# Patient Record
Sex: Male | Born: 1955 | Race: White | Hispanic: No | Marital: Single | Smoking: Current every day smoker
Health system: Southern US, Community
[De-identification: ages and names within clinical notes are randomized; demographics above are authoritative.]

## PROBLEM LIST (undated history)

## (undated) ENCOUNTER — Emergency Department (HOSPITAL_COMMUNITY): Admission: EM | Payer: Self-pay | Source: Home / Self Care

## (undated) DIAGNOSIS — J449 Chronic obstructive pulmonary disease, unspecified: Secondary | ICD-10-CM

## (undated) DIAGNOSIS — E079 Disorder of thyroid, unspecified: Secondary | ICD-10-CM

## (undated) DIAGNOSIS — F419 Anxiety disorder, unspecified: Secondary | ICD-10-CM

## (undated) DIAGNOSIS — B182 Chronic viral hepatitis C: Secondary | ICD-10-CM

## (undated) DIAGNOSIS — F329 Major depressive disorder, single episode, unspecified: Secondary | ICD-10-CM

## (undated) DIAGNOSIS — F32A Depression, unspecified: Secondary | ICD-10-CM

## (undated) DIAGNOSIS — F1020 Alcohol dependence, uncomplicated: Secondary | ICD-10-CM

## (undated) DIAGNOSIS — M419 Scoliosis, unspecified: Secondary | ICD-10-CM

## (undated) DIAGNOSIS — F99 Mental disorder, not otherwise specified: Secondary | ICD-10-CM

## (undated) DIAGNOSIS — Z8719 Personal history of other diseases of the digestive system: Secondary | ICD-10-CM

## (undated) HISTORY — PX: SKIN CANCER EXCISION: SHX779

## (undated) HISTORY — PX: THYROID SURGERY: SHX805

---

## 1998-12-22 ENCOUNTER — Emergency Department (HOSPITAL_COMMUNITY): Admission: EM | Admit: 1998-12-22 | Discharge: 1998-12-22 | Payer: Self-pay | Admitting: Emergency Medicine

## 1998-12-22 ENCOUNTER — Encounter: Payer: Self-pay | Admitting: Emergency Medicine

## 1999-03-16 ENCOUNTER — Emergency Department (HOSPITAL_COMMUNITY): Admission: EM | Admit: 1999-03-16 | Discharge: 1999-03-16 | Payer: Self-pay

## 2000-01-25 ENCOUNTER — Emergency Department (HOSPITAL_COMMUNITY): Admission: EM | Admit: 2000-01-25 | Discharge: 2000-01-25 | Payer: Self-pay | Admitting: Emergency Medicine

## 2000-01-25 ENCOUNTER — Encounter: Payer: Self-pay | Admitting: Emergency Medicine

## 2000-01-27 ENCOUNTER — Encounter: Payer: Self-pay | Admitting: Emergency Medicine

## 2000-01-27 ENCOUNTER — Emergency Department (HOSPITAL_COMMUNITY): Admission: EM | Admit: 2000-01-27 | Discharge: 2000-01-27 | Payer: Self-pay | Admitting: Emergency Medicine

## 2000-06-09 ENCOUNTER — Emergency Department (HOSPITAL_COMMUNITY): Admission: EM | Admit: 2000-06-09 | Discharge: 2000-06-09 | Payer: Self-pay | Admitting: Emergency Medicine

## 2000-06-09 ENCOUNTER — Encounter: Payer: Self-pay | Admitting: Emergency Medicine

## 2001-06-14 ENCOUNTER — Ambulatory Visit (HOSPITAL_COMMUNITY): Admission: RE | Admit: 2001-06-14 | Discharge: 2001-06-14 | Payer: Self-pay | Admitting: Family Medicine

## 2001-06-14 ENCOUNTER — Encounter: Payer: Self-pay | Admitting: Family Medicine

## 2002-07-17 ENCOUNTER — Ambulatory Visit (HOSPITAL_COMMUNITY): Admission: RE | Admit: 2002-07-17 | Discharge: 2002-07-17 | Payer: Self-pay | Admitting: Surgery

## 2002-07-17 ENCOUNTER — Encounter: Payer: Self-pay | Admitting: Surgery

## 2002-08-29 ENCOUNTER — Ambulatory Visit (HOSPITAL_COMMUNITY): Admission: RE | Admit: 2002-08-29 | Discharge: 2002-08-30 | Payer: Self-pay | Admitting: Surgery

## 2002-08-29 ENCOUNTER — Encounter: Payer: Self-pay | Admitting: Surgery

## 2002-08-29 ENCOUNTER — Encounter (INDEPENDENT_AMBULATORY_CARE_PROVIDER_SITE_OTHER): Payer: Self-pay | Admitting: Specialist

## 2003-02-15 ENCOUNTER — Encounter: Payer: Self-pay | Admitting: Emergency Medicine

## 2003-02-15 ENCOUNTER — Emergency Department (HOSPITAL_COMMUNITY): Admission: EM | Admit: 2003-02-15 | Discharge: 2003-02-15 | Payer: Self-pay | Admitting: Emergency Medicine

## 2003-02-26 ENCOUNTER — Emergency Department (HOSPITAL_COMMUNITY): Admission: EM | Admit: 2003-02-26 | Discharge: 2003-02-27 | Payer: Self-pay | Admitting: Emergency Medicine

## 2003-02-26 ENCOUNTER — Encounter: Payer: Self-pay | Admitting: Emergency Medicine

## 2003-05-17 ENCOUNTER — Encounter: Payer: Self-pay | Admitting: Emergency Medicine

## 2003-05-17 ENCOUNTER — Emergency Department (HOSPITAL_COMMUNITY): Admission: EM | Admit: 2003-05-17 | Discharge: 2003-05-17 | Payer: Self-pay | Admitting: Emergency Medicine

## 2004-04-24 ENCOUNTER — Emergency Department (HOSPITAL_COMMUNITY): Admission: EM | Admit: 2004-04-24 | Discharge: 2004-04-24 | Payer: Self-pay | Admitting: Emergency Medicine

## 2005-06-03 ENCOUNTER — Inpatient Hospital Stay (HOSPITAL_COMMUNITY): Admission: EM | Admit: 2005-06-03 | Discharge: 2005-06-07 | Payer: Self-pay | Admitting: Psychiatry

## 2005-06-03 ENCOUNTER — Ambulatory Visit: Payer: Self-pay | Admitting: Psychiatry

## 2005-06-14 ENCOUNTER — Emergency Department (HOSPITAL_COMMUNITY): Admission: EM | Admit: 2005-06-14 | Discharge: 2005-06-14 | Payer: Self-pay | Admitting: *Deleted

## 2006-02-15 ENCOUNTER — Emergency Department (HOSPITAL_COMMUNITY): Admission: EM | Admit: 2006-02-15 | Discharge: 2006-02-15 | Payer: Self-pay | Admitting: Emergency Medicine

## 2006-04-12 ENCOUNTER — Emergency Department (HOSPITAL_COMMUNITY): Admission: EM | Admit: 2006-04-12 | Discharge: 2006-04-12 | Payer: Self-pay | Admitting: Emergency Medicine

## 2006-06-07 ENCOUNTER — Emergency Department (HOSPITAL_COMMUNITY): Admission: EM | Admit: 2006-06-07 | Discharge: 2006-06-07 | Payer: Self-pay | Admitting: Emergency Medicine

## 2008-05-12 ENCOUNTER — Emergency Department (HOSPITAL_COMMUNITY): Admission: EM | Admit: 2008-05-12 | Discharge: 2008-05-13 | Payer: Self-pay | Admitting: Emergency Medicine

## 2008-07-18 ENCOUNTER — Emergency Department (HOSPITAL_COMMUNITY): Admission: EM | Admit: 2008-07-18 | Discharge: 2008-07-18 | Payer: Self-pay | Admitting: Emergency Medicine

## 2009-02-27 ENCOUNTER — Ambulatory Visit: Payer: Self-pay | Admitting: Diagnostic Radiology

## 2009-02-27 ENCOUNTER — Emergency Department (HOSPITAL_BASED_OUTPATIENT_CLINIC_OR_DEPARTMENT_OTHER): Admission: EM | Admit: 2009-02-27 | Discharge: 2009-02-27 | Payer: Self-pay | Admitting: Emergency Medicine

## 2009-11-23 ENCOUNTER — Emergency Department (HOSPITAL_COMMUNITY): Admission: EM | Admit: 2009-11-23 | Discharge: 2009-11-23 | Payer: Self-pay | Admitting: Emergency Medicine

## 2010-04-26 ENCOUNTER — Ambulatory Visit: Payer: Self-pay | Admitting: Psychiatry

## 2010-04-26 ENCOUNTER — Inpatient Hospital Stay (HOSPITAL_COMMUNITY): Admission: AD | Admit: 2010-04-26 | Discharge: 2010-04-30 | Payer: Self-pay | Admitting: Psychiatry

## 2010-04-27 ENCOUNTER — Ambulatory Visit (HOSPITAL_COMMUNITY): Admission: RE | Admit: 2010-04-27 | Discharge: 2010-04-27 | Payer: Self-pay | Admitting: Psychiatry

## 2010-05-07 ENCOUNTER — Encounter: Payer: Self-pay | Admitting: Emergency Medicine

## 2010-05-07 ENCOUNTER — Inpatient Hospital Stay (HOSPITAL_COMMUNITY): Admission: EM | Admit: 2010-05-07 | Discharge: 2010-05-10 | Payer: Self-pay | Admitting: Emergency Medicine

## 2010-08-25 ENCOUNTER — Emergency Department (HOSPITAL_COMMUNITY): Admission: EM | Admit: 2010-08-25 | Discharge: 2010-08-26 | Payer: Self-pay

## 2010-09-23 ENCOUNTER — Emergency Department (HOSPITAL_COMMUNITY): Admission: EM | Admit: 2010-09-23 | Discharge: 2010-04-26 | Payer: Self-pay | Admitting: Emergency Medicine

## 2010-11-08 ENCOUNTER — Emergency Department (HOSPITAL_COMMUNITY)
Admission: EM | Admit: 2010-11-08 | Discharge: 2010-11-09 | Payer: Self-pay | Source: Home / Self Care | Admitting: Emergency Medicine

## 2010-11-09 ENCOUNTER — Inpatient Hospital Stay (HOSPITAL_COMMUNITY)
Admission: AD | Admit: 2010-11-09 | Discharge: 2010-11-15 | Payer: Self-pay | Source: Home / Self Care | Attending: Psychiatry | Admitting: Psychiatry

## 2010-11-09 LAB — COMPREHENSIVE METABOLIC PANEL
Albumin: 3.8 g/dL (ref 3.5–5.2)
BUN: 10 mg/dL (ref 6–23)
Chloride: 106 mEq/L (ref 96–112)
Creatinine, Ser: 1.01 mg/dL (ref 0.4–1.5)
GFR calc Af Amer: >60 mL/min (ref >60)
GFR calc non Af Amer: >60 mL/min (ref >60)
Glucose, Bld: 92 mg/dL (ref 70–99)
Total Bilirubin: 0.7 mg/dL (ref 0.3–1.2)
Total Protein: 6 g/dL (ref 6.0–8.3)

## 2010-11-09 LAB — URINALYSIS, ROUTINE W REFLEX MICROSCOPIC
Bilirubin Urine: NEGATIVE
Ketones, ur: NEGATIVE mg/dL
Specific Gravity, Urine: 1.008 (ref 1.005–1.030)
Urine Glucose, Fasting: NEGATIVE mg/dL
pH: 5.5 (ref 5.0–8.0)

## 2010-11-09 LAB — CBC
HCT: 45.7 % (ref 39.0–52.0)
MCH: 31.6 pg (ref 26.0–34.0)
MCV: 91.4 fL (ref 78.0–100.0)
RBC: 5 MIL/uL (ref 4.22–5.81)

## 2010-11-09 LAB — DIFFERENTIAL
Basophils Relative: 1 % (ref 0–1)
Eosinophils Absolute: 0.1 10*3/uL (ref 0.0–0.7)
Eosinophils Relative: 2 % (ref 0–5)
Lymphocytes Relative: 31 % (ref 12–46)
Lymphs Abs: 2.7 10*3/uL (ref 0.7–4.0)
Monocytes Relative: 4 % (ref 3–12)
Neutro Abs: 5.6 10*3/uL (ref 1.7–7.7)
Neutrophils Relative %: 63 % (ref 43–77)

## 2010-11-09 LAB — RAPID URINE DRUG SCREEN, HOSP PERFORMED
Amphetamines: NOT DETECTED
Barbiturates: NOT DETECTED
Benzodiazepines: NOT DETECTED
Opiates: NOT DETECTED

## 2010-11-15 NOTE — H&P (Signed)
Tim [name], Tim [name]               ACCOUNT NO.:  [id number]  MEDICAL RECORD NO.:  [id number]          PATIENT TYPE:  IPS  LOCATION:  0303                          [facility]ACILITY[facility]  [facility]  PHYSICIAN:  Eulogio Ditch, MD DATE OF BIRTH:  1956/05/08  DATE OF ADMISSION:  11/09/2010 DATE OF DISCHARGE:                      PSYCHIATRIC ADMISSION ASSESSMENT   HISTORY OF PRESENT ILLNESS:  The patient presents to the emergency department with complaints of suicidal thoughts and having a plan to run in front of a car, has also been drinking, states he has cut down on his drinking, drinking now only a couple times a week.  He states that he feels very hopeless.  He is lonely, has no social skills.  Feels nonproductive but he is currently unemployed.  Denies any hallucinations with last drink day of arrival.  PAST PSYCHIATRIC HISTORY:  The patient was here in July 2011 for suicidal thoughts and cocaine use.  Has been on Wellbutrin in the past, reports a side effect of feeling suicidal on medication.  Was a client of the [facility].  SOCIAL HISTORY:  This is a 55 year old single male who lives in [place].  He is currently unemployed, has a DUI and received a DUI in July and has a court date this coming April.  FAMILY HISTORY:  None.  ALCOHOL/DRUG HISTORY:  As above.  Drinking as he states a couple times a week with a blood alcohol of 97.  Denies any seizures or blackouts and has a history of cocaine use but none currently.  Reports occasional marijuana use.  PRIMARY CARE PROVIDER:  Unknown.  MEDICAL PROBLEMS:  A history of chronic obstructive pulmonary disease and positive hepatitis C.  MEDICATIONS: 1. Recently on Prozac 20 mg daily. 2. Advil p.r.n. for pain.  DRUG ALLERGIES:  No known allergies.  Physical exam was done at the emergency department.  This is a normally- developed, disheveled middle-aged male.  He appears in no distress.  No tremors were noted.  Of note, in the  physical exam, there was deformity and swelling over the right [facility] joint, pain with abduction of right arm but still full range of motion.  Neurovascular is intact.  LABORATORY DATA:  Shows a urinalysis that was negative and urine drug screen negative.  Alcohol level of 97.  CMP within normal limits.  CBC within normal limits.    The patient was seen in conjunction with Eulogio Ditch, MD  MENTAL STATUS EXAM:  The patient is fully alert and cooperative, good eye contact, disheveled and appears in no distress.  His speech is clear.  He provides a good history as to his circumstances.  Thought processes are coherent and goal directed.  The patient already has plans to make himself better, to stop drinking and to be compliant with his medications.  Cognitive function intact.  His memory appears intact. Judgment and insight appear good.  AXIS I:  Depressive disorder, rule out substance use, mood disorder. Alcohol abuse, rule out dependence. AXIS II:  Deferred. AXIS III:  History of chronic obstructive pulmonary disease, hepatitis C. AXIS IV:  Problems with occupation, psychosocial problems related to loneliness, medical problems.  AXIS V:  Current is 30.  Our plan is to have Librium available on a short-term basis.  Monitor withdrawal symptoms.  We will continue patient's Prozac and we may need to titrate to optimize symptoms.  Assess the patient's motivation for rehabilitation.  The patient at this time was open to the [facility] and we will encourage group activity.  Currently length of stay of 4-5 days.     Landry Corporal, N.P.   ______________________________ Eulogio Ditch, MD    JO/MEDQ  D:  11/10/2010  T:  11/10/2010  Job:  [id number]  Electronically Signed by Limmie PatriciaP. on 11/15/2010 03:47:28 PM Electronically Signed by Eulogio Ditch  on 11/15/2010 06:24:06 PM

## 2010-11-15 NOTE — Discharge Summary (Signed)
  Tim [name], Tim [name]               ACCOUNT NO.:  [id number]  MEDICAL RECORD NO.:  [id number]          PATIENT TYPE:  IPS  LOCATION:  0303                          [facility]ACILITY:  [facility]  PHYSICIAN:  Eulogio Ditch, MD DATE OF BIRTH:  [birth year]  DATE OF ADMISSION:  11/09/2010 DATE OF DISCHARGE:  11/15/2010                              DISCHARGE SUMMARY   HISTORY OF PRESENT ILLNESS:  A 55 year old male who was admitted because of alcohol abuse.  The patient also reported depressed mood, suicidal thoughts and was feeling hopeless.  He reportedly loneliness.  The patient denied any psychotic or manic symptoms at the time of admission.  PAST PSYCHIATRIC HISTORY:  The patient has been admitted to [facility] in the past in July 2011 for suicidal thoughts and cocaine abuse. The patient also follows at [facility].  SOCIAL HISTORY:  Single male, he lives in [place].  Unemployed.  FAMILY PSYCHIATRIC HISTORY:  None.  ALCOHOL/DRUG ABUSE HISTORY:  The patient has a history of alcohol abuse. At the time of admission, blood alcohol level 97.  Denied any seizure or blackouts.  History of cocaine abuse, but not using currently. Occasional use of marijuana.  MEDICAL PROBLEMS: 1. COPD positive. 2. Hepatitis C.  HOSPITAL COURSE:  During the hospital stay, the patient was started on Librium protocol.  The patient tolerated the protocol well without any side effects.  The patient was taking Prozac in the past, so Prozac 20 mg p.o. daily during the hospital stay was continued.  On Monday, November 15, 2010 when I saw the patient, the patient reported no withdrawal symptoms, feeling much better, not having any suicidal or homicidal ideations.  Was not having psychotic or manic symptoms.  As the patient was psychiatrically stable, he was discharged to follow up in the outpatient setting.  DISCHARGE DIAGNOSES: 1. Polysubstance abuse. 2. Depressive disorder not otherwise  specified. 3. Rule out substance-induced depression. 4. Rule out major depressive disorder recurrent type.  DISCHARGE MEDICATIONS: 1. Prozac 20 mg p.o. daily. 2. The patient was advised to continue his regular medical     medications.  DISCHARGE FOLLOWUP: 1. The patient will follow with [facility], phone number [phone], appointment November 16, 2010     at 11 a.m. 2. The patient with follow with [facility], phone     number [phone].  The patient will call to schedule an appointment.     Eulogio Ditch, MD     SA/MEDQ  D:  11/15/2010  T:  11/15/2010  Job:  [id number]  Electronically Signed by Eulogio Ditch  on 11/15/2010 06:24:11 PM

## 2010-12-28 LAB — DIFFERENTIAL
Basophils Absolute: 0.1 10*3/uL (ref 0.0–0.1)
Eosinophils Absolute: 0.1 10*3/uL (ref 0.0–0.7)
Eosinophils Relative: 1 % (ref 0–5)
Monocytes Absolute: 0.5 10*3/uL (ref 0.1–1.0)

## 2010-12-28 LAB — RAPID URINE DRUG SCREEN, HOSP PERFORMED
Cocaine: NOT DETECTED
Opiates: NOT DETECTED

## 2010-12-28 LAB — BASIC METABOLIC PANEL
Calcium: 9.5 mg/dL (ref 8.4–10.5)
Creatinine, Ser: 1.13 mg/dL (ref 0.4–1.5)
GFR calc Af Amer: >60 mL/min (ref >60)
Sodium: 145 mEq/L (ref 135–145)

## 2010-12-28 LAB — CBC
Hemoglobin: 16 g/dL (ref 13.0–17.0)
MCH: 32.8 pg (ref 26.0–34.0)
MCV: 95.4 fL (ref 78.0–100.0)
RBC: 4.87 MIL/uL (ref 4.22–5.81)

## 2010-12-28 LAB — TRICYCLICS SCREEN, URINE: TCA Scrn: NOT DETECTED

## 2011-01-01 LAB — BENZODIAZEPINE, QUANTITATIVE, URINE
Flurazepam GC/MS Conf: NEGATIVE NG/ML
Lorazepam UR QT: 579 NG/ML — ABNORMAL HIGH
Temazepam GC/MS Conf: NEGATIVE NG/ML

## 2011-01-01 LAB — GLUCOSE, CAPILLARY
Glucose-Capillary: 73 mg/dL (ref 70–99)
Glucose-Capillary: 80 mg/dL (ref 70–99)
Glucose-Capillary: 145 mg/dL — ABNORMAL HIGH (ref 70–99)
Glucose-Capillary: 73 mg/dL (ref 70–99)

## 2011-01-01 LAB — BASIC METABOLIC PANEL
BUN: 13 mg/dL (ref 6–23)
BUN: 3 mg/dL — ABNORMAL LOW (ref 6–23)
CO2: 24 mEq/L (ref 19–32)
CO2: 26 mEq/L (ref 19–32)
Calcium: 8.4 mg/dL (ref 8.4–10.5)
Calcium: 8.7 mg/dL (ref 8.4–10.5)
Calcium: 8.9 mg/dL (ref 8.4–10.5)
Creatinine, Ser: 0.83 mg/dL (ref 0.4–1.5)
Creatinine, Ser: 0.97 mg/dL (ref 0.4–1.5)
GFR calc non Af Amer: >60 mL/min (ref >60)
Glucose, Bld: 120 mg/dL — ABNORMAL HIGH (ref 70–99)
Glucose, Bld: 140 mg/dL — ABNORMAL HIGH (ref 70–99)
Glucose, Bld: 175 mg/dL — ABNORMAL HIGH (ref 70–99)
Potassium: 4 mEq/L (ref 3.5–5.1)
Sodium: 136 mEq/L (ref 135–145)

## 2011-01-01 LAB — CBC
HCT: 40.9 % (ref 39.0–52.0)
HCT: 42.5 % (ref 39.0–52.0)
Hemoglobin: 14.1 g/dL (ref 13.0–17.0)
MCH: 32.6 pg (ref 26.0–34.0)
MCHC: 33.9 g/dL (ref 30.0–36.0)
MCHC: 34.3 g/dL (ref 30.0–36.0)
Platelets: 226 10*3/uL (ref 150–400)
RDW: 13.4 % (ref 11.5–15.5)

## 2011-01-01 LAB — URINE DRUGS OF ABUSE SCREEN W ALC, ROUTINE (REF LAB)
Amphetamine Screen, Ur: NEGATIVE
Benzodiazepines.: POSITIVE — AB
Creatinine,U: 132.5 mg/dL
Propoxyphene: NEGATIVE

## 2011-01-01 LAB — ETHANOL: Alcohol, Ethyl (B): 232 mg/dL — ABNORMAL HIGH (ref 0–10)

## 2011-01-01 LAB — POCT I-STAT, CHEM 8
BUN: 15 mg/dL (ref 6–23)
Calcium, Ion: 1.05 mmol/L — ABNORMAL LOW (ref 1.12–1.32)
Chloride: 108 meq/L (ref 96–112)
Creatinine, Ser: 1.4 mg/dL (ref 0.4–1.5)
Glucose, Bld: 58 mg/dL — ABNORMAL LOW (ref 70–99)
HCT: 48 % (ref 39.0–52.0)
Hemoglobin: 16.3 g/dL (ref 13.0–17.0)
Potassium: 3.2 meq/L — ABNORMAL LOW (ref 3.5–5.1)
Sodium: 138 meq/L (ref 135–145)
TCO2: 18 mmol/L (ref 0–100)

## 2011-01-01 LAB — ETHANOL CONFIRM, URINE: Ethanol, Ur - Confirmation: 0.025 GMS% (ref ?–0.04)

## 2011-01-01 LAB — OPIATE, QUANTITATIVE, URINE
Codeine Urine: NEGATIVE NG/ML
Hydrocodone: NEGATIVE NG/ML
Morphine, Confirm: 1257 NG/ML — ABNORMAL HIGH

## 2011-01-02 LAB — HEPATIC FUNCTION PANEL
ALT: 21 U/L (ref 0–53)
AST: 29 U/L (ref 0–37)
Bilirubin, Direct: 0.1 mg/dL (ref 0.0–0.3)
Total Bilirubin: 0.2 mg/dL — ABNORMAL LOW (ref 0.3–1.2)

## 2011-01-02 LAB — BASIC METABOLIC PANEL WITH GFR
BUN: 9 mg/dL (ref 6–23)
CO2: 23 meq/L (ref 19–32)
Calcium: 9.1 mg/dL (ref 8.4–10.5)
Chloride: 108 meq/L (ref 96–112)
Creatinine, Ser: 1.11 mg/dL (ref 0.4–1.5)
GFR calc non Af Amer: >60 mL/min
Glucose, Bld: 93 mg/dL (ref 70–99)
Potassium: 3.8 meq/L (ref 3.5–5.1)
Sodium: 138 meq/L (ref 135–145)

## 2011-01-02 LAB — CBC
HCT: 46.8 % (ref 39.0–52.0)
Hemoglobin: 16.1 g/dL (ref 13.0–17.0)
MCH: 32.7 pg (ref 26.0–34.0)
MCHC: 34.5 g/dL (ref 30.0–36.0)
MCV: 94.8 fL (ref 78.0–100.0)
Platelets: 207 K/uL (ref 150–400)
RBC: 4.93 MIL/uL (ref 4.22–5.81)
RDW: 13.3 % (ref 11.5–15.5)
WBC: 6.1 K/uL (ref 4.0–10.5)

## 2011-01-02 LAB — DIFFERENTIAL
Basophils Relative: 1 % (ref 0–1)
Eosinophils Absolute: 0.2 10*3/uL (ref 0.0–0.7)
Lymphs Abs: 2.3 10*3/uL (ref 0.7–4.0)
Neutro Abs: 3.3 10*3/uL (ref 1.7–7.7)
Neutrophils Relative %: 54 % (ref 43–77)

## 2011-01-02 LAB — RAPID URINE DRUG SCREEN, HOSP PERFORMED
Barbiturates: NOT DETECTED
Cocaine: POSITIVE — AB
Opiates: NOT DETECTED

## 2011-02-03 ENCOUNTER — Emergency Department (HOSPITAL_COMMUNITY)
Admission: EM | Admit: 2011-02-03 | Discharge: 2011-02-04 | Disposition: A | Discharge status: Home / Self Care | Payer: Self-pay | Attending: Emergency Medicine | Admitting: Emergency Medicine

## 2011-02-03 DIAGNOSIS — M25519 Pain in unspecified shoulder: Secondary | ICD-10-CM | POA: Insufficient documentation

## 2011-02-03 DIAGNOSIS — F329 Major depressive disorder, single episode, unspecified: Secondary | ICD-10-CM | POA: Insufficient documentation

## 2011-02-03 DIAGNOSIS — G8929 Other chronic pain: Secondary | ICD-10-CM | POA: Insufficient documentation

## 2011-02-03 DIAGNOSIS — F3289 Other specified depressive episodes: Secondary | ICD-10-CM | POA: Insufficient documentation

## 2011-02-03 DIAGNOSIS — R45851 Suicidal ideations: Secondary | ICD-10-CM | POA: Insufficient documentation

## 2011-02-03 DIAGNOSIS — J449 Chronic obstructive pulmonary disease, unspecified: Secondary | ICD-10-CM | POA: Insufficient documentation

## 2011-02-03 DIAGNOSIS — M549 Dorsalgia, unspecified: Secondary | ICD-10-CM | POA: Insufficient documentation

## 2011-02-03 DIAGNOSIS — J4489 Other specified chronic obstructive pulmonary disease: Secondary | ICD-10-CM | POA: Insufficient documentation

## 2011-02-03 DIAGNOSIS — B1920 Unspecified viral hepatitis C without hepatic coma: Secondary | ICD-10-CM | POA: Insufficient documentation

## 2011-02-03 LAB — RAPID URINE DRUG SCREEN, HOSP PERFORMED
Opiates: NOT DETECTED
Tetrahydrocannabinol: NOT DETECTED

## 2011-02-03 LAB — DIFFERENTIAL
Lymphocytes Relative: 20 % (ref 12–46)
Lymphs Abs: 1.6 10*3/uL (ref 0.7–4.0)
Neutrophils Relative %: 75 % (ref 43–77)

## 2011-02-03 LAB — COMPREHENSIVE METABOLIC PANEL
ALT: 20 U/L (ref 0–53)
Alkaline Phosphatase: 70 U/L (ref 39–117)
CO2: 23 mEq/L (ref 19–32)
Chloride: 106 mEq/L (ref 96–112)
GFR calc non Af Amer: >60 mL/min (ref >60)
Glucose, Bld: 92 mg/dL (ref 70–99)
Potassium: 3.9 mEq/L (ref 3.5–5.1)
Sodium: 139 mEq/L (ref 135–145)

## 2011-02-03 LAB — CBC
HCT: 44.4 % (ref 39.0–52.0)
MCV: 90.8 fL (ref 78.0–100.0)
RBC: 4.89 MIL/uL (ref 4.22–5.81)
WBC: 7.8 10*3/uL (ref 4.0–10.5)

## 2011-02-04 DIAGNOSIS — F1020 Alcohol dependence, uncomplicated: Secondary | ICD-10-CM
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
## 2011-02-07 ENCOUNTER — Emergency Department (HOSPITAL_COMMUNITY)
Admission: EM | Admit: 2011-02-07 | Discharge: 2011-02-08 | Disposition: A | Discharge status: Home / Self Care | Payer: Self-pay | Attending: Emergency Medicine | Admitting: Emergency Medicine

## 2011-02-07 DIAGNOSIS — F1010 Alcohol abuse, uncomplicated: Secondary | ICD-10-CM | POA: Insufficient documentation

## 2011-02-07 DIAGNOSIS — F341 Dysthymic disorder: Secondary | ICD-10-CM | POA: Insufficient documentation

## 2011-02-07 DIAGNOSIS — F1910 Other psychoactive substance abuse, uncomplicated: Secondary | ICD-10-CM | POA: Insufficient documentation

## 2011-02-07 DIAGNOSIS — Z8619 Personal history of other infectious and parasitic diseases: Secondary | ICD-10-CM | POA: Insufficient documentation

## 2011-02-08 ENCOUNTER — Emergency Department (HOSPITAL_COMMUNITY): Payer: Self-pay

## 2011-02-08 LAB — COMPREHENSIVE METABOLIC PANEL
ALT: 17 U/L (ref 0–53)
AST: 19 U/L (ref 0–37)
BUN: 9 mg/dL (ref 6–23)
GFR calc non Af Amer: >60 mL/min (ref >60)
Potassium: 3.6 mEq/L (ref 3.5–5.1)
Total Bilirubin: 0.3 mg/dL (ref 0.3–1.2)

## 2011-02-08 LAB — RAPID URINE DRUG SCREEN, HOSP PERFORMED: Barbiturates: NOT DETECTED

## 2011-02-08 LAB — DIFFERENTIAL
Basophils Relative: 1 % (ref 0–1)
Eosinophils Absolute: 0.1 10*3/uL (ref 0.0–0.7)
Monocytes Relative: 3 % (ref 3–12)
Neutrophils Relative %: 72 % (ref 43–77)

## 2011-02-08 LAB — CBC
MCH: 32.1 pg (ref 26.0–34.0)
Platelets: 225 10*3/uL (ref 150–400)
RBC: 4.92 MIL/uL (ref 4.22–5.81)

## 2011-02-08 LAB — ETHANOL: Alcohol, Ethyl (B): 147 mg/dL — ABNORMAL HIGH (ref 0–10)
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
## 2011-03-04 NOTE — Op Note (Signed)
NAME:  Tim [name], Tim [name]                         ACCOUNT NO.:  [id number]   MEDICAL RECORD NO.:  [id number]                   PATIENT TYPE:  OIB   LOCATION:  2550                                 FACILITY:  [facility]   PHYSICIAN:  Velora Heckler, M.D.                DATE OF BIRTH:  14-Jun-1956   DATE OF PROCEDURE:  08/29/2002  DATE OF DISCHARGE:                                 OPERATIVE REPORT   PREOPERATIVE DIAGNOSIS:  Primary hyperparathyroidism.   POSTOPERATIVE DIAGNOSIS:  Primary hyperparathyroidism.   OPERATION PERFORMED:  Minimally invasive parathyroidectomy (right inferior  gland).   SURGEON:  Velora Heckler, M.D.   ASSISTANT:  Abigail Miyamoto, M.D.   ANESTHESIA:  General.   ESTIMATED BLOOD LOSS:  Minimal.   PREPARATION:  Betadine.   COMPLICATIONS:  None.   INDICATIONS FOR PROCEDURE:  The patient is a 55 year old white male who  presents at the request of Dr. Barton Fanny from [facility] for evaluation of primary hyperparathyroidism.  Routine laboratory  studies have shown elevated serum calcium levels from 12.1 to 12.8.  Intact  PTH level was elevated at 96.  Sestamibi scan in nuclear medicine  demonstrated increased uptake in the right inferior position consistent with  right inferior parathyroid adenoma.  The patient now comes to surgery for  minimally invasive parathyroidectomy.   DESCRIPTION OF PROCEDURE:  The procedure was done in operating room #2  at  the [facility]. [facility].  The patient was brought to the  operating room and placed in supine position on the operating room table.  Following administration of general anesthesia, the patient was positioned  and then prepped and draped in the usual strict aseptic fashion.  After  ascertaining that an adequate level of anesthesia had been obtained,  the  neck was scanned with the Neoprobe.  A point of increased uptake was noted  in the right inferior neck.  This was marked with a  marking pen.  A 3 cm  incision was then made with a #15 blade and carried down through the  platysma.  Hemostasis was obtained with the electrocautery.  Skin flaps were  developed cephalad and caudad.  The midline was identified and incised.  Strap muscles were reflected laterally.  The thyroid gland was rotated  medially.  Beginning dissection at the inferior pole of the thyroid, the  Neoprobe was used to localize the increased radioactivity.  The inferior  parathyroid gland was then identified.  Vascular tributaries were divided  between small and medium Ligaclips.  The gland was rolled up and out of the  tracheoesophageal groove.  The gland appears to be connected to the  thyrothymic tract.  This was transected and suture ligated with a 3-0 Vicryl  suture ligature.  The gland was completely excised.  Ex vivo scanning shows  it to have increased radioactivity of approximately 80% of background  levels.  The gland measures approximately 1.5 cm in greatest dimension.  Gland was submitted fresh to pathology where a frozen section analysis by  Dr. Tammi Sou confirmed parathyroid tissue consistent with parathyroid  adenoma.  Good hemostasis was noted.  A small piece of Surgicel was placed  in the tracheoesophageal groove in the right neck.  Strap muscles were  reapproximated in the midline with interrupted 3-0 sutures.  The platysma  was reapproximated with interrupted 3-0 Vicryl sutures.  Skin edges were  anesthetized with 0.5% Marcaine local anesthetic.  Skin edges were  reapproximated with interrupted 4-0 Vicryl subcuticular sutures.  The wound  was washed and dried and benzoin and Steri-Strips were applied.  Sterile  gauze dressings were applied.  The patient was awakened from anesthesia and  brought to the recovery room in stable condition.  The patient tolerated the  procedure well.                                                 Velora Heckler, M.D.    TMG/MEDQ  D:   08/29/2002  T:  08/29/2002  Job:  [id number]   cc:   Fanny Dance. Rankins, M.D.  [address] [place]  [place] [address]  Fax: [phone]

## 2011-03-04 NOTE — Discharge Summary (Signed)
NAMEHARLIN, Tim [name]               ACCOUNT NO.:  [id number]   MEDICAL RECORD NO.:  [id number]          PATIENT TYPE:  IPS   LOCATION:  0500                          FACILITY:  [facility]   PHYSICIAN:  Anselm Jungling, MD  DATE OF BIRTH:  27-Jul-1956   DATE OF ADMISSION:  06/03/2005  DATE OF DISCHARGE:  06/07/2005                                 DISCHARGE SUMMARY   IDENTIFYING DATA AND REASON FOR ADMISSION:  This was a 55 year old Caucasian  male admitted for chronic alcoholism, marijuana and cocaine abuse.  He had  also been significantly depressed, with weight loss.  Please refer to the  admission note for further details pertaining to the symptoms,  circumstances, and history that led to his hospitalization at [facility].   INITIAL DIAGNOSTIC IMPRESSION:  AXIS I:  Polysubstance abuse/dependence,  depressive disorder not otherwise specified, and rule out substance-induced  mood disorder.  AXIS II:  Deferred.  AXIS III:  No acute or chronic illnesses.  AXIS IV:  Stressors, severe.  AXIS V:  Global assessment of function on admission 35.   MEDICAL AND LABORATORY:  There were no significant medical issues during  this brief inpatient psychiatric stay.  The patient's admission CBC and  chemistry panel were essentially within normal limits, although total  protein and albumin were slightly under range.  A TSH level was within  normal limits.  An RPR was nonreactive.  A GC/CT probe of urine was  negative.   HOSPITAL COURSE:  The patient was admitted to the adult inpatient  psychiatric service.  He was placed on a regimen that included his usual  Lexapro 10 mg daily.  In addition, he was placed on an increasing schedule  of Seroquel, 100 mg q.h.s., to be increased daily by 100 mg.  This was well  tolerated.  At the time of discharge he was up to Seroquel 300 mg q.h.s.  which was perceived by the patient and staff to be helpful.   The patient was discharged on the fifth hospital day.  At that time  he was  in good spirits, and absent of any suicidal ideation.  He was agreeable to  discharge at that time.   AFTERCARE PLANS:  The patient was discharged on Lexapro 10 mg daily and  Seroquel 300 mg q.h.s.  He was to follow up with Dr. Lang Snow at the [facility] on June 09, 2005.   DISCHARGE DIAGNOSES:  AXIS I:  Polysubstance abuse and depressive disorder  not otherwise specified.  AXIS II:  Deferred.  AXIS III:  No acute or chronic illnesses.  AXIS IV:  Stressors, severe.  AXIS V:  Global assessment of function on discharge 55.           ______________________________  Anselm Jungling, MD  Electronically Signed     SPB/MEDQ  D:  06/30/2005  T:  06/30/2005  Job:  [id number]

## 2011-05-27 ENCOUNTER — Emergency Department (HOSPITAL_COMMUNITY)
Admission: EM | Admit: 2011-05-27 | Discharge: 2011-05-28 | Disposition: A | Discharge status: Other Acute Inpatient Hospital | Payer: Self-pay | Attending: Emergency Medicine | Admitting: Emergency Medicine

## 2011-05-27 DIAGNOSIS — M549 Dorsalgia, unspecified: Secondary | ICD-10-CM | POA: Insufficient documentation

## 2011-05-27 DIAGNOSIS — F3289 Other specified depressive episodes: Secondary | ICD-10-CM | POA: Insufficient documentation

## 2011-05-27 DIAGNOSIS — F329 Major depressive disorder, single episode, unspecified: Secondary | ICD-10-CM | POA: Insufficient documentation

## 2011-05-27 DIAGNOSIS — R45851 Suicidal ideations: Secondary | ICD-10-CM | POA: Insufficient documentation

## 2011-05-27 LAB — COMPREHENSIVE METABOLIC PANEL
ALT: 16 U/L (ref 0–53)
Calcium: 9.8 mg/dL (ref 8.4–10.5)
GFR calc Af Amer: >60 mL/min (ref >60)
Glucose, Bld: 83 mg/dL (ref 70–99)
Sodium: 139 mEq/L (ref 135–145)
Total Protein: 7.9 g/dL (ref 6.0–8.3)

## 2011-05-27 LAB — URINALYSIS, ROUTINE W REFLEX MICROSCOPIC
Bilirubin Urine: NEGATIVE
Glucose, UA: NEGATIVE mg/dL
Hgb urine dipstick: NEGATIVE
Ketones, ur: NEGATIVE mg/dL
Leukocytes, UA: NEGATIVE
pH: 5 (ref 5.0–8.0)

## 2011-05-27 LAB — CBC
HCT: 45.5 % (ref 39.0–52.0)
Hemoglobin: 16.2 g/dL (ref 13.0–17.0)
MCH: 33.1 pg (ref 26.0–34.0)
MCV: 92.9 fL (ref 78.0–100.0)
RBC: 4.9 MIL/uL (ref 4.22–5.81)

## 2011-05-27 LAB — ETHANOL: Alcohol, Ethyl (B): 218 mg/dL — ABNORMAL HIGH (ref 0–11)

## 2011-05-27 LAB — RAPID URINE DRUG SCREEN, HOSP PERFORMED
Amphetamines: NOT DETECTED
Benzodiazepines: NOT DETECTED
Tetrahydrocannabinol: POSITIVE — AB

## 2011-05-27 LAB — DIFFERENTIAL
Lymphocytes Relative: 29 % (ref 12–46)
Lymphs Abs: 2.1 10*3/uL (ref 0.7–4.0)
Monocytes Absolute: 0.4 10*3/uL (ref 0.1–1.0)
Monocytes Relative: 5 % (ref 3–12)
Neutro Abs: 4.4 10*3/uL (ref 1.7–7.7)
Neutrophils Relative %: 59 % (ref 43–77)

## 2011-05-28 ENCOUNTER — Inpatient Hospital Stay (HOSPITAL_COMMUNITY)
Admission: AD | Admit: 2011-05-28 | Discharge: 2011-06-02 | DRG: 897 | Disposition: A | Discharge status: Home / Self Care | Payer: Self-pay | Source: Ambulatory Visit | Attending: Psychiatry | Admitting: Psychiatry

## 2011-05-28 DIAGNOSIS — F329 Major depressive disorder, single episode, unspecified: Secondary | ICD-10-CM

## 2011-05-28 DIAGNOSIS — F1020 Alcohol dependence, uncomplicated: Principal | ICD-10-CM

## 2011-05-28 DIAGNOSIS — F1994 Other psychoactive substance use, unspecified with psychoactive substance-induced mood disorder: Secondary | ICD-10-CM

## 2011-05-28 DIAGNOSIS — J4489 Other specified chronic obstructive pulmonary disease: Secondary | ICD-10-CM

## 2011-05-28 DIAGNOSIS — F1910 Other psychoactive substance abuse, uncomplicated: Secondary | ICD-10-CM

## 2011-05-28 DIAGNOSIS — M549 Dorsalgia, unspecified: Secondary | ICD-10-CM

## 2011-05-28 DIAGNOSIS — R45851 Suicidal ideations: Secondary | ICD-10-CM

## 2011-05-28 DIAGNOSIS — J449 Chronic obstructive pulmonary disease, unspecified: Secondary | ICD-10-CM

## 2011-05-28 DIAGNOSIS — F17200 Nicotine dependence, unspecified, uncomplicated: Secondary | ICD-10-CM

## 2011-05-28 DIAGNOSIS — Z733 Stress, not elsewhere classified: Secondary | ICD-10-CM

## 2011-05-28 DIAGNOSIS — F3289 Other specified depressive episodes: Secondary | ICD-10-CM

## 2011-05-28 DIAGNOSIS — B1920 Unspecified viral hepatitis C without hepatic coma: Secondary | ICD-10-CM
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
## 2011-07-14 ENCOUNTER — Emergency Department (HOSPITAL_COMMUNITY)
Admission: EM | Admit: 2011-07-14 | Discharge: 2011-07-14 | Disposition: A | Discharge status: Home / Self Care | Payer: Self-pay | Attending: Emergency Medicine | Admitting: Emergency Medicine

## 2011-07-14 DIAGNOSIS — F1010 Alcohol abuse, uncomplicated: Secondary | ICD-10-CM | POA: Insufficient documentation

## 2011-07-14 DIAGNOSIS — F3289 Other specified depressive episodes: Secondary | ICD-10-CM | POA: Insufficient documentation

## 2011-07-14 DIAGNOSIS — Z139 Encounter for screening, unspecified: Secondary | ICD-10-CM | POA: Insufficient documentation

## 2011-07-14 DIAGNOSIS — F411 Generalized anxiety disorder: Secondary | ICD-10-CM | POA: Insufficient documentation

## 2011-07-14 DIAGNOSIS — R45851 Suicidal ideations: Secondary | ICD-10-CM | POA: Insufficient documentation

## 2011-07-14 DIAGNOSIS — F329 Major depressive disorder, single episode, unspecified: Secondary | ICD-10-CM | POA: Insufficient documentation

## 2011-07-14 DIAGNOSIS — R45850 Homicidal ideations: Secondary | ICD-10-CM | POA: Insufficient documentation

## 2011-07-14 LAB — CBC
MCH: 31.5 pg (ref 26.0–34.0)
MCV: 89.2 fL (ref 78.0–100.0)
Platelets: 224 10*3/uL (ref 150–400)
RDW: 13.2 % (ref 11.5–15.5)

## 2011-07-14 LAB — DIFFERENTIAL
Eosinophils Absolute: 1 10*3/uL — ABNORMAL HIGH (ref 0.0–0.7)
Eosinophils Relative: 11 % — ABNORMAL HIGH (ref 0–5)
Lymphs Abs: 2.8 10*3/uL (ref 0.7–4.0)
Monocytes Relative: 4 % (ref 3–12)

## 2011-07-14 LAB — COMPREHENSIVE METABOLIC PANEL
AST: 21 U/L (ref 0–37)
Albumin: 3.8 g/dL (ref 3.5–5.2)
Calcium: 9.5 mg/dL (ref 8.4–10.5)
Creatinine, Ser: 1.01 mg/dL (ref 0.50–1.35)
GFR calc non Af Amer: >60 mL/min (ref >60)

## 2011-07-14 LAB — RAPID URINE DRUG SCREEN, HOSP PERFORMED
Benzodiazepines: NOT DETECTED
Cocaine: NOT DETECTED

## 2011-07-22 ENCOUNTER — Encounter: Payer: Self-pay | Admitting: *Deleted

## 2011-07-22 ENCOUNTER — Emergency Department (HOSPITAL_COMMUNITY)
Admission: EM | Admit: 2011-07-22 | Discharge: 2011-07-22 | Disposition: A | Discharge status: Home / Self Care | Payer: Self-pay | Attending: Emergency Medicine | Admitting: Emergency Medicine

## 2011-07-22 DIAGNOSIS — F1010 Alcohol abuse, uncomplicated: Secondary | ICD-10-CM

## 2011-07-22 DIAGNOSIS — R45850 Homicidal ideations: Secondary | ICD-10-CM | POA: Insufficient documentation

## 2011-07-22 DIAGNOSIS — T148XXA Other injury of unspecified body region, initial encounter: Secondary | ICD-10-CM

## 2011-07-22 DIAGNOSIS — IMO0002 Reserved for concepts with insufficient information to code with codable children: Secondary | ICD-10-CM | POA: Insufficient documentation

## 2011-07-22 DIAGNOSIS — F17200 Nicotine dependence, unspecified, uncomplicated: Secondary | ICD-10-CM | POA: Insufficient documentation

## 2011-07-22 HISTORY — DX: Chronic viral hepatitis C: B18.2

## 2011-07-22 HISTORY — DX: Chronic obstructive pulmonary disease, unspecified: J44.9

## 2011-07-22 LAB — ETHANOL: Alcohol, Ethyl (B): 213 mg/dL — ABNORMAL HIGH (ref 0–11)

## 2011-07-22 NOTE — ED Notes (Signed)
Pt up to bathroom.
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
## 2011-07-22 NOTE — ED Notes (Signed)
Cleaned patient's wounds
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
## 2011-10-15 ENCOUNTER — Encounter (HOSPITAL_COMMUNITY): Payer: Self-pay | Admitting: Emergency Medicine

## 2011-10-15 ENCOUNTER — Emergency Department (HOSPITAL_COMMUNITY)
Admission: EM | Admit: 2011-10-15 | Discharge: 2011-10-15 | Disposition: A | Discharge status: Home / Self Care | Payer: Self-pay | Attending: Emergency Medicine | Admitting: Emergency Medicine

## 2011-10-15 DIAGNOSIS — F10929 Alcohol use, unspecified with intoxication, unspecified: Secondary | ICD-10-CM

## 2011-10-15 DIAGNOSIS — F1010 Alcohol abuse, uncomplicated: Secondary | ICD-10-CM | POA: Insufficient documentation

## 2011-10-15 DIAGNOSIS — F17200 Nicotine dependence, unspecified, uncomplicated: Secondary | ICD-10-CM | POA: Insufficient documentation

## 2011-10-15 DIAGNOSIS — Z8619 Personal history of other infectious and parasitic diseases: Secondary | ICD-10-CM | POA: Insufficient documentation

## 2011-10-15 LAB — BASIC METABOLIC PANEL
BUN: 16 mg/dL (ref 6–23)
CO2: 20 mEq/L (ref 19–32)
Chloride: 106 mEq/L (ref 96–112)
GFR calc non Af Amer: 74 mL/min — ABNORMAL LOW (ref >90)
Glucose, Bld: 71 mg/dL (ref 70–99)
Potassium: 3.7 mEq/L (ref 3.5–5.1)
Sodium: 140 mEq/L (ref 135–145)

## 2011-10-15 LAB — CBC
HCT: 43.2 % (ref 39.0–52.0)
Hemoglobin: 15 g/dL (ref 13.0–17.0)
MCHC: 34.7 g/dL (ref 30.0–36.0)
RBC: 4.91 MIL/uL (ref 4.22–5.81)

## 2011-10-15 NOTE — ED Notes (Addendum)
Pt asleep. Appears to be comfortable. Pt calm and resting.
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
## 2011-10-15 NOTE — ED Notes (Signed)
[id number]<GL> Expected date:10/15/11<BR> Expected time: 2:13 AM<BR> Means of arrival:Ambulance<BR> Comments:<BR> Etoh, pysche evaluation
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
## 2011-10-15 NOTE — ED Notes (Signed)
4 packs of cigarettes, 1 lighter, 1 cell phone,  1 pair of jeans, 1 jacket, 1 wallet, 1 pair of underwear, 2 shirts, 1 pair of socks, 1 pair of boots, 1 cell phone case, 1 glove. 2 bags of belongings located behind nurses station.

## 2011-10-15 NOTE — ED Notes (Signed)
Pt. In blue scrubs, red socks. pt. And belongings have both be wanded by security.

## 2011-10-18 ENCOUNTER — Encounter (HOSPITAL_COMMUNITY): Payer: Self-pay | Admitting: Emergency Medicine

## 2011-10-18 ENCOUNTER — Emergency Department (HOSPITAL_COMMUNITY)
Admission: EM | Admit: 2011-10-18 | Discharge: 2011-10-18 | Disposition: A | Discharge status: Home / Self Care | Payer: Self-pay | Attending: Emergency Medicine | Admitting: Emergency Medicine

## 2011-10-18 DIAGNOSIS — Z79899 Other long term (current) drug therapy: Secondary | ICD-10-CM | POA: Insufficient documentation

## 2011-10-18 DIAGNOSIS — J449 Chronic obstructive pulmonary disease, unspecified: Secondary | ICD-10-CM | POA: Insufficient documentation

## 2011-10-18 DIAGNOSIS — F1020 Alcohol dependence, uncomplicated: Secondary | ICD-10-CM | POA: Insufficient documentation

## 2011-10-18 DIAGNOSIS — F919 Conduct disorder, unspecified: Secondary | ICD-10-CM | POA: Insufficient documentation

## 2011-10-18 DIAGNOSIS — F17200 Nicotine dependence, unspecified, uncomplicated: Secondary | ICD-10-CM | POA: Insufficient documentation

## 2011-10-18 DIAGNOSIS — J4489 Other specified chronic obstructive pulmonary disease: Secondary | ICD-10-CM | POA: Insufficient documentation

## 2011-10-18 DIAGNOSIS — IMO0002 Reserved for concepts with insufficient information to code with codable children: Secondary | ICD-10-CM

## 2011-10-18 DIAGNOSIS — Z8619 Personal history of other infectious and parasitic diseases: Secondary | ICD-10-CM | POA: Insufficient documentation

## 2011-10-18 HISTORY — DX: Mental disorder, not otherwise specified: F99

## 2011-10-18 NOTE — ED Notes (Signed)
Patient states he is here voluntarily to be admitted for a psychiatric evaluation. States he is not a violent person but is having violent thoughts. States he has been going through "highs and lows and does not know what to do". Denies suicidal ideations or homicidal ideations.
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
## 2011-10-18 NOTE — ED Notes (Signed)
Per night shift nurse, pt has discharge paper work. Is to be discharged home past eating breakfast

## 2011-10-18 NOTE — ED Provider Notes (Signed)
History     CSN: [id number]  Arrival date & time 10/18/11  4782   First MD Initiated Contact with Patient 10/18/11 [phone]      Chief Complaint  Patient presents with  . Psychiatric Evaluation    (Consider location/radiation/quality/duration/timing/severity/associated sxs/prior treatment) HPI Comments: 56 year old male with a history of COPD, hepatitis C, mental health disorders, states that he has had a whole life time of not being able to make good decisions. He states that this is been a chronic problem and the only time that he ever does well is when someone tells him what to do all the time. When he makes his own decisions he says that he "always makes the wrong ones" he denies feeling physically bad but admits to intermittent depression. He denies suicidal thoughts, homicidal thoughts and has no violent feelings towards himself or anyone. He is currently receiving treatment for his mental health problems at the [facility] in [place] and has been treated at [facility] in the past several months. He does have chronic alcoholism and admits to drinking 3-24 ounce beers yesterday and smoking marijuana. He denies crack cocaine, heroin.  The history is provided by the patient and medical records.    Past Medical History  Diagnosis Date  . COPD (chronic obstructive pulmonary disease)   . Hep C w/o coma, chronic   . Mental health disorder     Past Surgical History  Procedure Date  . Thyroid surgery   . Skin cancer excision     History reviewed. No pertinent family history.  History  Substance Use Topics  . Smoking status: Current Everyday Smoker -- 1.0 packs/day for 40 years    Types: Cigarettes  . Smokeless tobacco: Not on file  . Alcohol Use: Yes      Review of Systems  All other systems reviewed and are negative.    Allergies  Review of patient's allergies indicates no known allergies.  Home Medications   Current Outpatient Rx  Name Route Sig Dispense Refill    . FLUOXETINE HCL 40 MG PO CAPS Oral Take 40 mg by mouth daily.      Marland Kitchen HYDROXYZINE PAMOATE 25 MG PO CAPS Oral Take 25 mg by mouth 3 (three) times daily as needed.        BP 102/68  Pulse 64  Temp(Src) 97.9 F (36.6 C) (Oral)  Resp 18  Ht 6\' 3"  (1.905 m)  Wt 189 lb (85.73 kg)  BMI 23.62 kg/m2  SpO2 100%  Physical Exam  Nursing note and vitals reviewed. Constitutional: He appears well-developed and well-nourished. No distress.  HENT:  Head: Normocephalic and atraumatic.  Mouth/Throat: Oropharynx is clear and moist. No oropharyngeal exudate.  Eyes: Conjunctivae and EOM are normal. Pupils are equal, round, and reactive to light. Right eye exhibits no discharge. Left eye exhibits no discharge. No scleral icterus.  Neck: Normal range of motion. Neck supple. No JVD present. No thyromegaly present.  Cardiovascular: Normal rate, regular rhythm, normal heart sounds and intact distal pulses.  Exam reveals no gallop and no friction rub.   No murmur heard. Pulmonary/Chest: Effort normal and breath sounds normal. No respiratory distress. He has no wheezes. He has no rales.  Abdominal: Soft. Bowel sounds are normal. He exhibits no distension and no mass. There is no tenderness.  Musculoskeletal: Normal range of motion. He exhibits no edema and no tenderness.  Lymphadenopathy:    He has no cervical adenopathy.  Neurological: He is alert. Coordination normal.  Skin: Skin  is warm and dry. No rash noted. No erythema.  Psychiatric: He has a normal mood and affect. His behavior is normal.    ED Course  Procedures (including critical care time)  Labs Reviewed - No data to display No results found.   1. Behavioral disorder       MDM  Patient appears in good health in good spirits, he is able to smile and lap and states that he still finds in joint and in certain things in life. He denies suicidal thoughts, hallucinations or any violent behavior or thoughts. Patient would be stable for  discharge and followup with psychiatry as an outpatient. He is already aware and has been seen in the past by local psychiatrist with [facility] as well as psychiatrists at the [facility]  Patient is well-appearing, he does not pose a threat to himself or others and is willing to followup with [facility]. Phone numbers given, questions answered.      Vida Roller, MD 10/18/11 [phone]

## 2012-01-26 ENCOUNTER — Encounter (HOSPITAL_COMMUNITY): Payer: Self-pay | Admitting: *Deleted

## 2012-01-26 ENCOUNTER — Emergency Department (HOSPITAL_COMMUNITY)
Admission: EM | Admit: 2012-01-26 | Discharge: 2012-01-27 | Disposition: A | Discharge status: Rehabilitation Facility | Payer: Self-pay | Attending: Emergency Medicine | Admitting: Emergency Medicine

## 2012-01-26 DIAGNOSIS — B182 Chronic viral hepatitis C: Secondary | ICD-10-CM | POA: Insufficient documentation

## 2012-01-26 DIAGNOSIS — F1410 Cocaine abuse, uncomplicated: Secondary | ICD-10-CM | POA: Insufficient documentation

## 2012-01-26 DIAGNOSIS — F17200 Nicotine dependence, unspecified, uncomplicated: Secondary | ICD-10-CM | POA: Insufficient documentation

## 2012-01-26 DIAGNOSIS — F1210 Cannabis abuse, uncomplicated: Secondary | ICD-10-CM | POA: Insufficient documentation

## 2012-01-26 DIAGNOSIS — J4489 Other specified chronic obstructive pulmonary disease: Secondary | ICD-10-CM | POA: Insufficient documentation

## 2012-01-26 DIAGNOSIS — J449 Chronic obstructive pulmonary disease, unspecified: Secondary | ICD-10-CM | POA: Insufficient documentation

## 2012-01-26 DIAGNOSIS — F1010 Alcohol abuse, uncomplicated: Secondary | ICD-10-CM | POA: Insufficient documentation

## 2012-01-26 LAB — DIFFERENTIAL
Basophils Relative: 1 % (ref 0–1)
Eosinophils Absolute: 0.3 10*3/uL (ref 0.0–0.7)
Eosinophils Relative: 3 % (ref 0–5)
Lymphs Abs: 2.8 10*3/uL (ref 0.7–4.0)
Neutrophils Relative %: 59 % (ref 43–77)

## 2012-01-26 LAB — URINALYSIS, ROUTINE W REFLEX MICROSCOPIC
Glucose, UA: NEGATIVE mg/dL
Hgb urine dipstick: NEGATIVE
Leukocytes, UA: NEGATIVE
Specific Gravity, Urine: 1.014 (ref 1.005–1.030)
pH: 5 (ref 5.0–8.0)

## 2012-01-26 LAB — CBC
MCH: 31.2 pg (ref 26.0–34.0)
MCHC: 35 g/dL (ref 30.0–36.0)
MCV: 89 fL (ref 78.0–100.0)
Platelets: 238 10*3/uL (ref 150–400)
RBC: 4.62 MIL/uL (ref 4.22–5.81)

## 2012-01-26 NOTE — ED Provider Notes (Signed)
History     CSN: [id number]  Arrival date & time 01/26/12  2320   First MD Initiated Contact with Patient 01/26/12 2349      Chief Complaint  Patient presents with  . Psychiatric Evaluation    (Consider location/radiation/quality/duration/timing/severity/associated sxs/prior treatment) HPI Comments: 56 year old alcoholic male who presents with request for alcohol detox, the patient also has a history of hepatitis C and COPD. He states he's been drinking daily for many months, has been in and out of detoxification facilities with minimal improvement and contributes this to his living situation around other people who drink heavily and use drugs. He denies any depression or suicidality, currently lives with his mother but feels that he may inherited some of his alcoholic tendencies from his deceased father who was also an alcoholic. He states that he does not have alcohol withdrawal seizures and has never had delirium tremens. He does get shaky when he doesn't drink.  His last drink was approximately 2 hours prior to arrival and states he drank approximately 100 ounces of beer today  The history is provided by the patient and medical records.    Past Medical History  Diagnosis Date  . COPD (chronic obstructive pulmonary disease)   . Hep C w/o coma, chronic   . Mental health disorder     Past Surgical History  Procedure Date  . Thyroid surgery   . Skin cancer excision     No family history on file.  History  Substance Use Topics  . Smoking status: Current Everyday Smoker -- 1.0 packs/day for 40 years    Types: Cigarettes  . Smokeless tobacco: Not on file  . Alcohol Use: Yes      Review of Systems  Constitutional: Negative for fever and chills.  HENT: Negative for sore throat and neck pain.   Eyes: Negative for visual disturbance.  Respiratory: Negative for cough and shortness of breath.   Cardiovascular: Negative for chest pain.  Gastrointestinal: Negative for nausea,  vomiting, abdominal pain and diarrhea.  Genitourinary: Negative for dysuria and frequency.  Musculoskeletal: Negative for back pain.  Skin: Negative for rash.  Neurological: Negative for weakness, numbness and headaches.  Hematological: Negative for adenopathy.  Psychiatric/Behavioral: Negative for behavioral problems.    Allergies  Review of patient's allergies indicates no known allergies.  Home Medications   Current Outpatient Rx  Name Route Sig Dispense Refill  . FLUOXETINE HCL 40 MG PO CAPS Oral Take 40 mg by mouth daily.      Marland Kitchen HYDROXYZINE PAMOATE 25 MG PO CAPS Oral Take 25 mg by mouth 3 (three) times daily as needed.        BP 92/65  Pulse 77  Temp(Src) 98.1 F (36.7 C) (Oral)  Resp 16  SpO2 93%  Physical Exam  Nursing note and vitals reviewed. Constitutional: He appears well-developed and well-nourished. No distress.  HENT:  Head: Normocephalic and atraumatic.  Mouth/Throat: Oropharynx is clear and moist. No oropharyngeal exudate.  Eyes: Conjunctivae and EOM are normal. Pupils are equal, round, and reactive to light. Right eye exhibits no discharge. Left eye exhibits no discharge. No scleral icterus.  Neck: Normal range of motion. Neck supple. No JVD present. No thyromegaly present.  Cardiovascular: Normal rate, regular rhythm, normal heart sounds and intact distal pulses.  Exam reveals no gallop and no friction rub.   No murmur heard. Pulmonary/Chest: Effort normal and breath sounds normal. No respiratory distress. He has no wheezes. He has no rales.  Abdominal: Soft. Bowel sounds  are normal. He exhibits no distension and no mass. There is no tenderness.  Musculoskeletal: Normal range of motion. He exhibits no edema and no tenderness.  Lymphadenopathy:    He has no cervical adenopathy.  Neurological: He is alert. Coordination normal.  Skin: Skin is warm and dry. No rash noted. No erythema.  Psychiatric: He has a normal mood and affect. His behavior is normal.     ED Course  Procedures (including critical care time)  Labs Reviewed  URINE RAPID DRUG SCREEN (HOSP PERFORMED) - Abnormal; Notable for the following:    Cocaine POSITIVE (*)    Tetrahydrocannabinol POSITIVE (*)    All other components within normal limits  COMPREHENSIVE METABOLIC PANEL - Abnormal; Notable for the following:    GFR calc non Af Amer 61 (*)    GFR calc Af Amer 71 (*)    All other components within normal limits  ETHANOL - Abnormal; Notable for the following:    Alcohol, Ethyl (B) 101 (*)    All other components within normal limits  URINALYSIS, ROUTINE W REFLEX MICROSCOPIC  CBC  DIFFERENTIAL   No results found.   1. Alcohol abuse       MDM  Patient appears in severe, states that he does feel more comfortable when he is institutionalized because of "I don't have to make my own decisions and I don't have to take care of myself"  does not appear to be in withdrawal has no tremor and has a normal mental status, is not depressed or suicidal.  Pt has been accepted to [facility], MD 01/27/12 [phone]

## 2012-01-26 NOTE — ED Notes (Signed)
The pt is here to be detoxed from alcohol.  He has been drinking all his life..  His last alcohol was 2100

## 2012-01-27 LAB — RAPID URINE DRUG SCREEN, HOSP PERFORMED
Amphetamines: NOT DETECTED
Benzodiazepines: NOT DETECTED
Cocaine: POSITIVE — AB
Opiates: NOT DETECTED
Tetrahydrocannabinol: POSITIVE — AB

## 2012-01-27 LAB — ETHANOL: Alcohol, Ethyl (B): 101 mg/dL — ABNORMAL HIGH (ref 0–11)

## 2012-01-27 LAB — COMPREHENSIVE METABOLIC PANEL
Albumin: 3.9 g/dL (ref 3.5–5.2)
Alkaline Phosphatase: 82 U/L (ref 39–117)
BUN: 18 mg/dL (ref 6–23)
Calcium: 9.5 mg/dL (ref 8.4–10.5)
GFR calc Af Amer: 71 mL/min — ABNORMAL LOW (ref >90)
Glucose, Bld: 85 mg/dL (ref 70–99)
Potassium: 4 mEq/L (ref 3.5–5.1)
Sodium: 138 mEq/L (ref 135–145)
Total Protein: 6.6 g/dL (ref 6.0–8.3)

## 2012-01-27 NOTE — ED Notes (Signed)
Pt. To be d/c'd at 11 am. [facility] to pick him up at that time.
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
## 2012-01-27 NOTE — Discharge Instructions (Signed)
Alcohol Problems Most adults who drink alcohol drink in moderation (not a lot) are at low risk for developing problems related to their drinking. However, all drinkers, including low-risk drinkers, should know about the health risks connected with drinking alcohol. RECOMMENDATIONS FOR LOW-RISK DRINKING  Drink in moderation. Moderate drinking is defined as follows:   Men - no more than 2 drinks per day.   Nonpregnant women - no more than 1 drink per day.   Over age 54 - no more than 1 drink per day.  A standard drink is 12 grams of pure alcohol, which is equal to a 12 ounce bottle of beer or wine cooler, a 5 ounce glass of wine, or 1.5 ounces of distilled spirits (such as whiskey, brandy, vodka, or rum).  ABSTAIN FROM (DO NOT DRINK) ALCOHOL:  When pregnant or considering pregnancy.   When taking a medication that interacts with alcohol.   If you are alcohol dependent.   A medical condition that prohibits drinking alcohol (such as ulcer, liver disease, or heart disease).  DISCUSS WITH YOUR CAREGIVER:  If you are at risk for coronary heart disease, discuss the potential benefits and risks of alcohol use: Light to moderate drinking is associated with lower rates of coronary heart disease in certain populations (for example, men over age 54 and postmenopausal women). Infrequent or nondrinkers are advised not to begin light to moderate drinking to reduce the risk of coronary heart disease so as to avoid creating an alcohol-related problem. Similar protective effects can likely be gained through proper diet and exercise.   Women and the elderly have smaller amounts of body water than men. As a result women and the elderly achieve a higher blood alcohol concentration after drinking the same amount of alcohol.   Exposing a fetus to alcohol can cause a broad range of birth defects referred to as Fetal Alcohol Syndrome (FAS) or Alcohol-Related Birth Defects (ARBD). Although FAS/ARBD is connected with  excessive alcohol consumption during pregnancy, studies also have reported neurobehavioral problems in infants born to mothers reporting drinking an average of 1 drink per day during pregnancy.   Heavier drinking (the consumption of more than 4 drinks per occasion by men and more than 3 drinks per occasion by women) impairs learning (cognitive) and psychomotor functions and increases the risk of alcohol-related problems, including accidents and injuries.  CAGE QUESTIONS:   Have you ever felt that you should Cut down on your drinking?   Have people Annoyed you by criticizing your drinking?   Have you ever felt bad or Guilty about your drinking?   Have you ever had a drink first thing in the morning to steady your nerves or get rid of a hangover (Eye opener)?  If you answered positively to any of these questions: You may be at risk for alcohol-related problems if alcohol consumption is:   Men: Greater than 14 drinks per week or more than 4 drinks per occasion.   Women: Greater than 7 drinks per week or more than 3 drinks per occasion.  Do you or your family have a medical history of alcohol-related problems, such as:  Blackouts.   Sexual dysfunction.   Depression.   Trauma.   Liver dysfunction.   Sleep disorders.   Hypertension.   Chronic abdominal pain.   Has your drinking ever caused you problems, such as problems with your family, problems with your work (or school) performance, or accidents/injuries?   Do you have a compulsion to drink or a preoccupation  Do you have a compulsion to drink or a preoccupation with drinking?   Do you have poor control or are you unable to stop drinking once you have started?   Do you have to drink to avoid withdrawal symptoms?   Do you have problems with withdrawal such as tremors, nausea, sweats, or mood disturbances?   Does it take more alcohol than in the past to get you high?   Do you feel a strong urge to drink?   Do you change your plans so that you can have a drink?   Do you ever drink in the morning to relieve  the shakes or a hangover?  If you have answered a number of the previous questions positively, it may be time for you to talk to your caregivers, family, and friends and see if they think you have a problem. Alcoholism is a chemical dependency that keeps getting worse and will eventually destroy your health and relationships. Many alcoholics end up dead, impoverished, or in prison. This is often the end result of all chemical dependency.   Do not be discouraged if you are not ready to take action immediately.   Decisions to change behavior often involve up and down desires to change and feeling like you cannot decide.   Try to think more seriously about your drinking behavior.   Think of the reasons to quit.  WHERE TO GO FOR ADDITIONAL INFORMATION    The National Institute on Alcohol Abuse and Alcoholism (NIAAA)www.niaaa.nih.gov   National Council on Alcoholism and Drug Dependence (NCADD)www.ncadd.org   American Society of Addiction Medicine (ASAM)www.asam.org  Document Released: 10/03/2005 Document Revised: 09/22/2011 Document Reviewed: 05/21/2008  Exit[employer] Patient Information 2012 [employer].

## 2012-01-27 NOTE — ED Notes (Signed)
Patient denies any other kind of drug or substance abuse. Denies any SI or HI. States after detox he does not want to go back home because that's is usually what causes him to start drinking again.

## 2012-01-27 NOTE — ED Notes (Signed)
CBG 89 Rn notified ED

## 2012-01-27 NOTE — ED Notes (Signed)
Informed patient will be going to [facility], transportation should arrive to pick pt up around 0900.

## 2012-03-04 ENCOUNTER — Inpatient Hospital Stay (HOSPITAL_COMMUNITY)
Admission: EM | Admit: 2012-03-04 | Discharge: 2012-03-09 | DRG: 897 | Disposition: A | Discharge status: Home / Self Care | Payer: Federal, State, Local not specified - Other | Attending: Psychiatry | Admitting: Psychiatry

## 2012-03-04 ENCOUNTER — Encounter (HOSPITAL_COMMUNITY): Payer: Self-pay | Admitting: *Deleted

## 2012-03-04 ENCOUNTER — Emergency Department (HOSPITAL_COMMUNITY)
Admission: EM | Admit: 2012-03-04 | Discharge: 2012-03-04 | Disposition: A | Discharge status: Other Acute Inpatient Hospital | Payer: Self-pay | Attending: Emergency Medicine | Admitting: Emergency Medicine

## 2012-03-04 DIAGNOSIS — B182 Chronic viral hepatitis C: Secondary | ICD-10-CM | POA: Diagnosis present

## 2012-03-04 DIAGNOSIS — J449 Chronic obstructive pulmonary disease, unspecified: Secondary | ICD-10-CM | POA: Insufficient documentation

## 2012-03-04 DIAGNOSIS — F1020 Alcohol dependence, uncomplicated: Secondary | ICD-10-CM | POA: Diagnosis present

## 2012-03-04 DIAGNOSIS — M4120 Other idiopathic scoliosis, site unspecified: Secondary | ICD-10-CM | POA: Diagnosis present

## 2012-03-04 DIAGNOSIS — F10239 Alcohol dependence with withdrawal, unspecified: Principal | ICD-10-CM | POA: Diagnosis present

## 2012-03-04 DIAGNOSIS — F410 Panic disorder [episodic paroxysmal anxiety] without agoraphobia: Secondary | ICD-10-CM | POA: Diagnosis present

## 2012-03-04 DIAGNOSIS — F3289 Other specified depressive episodes: Secondary | ICD-10-CM | POA: Insufficient documentation

## 2012-03-04 DIAGNOSIS — F1920 Other psychoactive substance dependence, uncomplicated: Secondary | ICD-10-CM | POA: Diagnosis present

## 2012-03-04 DIAGNOSIS — F10988 Alcohol use, unspecified with other alcohol-induced disorder: Secondary | ICD-10-CM | POA: Diagnosis present

## 2012-03-04 DIAGNOSIS — F10939 Alcohol use, unspecified with withdrawal, unspecified: Principal | ICD-10-CM | POA: Diagnosis present

## 2012-03-04 DIAGNOSIS — Z9119 Patient's noncompliance with other medical treatment and regimen: Secondary | ICD-10-CM

## 2012-03-04 DIAGNOSIS — F17200 Nicotine dependence, unspecified, uncomplicated: Secondary | ICD-10-CM | POA: Insufficient documentation

## 2012-03-04 DIAGNOSIS — J4489 Other specified chronic obstructive pulmonary disease: Secondary | ICD-10-CM | POA: Insufficient documentation

## 2012-03-04 DIAGNOSIS — F329 Major depressive disorder, single episode, unspecified: Secondary | ICD-10-CM | POA: Insufficient documentation

## 2012-03-04 DIAGNOSIS — Z91199 Patient's noncompliance with other medical treatment and regimen due to unspecified reason: Secondary | ICD-10-CM

## 2012-03-04 DIAGNOSIS — F1910 Other psychoactive substance abuse, uncomplicated: Secondary | ICD-10-CM | POA: Insufficient documentation

## 2012-03-04 DIAGNOSIS — Z8619 Personal history of other infectious and parasitic diseases: Secondary | ICD-10-CM | POA: Insufficient documentation

## 2012-03-04 HISTORY — DX: Scoliosis, unspecified: M41.9

## 2012-03-04 LAB — RAPID URINE DRUG SCREEN, HOSP PERFORMED
Barbiturates: NOT DETECTED
Cocaine: POSITIVE — AB
Tetrahydrocannabinol: POSITIVE — AB

## 2012-03-04 LAB — COMPREHENSIVE METABOLIC PANEL
ALT: 19 U/L (ref 0–53)
AST: 26 U/L (ref 0–37)
Alkaline Phosphatase: 82 U/L (ref 39–117)
CO2: 24 mEq/L (ref 19–32)
Chloride: 105 mEq/L (ref 96–112)
GFR calc Af Amer: 83 mL/min — ABNORMAL LOW (ref >90)
GFR calc non Af Amer: 71 mL/min — ABNORMAL LOW (ref >90)
Glucose, Bld: 84 mg/dL (ref 70–99)
Potassium: 3.6 mEq/L (ref 3.5–5.1)
Sodium: 139 mEq/L (ref 135–145)
Total Bilirubin: 0.2 mg/dL — ABNORMAL LOW (ref 0.3–1.2)

## 2012-03-04 LAB — CBC
Hemoglobin: 14.9 g/dL (ref 13.0–17.0)
Platelets: 231 10*3/uL (ref 150–400)
RBC: 4.75 MIL/uL (ref 4.22–5.81)
WBC: 7 10*3/uL (ref 4.0–10.5)

## 2012-03-04 MED ORDER — CHLORDIAZEPOXIDE HCL 25 MG PO CAPS
25.0000 mg | ORAL_CAPSULE | ORAL | Status: AC
  Administered 2012-03-07 (×2): 25 mg via ORAL
  Filled 2012-03-04 (×2): qty 1

## 2012-03-04 MED ORDER — ADULT MULTIVITAMIN W/MINERALS CH
1.0000 | ORAL_TABLET | Freq: Every day | ORAL | Status: DC
  Administered 2012-03-05 – 2012-03-09 (×5): 1 via ORAL
  Filled 2012-03-04 (×4): qty 1
  Filled 2012-03-04: qty 14
  Filled 2012-03-04 (×2): qty 1

## 2012-03-04 MED ORDER — LOPERAMIDE HCL 2 MG PO CAPS: 2.0000 mg | ORAL_CAPSULE | ORAL | Status: AC | PRN

## 2012-03-04 MED ORDER — VITAMIN B-1 100 MG PO TABS
100.0000 mg | ORAL_TABLET | Freq: Every day | ORAL | Status: DC
  Administered 2012-03-05 – 2012-03-09 (×5): 100 mg via ORAL
  Filled 2012-03-04 (×7): qty 1

## 2012-03-04 MED ORDER — FLUOXETINE HCL 20 MG PO CAPS
40.0000 mg | ORAL_CAPSULE | Freq: Every day | ORAL | Status: DC
  Administered 2012-03-05 – 2012-03-09 (×5): 40 mg via ORAL
  Filled 2012-03-04 (×4): qty 2
  Filled 2012-03-04: qty 28
  Filled 2012-03-04 (×2): qty 2

## 2012-03-04 MED ORDER — FLUOXETINE HCL 20 MG PO CAPS
40.0000 mg | ORAL_CAPSULE | Freq: Every day | ORAL | Status: DC
  Administered 2012-03-04: 40 mg via ORAL
  Filled 2012-03-04 (×2): qty 2

## 2012-03-04 MED ORDER — ONDANSETRON 4 MG PO TBDP: 4.0000 mg | ORAL_TABLET | Freq: Four times a day (QID) | ORAL | Status: AC | PRN

## 2012-03-04 MED ORDER — ACETAMINOPHEN 325 MG PO TABS
650.0000 mg | ORAL_TABLET | Freq: Four times a day (QID) | ORAL | Status: DC | PRN
  Administered 2012-03-04: 650 mg via ORAL

## 2012-03-04 MED ORDER — ONDANSETRON HCL 4 MG PO TABS: 4.0000 mg | ORAL_TABLET | Freq: Three times a day (TID) | ORAL | Status: DC | PRN

## 2012-03-04 MED ORDER — THIAMINE HCL 100 MG/ML IJ SOLN
100.0000 mg | Freq: Once | INTRAMUSCULAR | Status: AC
  Administered 2012-03-04: 100 mg via INTRAMUSCULAR

## 2012-03-04 MED ORDER — MAGNESIUM HYDROXIDE 400 MG/5ML PO SUSP: 30.0000 mL | Freq: Every day | ORAL | Status: DC | PRN

## 2012-03-04 MED ORDER — NICOTINE 21 MG/24HR TD PT24: 21.0000 mg | MEDICATED_PATCH | Freq: Every day | TRANSDERMAL | Status: DC | PRN

## 2012-03-04 MED ORDER — ZOLPIDEM TARTRATE 5 MG PO TABS: 5.0000 mg | ORAL_TABLET | Freq: Every evening | ORAL | Status: DC | PRN

## 2012-03-04 MED ORDER — CHLORDIAZEPOXIDE HCL 25 MG PO CAPS
25.0000 mg | ORAL_CAPSULE | Freq: Three times a day (TID) | ORAL | Status: AC
  Administered 2012-03-06 (×3): 25 mg via ORAL
  Filled 2012-03-04 (×3): qty 1

## 2012-03-04 MED ORDER — CHLORDIAZEPOXIDE HCL 25 MG PO CAPS
25.0000 mg | ORAL_CAPSULE | Freq: Four times a day (QID) | ORAL | Status: AC
  Administered 2012-03-04 – 2012-03-05 (×5): 25 mg via ORAL
  Filled 2012-03-04 (×6): qty 1

## 2012-03-04 MED ORDER — ALUM & MAG HYDROXIDE-SIMETH 200-200-20 MG/5ML PO SUSP: 30.0000 mL | ORAL | Status: DC | PRN

## 2012-03-04 MED ORDER — HYDROXYZINE HCL 25 MG PO TABS
25.0000 mg | ORAL_TABLET | Freq: Four times a day (QID) | ORAL | Status: AC | PRN
  Administered 2012-03-05 – 2012-03-06 (×2): 25 mg via ORAL
  Filled 2012-03-04: qty 1

## 2012-03-04 MED ORDER — IBUPROFEN 600 MG PO TABS: 600.0000 mg | ORAL_TABLET | Freq: Three times a day (TID) | ORAL | Status: DC | PRN

## 2012-03-04 MED ORDER — ACETAMINOPHEN 325 MG PO TABS: 650.0000 mg | ORAL_TABLET | ORAL | Status: DC | PRN

## 2012-03-04 MED ORDER — CHLORDIAZEPOXIDE HCL 25 MG PO CAPS
25.0000 mg | ORAL_CAPSULE | Freq: Four times a day (QID) | ORAL | Status: AC | PRN
  Administered 2012-03-06: 25 mg via ORAL
  Filled 2012-03-04: qty 1

## 2012-03-04 MED ORDER — CHLORDIAZEPOXIDE HCL 25 MG PO CAPS: 25.0000 mg | ORAL_CAPSULE | Freq: Once | ORAL | Status: DC

## 2012-03-04 MED ORDER — CHLORDIAZEPOXIDE HCL 25 MG PO CAPS
25.0000 mg | ORAL_CAPSULE | Freq: Every day | ORAL | Status: AC
  Administered 2012-03-08: 25 mg via ORAL
  Filled 2012-03-04: qty 1

## 2012-03-04 NOTE — ED Notes (Signed)
Pt accepted to [facility] 306-2 by Dr. Catha Brow. Report called to [place], Charity fundraiser. Pt's VSS. Denies pain. NAD noted. Pt will be transported to [facility] by [employer].

## 2012-03-04 NOTE — ED Provider Notes (Signed)
History     CSN: [id number]  Arrival date & time 03/04/12  0144   First MD Initiated Contact with Patient 03/04/12 0330      Chief Complaint  Patient presents with  . Medical Clearance    (Consider location/radiation/quality/duration/timing/severity/associated sxs/prior treatment) HPI Comments: Patient presents with chronic problems with depression.  He presents today complaining of having difficulty coping with his life is he states he spent significant amount of time in prison and feels institutionalized.  He denies any suicidal or homicidal thoughts.  Patient does endorse that he drinks alcohol and uses other substances such as cocaine.  He had previously been seeking [facility] a number of months ago and wishes to return to that program.  He currently lives with his mother but is finding that living situation difficult as well the  Patient is a 56 y.o. male presenting with mental health disorder. The history is provided by the patient. No language interpreter was used.  Mental Health Problem  Additional symptoms of the illness do not include no headaches or no abdominal pain.    Past Medical History  Diagnosis Date  . COPD (chronic obstructive pulmonary disease)   . Hep C w/o coma, chronic   . Mental health disorder   . Scoliosis     Past Surgical History  Procedure Date  . Thyroid surgery   . Skin cancer excision     History reviewed. No pertinent family history.  History  Substance Use Topics  . Smoking status: Current Everyday Smoker -- 1.0 packs/day for 40 years    Types: Cigarettes  . Smokeless tobacco: Not on file  . Alcohol Use: Yes     12/daily      Review of Systems  Constitutional: Negative.  Negative for fever and chills.  HENT: Negative.   Eyes: Negative.  Negative for discharge and redness.  Respiratory: Negative.  Negative for cough and shortness of breath.   Cardiovascular: Negative.  Negative for chest pain.  Gastrointestinal:  Negative.  Negative for nausea, vomiting and abdominal pain.  Genitourinary: Negative.  Negative for hematuria.  Musculoskeletal: Negative.  Negative for back pain.  Skin: Negative.  Negative for color change and rash.  Neurological: Negative for syncope and headaches.  Hematological: Negative.  Negative for adenopathy.  Psychiatric/Behavioral: Negative for confusion.  All other systems reviewed and are negative.    Allergies  Review of patient's allergies indicates no known allergies.  Home Medications   Current Outpatient Rx  Name Route Sig Dispense Refill  . FLUOXETINE HCL 40 MG PO CAPS Oral Take 40 mg by mouth daily.      Marland Kitchen HYDROXYZINE PAMOATE 25 MG PO CAPS Oral Take 25 mg by mouth 3 (three) times daily as needed. For anxiety      BP 107/69  Pulse 75  Temp(Src) 98.2 F (36.8 C) (Oral)  Resp 16  SpO2 93%  Physical Exam  Nursing note and vitals reviewed. Constitutional: He is oriented to person, place, and time. He appears well-developed and well-nourished.  Non-toxic appearance. He does not have a sickly appearance.  HENT:  Head: Normocephalic and atraumatic.  Eyes: Conjunctivae, EOM and lids are normal. Pupils are equal, round, and reactive to light.  Neck: Trachea normal, normal range of motion and full passive range of motion without pain. Neck supple.  Cardiovascular: Normal rate, regular rhythm and normal heart sounds.   Pulmonary/Chest: Effort normal and breath sounds normal. No respiratory distress.  Abdominal: Soft. Normal appearance. He exhibits  no distension. There is no tenderness. There is no rebound and no CVA tenderness.  Musculoskeletal: Normal range of motion.  Neurological: He is alert and oriented to person, place, and time. He has normal strength.  Skin: Skin is warm, dry and intact. No rash noted.  Psychiatric: His behavior is normal. Judgment and thought content normal.       Depressed mood and affect    ED Course  Procedures (including critical  care time)  Results for orders placed during the hospital encounter of 03/04/12  CBC      Component Value Range   WBC 7.0  4.0 - 10.5 (K/uL)   RBC 4.75  4.22 - 5.81 (MIL/uL)   Hemoglobin 14.9  13.0 - 17.0 (g/dL)   HCT 40.9  81.1 - 91.4 (%)   MCV 89.1  78.0 - 100.0 (fL)   MCH 31.4  26.0 - 34.0 (pg)   MCHC 35.2  30.0 - 36.0 (g/dL)   RDW 78.2  95.6 - 21.3 (%)   Platelets 231  150 - 400 (K/uL)  COMPREHENSIVE METABOLIC PANEL      Component Value Range   Sodium 139  135 - 145 (mEq/L)   Potassium 3.6  3.5 - 5.1 (mEq/L)   Chloride 105  96 - 112 (mEq/L)   CO2 24  19 - 32 (mEq/L)   Glucose, Bld 84  70 - 99 (mg/dL)   BUN 12  6 - 23 (mg/dL)   Creatinine, Ser 0.86  0.50 - 1.35 (mg/dL)   Calcium 9.5  8.4 - 57.8 (mg/dL)   Total Protein 7.3  6.0 - 8.3 (g/dL)   Albumin 4.1  3.5 - 5.2 (g/dL)   AST 26  0 - 37 (U/L)   ALT 19  0 - 53 (U/L)   Alkaline Phosphatase 82  39 - 117 (U/L)   Total Bilirubin 0.2 (*) 0.3 - 1.2 (mg/dL)   GFR calc non Af Amer 71 (*) >90 (mL/min)   GFR calc Af Amer 83 (*) >90 (mL/min)  ETHANOL      Component Value Range   Alcohol, Ethyl (B) 145 (*) 0 - 11 (mg/dL)  URINE RAPID DRUG SCREEN (HOSP PERFORMED)      Component Value Range   Opiates NONE DETECTED  NONE DETECTED    Cocaine POSITIVE (*) NONE DETECTED    Benzodiazepines NONE DETECTED  NONE DETECTED    Amphetamines NONE DETECTED  NONE DETECTED    Tetrahydrocannabinol POSITIVE (*) NONE DETECTED    Barbiturates NONE DETECTED  NONE DETECTED        MDM  Patient presents requesting mental health resources.  He actually wants to be set up with [facility] again as he felt that that significantly helped him in the past.  He is not suicidal or homicidal at this time.  I will discuss the ability to set this patient up with [facility] with the act team.        Tim Christen, MD 03/04/12 [phone]

## 2012-03-04 NOTE — Progress Notes (Signed)
Patient ID: Tim [name], male   DOB: 1956-03-05, 56 y.o.   MRN: [id number] Patient admitted voluntarily requesting detox from ETOH, cocaine, and THC. He reports being at [facility] last year for help with similar problems. 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 He received treatment at [facility] last year. Denies any SI/HI or AVH. Patient talked about "feeling myself slipping and decided to get help early." Reports that he lives with his 47 year old mother. Drinks 2-3 40 ounce beers daily and uses cocaine on occasion. Medical history of Hep C, COPD, and Scoliosis. Oriented to the 300 hall unit and routine. Every fifteen safety checks initiated.

## 2012-03-04 NOTE — ED Notes (Signed)
[facility] Tory called to ask about pt's respiratory status r/t h/o COPD. Pt reported that he gets SOB with exercise but otherwise requires no special treatment for this. Lungs CTA. Tory notified.
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
## 2012-03-04 NOTE — Discharge Instructions (Signed)
Take pt to [facility], bed ready.

## 2012-03-04 NOTE — ED Notes (Signed)
Pt accepted to [facility] 306-2 by Dr. Catha Brow. Report called to [place], Charity fundraiser. Pt aware and cooperative. VSS. Denied pain or distress. NAD noted. Security transporting with Vernetta Honey.

## 2012-03-04 NOTE — Tx Team (Signed)
Initial Interdisciplinary Treatment Plan  PATIENT STRENGTHS: (choose at least two) Ability for insight Active sense of humor Average or above average intelligence Capable of independent living Communication skills Supportive family/friends  PATIENT STRESSORS: Financial difficulties Substance abuse   PROBLEM LIST: Problem List/Patient Goals Date to be addressed Date deferred Reason deferred Estimated date of resolution         Substance Abuse      03/04/12              Depression      03/04/12                                                DISCHARGE CRITERIA:  Ability to meet basic life and health needs Adequate post-discharge living arrangements Improved stabilization in mood, thinking, and/or behavior Medical problems require only outpatient monitoring Motivation to continue treatment in a less acute level of care Need for constant or close observation no longer present Reduction of life-threatening or endangering symptoms to within safe limits Safe-care adequate arrangements made Verbal commitment to aftercare and medication compliance Withdrawal symptoms are absent or subacute and managed without 24-hour nursing intervention  PRELIMINARY DISCHARGE PLAN: Return to previous living arrangement  PATIENT/FAMIILY INVOLVEMENT: This treatment plan has been presented to and reviewed with the patient, Tim [name], and/or family member.  The patient and family have been given the opportunity to ask questions and make suggestions.  Fransisca Kaufmann ANN 03/04/2012, 9:39 PM

## 2012-03-04 NOTE — BH Assessment (Signed)
Pt accepted [facility]. Accepted by [facility] to [facility]. RN and EDP notified. Support paperwork faxed to [facility] and originals put in pt's chart.

## 2012-03-04 NOTE — ED Notes (Signed)
Pt Belongings moved to locker  38.  3 bags

## 2012-03-04 NOTE — ED Notes (Signed)
Pt presents for ETOH detox. Denies SI and HI, denies hallucinations

## 2012-03-04 NOTE — ED Provider Notes (Signed)
Act team indicates pt accepted to [facility], bed ready.   Suzi Roots, MD 03/04/12 2021

## 2012-03-04 NOTE — ED Notes (Signed)
Pt denies SI/HI, AVH. States wants detox. Pt stated that he left [facility] program over a year ago without completing it because he thought he could handle things on his own but now he says he sees that he can't. He would like to get "hooked up with Viviann Spare and the boys from [facility] again and try to come up with a life plan".

## 2012-03-05 DIAGNOSIS — F1020 Alcohol dependence, uncomplicated: Secondary | ICD-10-CM | POA: Diagnosis present

## 2012-03-05 DIAGNOSIS — F1920 Other psychoactive substance dependence, uncomplicated: Secondary | ICD-10-CM

## 2012-03-05 DIAGNOSIS — F10939 Alcohol use, unspecified with withdrawal, unspecified: Principal | ICD-10-CM

## 2012-03-05 DIAGNOSIS — F10239 Alcohol dependence with withdrawal, unspecified: Principal | ICD-10-CM

## 2012-03-05 DIAGNOSIS — F1994 Other psychoactive substance use, unspecified with psychoactive substance-induced mood disorder: Secondary | ICD-10-CM

## 2012-03-05 NOTE — Progress Notes (Signed)
Pt attended discharge planning group and actively participated.  Pt presents with calm mood and affect.  Pt was open with sharing reason for entering the hospital.  Pt states that he relapsed on alcohol and came here to detox.  Pt states that he has been to [facility] a year ago and reports this being helpful.  Pt states that he is planning on starting a new job soon.  Pt states that he lives in [place] with his mother and can return back there.  Pt is considering going to further treatment but is unsure at this time.  SW will assess for appropriate referrals.  Pt denies having depression and SI and ranks anxiety at a 7.  No further needs voiced by pt at this time.  Safety planning and suicide prevention discussed.     Reyes Ivan, LCSWA 03/05/2012  10:04 AM

## 2012-03-05 NOTE — Progress Notes (Signed)
[facility] Group Notes:  (Counselor/Nursing/MHT/Case Management/Adjunct)  03/05/2012 9:09 PM  Type of Therapy:  Group Therapy at 11AM  Participation Level:  Active  Participation Quality:  Appropriate, Attentive and Sharing  Affect:  Appropriate  Cognitive:  Alert and Oriented  Insight:  Good  Engagement in Group:  Good  Engagement in Therapy:  Good  Modes of Intervention:  Clarification, Orientation and Support  Summary of Progress/Problems:  Tim [name] was attentive to discussion on overcoming obstacles and shared at several points during group about his own obstacles and offered input to other group members.  "One of the hardest things for me to change is who I spend my time with outside of my family. The few friends I have both use and that is how we spend our time, so at my age changing all that is hard."  Tim [name] willingly shared repercussions of continuing to use alcohol and drugs with a younger male patient, "you have the opportunity to change your life right now, look around, hell look at me is this what you want in 40 years?"   Clide Dales 03/05/2012, 9:09 PM

## 2012-03-05 NOTE — Progress Notes (Signed)
Patient ID: Tim [name], male   DOB: Jun 01, 1956, 56 y.o.   MRN: [id number] Sleeping at this time without c/o's. Resp reg, unlabored.  Will continue to monitor.

## 2012-03-05 NOTE — Progress Notes (Signed)
[facility] Adult Inpatient Family Collateral Contact    Patient's mother, Ms Deanna Boehlke, at [phone] has been identified by the patient as the family member with whom the patient will be residing. With written consent from the patient, the family member has been contacted.    Mother states patient has been off medications for at least one month and has appointment at [facility] this week, 03/07/12.  She also confirmed patient's statements about substance abuse, panic attacks and potential job opportunitys as reasons for seeking detox. Writer explained mobile crisis services and provided contact number.    Clide Dales 03/05/2012, 9:32 AM

## 2012-03-05 NOTE — Progress Notes (Signed)
Adult Psychosocial Assessment Update Interdisciplinary Team  Previous [facility] admissions/discharges:  Admissions Discharges  Date: 05/28/11 Date:  Date: Date:  Date: Date:  Date: Date:  Date: Date:   Changes since the last Psychosocial Assessment (including adherence to outpatient mental health and/or substance abuse treatment, situational issues contributing to decompensation and/or relapse).  Patient continues to live with Mother.  Reports recent onset of panic attacks which he   believes may have something to do with use of alcohol (80-120 oz daily), THC (daily if)    available and cocaine (twice weekly).Also "white liquor" (several shots a week)    Panic attacks and opportunities for employment led patient to seek detox.b Patient also   confirms mother's report that he has been off meds for one month minimum as did not    Have transportation to get to [facility]   Discharge Plan 1. Will you be returning to the same living situation after discharge?   Yes: With Mother No:      If no, what is your plan?           2. Would you like a referral for services when you are discharged? Yes:     If yes, for what services?  No:         Currently seen at [facility]       Summary and Recommendations (to be completed by the evaluator)   Patient is 56 YO single unemplyed caucasian male admitted with diagnosis of Poly-    substance Abuse and Major Depressive Disorder, Recurrent and Moderate. Patient     will benefit from crisis stabilization, medication evaluation, group therapy and psycho-    education in addition to case management for discharge planning.                  Signature:  Clide Dales, 03/05/2012 8:52 AM

## 2012-03-05 NOTE — H&P (Signed)
Pt seen and evaluated upon admission.  Completed Admission Suicide Risk Assessment.  See orders.  Pt agreeable with plan.  Discussed with team.   

## 2012-03-05 NOTE — H&P (Signed)
Psychiatric Admission Assessment Adult  Patient Identification:  Tim [name] Date of Evaluation:  03/05/2012 Chief Complaint: Requests alcohol detox. History of Present Illness:: This is one of several admissions for [facility] who presents requesting detox from alcohol. He's been drinking significant amounts her most every day of the week, and had begun drinking beer and years. He has also used marijuana since age 56 he says he uses a regular basis is he is using cocaine less than 1 g about 3 times a month since he was age 49. He also endorses depression today and and says that his Prozac medication had helped him, but he has no transportation for followup and has been unable to get the medication.  Mental status exam: Presents today fully alert, slightly disheveled, asking for help being detoxed. Denying any suicidal thoughts. Mood depressed and affect congruent. Thoughts and speech are normally performed with no evidence of psychosis. Insight fair, cognition intact. Denies suicidal intent and plan.   Past psychiatric history: Previous admissions at [facility] residential treatment. Pulse previous admission at [facility]. Previous medication trials include Prozac for depression, Lexapro 10 mg daily and Seroquel 300 mg at bedtime for agitation. He has a history of hepatitis C. Previous history of breaking and entering and selling drugs with present time in 1994. No current outpatient treatment  Past Psychiatric History: see above Diagnosis:  Hospitalizations:  Outpatient Care:  Substance Abuse Care:  Self-Mutilation:  Suicidal Attempts:  Violent Behaviors:   Past Medical History:   Past Medical History  Diagnosis Date  . COPD (chronic obstructive pulmonary disease)   . Hep C w/o coma, chronic   . Mental health disorder   . Scoliosis     Allergies:  No Known Allergies PTA Medications: Prescriptions prior to admission  Medication Sig Dispense Refill  . FLUoxetine (PROZAC) 40  MG capsule Take 40 mg by mouth daily.        . hydrOXYzine (VISTARIL) 25 MG capsule Take 25 mg by mouth 3 (three) times daily as needed. For anxiety        Previous Psychotropic Medications:  Medication/Dose                 Substance Abuse History in the last 12 months: Substance Age of 1st Use Last Use Amount Specific Type  Nicotine      Alcohol      Cannabis      Opiates      Cocaine      Methamphetamines      LSD      Ecstasy      Benzodiazepines      Caffeine      Inhalants      Others:                         Consequences of Substance Abuse: Social History: Current Place of Residence:  Single male, unemployed, living with mother. No current legal problems Place of Birth:   Family Members: Marital Status:   Children:  Sons:  Daughters: Relationships: Education:   Educational Problems/Performance: Religious Beliefs/Practices: History of Abuse (Emotional/Phsycial/Sexual) Occupational Experiences; Military History:   Legal History: Hobbies/Interests:  Family History:  Father with history of alcoholism.   Mental Status Examination/Evaluation: see above  Medical Evaluation:  I have medically and physically evaluated this patient and my findings are consistent with those of the emergency room.   Alcohol screen 145 mg/dL. Urine drug screen positive for cocaine and cannabis. Liver enzymes are normal. Electrolytes are normal. Laboratory/X-Ray Psychological Evaluation(s)      Assessment:    AXIS I:  Polysubstance Dependence; Alcohol W/D; SIMD AXIS II:  Borderline Intellectual Functioning AXIS III:  Hx of Hep C; and COPD Past Medical History  Diagnosis Date  . COPD (chronic obstructive pulmonary disease)   . Hep C w/o coma, chronic   . Mental health disorder   . Scoliosis    AXIS IV:  deferred AXIS V:  40.  Treatment Plan Summary: Daily contact with patient to assess and evaluate  symptoms and progress in treatment Medication management  Librium Detox protocol and restart Prozac 20mg  daily.  Patient agrees with plan.   Current Medications:  Current Facility-Administered Medications  Medication Dose Route Frequency Provider Last Rate Last Dose  . acetaminophen (TYLENOL) tablet 650 mg  650 mg Oral Q6H PRN Wonda Cerise, MD   650 mg at 03/04/12 2237  . alum & mag hydroxide-simeth (MAALOX/MYLANTA) 200-200-20 MG/5ML suspension 30 mL  30 mL Oral Q4H PRN Wonda Cerise, MD      . chlordiazePOXIDE (LIBRIUM) capsule 25 mg  25 mg Oral Q6H PRN Wonda Cerise, MD      . chlordiazePOXIDE (LIBRIUM) capsule 25 mg  25 mg Oral QID Wonda Cerise, MD   25 mg at 03/05/12 1214   Followed by  . chlordiazePOXIDE (LIBRIUM) capsule 25 mg  25 mg Oral TID Wonda Cerise, MD       Followed by  . chlordiazePOXIDE (LIBRIUM) capsule 25 mg  25 mg Oral BH-qamhs Wonda Cerise, MD       Followed by  . chlordiazePOXIDE (LIBRIUM) capsule 25 mg  25 mg Oral Daily Wonda Cerise, MD      . FLUoxetine (PROZAC) capsule 40 mg  40 mg Oral Daily Wonda Cerise, MD   40 mg at 03/05/12 4696  . hydrOXYzine (ATARAX/VISTARIL) tablet 25 mg  25 mg Oral Q6H PRN Wonda Cerise, MD      . loperamide (IMODIUM) capsule 2-4 mg  2-4 mg Oral PRN Wonda Cerise, MD      . magnesium hydroxide (MILK OF MAGNESIA) suspension 30 mL  30 mL Oral Daily PRN Wonda Cerise, MD      . mulitivitamin with minerals tablet 1 tablet  1 tablet Oral Daily Wonda Cerise, MD   1 tablet at 03/05/12 0834  . ondansetron (ZOFRAN-ODT) disintegrating tablet 4 mg  4 mg Oral Q6H PRN Wonda Cerise, MD      . thiamine (B-1) injection 100 mg  100 mg Intramuscular Once Wonda Cerise, MD   100 mg at 03/04/12 2235  . thiamine (VITAMIN B-1) tablet 100 mg  100 mg Oral Daily Wonda Cerise, MD   100 mg at 03/05/12 0834  . DISCONTD: chlordiazePOXIDE (LIBRIUM) capsule 25 mg  25 mg Oral Once Wonda Cerise, MD       Facility-Administered Medications Ordered in Other Encounters  Medication Dose Route Frequency Provider  Last Rate Last Dose  . DISCONTD: acetaminophen (TYLENOL) tablet 650 mg  650 mg Oral Q4H PRN Nat Christen, MD      . DISCONTD: alum & mag hydroxide-simeth (MAALOX/MYLANTA) 200-200-20 MG/5ML suspension 30 mL  30 mL Oral PRN Nat Christen, MD      . DISCONTD: FLUoxetine (PROZAC) capsule 40 mg  40 mg Oral Daily Nat Christen, MD   40 mg at 03/04/12 1138  . DISCONTD: ibuprofen (ADVIL,MOTRIN) tablet 600 mg  600 mg Oral Q8H PRN Nat Christen, MD      . DISCONTD: nicotine (NICODERM CQ - dosed in mg/24 hours) patch 21 mg  21 mg Transdermal Daily PRN Nat Christen, MD      . DISCONTD: ondansetron (ZOFRAN) tablet 4 mg  4 mg Oral Q8H PRN Nat Christen, MD      . DISCONTD: zolpidem (AMBIEN) tablet 5 mg  5 mg Oral QHS PRN Nat Christen, MD        Observation Level/Precautions:    Laboratory:    Psychotherapy:    Medications:    Routine PRN Medications:    Consultations:    Discharge Concerns:    Other:     Phylicia Mcgaugh A 5/20/20133:43 PM

## 2012-03-05 NOTE — [facility] Suicide Risk Assessment (Signed)
Suicide Risk Assessment  Admission Assessment     Demographic factors:  See chart.  Current Mental Status: Patient seen and evaluated. Chart reviewed. Patient stated that his mood was "ok". His affect was mood congruent and constricted. He denied any current thoughts of self injurious behavior, suicidal ideation or homicidal ideation. There were no auditory or visual hallucinations, paranoia, delusional thought processes, or mania noted.  Thought process was linear and goal directed.  No psychomotor agitation or retardation was noted. His speech was normal rate, tone and volume. Eye contact was good. Judgment and insight are fair.  Patient has been up and engaged on the unit.  No acute safety concerns reported from team.  Loss Factors:  Decrease in vocational status;Financial problems / change in socioeconomic status  Historical Factors: poor access to care; unemployed; medication noncompliance; Hx SI; no reported SIB/attempts  Risk Reduction Factors: Positive social support; potential job pending  CLINICAL FACTORS: Polysubstance Dependence; Alcohol W/D; SIMD   COGNITIVE FEATURES THAT CONTRIBUTE TO RISK: BIF, per past Hx.  SUICIDE RISK: Pt viewed as a chronic increased risk of harm to self in light of his past hx and risk factors.  No acute safety concerns on the unit.  Pt contracting for safety and in need of crisis stabilization, detox & Tx.  PLAN OF CARE: Pt admitted for crisis stabilization, detox and treatment. Restarted on SSRI.  Librium taper for alcohol w/d.  Pt not open to rehab.  Please see orders.  Medications reviewed with pt and medication education provided.  Will continue q15 minute checks per unit protocol.  No clinical indication for one on one level of observation at this time.  Pt contracting for safety.  Mental health treatment, medication management and continued sobriety will mitigate against the increased risk of harm to self and/or others.  Discussed the importance of  recovery with pt, as well as, tools to move forward in a healthy & safe manner.  Pt agreeable with the plan.  Discussed with the team.   [name] 03/05/2012, 3:15 PM

## 2012-03-06 MED ORDER — IBUPROFEN 800 MG PO TABS
800.0000 mg | ORAL_TABLET | Freq: Three times a day (TID) | ORAL | Status: DC
  Administered 2012-03-06 – 2012-03-09 (×10): 800 mg via ORAL
  Filled 2012-03-06 (×18): qty 1

## 2012-03-06 NOTE — Tx Team (Signed)
Interdisciplinary Treatment Plan Update (Adult)  Date:  03/06/2012  Time Reviewed:  9:57 AM   Progress in Treatment: Attending groups: Yes Participating in groups:  Yes Taking medication as prescribed: Yes Tolerating medication:  Yes Family/Significant othe contact made:  Yes Patient understands diagnosis:  Yes Discussing patient identified problems/goals with staff:  Yes Medical problems stabilized or resolved:  Yes Denies suicidal/homicidal ideation: Yes Issues/concerns per patient self-inventory:  None identified Other: N/A  New problem(s) identified: None Identified  Reason for Continuation of Hospitalization: Anxiety Depression Medication stabilization Withdrawal symptoms  Interventions implemented related to continuation of hospitalization: mood stabilization, medication monitoring and adjustment, group therapy and psycho education, safety checks q 15 mins  Additional comments: N/A  Estimated length of stay: 3-5 days  Discharge Plan: Pt will follow up with [facility] for medication management and therapy and AA meetings.    New goal(s): N/A  Review of initial/current patient goals per problem list:    1.  Goal(s): Address substance use  Met:  No  Target date: by discharge  As evidenced by: completing detox protocol and refer to appropriate treatment  2.  Goal (s): Reduce depressive and anxiety symptoms  Met:  No  Target date: by discharge  As evidenced by: Reducing depression from a 10 to a 3 as reported by pt.    3.  Goal(s): Eliminate SI  Met:  No  Target date: by discharge  As evidenced by: pt denying SI.    Attendees: Patient:  Tim [name] 03/06/2012 9:59 AM   Family:     Physician:  [name], DO 03/06/2012 9:57 AM   Nursing: Izola Price, RN 03/06/2012 9:57 AM   Case Manager:  Reyes Ivan, LCSWA 03/06/2012  9:57 AM   Counselor:  Ronda Fairly, LCSWA 03/06/2012  9:57 AM   Other:  Richelle Ito, LCSW 03/06/2012 9:57 AM   Other:   Robbie Louis, RN 03/06/2012 9:59 AM   Other:  Roswell Miners, RN 03/06/2012 9:59 AM   Other:      Scribe for Treatment Team:   Reyes Ivan 03/06/2012 9:57 AM
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
## 2012-03-06 NOTE — Progress Notes (Signed)
[facility] Group Notes:  (Counselor/Nursing/MHT/Case Management/Adjunct)  03/06/2012 10:45 PM  Type of Therapy:  Group Therapy at 1:15  Participation Level:  Active  Participation Quality:  Drowsy  Affect:  Flat  Cognitive:  Oriented  Insight:  Good  Engagement in Group:  Limited  Engagement in Therapy:  Good  Modes of Intervention:  Limit-setting and Support  Summary of Progress/Problems:  Tim [name] was extremely drowsy and fell asleep at one point yet was responsive when awoken by facilitator.   Tim [name] shared that "I have an opportunity for a job I've been wanting for some time and know I can't get it or keep it if I continue to drink.  Others I know can do both and I guess sometimes I feel a little less than because I can't."  Tim [name] was open to concept of disease and idea that this is not something he can necessarily control.    Clide Dales 03/06/2012, 10:51 PM
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
## 2012-03-06 NOTE — Progress Notes (Signed)
[facility] MD Progress Note  03/06/2012 2:44 PM  S/O: Patient seen and evaluated. Chart reviewed. Patient stated that his mood was "ok". His affect was mood congruent and constricted. He denied any current thoughts of self injurious behavior, suicidal ideation or homicidal ideation. There were no auditory or visual hallucinations, paranoia, delusional thought processes, or mania noted. Thought process was linear and goal directed. No psychomotor agitation or retardation was noted. His speech was normal rate, tone and volume. Eye contact was good. Judgment and insight are fair. Patient has been up and engaged on the unit. No acute safety concerns reported from team. W/d off alcohol noted, pt not interested in rehab.  No SEs reported from restart of meds.  Sleep:  Number of Hours: 5.25    Vital Signs:Blood pressure 107/68, pulse 73, temperature 97.6 F (36.4 C), temperature source Oral, resp. rate 16, height 6' 1.5" (1.867 m), weight 78.019 kg (172 lb).  Lab Results: No results found for this or any previous visit (from the past 48 hour(s)).  Physical Findings: CIWA:  CIWA-Ar Total: 5   A/P: Polysubstance Dependence; Alcohol W/D; SIMD  Pt seen and evaluated in treatment team.  Reviewed short term and long term goals, medications, current treatment in the hospital and acute/chronic safety.  Pt denied any current thoughts of self harm, suicidal ideation or homicidal ideation.  Contracted for safety on the unit.  No acute issues noted.  VS reviewed with team.  Pt agreeable with treatment plan, see orders.  Will continue taper and outpt meds with discharge home to mother on Thursday.  Ibuprofen written for pain.  Transportation improved, so pt will be able to access care better upon discharge.  Tim [name] 03/06/2012, 2:44 PM

## 2012-03-06 NOTE — Progress Notes (Signed)
Patient ID: Tim [name], male   DOB: 1955-11-05, 56 y.o.   MRN: [id number] Pt stated that he was discharged to [facility] from [facility] apprx 1 yr ago. 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

## 2012-03-07 DIAGNOSIS — F1020 Alcohol dependence, uncomplicated: Secondary | ICD-10-CM

## 2012-03-07 NOTE — Progress Notes (Signed)
[facility] Group Notes:  (Counselor/Nursing/MHT/Case Management/Adjunct)  03/07/2012 12:48 PM  Type of Therapy:  Group Therapy  Participation Level:  Did Not Attend; patient came into group with less than five minutes remaining  Tlaloc Taddei, Julious Payer 03/07/2012, 12:48 PM

## 2012-03-07 NOTE — Progress Notes (Signed)
Pt attended discharge planning group and actively participated.  Pt presents with calm mood and affect.  Pt ranks depression and anxiety at a 7 today.  Pt denies SI.  Pt reports feeling down today.  Pt states that his d/c plan remains to f/u at [facility] for medication management and therapy, AA meetings and [facility].  SW provided pt on information on [employer] and [employer].  No further needs voiced by pt at this time.  Safety planning and suicide prevention discussed.    Reyes Ivan, [place] 03/07/2012  9:33 AM

## 2012-03-07 NOTE — Progress Notes (Signed)
[facility] Group Notes:  (Counselor/Nursing/MHT/Case Management/Adjunct)  03/07/2012 5:19 PM  Type of Therapy:  Group Therapy at 1:15  Participation Level:  Minimal  Participation Quality:  Sharing  Affect:  Appropriate  Cognitive:  Alert  Insight:  Limited  Engagement in Group:  Limited  Engagement in Therapy:  None  Modes of Intervention:  Orientation and Socialization  Summary of Progress/Problems: Patient came in late to group again with less than five minutes to go but did contribute to conversation. Tim [name] described his peer group; "we haven't changed a thing in the last 10 to 12 years other than our age; we drink smoke and hang out just like we did back them."  "I need and am beginning to want to improve the quality of the people I hang out with; the world is much bigger than the 3 or 4 people I limit myself to."   Tim [name] 03/07/2012, 5:19 PM

## 2012-03-07 NOTE — Progress Notes (Signed)
Patient ID: Tim [name], male   DOB: August 30, 1956, 56 y.o.   MRN: [id number] He has been up and about and to group, interacting with peers and staff, says that he is feeling better  He continues to be hopeless denies w/d symptoms and I.

## 2012-03-07 NOTE — Progress Notes (Signed)
Patient ID: Tim [name], male   DOB: 1956/06/04, 56 y.o.   MRN: [id number] Pt stated he'd had a good day. Stated that his plan is to volunteer at the shelter upon discharge.  Stated that when he volunteers at the shelter it helps him to keep his problems in perpective. Stated that when he sees everybody else's problems it makes him realize that "God is good".

## 2012-03-07 NOTE — Progress Notes (Signed)
Patient ID: Tim [name], male   DOB: 04/08/1956, 56 y.o.   MRN: [id number] Tim [name] presents well groomed, fully alert, and is walking very stiffly, having difficulty coming from sitting to standing motion. 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  His vital signs are normal, and he is no acute withdrawal symptoms. He rates depression a creatinine of 1-10 scale if 10 is the worst symptoms. He rates his anxiety as 7-8 on a 1-10 scale, denies hopelessness. And he expresses plans for the future.  He is embarrassed, and discouraged with his inability to "make my life work out right." He plans to move into his mother's house where he is welcome, and his dog is there. 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 He wants to be abstinent from alcohol and cocaine.  Mental status exam: Fully alert male, in full contact with reality no signs of confusion or delirium. No signs of acute withdrawal. Pulses normal and 76, blood pressure is normal at 112/76. Insight adequate. No dangerous ideas. Cognition is fully intact. Intelligence is average  Plan: Continue detox.  ELOS 1 day.

## 2012-03-08 MED ORDER — HYDROXYZINE HCL 25 MG PO TABS
25.0000 mg | ORAL_TABLET | Freq: Once | ORAL | Status: AC
  Administered 2012-03-08: 25 mg via ORAL
  Filled 2012-03-08 (×2): qty 1

## 2012-03-08 NOTE — Progress Notes (Signed)
[facility] Group Notes:  (Counselor/Nursing/MHT/Case Management/Adjunct)  03/08/2012 11:55 AM  Type of Therapy:  Group Therapy  Participation Level:  Minimal  Participation Quality:  Attentive and Sharing  Affect:  Blunted   Cognitive:  Alert and Oriented  Insight:  Limited  Engagement in Group:  Limited  Engagement in Therapy:  Limited  Modes of Intervention:  Clarification, Orientation, Problem-solving and Support  Summary of Progress/Problems:  Therapist began group with a brief check-in.  Therapist prompted Pt to verbalize one thing he would like to change about himself.  Patient actively participated by disclosing that he would like to be an independent thinker.  He explained that he has been institutionalized for 20 yrs. And is not able to make good decisions so other tell him what to do.  He expressed a desire to live in a group home and become more independent. Pt was receptive to feedback..  Therapist offered support and encouragement.    Christen Butter 03/08/2012, 11:55 AM

## 2012-03-08 NOTE — Progress Notes (Deleted)
[facility] Group Notes:  (Counselor/Nursing/MHT/Case Management/Adjunct)  03/08/2012 11:55 AM  Type of Therapy:  Group Therapy  Participation Level:  Did Not Attend   Christen Butter 03/08/2012, 11:55 AM

## 2012-03-08 NOTE — Progress Notes (Signed)
[facility] Group Notes:  (Counselor/Nursing/MHT/Case Management/Adjunct)  03/08/2012 2:00 PM  Type of Therapy:  Psycho/Edu Group  Participation Level: Did Not Attend   Tim [name] 03/08/2012, 2:00 PM  

## 2012-03-08 NOTE — Progress Notes (Signed)
Pt attended discharge planning group and actively participated.  Pt presents with flat affect and depressed mood.  Pt ranks depression at a 4 and anxiety at a 10 today.  Pt denies SI.  Pt looks noticeably more depressed today.  Pt states that he is very anxious today and has racing thoughts about things his future.  Pt will follow up at [facility] for medication management and therapy.  No further needs voiced by pt at this time.    Reyes Ivan, LCSWA 03/08/2012  9:47 AM

## 2012-03-08 NOTE — Progress Notes (Signed)
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

## 2012-03-08 NOTE — [facility] Suicide Risk Assessment (Signed)
Suicide Risk Assessment  Discharge Assessment      Demographic factors: Male;Caucasian;Low socioeconomic status  Current Mental Status Per Nursing Assessment: At Discharge:  Pt denied any SI/HI/thoughts of self harm or acute psychiatric issues in treatment team with clinical, nursing and medical team present.  Current Mental Status Per Physician:Patient seen and evaluated. Chart reviewed. Patient stated that his mood was "very good". His affect was mood congruent and euthymic.  Pt seen by this writer for discharge visit on 5/23 and then again by team on 03/09/12.  He denied any current thoughts of self injurious behavior, suicidal ideation or homicidal ideation. There were no auditory or visual hallucinations, paranoia, delusional thought processes, or mania noted.  Thought process was linear and goal directed.  No psychomotor agitation or retardation was noted. His speech was normal rate, tone and volume. Eye contact was good. Judgment and insight are fair.  Patient has been up and engaged on the unit.  No acute safety concerns reported from team.  Loss Factors:  Decrease in vocational status;Financial problems / change in socioeconomic status  Historical Factors: poor access to care; unemployed; medication noncompliance; Hx SI; no reported SIB/attempts  Risk Reduction Factors: Positive social support; potential job pending; transportation back  Discharge Diagnoses: AXIS I:  Polysubstance Dependence; Alcohol W/D, resolved; SIMD, improving AXIS II:  Deferred AXIS III:   Past Medical History  Diagnosis Date  . COPD (chronic obstructive pulmonary disease)   . Hep C w/o coma, chronic   . Mental health disorder   . Scoliosis    AXIS IV:  Moderate AXIS V: 50  Cognitive Features That Contribute To Risk: BIF, per past Hx.  Suicide Risk: Pt viewed as a chronic increased risk of harm to self in light of his past hx and risk factors.  No acute safety concerns on the unit.  Pt contracting for  safety and stable for discharge.  Detox complete.  Pt declining further residential Tx.  Plan Of Care/Follow-up recommendations: Pt seen and evaluated in treatment team. Chart reviewed.  Pt stable for and requesting discharge. Pt contracting for safety and does not currently meet [place] involuntary commitment criteria for continued hospitalization against his will.  Mental health treatment, medication management and continued sobriety will mitigate against the increased chronic risk of harm to self and/or others.  Discussed the importance of recovery further with pt, as well as, tools to move forward in a healthy & safe manner.  Pt agreeable with the plan.  Discussed with the team.  Please see orders, follow up appointments per [facility]) and full discharge summary to be completed by physician extender.  Recommend follow up with NA Environmental health practitioner and [employer]). Diet: Regular.  Activity: As tolerated.

## 2012-03-08 NOTE — Progress Notes (Signed)
Pt denies SI/HI/AVH. Pt rates his depression 0, hopelessness 3, and anxiety 0. Pt participated in groups and was visible on the unit today. Support and encouragement offered. Pt receptive.

## 2012-03-08 NOTE — Progress Notes (Signed)
03/08/2012         Time: 1415      Group Topic/Focus: The focus of this group is on discussing various styles of communication and communicating assertively using 'I' (feeling) statements.  Participation Level: Active  Participation Quality: Attentive  Affect: Blunted  Cognitive: Oriented  Additional Comments: None.  Tim [name] 03/08/2012 3:47 PM

## 2012-03-09 MED ORDER — ADULT MULTIVITAMIN W/MINERALS CH: 1.0000 | ORAL_TABLET | Freq: Every day | ORAL | Status: DC

## 2012-03-09 MED ORDER — FLUOXETINE HCL 40 MG PO CAPS: 40.0000 mg | ORAL_CAPSULE | Freq: Every day | ORAL | Status: DC

## 2012-03-09 MED ORDER — THIAMINE HCL 100 MG PO TABS: 100.0000 mg | ORAL_TABLET | Freq: Every day | ORAL | Status: DC

## 2012-03-09 NOTE — Progress Notes (Signed)
Patient ID: Tim [name], male   DOB: 12/24/1955, 56 y.o.   MRN: [id number] Pt discharged at this time,  Pt provided with supply of medications as well as prescriptions, pt denies SI/HI, discharge instructions provided and pt verbalized understanding, all belongings returned

## 2012-03-09 NOTE — Discharge Summary (Signed)
Physician Discharge Summary Note  Patient:  Tim [name] is an 56 y.o., male MRN:  [id number] DOB:  1956/01/11 Patient phone:  [phone] (home)  Patient address:   [address] [place] [address],   Date of Admission:  03/04/2012 Date of Discharge: 03/09/2012  Discharge Diagnoses:  AXIS I: Polysubstance Dependence; Alcohol W/D, resolved; SIMD, improving  AXIS II: Deferred  AXIS III:  Past Medical History   Diagnosis  Date   .  COPD (chronic obstructive pulmonary disease)    .  Hep C w/o coma, chronic    .  Mental health disorder    .  Scoliosis    AXIS IV: Moderate  AXIS V: 50  Level of Care:  OP  Hospital Course:  This is one of several admissions for Tim [name] who presents requesting detox from alcohol. He'd been drinking significant amounts of beer most every day of the week. He has also used marijuana since age 62 he says he uses a regular basis is he is using cocaine less than 1 g about 3 times a month since he was age 50.Please refer to the psychiatric admission note for details regarding the events and circumstances leading to admission.    He was admitted to our dual diagnosis unit and Prozac was continued at 40 mg daily to address his depression symptoms. He was uneventful he detox using a Librium detox protocol which he tolerated well. He revealed that his job was a major relapse trigger because he had to ride with other workers who kept stopping atdrug house is on the way home to get drugs and alcohol. He plans on quitting work and living with his mother. He consistently and convincingly denies any suicidal thoughts throughout his stay.  Group therapy participation was satisfactory, and he worked with our counselors to identify a relapse prevention program, and support measures for maintaining sobriety after discharge.   Consults:  None  Significant Diagnostic Studies:  Initial alcohol screen 145 mg percent. Urine drug screen positive for cocaine and  Cannabis metabolites.  Discharge Vitals:   Blood pressure 113/76, pulse 72, temperature 98.2 F (36.8 C), temperature source Oral, resp. rate 16, height 6' 1.5" (1.867 m), weight 78.019 kg (172 lb).  Mental Status Exam: See Mental Status Examination and Suicide Risk Assessment completed by Attending Physician prior to discharge.  Discharge destination:  Home  Is patient on multiple antipsychotic therapies at discharge:  No   Has Patient had three or more failed trials of antipsychotic monotherapy by history:  No  Recommended Plan for Multiple Antipsychotic Therapies: N/A   Medication List  As of 03/09/2012 10:18 AM   STOP taking these medications         hydrOXYzine 25 MG capsule         TAKE these medications      Indication    FLUoxetine 40 MG capsule   Commonly known as: PROZAC   Take 1 capsule (40 mg total) by mouth daily. For depression and anxiety       mulitivitamin with minerals Tabs   Take 1 tablet by mouth daily. Vitamin supplement       thiamine 100 MG tablet   Take 1 tablet (100 mg total) by mouth daily. Vitamin supplement            Follow-up Information    Follow up with [facility].   Contact information:   [address], [place] [address] [phone]      Follow  up with AA meetings. (See brochure for meeting times)          Follow-up recommendations:  Activity:  unrestricted Diet:  regular  Signed: Kyce [name] A 03/09/2012, 10:18 AM

## 2012-03-09 NOTE — Progress Notes (Signed)
[facility] Case Management Discharge Plan:  Will you be returning to the same living situation after discharge: Yes,  return home with mother At discharge, do you have transportation home?:Yes,  access to transportation Do you have the ability to pay for your medications:Yes,  access to meds   Release of information consent forms completed and in the chart;  Patient's signature needed at discharge.  Patient to Follow up at:  Follow-up Information    Follow up with [facility] on 03/16/2012. (Appointment scheduled at 2:00 pm with Maralyn Sago, arrive 15 minutes early)    Contact [address]:   [address], [place] [address] [phone]      Follow up with AA meetings. (See brochure for meeting times)          Patient denies SI/HI:   Yes,  denies SI/HI    Safety Planning and Suicide Prevention discussed:  Yes,  discussed with pt today  Barrier to discharge identified:No.  Summary and Recommendations: Pt attended discharge planning group and actively participated.  Pt presents with calm mood and affect.  Pt ranks depression at a 5 and anxiety at a 10 but reports feeling stable to d/c today.  Pt denies SI.  No recommendations from SW.  No further needs voiced by pt.  Pt stable to discharge.     Carmina Miller 03/09/2012, 10:44 AM

## 2012-03-09 NOTE — Progress Notes (Signed)
Lying quietly in bed with eyes closed.  Q 15 minute checks are being conducted to maintain safety. 

## 2012-03-09 NOTE — Progress Notes (Signed)
[facility] Group Notes:  (Counselor/Nursing/MHT/Case Management/Adjunct)  03/09/2012 1:00 PM  Type of Therapy:  Psych/Edu Group Therapy  Participation Level:  Did not attend    Tim [name] 03/09/2012, 2:00 PM  

## 2012-03-09 NOTE — Progress Notes (Signed)
[facility] Group Notes:  (Counselor/Nursing/MHT/Case Management/Adjunct)  03/09/2012 11:00 AM  Type of Therapy:  Group Therapy  Participation Level:  Did not attend   Danette Weinfeld C 03/09/2012, 12:00 PM  

## 2012-03-09 NOTE — Tx Team (Signed)
Interdisciplinary Treatment Plan Update (Adult)  Date:  03/09/2012  Time Reviewed:  10:04 AM   Progress in Treatment: Attending groups: Yes Participating in groups:  Yes Taking medication as prescribed: Yes Tolerating medication:  Yes Family/Significant othe contact made:  Yes Patient understands diagnosis:  Yes Discussing patient identified problems/goals with staff:  Yes Medical problems stabilized or resolved:  Yes Denies suicidal/homicidal ideation: Yes Issues/concerns per patient self-inventory:  None identified Other: N/A  New problem(s) identified: None Identified  Reason for Continuation of Hospitalization: Stable to d/c  Interventions implemented related to continuation of hospitalization: Stable to d/c  Additional comments: N/A  Estimated length of stay: D/C today  Discharge Plan: Pt will follow up with [facility] for medication management and therapy and AA meetings.   New goal(s): N/A  Review of initial/current patient goals per problem list:    1.  Goal(s): Address substance use  Met:  Yes  Target date: by discharge  As evidenced by: completing detox protocol and refer to appropriate treatment  2.  Goal (s): Reduce depressive and anxiety symptoms  Met:  No  Target date: by discharge  As evidenced by: Reducing depression from a 10 to a 3 as reported by pt.  Pt ranks depression at a 5 and anxiety at a 10 today but reports feeling stable to d/c.    3.  Goal(s): Eliminate SI  Met:  Yes  Target date: by discharge  As evidenced by: Pt denies SI.    Attendees: Patient:  Tim [name] 03/09/2012 10:06 AM   Family:     Physician:  [name], DO 03/09/2012 10:04 AM   Nursing: Berneice Heinrich, RN 03/09/2012 10:04 AM   Case Manager:  Reyes Ivan, LCSWA 03/09/2012  10:04 AM   Counselor:  Marni Griffon, LCAS 03/09/2012  10:04 AM   Other:  Richelle Ito, LCSW 03/09/2012 10:04 AM   Other:     Other:     Other:      Scribe for Treatment Team:   Reyes Ivan 03/09/2012 10:04 AM -

## 2012-03-13 NOTE — Progress Notes (Signed)
Patient Discharge Instructions:  No consent for Tim [name], 03/13/2012, 4:00 PM

## 2012-04-06 ENCOUNTER — Encounter (HOSPITAL_COMMUNITY): Payer: Self-pay | Admitting: *Deleted

## 2012-04-06 ENCOUNTER — Emergency Department (HOSPITAL_COMMUNITY)
Admission: EM | Admit: 2012-04-06 | Discharge: 2012-04-07 | Disposition: A | Discharge status: Home / Self Care | Payer: Self-pay | Attending: Emergency Medicine | Admitting: Emergency Medicine

## 2012-04-06 DIAGNOSIS — J449 Chronic obstructive pulmonary disease, unspecified: Secondary | ICD-10-CM | POA: Insufficient documentation

## 2012-04-06 DIAGNOSIS — R45850 Homicidal ideations: Secondary | ICD-10-CM | POA: Insufficient documentation

## 2012-04-06 DIAGNOSIS — J4489 Other specified chronic obstructive pulmonary disease: Secondary | ICD-10-CM | POA: Insufficient documentation

## 2012-04-06 DIAGNOSIS — F1910 Other psychoactive substance abuse, uncomplicated: Secondary | ICD-10-CM | POA: Insufficient documentation

## 2012-04-06 DIAGNOSIS — B1920 Unspecified viral hepatitis C without hepatic coma: Secondary | ICD-10-CM | POA: Insufficient documentation

## 2012-04-06 DIAGNOSIS — F17200 Nicotine dependence, unspecified, uncomplicated: Secondary | ICD-10-CM | POA: Insufficient documentation

## 2012-04-06 DIAGNOSIS — F489 Nonpsychotic mental disorder, unspecified: Secondary | ICD-10-CM | POA: Insufficient documentation

## 2012-04-06 LAB — RAPID URINE DRUG SCREEN, HOSP PERFORMED
Amphetamines: NOT DETECTED
Cocaine: POSITIVE — AB
Opiates: NOT DETECTED
Tetrahydrocannabinol: POSITIVE — AB

## 2012-04-06 LAB — COMPREHENSIVE METABOLIC PANEL
AST: 20 U/L (ref 0–37)
Albumin: 4.1 g/dL (ref 3.5–5.2)
Calcium: 9.7 mg/dL (ref 8.4–10.5)
Creatinine, Ser: 1.09 mg/dL (ref 0.50–1.35)

## 2012-04-06 LAB — CBC
MCH: 31.8 pg (ref 26.0–34.0)
MCHC: 34.6 g/dL (ref 30.0–36.0)
MCV: 92 fL (ref 78.0–100.0)
Platelets: 231 10*3/uL (ref 150–400)
RDW: 13.8 % (ref 11.5–15.5)

## 2012-04-06 LAB — ETHANOL: Alcohol, Ethyl (B): 136 mg/dL — ABNORMAL HIGH (ref 0–11)
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
## 2012-04-07 MED ORDER — FLUOXETINE HCL 20 MG PO CAPS
40.0000 mg | ORAL_CAPSULE | Freq: Every day | ORAL | Status: DC
  Administered 2012-04-07: 40 mg via ORAL
  Filled 2012-04-07: qty 2

## 2012-04-07 MED ORDER — ACETAMINOPHEN 325 MG PO TABS: 650.0000 mg | ORAL_TABLET | ORAL | Status: DC | PRN

## 2012-04-07 MED ORDER — LORAZEPAM 1 MG PO TABS: 1.0000 mg | ORAL_TABLET | Freq: Three times a day (TID) | ORAL | Status: DC | PRN

## 2012-04-07 MED ORDER — IBUPROFEN 600 MG PO TABS: 600.0000 mg | ORAL_TABLET | Freq: Three times a day (TID) | ORAL | Status: DC | PRN

## 2012-04-07 MED ORDER — ZOLPIDEM TARTRATE 5 MG PO TABS: 5.0000 mg | ORAL_TABLET | Freq: Every evening | ORAL | Status: DC | PRN

## 2012-04-07 MED ORDER — ONDANSETRON HCL 4 MG PO TABS: 4.0000 mg | ORAL_TABLET | Freq: Three times a day (TID) | ORAL | Status: DC | PRN

## 2012-04-07 MED ORDER — ALUM & MAG HYDROXIDE-SIMETH 200-200-20 MG/5ML PO SUSP: 30.0000 mL | ORAL | Status: DC | PRN

## 2012-04-07 MED ORDER — NICOTINE 21 MG/24HR TD PT24
21.0000 mg | MEDICATED_PATCH | Freq: Every day | TRANSDERMAL | Status: DC
  Administered 2012-04-07: 21 mg via TRANSDERMAL
  Filled 2012-04-07: qty 1

## 2012-04-07 MED ORDER — VITAMIN B-1 100 MG PO TABS
100.0000 mg | ORAL_TABLET | Freq: Every day | ORAL | Status: DC
  Administered 2012-04-07: 100 mg via ORAL
  Filled 2012-04-07: qty 1

## 2012-04-07 MED ORDER — ADULT MULTIVITAMIN W/MINERALS CH
1.0000 | ORAL_TABLET | Freq: Every day | ORAL | Status: DC
  Administered 2012-04-07: 1 via ORAL
  Filled 2012-04-07: qty 1

## 2012-04-07 NOTE — ED Notes (Signed)
Patient belongings placed in locker 36.  

## 2012-04-07 NOTE — ED Provider Notes (Signed)
Pt denies SI/HI. Well appearing. Will d/c home with outpt referrals. Doesn't meet inpatient detox criteria.   Loren Racer, MD 04/07/12 1104

## 2012-04-07 NOTE — ED Provider Notes (Signed)
History     CSN: [id number]  Arrival date & time 04/06/12  2217   12:32 AM HPI Patient reports he is here for several mental health problems. Does not elaborate but states he's had severe anger issues. Currently denies suicidal ideation or homicidal ideation according to triage for patient complained suicidal ideation homicidal ideation. Denies hallucinations. Reports taking Prozac as prescribed.  Patient is a 56 y.o. male presenting with mental health disorder. The history is provided by the patient.  Mental Health Problem The primary symptoms do not include delusions or hallucinations. The current episode started today. This is a recurrent problem.  The onset of the illness is precipitated by alcohol abuse and drug abuse. The degree of incapacity that he is experiencing as a consequence of his illness is moderate. Additional symptoms of the illness include poor judgment. He does not admit to suicidal ideas. He does not have a plan to commit suicide. He does not contemplate harming himself. He has not already injured self. He does not contemplate injuring another person. He has not already  injured another person. Risk factors that are present for mental illness include a history of mental illness and substance abuse.    Past Medical History  Diagnosis Date  . COPD (chronic obstructive pulmonary disease)   . Hep C w/o coma, chronic   . Mental health disorder   . Scoliosis     Past Surgical History  Procedure Date  . Thyroid surgery   . Skin cancer excision     No family history on file.  History  Substance Use Topics  . Smoking status: Current Everyday Smoker -- 1.0 packs/day for 40 years    Types: Cigarettes  . Smokeless tobacco: Not on file  . Alcohol Use: Yes     12/daily      Review of Systems  Psychiatric/Behavioral: Positive for behavioral problems. Negative for suicidal ideas, hallucinations and self-injury.  All other systems reviewed and are  negative.    Allergies  Review of patient's allergies indicates no known allergies.  Home Medications   Current Outpatient Rx  Name Route Sig Dispense Refill  . FLUOXETINE HCL 40 MG PO CAPS Oral Take 1 capsule (40 mg total) by mouth daily. For depression and anxiety 30 capsule 0  . ADULT MULTIVITAMIN W/MINERALS CH Oral Take 1 tablet by mouth daily. Vitamin supplement    . THIAMINE HCL 100 MG PO TABS Oral Take 1 tablet (100 mg total) by mouth daily. Vitamin supplement      BP 92/57  Pulse 67  Temp 98.2 F (36.8 C) (Oral)  Resp 16  SpO2 96%  Physical Exam  Constitutional: He is oriented to person, place, and time. He appears well-developed and well-nourished.  HENT:  Head: Normocephalic and atraumatic.  Eyes: Pupils are equal, round, and reactive to light.  Neurological: He is alert and oriented to person, place, and time.  Skin: Skin is warm and dry. No rash noted. No erythema. No pallor.  Psychiatric: His speech is normal. His mood appears anxious. He is agitated.       Patient seems anxious and agitated. Completely denies suicide, homicide ideation with me. Suspect episode of mania.    ED Course  Procedures  Results for orders placed during the hospital encounter of 04/06/12  CBC      Component Value Range   WBC 7.1  4.0 - 10.5 K/uL   RBC 4.75  4.22 - 5.81 MIL/uL   Hemoglobin 15.1  13.0 - 17.0  g/dL   HCT 29.5  62.1 - 30.8 %   MCV 92.0  78.0 - 100.0 fL   MCH 31.8  26.0 - 34.0 pg   MCHC 34.6  30.0 - 36.0 g/dL   RDW 65.7  84.6 - 96.2 %   Platelets 231  150 - 400 K/uL  COMPREHENSIVE METABOLIC PANEL      Component Value Range   Sodium 134 (*) 135 - 145 mEq/L   Potassium 3.7  3.5 - 5.1 mEq/L   Chloride 100  96 - 112 mEq/L   CO2 22  19 - 32 mEq/L   Glucose, Bld 88  70 - 99 mg/dL   BUN 12  6 - 23 mg/dL   Creatinine, Ser 9.52  0.50 - 1.35 mg/dL   Calcium 9.7  8.4 - 84.1 mg/dL   Total Protein 7.3  6.0 - 8.3 g/dL   Albumin 4.1  3.5 - 5.2 g/dL   AST 20  0 - 37 U/L    ALT 15  0 - 53 U/L   Alkaline Phosphatase 75  39 - 117 U/L   Total Bilirubin 0.2 (*) 0.3 - 1.2 mg/dL   GFR calc non Af Amer 75 (*) >90 mL/min   GFR calc Af Amer 87 (*) >90 mL/min  ETHANOL      Component Value Range   Alcohol, Ethyl (B) 136 (*) 0 - 11 mg/dL  URINE RAPID DRUG SCREEN (HOSP PERFORMED)      Component Value Range   Opiates NONE DETECTED  NONE DETECTED   Cocaine POSITIVE (*) NONE DETECTED   Benzodiazepines NONE DETECTED  NONE DETECTED   Amphetamines NONE DETECTED  NONE DETECTED   Tetrahydrocannabinol POSITIVE (*) NONE DETECTED   Barbiturates NONE DETECTED  NONE DETECTED   MDM   Discussed patient with Judeth Cornfield, ACT who will evaluate the patient.  Pt signed out to Dr Read Drivers     Thomasene Lot, PA-C 04/07/12 0041
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
## 2012-04-07 NOTE — ED Provider Notes (Signed)
Medical screening examination/treatment/procedure(s) were performed by non-physician practitioner and as supervising physician I was immediately available for consultation/collaboration.   Ryley Bachtel L Ranier Coach, MD 04/07/12 0807 

## 2012-04-07 NOTE — ED Notes (Signed)
Pt denies SI/HI/AVH and is up for discharge per MD orders. Pt is being discharged home. Pt stated he didn't understand why they brought him to the hospital anyway.

## 2012-05-22 ENCOUNTER — Encounter (HOSPITAL_COMMUNITY): Payer: Self-pay | Admitting: Emergency Medicine

## 2012-05-22 ENCOUNTER — Emergency Department (HOSPITAL_COMMUNITY)
Admission: EM | Admit: 2012-05-22 | Discharge: 2012-05-24 | Disposition: A | Discharge status: Home / Self Care | Payer: Self-pay | Attending: Emergency Medicine | Admitting: Emergency Medicine

## 2012-05-22 DIAGNOSIS — J449 Chronic obstructive pulmonary disease, unspecified: Secondary | ICD-10-CM | POA: Insufficient documentation

## 2012-05-22 DIAGNOSIS — F1010 Alcohol abuse, uncomplicated: Secondary | ICD-10-CM | POA: Insufficient documentation

## 2012-05-22 DIAGNOSIS — F329 Major depressive disorder, single episode, unspecified: Secondary | ICD-10-CM | POA: Insufficient documentation

## 2012-05-22 DIAGNOSIS — F3289 Other specified depressive episodes: Secondary | ICD-10-CM | POA: Insufficient documentation

## 2012-05-22 DIAGNOSIS — F17200 Nicotine dependence, unspecified, uncomplicated: Secondary | ICD-10-CM | POA: Insufficient documentation

## 2012-05-22 DIAGNOSIS — F10929 Alcohol use, unspecified with intoxication, unspecified: Secondary | ICD-10-CM

## 2012-05-22 DIAGNOSIS — J4489 Other specified chronic obstructive pulmonary disease: Secondary | ICD-10-CM | POA: Insufficient documentation

## 2012-05-22 LAB — CBC
Hemoglobin: 15.4 g/dL (ref 13.0–17.0)
MCH: 32 pg (ref 26.0–34.0)
MCHC: 35.6 g/dL (ref 30.0–36.0)
MCV: 89.8 fL (ref 78.0–100.0)
Platelets: 249 10*3/uL (ref 150–400)

## 2012-05-22 LAB — URINALYSIS, ROUTINE W REFLEX MICROSCOPIC
Bilirubin Urine: NEGATIVE
Glucose, UA: NEGATIVE mg/dL
Hgb urine dipstick: NEGATIVE
Specific Gravity, Urine: 1.009 (ref 1.005–1.030)
pH: 5.5 (ref 5.0–8.0)

## 2012-05-22 LAB — COMPREHENSIVE METABOLIC PANEL
ALT: 13 U/L (ref 0–53)
Calcium: 9.6 mg/dL (ref 8.4–10.5)
Creatinine, Ser: 0.94 mg/dL (ref 0.50–1.35)
GFR calc Af Amer: >90 mL/min (ref >90)
Glucose, Bld: 93 mg/dL (ref 70–99)
Sodium: 136 mEq/L (ref 135–145)
Total Protein: 6.7 g/dL (ref 6.0–8.3)

## 2012-05-22 LAB — RAPID URINE DRUG SCREEN, HOSP PERFORMED
Cocaine: NOT DETECTED
Opiates: NOT DETECTED
Tetrahydrocannabinol: NOT DETECTED

## 2012-05-22 LAB — ACETAMINOPHEN LEVEL: Acetaminophen (Tylenol), Serum: <15 ug/mL (ref 10–30)

## 2012-05-22 LAB — ETHANOL: Alcohol, Ethyl (B): 182 mg/dL — ABNORMAL HIGH (ref 0–11)

## 2012-05-22 MED ORDER — THIAMINE HCL 100 MG/ML IJ SOLN: 100.0000 mg | Freq: Every day | INTRAMUSCULAR | Status: DC

## 2012-05-22 MED ORDER — LORAZEPAM 1 MG PO TABS: 0.0000 mg | ORAL_TABLET | Freq: Two times a day (BID) | ORAL | Status: DC

## 2012-05-22 MED ORDER — LORAZEPAM 1 MG PO TABS: 0.0000 mg | ORAL_TABLET | Freq: Four times a day (QID) | ORAL | Status: DC

## 2012-05-22 MED ORDER — ALUM & MAG HYDROXIDE-SIMETH 200-200-20 MG/5ML PO SUSP: 30.0000 mL | ORAL | Status: DC | PRN

## 2012-05-22 MED ORDER — ZOLPIDEM TARTRATE 5 MG PO TABS: 5.0000 mg | ORAL_TABLET | Freq: Every evening | ORAL | Status: DC | PRN

## 2012-05-22 MED ORDER — ACETAMINOPHEN 325 MG PO TABS: 650.0000 mg | ORAL_TABLET | ORAL | Status: DC | PRN

## 2012-05-22 MED ORDER — FOLIC ACID 1 MG PO TABS
1.0000 mg | ORAL_TABLET | Freq: Every day | ORAL | Status: DC
  Administered 2012-05-23: 1 mg via ORAL
  Filled 2012-05-22: qty 1

## 2012-05-22 MED ORDER — NICOTINE 21 MG/24HR TD PT24
21.0000 mg | MEDICATED_PATCH | Freq: Every day | TRANSDERMAL | Status: DC
  Administered 2012-05-23: 21 mg via TRANSDERMAL
  Filled 2012-05-22: qty 1

## 2012-05-22 MED ORDER — LORAZEPAM 2 MG/ML IJ SOLN: 1.0000 mg | Freq: Four times a day (QID) | INTRAMUSCULAR | Status: DC | PRN

## 2012-05-22 MED ORDER — IBUPROFEN 600 MG PO TABS
600.0000 mg | ORAL_TABLET | Freq: Three times a day (TID) | ORAL | Status: DC | PRN
  Administered 2012-05-23: 600 mg via ORAL
  Filled 2012-05-22: qty 1

## 2012-05-22 MED ORDER — ADULT MULTIVITAMIN W/MINERALS CH
1.0000 | ORAL_TABLET | Freq: Every day | ORAL | Status: DC
  Administered 2012-05-23: 1 via ORAL
  Filled 2012-05-22: qty 1

## 2012-05-22 MED ORDER — VITAMIN B-1 100 MG PO TABS
100.0000 mg | ORAL_TABLET | Freq: Every day | ORAL | Status: DC
  Administered 2012-05-23: 100 mg via ORAL
  Filled 2012-05-22: qty 1

## 2012-05-22 MED ORDER — ONDANSETRON HCL 4 MG PO TABS: 4.0000 mg | ORAL_TABLET | Freq: Three times a day (TID) | ORAL | Status: DC | PRN

## 2012-05-22 MED ORDER — LORAZEPAM 1 MG PO TABS: 1.0000 mg | ORAL_TABLET | Freq: Four times a day (QID) | ORAL | Status: DC | PRN

## 2012-05-22 NOTE — ED Notes (Signed)
Wants detox off ETOH.  Last drink was 1 hr ago. States has had 4 x 40's today.  Now is homeless.

## 2012-05-22 NOTE — ED Notes (Signed)
States that he drinks alcohol everyday 3-4 40 oz beers daily. Wants detox.

## 2012-05-23 ENCOUNTER — Inpatient Hospital Stay (HOSPITAL_COMMUNITY)
Admission: AD | Admit: 2012-05-23 | Payer: Federal, State, Local not specified - Other | Source: Ambulatory Visit | Admitting: Psychiatry

## 2012-05-23 MED ORDER — ADULT MULTIVITAMIN W/MINERALS CH: 1.0000 | ORAL_TABLET | Freq: Every day | ORAL | Status: DC

## 2012-05-23 MED ORDER — VITAMIN B-1 100 MG PO TABS: 100.0000 mg | ORAL_TABLET | Freq: Every day | ORAL | Status: DC

## 2012-05-23 MED ORDER — FLUOXETINE HCL 20 MG PO CAPS
40.0000 mg | ORAL_CAPSULE | Freq: Every day | ORAL | Status: DC
  Administered 2012-05-23: 40 mg via ORAL
  Filled 2012-05-23: qty 2

## 2012-05-23 NOTE — ED Notes (Signed)
PT. Belonging in locker#39
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
## 2012-05-23 NOTE — ED Notes (Signed)
Pt states "been drinking since I was 12, no work, no pay, dast drink was yesterday about 3, smoke <ppd, used marijuana day before yesterday, drank 4-40's yesterday, live with my mom and my dog, get my medicines from [facility], have Hep C and depression"

## 2012-05-23 NOTE — ED Provider Notes (Signed)
History     CSN: [id number]  Arrival date & time 05/22/12  1952   First MD Initiated Contact with Patient 05/22/12 2329      Chief Complaint  Patient presents with  . Medical Clearance    (Consider location/radiation/quality/duration/timing/severity/associated sxs/prior treatment) HPI 56 year old male comes in for help with alcohol abuse. He he states that he has been drinking heavily for the last 3 days. He admits to drinking 20 ounces of beer today. He has been through detox an estimated 25 times and he wishes to go through detox again. He denies any other drug use. He does admit to depression with depressive symptoms of crying spells, early morning awakening, and anhedonia. He denies homicidal ideation and suicidal ideation. He denies visual and auditory hallucinations. Past Medical History  Diagnosis Date  . COPD (chronic obstructive pulmonary disease)   . Hep C w/o coma, chronic   . Mental health disorder   . Scoliosis     Past Surgical History  Procedure Date  . Thyroid surgery   . Skin cancer excision     No family history on file.  History  Substance Use Topics  . Smoking status: Current Everyday Smoker -- 1.0 packs/day for 40 years    Types: Cigarettes  . Smokeless tobacco: Not on file  . Alcohol Use: Yes     12/daily      Review of Systems  All other systems reviewed and are negative.    Allergies  Review of patient's allergies indicates no known allergies.  Home Medications   Current Outpatient Rx  Name Route Sig Dispense Refill  . FLUOXETINE HCL 40 MG PO CAPS Oral Take 1 capsule (40 mg total) by mouth daily. For depression and anxiety 30 capsule 0  . ADULT MULTIVITAMIN W/MINERALS CH Oral Take 1 tablet by mouth daily. Vitamin supplement    . THIAMINE HCL 100 MG PO TABS Oral Take 1 tablet (100 mg total) by mouth daily. Vitamin supplement      BP 99/59  Pulse 73  Temp 98.1 F (36.7 C) (Oral)  Resp 20  SpO2 97%  Physical Exam  Nursing note  and vitals reviewed. 56year old male, resting comfortably and in no acute distress. There is a moderate odor of ethanol on his breath. Vital signs are normal. Oxygen saturation is 97%, which is normal. Head is normocephalic and atraumatic. PERRLA, EOMI. Oropharynx is clear. Neck is nontender and supple without adenopathy or JVD. Back is nontender and there is no CVA tenderness. Lungs are clear without rales, wheezes, or rhonchi. Chest is nontender. Heart has regular rate and rhythm without murmur. Abdomen is soft, flat, nontender without masses or hepatosplenomegaly and peristalsis is normoactive. Extremities have no cyanosis or edema, full range of motion is present. Skin is warm and dry without rash. Neurologic: Mental status is significant for evidence of alcohol intoxication, cranial nerves are intact, there are no motor or sensory deficits. He does not appear to be depressed.   ED Course  Procedures (including critical care time)  Results for orders placed during the hospital encounter of 05/22/12  CBC      Component Value Range   WBC 6.8  4.0 - 10.5 K/uL   RBC 4.82  4.22 - 5.81 MIL/uL   Hemoglobin 15.4  13.0 - 17.0 g/dL   HCT 91.4  78.2 - 95.6 %   MCV 89.8  78.0 - 100.0 fL   MCH 32.0  26.0 - 34.0 pg   MCHC 35.6  30.0 -  36.0 g/dL   RDW 47.8  29.5 - 62.1 %   Platelets 249  150 - 400 K/uL  COMPREHENSIVE METABOLIC PANEL      Component Value Range   Sodium 136  135 - 145 mEq/L   Potassium 3.5  3.5 - 5.1 mEq/L   Chloride 101  96 - 112 mEq/L   CO2 23  19 - 32 mEq/L   Glucose, Bld 93  70 - 99 mg/dL   BUN 8  6 - 23 mg/dL   Creatinine, Ser 3.08  0.50 - 1.35 mg/dL   Calcium 9.6  8.4 - 65.7 mg/dL   Total Protein 6.7  6.0 - 8.3 g/dL   Albumin 3.8  3.5 - 5.2 g/dL   AST 18  0 - 37 U/L   ALT 13  0 - 53 U/L   Alkaline Phosphatase 69  39 - 117 U/L   Total Bilirubin 0.2 (*) 0.3 - 1.2 mg/dL   GFR calc non Af Amer >90  >90 mL/min   GFR calc Af Amer >90  >90 mL/min  ETHANOL       Component Value Range   Alcohol, Ethyl (B) 182 (*) 0 - 11 mg/dL  ACETAMINOPHEN LEVEL      Component Value Range   Acetaminophen (Tylenol), Serum <15.0  10 - 30 ug/mL  URINE RAPID DRUG SCREEN (HOSP PERFORMED)      Component Value Range   Opiates NONE DETECTED  NONE DETECTED   Cocaine NONE DETECTED  NONE DETECTED   Benzodiazepines NONE DETECTED  NONE DETECTED   Amphetamines NONE DETECTED  NONE DETECTED   Tetrahydrocannabinol NONE DETECTED  NONE DETECTED   Barbiturates NONE DETECTED  NONE DETECTED  URINALYSIS, ROUTINE W REFLEX MICROSCOPIC      Component Value Range   Color, Urine YELLOW  YELLOW   APPearance CLOUDY (*) CLEAR   Specific Gravity, Urine 1.009  1.005 - 1.030   pH 5.5  5.0 - 8.0   Glucose, UA NEGATIVE  NEGATIVE mg/dL   Hgb urine dipstick NEGATIVE  NEGATIVE   Bilirubin Urine NEGATIVE  NEGATIVE   Ketones, ur NEGATIVE  NEGATIVE mg/dL   Protein, ur NEGATIVE  NEGATIVE mg/dL   Urobilinogen, UA 0.2  0.0 - 1.0 mg/dL   Nitrite NEGATIVE  NEGATIVE   Leukocytes, UA NEGATIVE  NEGATIVE     1. Alcohol intoxication   2. Alcohol abuse   3. Depression       MDM  Alcohol abuse with depression. I do not believe that his depression is severe enough to warrant hospitalization. He was placed on the alcohol withdrawal prevention protocol and consultation be obtained with [facility] Team. Old records are reviewed and he has multiple ED visits with the same presentation.        Dione Booze, MD 05/23/12 0005

## 2012-05-23 NOTE — ED Notes (Signed)
Awaiting pt's arrival to unit. 

## 2012-05-24 NOTE — ED Notes (Signed)
[facility] reports that pt has been accepted to [facility] and that the driver will pick him up shortly.

## 2012-11-08 ENCOUNTER — Emergency Department (HOSPITAL_COMMUNITY)
Admission: EM | Admit: 2012-11-08 | Discharge: 2012-11-08 | Disposition: A | Discharge status: Home / Self Care | Payer: Self-pay | Attending: Emergency Medicine | Admitting: Emergency Medicine

## 2012-11-08 ENCOUNTER — Emergency Department (HOSPITAL_COMMUNITY): Payer: Self-pay

## 2012-11-08 ENCOUNTER — Encounter (HOSPITAL_COMMUNITY): Payer: Self-pay | Admitting: Emergency Medicine

## 2012-11-08 DIAGNOSIS — Z79899 Other long term (current) drug therapy: Secondary | ICD-10-CM | POA: Insufficient documentation

## 2012-11-08 DIAGNOSIS — M722 Plantar fascial fibromatosis: Secondary | ICD-10-CM | POA: Insufficient documentation

## 2012-11-08 DIAGNOSIS — F17200 Nicotine dependence, unspecified, uncomplicated: Secondary | ICD-10-CM | POA: Insufficient documentation

## 2012-11-08 DIAGNOSIS — Z8619 Personal history of other infectious and parasitic diseases: Secondary | ICD-10-CM | POA: Insufficient documentation

## 2012-11-08 DIAGNOSIS — IMO0001 Reserved for inherently not codable concepts without codable children: Secondary | ICD-10-CM | POA: Insufficient documentation

## 2012-11-08 DIAGNOSIS — Z8739 Personal history of other diseases of the musculoskeletal system and connective tissue: Secondary | ICD-10-CM | POA: Insufficient documentation

## 2012-11-08 DIAGNOSIS — F1210 Cannabis abuse, uncomplicated: Secondary | ICD-10-CM | POA: Insufficient documentation

## 2012-11-08 DIAGNOSIS — R05 Cough: Secondary | ICD-10-CM | POA: Insufficient documentation

## 2012-11-08 DIAGNOSIS — Z8659 Personal history of other mental and behavioral disorders: Secondary | ICD-10-CM | POA: Insufficient documentation

## 2012-11-08 DIAGNOSIS — R0602 Shortness of breath: Secondary | ICD-10-CM | POA: Insufficient documentation

## 2012-11-08 DIAGNOSIS — F1410 Cocaine abuse, uncomplicated: Secondary | ICD-10-CM | POA: Insufficient documentation

## 2012-11-08 DIAGNOSIS — J449 Chronic obstructive pulmonary disease, unspecified: Secondary | ICD-10-CM | POA: Insufficient documentation

## 2012-11-08 DIAGNOSIS — J4489 Other specified chronic obstructive pulmonary disease: Secondary | ICD-10-CM | POA: Insufficient documentation

## 2012-11-08 DIAGNOSIS — R059 Cough, unspecified: Secondary | ICD-10-CM | POA: Insufficient documentation

## 2012-11-08 DIAGNOSIS — J40 Bronchitis, not specified as acute or chronic: Secondary | ICD-10-CM | POA: Insufficient documentation

## 2012-11-08 DIAGNOSIS — M79609 Pain in unspecified limb: Secondary | ICD-10-CM | POA: Insufficient documentation

## 2012-11-08 MED ORDER — PREDNISONE 20 MG PO TABS
60.0000 mg | ORAL_TABLET | Freq: Once | ORAL | Status: AC
  Administered 2012-11-08: 60 mg via ORAL
  Filled 2012-11-08: qty 3

## 2012-11-08 MED ORDER — ALBUTEROL SULFATE HFA 108 (90 BASE) MCG/ACT IN AERS
2.0000 | INHALATION_SPRAY | Freq: Once | RESPIRATORY_TRACT | Status: AC
  Administered 2012-11-08: 2 via RESPIRATORY_TRACT
  Filled 2012-11-08: qty 6.7

## 2012-11-08 MED ORDER — NAPROXEN 375 MG PO TABS: 375.0000 mg | ORAL_TABLET | Freq: Two times a day (BID) | ORAL | Status: DC

## 2012-11-08 NOTE — ED Notes (Addendum)
Pt presents to ED with c/o right heel pain since September. Patient also reports right sided chest pain since last week. Patient states he can feel the pain in his chest when he coughs.

## 2012-11-08 NOTE — ED Provider Notes (Signed)
History     CSN: [id number]  Arrival date & time 11/08/12  0940   First MD Initiated Contact with Patient 11/08/12 1052      Chief Complaint  Patient presents with  . Foot Pain  . Chest Pain    (Consider location/radiation/quality/duration/timing/severity/associated sxs/prior treatment) HPI Tim [name] is a 57 y.o. male who presents with complaint of cough, pain with cough on the right chest. States hx of COPD, current smoker. Not taking any mediations for this. States "i havent had a chest x-ray in sometimes, and my buddy just got diagnosed with lung cancer, I just want to make sure I am OK." Pt denies fever, chills, URI symptoms, shortness of breath. Nothing makes his symptoms better. Excretion makes his cough worsen. It is productive of "grey" phlegm.   Pt's second complaint today is pain in the right foot. States injured it few months playing basketball. Pain worsening. States pain in the heel radiating into the foot. Worse with walking. Nothing makes it better or worse. Not taking any medications.   Past Medical History  Diagnosis Date  . COPD (chronic obstructive pulmonary disease)   . Hep C w/o coma, chronic   . Mental health disorder   . Scoliosis     Past Surgical History  Procedure Date  . Thyroid surgery   . Skin cancer excision     History reviewed. No pertinent family history.  History  Substance Use Topics  . Smoking status: Current Every Day Smoker -- 1.0 packs/day for 40 years    Types: Cigarettes  . Smokeless tobacco: Not on file  . Alcohol Use: Yes     Comment: 12/daily      Review of Systems  Constitutional: Negative for fever and chills.  HENT: Negative for neck pain and neck stiffness.   Respiratory: Positive for cough, chest tightness and shortness of breath. Negative for wheezing.   Cardiovascular: Positive for chest pain. Negative for palpitations and leg swelling.  Gastrointestinal: Negative.   Musculoskeletal: Positive for myalgias  and arthralgias.  Skin: Negative.   Neurological: Negative for dizziness, weakness, numbness and headaches.    Allergies  Review of patient's allergies indicates no known allergies.  Home Medications   Current Outpatient Rx  Name  Route  Sig  Dispense  Refill  . FLUOXETINE HCL 40 MG PO CAPS   Oral   Take 1 capsule (40 mg total) by mouth daily. For depression and anxiety   30 capsule   0   . HYDROXYZINE PAMOATE 25 MG PO CAPS   Oral   Take 25 mg by mouth 3 (three) times daily as needed. For anxiety         . ADULT MULTIVITAMIN W/MINERALS CH   Oral   Take 1 tablet by mouth daily. Vitamin supplement           BP 147/79  Pulse 74  Temp 97.8 F (36.6 C) (Oral)  Resp 14  SpO2 94%  Physical Exam  Nursing note and vitals reviewed. Constitutional: He is oriented to person, place, and time. He appears well-developed and well-nourished. No distress.  HENT:  Head: Normocephalic.  Eyes: Conjunctivae normal are normal.  Neck: Neck supple.  Cardiovascular: Normal rate, regular rhythm and normal heart sounds.   Pulmonary/Chest: Effort normal. No respiratory distress. He has no wheezes. He has no rales.       Decreased air movement bilaterally  Musculoskeletal: He exhibits no edema.       Normal appearing foot. Tender  to palpation over right heel, right plantar fascia. Pain pain rom of the foot.   Neurological: He is alert and oriented to person, place, and time.  Skin: Skin is warm and dry.  Psychiatric: He has a normal mood and affect. His behavior is normal.    ED Course  Procedures (including critical care time)  Labs Reviewed - No data to display Dg Chest 2 View  11/08/2012  *RADIOLOGY REPORT*  Clinical Data: Cough and right-sided chest pain.  CHEST - 2 VIEW  Comparison: 05/08/2010.  Findings: Trachea is midline.  Surgical clips are seen in the right paratracheal region.  Minimal biapical pleural parenchymal scarring.  Lungs are hyperinflated with scarring in the  lingula. No pleural fluid.  IMPRESSION: COPD without acute finding.   Original Report Authenticated By: Leanna Battles, M.D.    Pt with cough, worried he may have cancer. hz of COPD. Oxygen sat 94% on RA, decreased air movement bilaterally. He is in no disterss. ECG normal.    Date: 11/08/2012  Rate: 74  Rhythm: normal sinus rhythm  QRS Axis: normal  Intervals: normal  ST/T Wave abnormalities: normal  Conduction Disutrbances: none  Narrative Interpretation:   Old EKG Reviewed: No significant changes noted     1. Bronchitis   2. Plantar fasciitis, right       MDM  Pt with cough, right sided chest pain with coughing. He has hx of copd. He is still a smoker. His x-ray normal other than chronic copd changes. He is treated in ED with prednisone and inhaler for his cough. Doubt ACS or PE, no risk factors, normal VS, he is not tachycardic, tachypnec, hypoxic.  His foot x-ray is normal as well. Suspect plantar fascitis. Will treat with NSAIDs.          Lottie Mussel, PA 11/08/12 1606

## 2012-11-08 NOTE — ED Notes (Signed)
Report taken from Wes, RN

## 2012-11-08 NOTE — ED Notes (Signed)
Pt returned from Xray placed back on monitor

## 2012-11-08 NOTE — ED Notes (Signed)
Pt denies chest pain currently; Pt denies nausea/vomiting. Pt denies difficulty breathing. Pt does not appear to be in acute distress. Pt mentating appropriately

## 2012-11-08 NOTE — ED Notes (Signed)
Pt denies chest pain currently. Pt denies difficulty breathing. Pt alert and mentating appropriately. Pt awaiting xray

## 2012-11-09 NOTE — ED Provider Notes (Signed)
Medical screening examination/treatment/procedure(s) were performed by non-physician practitioner and as supervising physician I was immediately available for consultation/collaboration.  Lorana Maffeo K Linker, MD 11/09/12 0805 

## 2012-12-21 DIAGNOSIS — F1410 Cocaine abuse, uncomplicated: Secondary | ICD-10-CM | POA: Insufficient documentation

## 2012-12-21 DIAGNOSIS — J4489 Other specified chronic obstructive pulmonary disease: Secondary | ICD-10-CM | POA: Insufficient documentation

## 2012-12-21 DIAGNOSIS — F1210 Cannabis abuse, uncomplicated: Secondary | ICD-10-CM | POA: Insufficient documentation

## 2012-12-21 DIAGNOSIS — Z79899 Other long term (current) drug therapy: Secondary | ICD-10-CM | POA: Insufficient documentation

## 2012-12-21 DIAGNOSIS — Z8619 Personal history of other infectious and parasitic diseases: Secondary | ICD-10-CM | POA: Insufficient documentation

## 2012-12-21 DIAGNOSIS — F411 Generalized anxiety disorder: Secondary | ICD-10-CM | POA: Insufficient documentation

## 2012-12-21 DIAGNOSIS — F329 Major depressive disorder, single episode, unspecified: Secondary | ICD-10-CM | POA: Insufficient documentation

## 2012-12-21 DIAGNOSIS — F17200 Nicotine dependence, unspecified, uncomplicated: Secondary | ICD-10-CM | POA: Insufficient documentation

## 2012-12-21 DIAGNOSIS — F1010 Alcohol abuse, uncomplicated: Secondary | ICD-10-CM | POA: Insufficient documentation

## 2012-12-21 DIAGNOSIS — F3289 Other specified depressive episodes: Secondary | ICD-10-CM | POA: Insufficient documentation

## 2012-12-22 ENCOUNTER — Encounter (HOSPITAL_COMMUNITY): Payer: Self-pay | Admitting: Emergency Medicine

## 2012-12-22 ENCOUNTER — Emergency Department (HOSPITAL_COMMUNITY)
Admission: EM | Admit: 2012-12-22 | Discharge: 2012-12-22 | Disposition: A | Discharge status: Home / Self Care | Payer: Self-pay | Attending: Emergency Medicine | Admitting: Emergency Medicine

## 2012-12-22 HISTORY — DX: Major depressive disorder, single episode, unspecified: F32.9

## 2012-12-22 HISTORY — DX: Anxiety disorder, unspecified: F41.9

## 2012-12-22 HISTORY — DX: Depression, unspecified: F32.A

## 2012-12-22 LAB — URINALYSIS, ROUTINE W REFLEX MICROSCOPIC
Glucose, UA: NEGATIVE mg/dL
Hgb urine dipstick: NEGATIVE
Leukocytes, UA: NEGATIVE
Specific Gravity, Urine: 1.02 (ref 1.005–1.030)
pH: 6 (ref 5.0–8.0)

## 2012-12-22 LAB — RAPID URINE DRUG SCREEN, HOSP PERFORMED: Opiates: NOT DETECTED

## 2012-12-22 LAB — CBC
MCH: 30.9 pg (ref 26.0–34.0)
MCV: 87.6 fL (ref 78.0–100.0)
Platelets: 220 10*3/uL (ref 150–400)
RBC: 4.69 MIL/uL (ref 4.22–5.81)
RDW: 13.9 % (ref 11.5–15.5)
WBC: 6.8 10*3/uL (ref 4.0–10.5)

## 2012-12-22 LAB — BASIC METABOLIC PANEL
CO2: 22 mEq/L (ref 19–32)
Calcium: 9.4 mg/dL (ref 8.4–10.5)
Creatinine, Ser: 1.15 mg/dL (ref 0.50–1.35)
GFR calc Af Amer: 82 mL/min — ABNORMAL LOW (ref >90)
Sodium: 135 mEq/L (ref 135–145)

## 2012-12-22 LAB — ETHANOL: Alcohol, Ethyl (B): 194 mg/dL — ABNORMAL HIGH (ref 0–11)

## 2012-12-22 MED ORDER — ADULT MULTIVITAMIN W/MINERALS CH: 1.0000 | ORAL_TABLET | Freq: Every day | ORAL | Status: DC

## 2012-12-22 MED ORDER — FLUOXETINE HCL 40 MG PO CAPS: 40.0000 mg | ORAL_CAPSULE | Freq: Every day | ORAL | Status: DC

## 2012-12-22 MED ORDER — ALUM & MAG HYDROXIDE-SIMETH 200-200-20 MG/5ML PO SUSP: 30.0000 mL | ORAL | Status: DC | PRN

## 2012-12-22 MED ORDER — FLUOXETINE HCL 20 MG PO CAPS
40.0000 mg | ORAL_CAPSULE | Freq: Every day | ORAL | Status: DC
  Filled 2012-12-22 (×2): qty 2

## 2012-12-22 MED ORDER — NICOTINE 21 MG/24HR TD PT24: 21.0000 mg | MEDICATED_PATCH | Freq: Every day | TRANSDERMAL | Status: DC

## 2012-12-22 MED ORDER — ACETAMINOPHEN 325 MG PO TABS: 650.0000 mg | ORAL_TABLET | ORAL | Status: DC | PRN

## 2012-12-22 MED ORDER — HYDROXYZINE PAMOATE 25 MG PO CAPS
25.0000 mg | ORAL_CAPSULE | Freq: Three times a day (TID) | ORAL | Status: DC | PRN
  Filled 2012-12-22: qty 1

## 2012-12-22 MED ORDER — HYDROXYZINE PAMOATE 25 MG PO CAPS: 25.0000 mg | ORAL_CAPSULE | Freq: Three times a day (TID) | ORAL | Status: DC | PRN

## 2012-12-22 MED ORDER — IBUPROFEN 400 MG PO TABS: 600.0000 mg | ORAL_TABLET | Freq: Three times a day (TID) | ORAL | Status: DC | PRN

## 2012-12-22 MED ORDER — LORAZEPAM 1 MG PO TABS: 1.0000 mg | ORAL_TABLET | Freq: Three times a day (TID) | ORAL | Status: DC | PRN

## 2012-12-22 MED ORDER — ONDANSETRON HCL 4 MG PO TABS: 4.0000 mg | ORAL_TABLET | Freq: Three times a day (TID) | ORAL | Status: DC | PRN

## 2012-12-22 MED ORDER — NAPROXEN 250 MG PO TABS
375.0000 mg | ORAL_TABLET | Freq: Two times a day (BID) | ORAL | Status: DC
  Administered 2012-12-22: 375 mg via ORAL
  Filled 2012-12-22: qty 2

## 2012-12-22 NOTE — ED Notes (Addendum)
Pt now resting quietly, has not eaten his breakfast tray.

## 2012-12-22 NOTE — ED Provider Notes (Signed)
Pt was seen and evaluated by telepsychtry who believes the pt is safe for discharge and not a threat to himself or others. Outpatient resources for substance abuse given  Please see psychiatry consult note for complete details  Lyanne Co, MD 12/22/12 [phone]

## 2012-12-22 NOTE — ED Provider Notes (Signed)
History     CSN: [id number]  Arrival date & time 12/21/12  2352   First MD Initiated Contact with Patient 12/21/12 2356      Chief Complaint  Patient presents with  . V70.1    (Consider location/radiation/quality/duration/timing/severity/associated sxs/prior treatment) HPI HX per [facility] - brought in for thoughts of hurting himself or others with PSY history.   HX per PT - off of his medications for the last month because his refills ran out and now he is feeling like he may hurt someone if he gets irritated, but denies any specific homicidal ideation.  He is requesting his medications so that he can "mellow out" - he is also requesting to admitted for alcohol detox.  He denies any SI to me. No self injury. He drinks and smokes daily - including today.   Past Medical History  Diagnosis Date  . COPD (chronic obstructive pulmonary disease)   . Hep C w/o coma, chronic   . Mental health disorder   . Scoliosis   . Anxiety   . Depression     Past Surgical History  Procedure Laterality Date  . Thyroid surgery    . Skin cancer excision      No family history on file.  History  Substance Use Topics  . Smoking status: Current Every Day Smoker -- 1.00 packs/day for 40 years    Types: Cigarettes  . Smokeless tobacco: Not on file  . Alcohol Use: Yes     Comment: 12/daily      Review of Systems  Constitutional: Negative for fever and chills.  HENT: Negative for neck pain and neck stiffness.   Eyes: Negative for pain.  Respiratory: Negative for shortness of breath.   Cardiovascular: Negative for chest pain.  Gastrointestinal: Negative for abdominal pain.  Genitourinary: Negative for dysuria.  Musculoskeletal: Negative for back pain.  Skin: Negative for rash.  Neurological: Negative for headaches.  All other systems reviewed and are negative.    Allergies  Review of patient's allergies indicates no known allergies.  Home Medications   Current Outpatient Rx  Name   Route  Sig  Dispense  Refill  . FLUoxetine (PROZAC) 40 MG capsule   Oral   Take 1 capsule (40 mg total) by mouth daily. For depression and anxiety   30 capsule   0   . hydrOXYzine (VISTARIL) 25 MG capsule   Oral   Take 25 mg by mouth 3 (three) times daily as needed. For anxiety         . Multiple Vitamin (MULITIVITAMIN WITH MINERALS) TABS   Oral   Take 1 tablet by mouth daily. Vitamin supplement         . naproxen (NAPROSYN) 375 MG tablet   Oral   Take 1 tablet (375 mg total) by mouth 2 (two) times daily.   20 tablet   0     BP 102/69  Pulse 77  Temp(Src) 98.3 F (36.8 C) (Oral)  Resp 20  Ht 6\' 2"  (1.88 m)  Wt 191 lb (86.637 kg)  BMI 24.51 kg/m2  SpO2 95%  Physical Exam  Constitutional: He is oriented to person, place, and time. He appears well-developed and well-nourished.  HENT:  Head: Normocephalic and atraumatic.  Eyes: EOM are normal. Pupils are equal, round, and reactive to light.  Neck: Neck supple.  Cardiovascular: Normal rate, regular rhythm and intact distal pulses.   Pulmonary/Chest: Effort normal and breath sounds normal. No respiratory distress.  Abdominal: Soft. Bowel sounds  are normal. He exhibits no distension.  Musculoskeletal: Normal range of motion. He exhibits no edema.  Neurological: He is alert and oriented to person, place, and time.  Skin: Skin is warm and dry.  Psychiatric: He has a normal mood and affect.    ED Course  Procedures (including critical care time)  Results for orders placed during the hospital encounter of 12/22/12  CBC      Result Value Range   WBC 6.8  4.0 - 10.5 K/uL   RBC 4.69  4.22 - 5.81 MIL/uL   Hemoglobin 14.5  13.0 - 17.0 g/dL   HCT 21.3  08.6 - 57.8 %   MCV 87.6  78.0 - 100.0 fL   MCH 30.9  26.0 - 34.0 pg   MCHC 35.3  30.0 - 36.0 g/dL   RDW 46.9  62.9 - 52.8 %   Platelets 220  150 - 400 K/uL  BASIC METABOLIC PANEL      Result Value Range   Sodium 135  135 - 145 mEq/L   Potassium 3.7  3.5 - 5.1 mEq/L    Chloride 100  96 - 112 mEq/L   CO2 22  19 - 32 mEq/L   Glucose, Bld 107 (*) 70 - 99 mg/dL   BUN 11  6 - 23 mg/dL   Creatinine, Ser 4.13  0.50 - 1.35 mg/dL   Calcium 9.4  8.4 - 24.4 mg/dL   GFR calc non Af Amer 70 (*) >90 mL/min   GFR calc Af Amer 82 (*) >90 mL/min  ETHANOL      Result Value Range   Alcohol, Ethyl (B) 194 (*) 0 - 11 mg/dL  URINE RAPID DRUG SCREEN (HOSP PERFORMED)      Result Value Range   Opiates NONE DETECTED  NONE DETECTED   Cocaine POSITIVE (*) NONE DETECTED   Benzodiazepines NONE DETECTED  NONE DETECTED   Amphetamines NONE DETECTED  NONE DETECTED   Tetrahydrocannabinol POSITIVE (*) NONE DETECTED   Barbiturates NONE DETECTED  NONE DETECTED  URINALYSIS, ROUTINE W REFLEX MICROSCOPIC      Result Value Range   Color, Urine COLORLESS (*) YELLOW   APPearance CLEAR  CLEAR   Specific Gravity, Urine 1.020  1.005 - 1.030   pH 6.0  5.0 - 8.0   Glucose, UA NEGATIVE  NEGATIVE mg/dL   Hgb urine dipstick NEGATIVE  NEGATIVE   Bilirubin Urine NEGATIVE  NEGATIVE   Ketones, ur NEGATIVE  NEGATIVE mg/dL   Protein, ur NEGATIVE  NEGATIVE mg/dL   Urobilinogen, UA 0.2  0.0 - 1.0 mg/dL   Nitrite NEGATIVE  NEGATIVE   Leukocytes, UA NEGATIVE  NEGATIVE   Home medications ordered - plan [facility] involvement in the a.m. for assessemnt  6:59 AM telepsych pending - PT resting comfortably without complaints MDM   57 yo male with h/o polysubstance abuse, depression and anxiety off of his home medications and requesting detox, brought in for passive SI/ HI.    Labs, UA/ UDS  VS/ nursing notes reviewed       Sunnie Nielsen, MD 12/22/12 0700

## 2012-12-22 NOTE — ED Notes (Addendum)
Pt sleeping, no acute distress

## 2012-12-22 NOTE — ED Notes (Signed)
Pt awake and ready for tele-psychiatry consult, monitor set up at bedside.  Research scientist (physical sciences) has patient in site.  Pt is in no acute distress, no complaints offered.

## 2012-12-22 NOTE — ED Notes (Signed)
Patient presents to ER via [facility] with thoughts of hurting himself and others.  States he wants to hurt a "gangbanger" in his neighborhood.

## 2012-12-22 NOTE — ED Notes (Signed)
Patient has been asleep.

## 2013-01-12 ENCOUNTER — Encounter (HOSPITAL_COMMUNITY): Payer: Self-pay | Admitting: Physical Medicine and Rehabilitation

## 2013-01-12 ENCOUNTER — Emergency Department (HOSPITAL_COMMUNITY)
Admission: EM | Admit: 2013-01-12 | Discharge: 2013-01-12 | Disposition: A | Discharge status: Rehabilitation Facility | Payer: Self-pay | Attending: Emergency Medicine | Admitting: Emergency Medicine

## 2013-01-12 DIAGNOSIS — F3289 Other specified depressive episodes: Secondary | ICD-10-CM | POA: Insufficient documentation

## 2013-01-12 DIAGNOSIS — F10929 Alcohol use, unspecified with intoxication, unspecified: Secondary | ICD-10-CM

## 2013-01-12 DIAGNOSIS — F411 Generalized anxiety disorder: Secondary | ICD-10-CM | POA: Insufficient documentation

## 2013-01-12 DIAGNOSIS — F329 Major depressive disorder, single episode, unspecified: Secondary | ICD-10-CM | POA: Insufficient documentation

## 2013-01-12 DIAGNOSIS — F17200 Nicotine dependence, unspecified, uncomplicated: Secondary | ICD-10-CM | POA: Insufficient documentation

## 2013-01-12 DIAGNOSIS — F1020 Alcohol dependence, uncomplicated: Secondary | ICD-10-CM | POA: Insufficient documentation

## 2013-01-12 DIAGNOSIS — Z8739 Personal history of other diseases of the musculoskeletal system and connective tissue: Secondary | ICD-10-CM | POA: Insufficient documentation

## 2013-01-12 DIAGNOSIS — J4489 Other specified chronic obstructive pulmonary disease: Secondary | ICD-10-CM | POA: Insufficient documentation

## 2013-01-12 DIAGNOSIS — Z8619 Personal history of other infectious and parasitic diseases: Secondary | ICD-10-CM | POA: Insufficient documentation

## 2013-01-12 DIAGNOSIS — J449 Chronic obstructive pulmonary disease, unspecified: Secondary | ICD-10-CM | POA: Insufficient documentation

## 2013-01-12 DIAGNOSIS — Z79899 Other long term (current) drug therapy: Secondary | ICD-10-CM | POA: Insufficient documentation

## 2013-01-12 LAB — CBC
HCT: 39.3 % (ref 39.0–52.0)
Hemoglobin: 13.8 g/dL (ref 13.0–17.0)
MCH: 30.6 pg (ref 26.0–34.0)
MCHC: 35.1 g/dL (ref 30.0–36.0)
RBC: 4.51 MIL/uL (ref 4.22–5.81)
RDW: 13.8 % (ref 11.5–15.5)
WBC: 9.6 10*3/uL (ref 4.0–10.5)

## 2013-01-12 LAB — URINALYSIS, ROUTINE W REFLEX MICROSCOPIC
Leukocytes, UA: NEGATIVE
Nitrite: NEGATIVE
Specific Gravity, Urine: 1.014 (ref 1.005–1.030)
pH: 5 (ref 5.0–8.0)

## 2013-01-12 LAB — COMPREHENSIVE METABOLIC PANEL
BUN: 19 mg/dL (ref 6–23)
Calcium: 9.4 mg/dL (ref 8.4–10.5)
GFR calc Af Amer: 68 mL/min — ABNORMAL LOW (ref >90)
Glucose, Bld: 77 mg/dL (ref 70–99)
Total Protein: 6.7 g/dL (ref 6.0–8.3)

## 2013-01-12 LAB — ETHANOL: Alcohol, Ethyl (B): 138 mg/dL — ABNORMAL HIGH (ref 0–11)

## 2013-01-12 LAB — RAPID URINE DRUG SCREEN, HOSP PERFORMED
Benzodiazepines: NOT DETECTED
Cocaine: POSITIVE — AB
Opiates: NOT DETECTED

## 2013-01-12 MED ORDER — FOLIC ACID 1 MG PO TABS
1.0000 mg | ORAL_TABLET | Freq: Every day | ORAL | Status: DC
  Administered 2013-01-12: 1 mg via ORAL
  Filled 2013-01-12: qty 1

## 2013-01-12 MED ORDER — LORAZEPAM 2 MG/ML IJ SOLN: 1.0000 mg | Freq: Four times a day (QID) | INTRAMUSCULAR | Status: DC | PRN

## 2013-01-12 MED ORDER — LORAZEPAM 1 MG PO TABS: 1.0000 mg | ORAL_TABLET | Freq: Four times a day (QID) | ORAL | Status: DC | PRN

## 2013-01-12 MED ORDER — ONDANSETRON HCL 8 MG PO TABS: 4.0000 mg | ORAL_TABLET | Freq: Three times a day (TID) | ORAL | Status: DC | PRN

## 2013-01-12 MED ORDER — IBUPROFEN 400 MG PO TABS: 600.0000 mg | ORAL_TABLET | Freq: Three times a day (TID) | ORAL | Status: DC | PRN

## 2013-01-12 MED ORDER — THIAMINE HCL 100 MG/ML IJ SOLN: 100.0000 mg | Freq: Every day | INTRAMUSCULAR | Status: DC

## 2013-01-12 MED ORDER — ADULT MULTIVITAMIN W/MINERALS CH
1.0000 | ORAL_TABLET | Freq: Every day | ORAL | Status: DC
  Administered 2013-01-12: 1 via ORAL
  Filled 2013-01-12: qty 1

## 2013-01-12 MED ORDER — NICOTINE 21 MG/24HR TD PT24
21.0000 mg | MEDICATED_PATCH | Freq: Every day | TRANSDERMAL | Status: DC
  Administered 2013-01-12: 21 mg via TRANSDERMAL
  Filled 2013-01-12: qty 1

## 2013-01-12 MED ORDER — VITAMIN B-1 100 MG PO TABS
100.0000 mg | ORAL_TABLET | Freq: Every day | ORAL | Status: DC
  Administered 2013-01-12: 100 mg via ORAL

## 2013-01-12 MED ORDER — ACETAMINOPHEN 325 MG PO TABS: 650.0000 mg | ORAL_TABLET | ORAL | Status: DC | PRN

## 2013-01-12 MED ORDER — VITAMIN B-1 100 MG PO TABS
100.0000 mg | ORAL_TABLET | Freq: Every day | ORAL | Status: DC
Start: 2013-01-12 — End: 2013-01-12
  Filled 2013-01-12: qty 1

## 2013-01-12 NOTE — ED Notes (Signed)
Pt. Comes to ED wanting to be institutionalized for his alcohol, marijuana, and cocaine use.  This is a recurring problem for pt. Pt. Has been institutionalized multiple times in the past.

## 2013-01-12 NOTE — BH Assessment (Signed)
Assessment Note   Tim [name] is an 57 y.o. male that voluntarily presented to [facility] requesting etoh detox. Pt denies SI, HI, AVH, delusions or psychosis. Pt is oriented x'4, alert, calm and cooperative. Pt reports that he also uses crack cocaine and cannabis. Pt reports that his etoh onset was 57 yo and this crack and cannabis onset was 57 yo. Pt denies any other sa use, physical, sexual or emotional abuse. Pt currently lives with his mother and reports "I can't continue to live there because I am constantly exposed to alcohol and other people that are using other illegal stuff". Pt confirms prior hx of inpatient for etoh [facility] 2012 (28 days). Pt said "I completed 28 days and I stayed clean for 9 months after that". Pt reports that he completed detox at [facility] (pt unsure of date). Pt reports that "I do much better in a structured environment and living at home doesn't give me the structure I need". Pt confirms depressive symptoms of hopelessness, fatigue, built, loss of interest in usual pleasure, worthlessness, self-pity and insomnia (2-3/24). Pt said "I can't find no work". Pt denies any criminal charges pending or court dates. Pt recent and remote memory is intact, his concentration is decreased, his impulse control is poor and appetite is decreased (unsure of weight loss). Pt said "I really want to go in long term after detox". Denice Bors, AADC 01/12/2013 2:26 PM  Axis I: Alcohol Abuse Axis II: Deferred Axis III:  Past Medical History  Diagnosis Date  . COPD (chronic obstructive pulmonary disease)   . Hep C w/o coma, chronic   . Mental health disorder   . Scoliosis   . Anxiety   . Depression    Axis IV: economic problems, housing problems, occupational problems, other psychosocial or environmental problems, problems related to social environment, problems with access to health care services and problems with primary support group Axis V: 41-50 serious symptoms  Past Medical  History:  Past Medical History  Diagnosis Date  . COPD (chronic obstructive pulmonary disease)   . Hep C w/o coma, chronic   . Mental health disorder   . Scoliosis   . Anxiety   . Depression     Past Surgical History  Procedure Laterality Date  . Thyroid surgery    . Skin cancer excision      Family History: History reviewed. No pertinent family history.  Social History:  reports that he has been smoking Cigarettes.  He has a 40 pack-year smoking history. He does not have any smokeless tobacco history on file. He reports that  drinks alcohol. He reports that he uses illicit drugs (Marijuana and Cocaine).  Additional Social History:  Alcohol / Drug Use Pain Medications: pt deines Prescriptions: pt denies Over the Counter: pt denies History of alcohol / drug use?: No history of alcohol / drug abuse Longest period of sobriety (when/how long):  (pt reports 12 yrs incarcerated) Negative Consequences of Use: Financial;Legal;Personal relationships;Work / School  CIWA: CIWA-Ar BP: 117/65 mmHg Pulse Rate: 77 Nausea and Vomiting: no nausea and no vomiting Tactile Disturbances: none Tremor: no tremor Auditory Disturbances: not present Paroxysmal Sweats: no sweat visible Visual Disturbances: not present Anxiety: no anxiety, at ease Headache, Fullness in Head: none present Agitation: normal activity Orientation and Clouding of Sensorium: oriented and can do serial additions CIWA-Ar Total: 0 COWS:    Allergies: No Known Allergies  Home Medications:  (Not in a hospital admission)  OB/GYN Status:  No LMP for  male patient.  General Assessment Data Location of Assessment: [facility] ED Living Arrangements: Parent Can pt return to current living arrangement?: Yes Admission Status: Voluntary Is patient capable of signing voluntary admission?: Yes Transfer from: Home Referral Source: Self/Family/Friend  Education Status Is patient currently in school?: No  Risk to self Suicidal  Ideation: No Suicidal Intent: No Is patient at risk for suicide?: No Suicidal Plan?: No Access to Means: No What has been your use of drugs/alcohol within the last 12 months?:  (pt reports hx more than 30 yrs) Previous Attempts/Gestures: No How many times?:  (0) Other Self Harm Risks:  (none noted) Triggers for Past Attempts: None known Intentional Self Injurious Behavior: None Family Suicide History: Unknown Recent stressful life event(s): Loss (Comment);Job Loss;Financial Problems;Conflict (Comment) Persecutory voices/beliefs?: No Depression: Yes Depression Symptoms: Isolating;Fatigue;Guilt;Loss of interest in usual pleasures;Feeling worthless/self pity;Feeling angry/irritable;Insomnia;Tearfulness Substance abuse history and/or treatment for substance abuse?: Yes Suicide prevention information given to non-admitted patients: Not applicable  Risk to Others Homicidal Ideation: No Thoughts of Harm to Others: No Current Homicidal Intent: No Current Homicidal Plan: No Access to Homicidal Means: No Identified Victim:  (none noted) History of harm to others?: No Assessment of Violence: None Noted Violent Behavior Description:  (alert, calm and cooperative) Does patient have access to weapons?: No Criminal Charges Pending?: No Does patient have a court date: No  Psychosis Hallucinations: None noted Delusions: None noted  Mental Status Report Appear/Hygiene: Disheveled;Poor hygiene Eye Contact: Poor Motor Activity: Freedom of movement Speech: Logical/coherent Level of Consciousness: Alert Mood: Depressed Affect: Appropriate to circumstance Anxiety Level: None Thought Processes: Relevant;Coherent Judgement: Unimpaired Orientation: Person;Place;Situation;Time;Appropriate for developmental age Obsessive Compulsive Thoughts/Behaviors: None  Cognitive Functioning Concentration: Decreased Memory: Recent Intact;Remote Intact IQ: Average Insight: Fair Impulse Control:  Poor Appetite: Poor Weight Loss:  (pt unsure) Weight Gain:  (0) Sleep: Decreased Total Hours of Sleep:  (2-3/24) Vegetative Symptoms: None  ADLScreening [facility] Assessment Services) Patient's cognitive ability adequate to safely complete daily activities?: Yes Patient able to express need for assistance with ADLs?: Yes Independently performs ADLs?: Yes (appropriate for developmental age)  Abuse/Neglect [facility]) Physical Abuse: Denies Verbal Abuse: Denies Sexual Abuse: Denies  Prior Inpatient Therapy Prior Inpatient Therapy: Yes Prior Therapy Dates:  (2012,2013) Prior Therapy Facilty/Provider(s):  ([facility]) Reason for Treatment:  (pt reports etoh)  Prior Outpatient Therapy Prior Outpatient Therapy: No  ADL Screening (condition at time of admission) Patient's cognitive ability adequate to safely complete daily activities?: Yes Patient able to express need for assistance with ADLs?: Yes Independently performs ADLs?: Yes (appropriate for developmental age) Weakness of Legs: None Weakness of Arms/Hands: None  Home Assistive Devices/Equipment Home Assistive Devices/Equipment: None  Therapy Consults (therapy consults require a physician order) PT Evaluation Needed: No OT Evalulation Needed: No SLP Evaluation Needed: No Abuse/Neglect Assessment (Assessment to be complete while patient is alone) Physical Abuse: Denies Verbal Abuse: Denies Sexual Abuse: Denies Exploitation of patient/patient's resources: Denies Self-Neglect: Denies Values / Beliefs Cultural Requests During Hospitalization: None Spiritual Requests During Hospitalization: None Consults Spiritual Care Consult Needed: No Social Work Consult Needed: No Merchant navy officer (For Healthcare) Advance Directive: Patient does not have advance directive Pre-existing out of facility DNR order (yellow form or pink MOST form): No Nutrition Screen- [facility] Adult/WL/AP Patient's home diet: Regular Have you recently  lost weight without trying?: No Have you been eating poorly because of a decreased appetite?: Yes Malnutrition Screening Tool Score: 1  Additional Information 1:1 In Past 12 Months?: No CIRT Risk: No Elopement  Risk: No Does patient have medical clearance?: Yes     Disposition:  Disposition Initial Assessment Completed for this Encounter: Yes Disposition of Patient: Inpatient treatment program Type of inpatient treatment program: Adult  On Site Evaluation by:   Reviewed with Physician:     Manual Meier 01/12/2013 1:15 PM

## 2013-01-12 NOTE — ED Provider Notes (Signed)
History     CSN: [id number]  Arrival date & time 01/12/13  4098   First MD Initiated Contact with Patient 01/12/13 [phone]      No chief complaint on file.   (Consider location/radiation/quality/duration/timing/severity/associated sxs/prior treatment) HPI This patient is a middle-aged man with a history of polysubstance abuse, chronic depression, anxiety, COPD and hepatitis C.  He presents to the emergency department requesting inpatient treatment for alcoholism. He is drinking about 12 beers per day. He is intoxicated appearing at this time and admits to drinking all night prior to presentation to the emergency department. He also admits to smoking marijuana over the last several hours. He typically smokes about 4 joints a week. The patient says he also used crack cocaine about 4 hours ago.  The patient denies feeling suicidal or homicidal. He denies any hallucinations. He says that he has been through 2 or 3 substance abuse inpatient treatment programs in the past. But, the longest he has been sober is 2 weeks.   Despite multiple inquiries by me, it does not appear that there was a real inciting event with regards to the patient's presentation this morning. He [place] the [place]".  He says he was in town today doing "some day work". He figured since he was in town that he would get some help.   Patient says he wants to be insititutionalized because he feels more comfortable with structure. Says  He never felt better than when he was in prison.   Past Medical History  Diagnosis Date  . COPD (chronic obstructive pulmonary disease)   . Hep C w/o coma, chronic   . Mental health disorder   . Scoliosis   . Anxiety   . Depression     Past Surgical History  Procedure Laterality Date  . Thyroid surgery    . Skin cancer excision      No family history on file.  History  Substance Use Topics  . Smoking status: Current Every Day Smoker -- 1.00 packs/day  for 40 years    Types: Cigarettes  . Smokeless tobacco: Not on file  . Alcohol Use: Yes     Comment: 12/daily      Review of Systems Gen: no weight loss, fevers, chills, night sweats Eyes: no discharge or drainage, no occular pain or visual changes Nose: no epistaxis or rhinorrhea Mouth: no dental pain, no sore throat Neck: no neck pain Lungs: no SOB, chronic and mildly productive cough, wheezing CV: no chest pain, palpitations, dependent edema or orthopnea Abd: no abdominal pain, nausea, vomiting GU: no dysuria or gross hematuria MSK: no myalgias or arthralgias Neuro: no headache, no focal neurologic deficits Skin: no rash Psyche: as above  Allergies  Review of patient's allergies indicates no known allergies.  Home Medications   Current Outpatient Rx  Name  Route  Sig  Dispense  Refill  . FLUoxetine (PROZAC) 40 MG capsule   Oral   Take 1 capsule (40 mg total) by mouth daily.   30 capsule   0   . hydrOXYzine (VISTARIL) 25 MG capsule   Oral   Take 25 mg by mouth 3 (three) times daily as needed. For anxiety           BP 110/73  Pulse 78  Temp(Src) 99 F (37.2 C) (Oral)  Resp 16  SpO2 95%  Physical Exam Gen: Appears intoxicated, poorly groomed, disheveled, closer to 30. Head: NCAT Eyes: PERL, EOMI Nose: no  epistaixis or rhinorrhea Mouth/throat: mucosa is moist and pink, diffuse dental caries, several missing teeth, oral hygiene is poor. Neck: supple, no stridor Lungs: CTA B, no wheezing, rhonchi or rales Abd: soft, notender, nondistended Back: no ttp, no cva ttp Skin: no rashese, wnl, ruddy complexion Neuro: CN ii-xii grossly intact, no focal deficits, speech is difficult to understand and it is unclear if this is a result of multiple missing teeth, alcohol intoxication or a combination.  Psyche; mildly agitated affect, cooperative.   ED Course  Procedures (including critical care time)  Patient with request for inpatient tx of alcoholism. Appears  very intoxicated at this time. We are treating with IM thiamine and will allow patient to sober up before he is evaluated by mental  Health. If the patient is clinically sober and decides to leave, he should not be held as involuntarily committed, based on my interview with him.         MDM  See above.          Brandt Loosen, MD 01/12/13 [phone]

## 2013-01-12 NOTE — BHH Counselor (Signed)
Pt referral faxed to [facility] and Everlean Alstrom said they have beds. Per Everlean Alstrom if pt is accepted they will be able to pick-up pt before 4pm. Denice Bors, AADC 01/12/2013 2:46 PM

## 2013-01-12 NOTE — ED Provider Notes (Addendum)
  Physical Exam  BP 109/49  Pulse 76  Temp(Src) 99 F (37.2 C) (Oral)  Resp 18  SpO2 95%  Physical Exam  ED Course  Procedures  MDM Patient accepted at change of shift, here for alcohol abuse. He states that he cannot continue to live in his home situation because it predisposes him to the exposure of alcohol use and other people who are using substances he legally. He admits to using marijuana, cocaine and alcohol overnight, on my exam at this time he does not appear to be in distress, he does not appear to be in withdrawal and has normal vital signs. His abdomen is soft, lungs are clear, heart is regular without tachycardia and he has no tremor. The patient has been incarcerated in the past, he feels that he needs to be in a structured environment and living at home does not offer him the structure.  Consult to ACT pending.  Labs unremarkable.  ED ECG REPORT  I personally interpreted this EKG   Date: 01/12/2013   Rate: 67  Rhythm: normal sinus rhythm  QRS Axis: normal  Intervals: normal  ST/T Wave abnormalities: normal  Conduction Disutrbances:none  Narrative Interpretation:   Old EKG Reviewed: Compared with 11/08/2012, no significant changes are seen     Vida Roller, MD 01/12/13 4098  Vida Roller, MD 01/12/13 1009

## 2013-01-12 NOTE — ED Notes (Signed)
Pt changed into blue paper scrubs. Security at bedside to wand. Pt remains calm and cooperative. Vital signs stable. Breakfast tray at bedside.

## 2013-01-12 NOTE — ED Notes (Signed)
Pt resting quietly at the time. Vital signs stable. Denies pain. He is calm and cooperative. No signs of acute distress noted.

## 2013-01-12 NOTE — BHH Counselor (Signed)
Pt completed telephone interview with Everlean Alstrom at [facility], pt is accepted and will be picked up approximately 2100. Denice Bors, AADC 01/12/2013 4:33 PM

## 2013-03-29 ENCOUNTER — Emergency Department (HOSPITAL_COMMUNITY)
Admission: EM | Admit: 2013-03-29 | Discharge: 2013-03-29 | Disposition: A | Discharge status: Other Acute Inpatient Hospital | Payer: Federal, State, Local not specified - Other | Attending: Emergency Medicine | Admitting: Emergency Medicine

## 2013-03-29 ENCOUNTER — Encounter (HOSPITAL_COMMUNITY): Payer: Self-pay

## 2013-03-29 ENCOUNTER — Inpatient Hospital Stay (HOSPITAL_COMMUNITY)
Admission: AD | Admit: 2013-03-29 | Discharge: 2013-04-02 | DRG: 897 | Disposition: A | Discharge status: Home / Self Care | Payer: Federal, State, Local not specified - Other | Source: Intra-hospital | Attending: Psychiatry | Admitting: Psychiatry

## 2013-03-29 DIAGNOSIS — F1020 Alcohol dependence, uncomplicated: Principal | ICD-10-CM | POA: Diagnosis present

## 2013-03-29 DIAGNOSIS — B182 Chronic viral hepatitis C: Secondary | ICD-10-CM | POA: Diagnosis present

## 2013-03-29 DIAGNOSIS — F1410 Cocaine abuse, uncomplicated: Secondary | ICD-10-CM | POA: Insufficient documentation

## 2013-03-29 DIAGNOSIS — IMO0002 Reserved for concepts with insufficient information to code with codable children: Secondary | ICD-10-CM

## 2013-03-29 DIAGNOSIS — F10229 Alcohol dependence with intoxication, unspecified: Secondary | ICD-10-CM | POA: Insufficient documentation

## 2013-03-29 DIAGNOSIS — F17200 Nicotine dependence, unspecified, uncomplicated: Secondary | ICD-10-CM | POA: Insufficient documentation

## 2013-03-29 DIAGNOSIS — Z8739 Personal history of other diseases of the musculoskeletal system and connective tissue: Secondary | ICD-10-CM | POA: Insufficient documentation

## 2013-03-29 DIAGNOSIS — J449 Chronic obstructive pulmonary disease, unspecified: Secondary | ICD-10-CM | POA: Diagnosis present

## 2013-03-29 DIAGNOSIS — J4489 Other specified chronic obstructive pulmonary disease: Secondary | ICD-10-CM | POA: Diagnosis present

## 2013-03-29 DIAGNOSIS — F1210 Cannabis abuse, uncomplicated: Secondary | ICD-10-CM | POA: Diagnosis present

## 2013-03-29 DIAGNOSIS — F329 Major depressive disorder, single episode, unspecified: Secondary | ICD-10-CM

## 2013-03-29 DIAGNOSIS — F411 Generalized anxiety disorder: Secondary | ICD-10-CM | POA: Insufficient documentation

## 2013-03-29 DIAGNOSIS — Z5900 Homelessness unspecified: Secondary | ICD-10-CM | POA: Insufficient documentation

## 2013-03-29 DIAGNOSIS — Z8619 Personal history of other infectious and parasitic diseases: Secondary | ICD-10-CM | POA: Insufficient documentation

## 2013-03-29 DIAGNOSIS — F332 Major depressive disorder, recurrent severe without psychotic features: Secondary | ICD-10-CM

## 2013-03-29 DIAGNOSIS — Z79899 Other long term (current) drug therapy: Secondary | ICD-10-CM

## 2013-03-29 DIAGNOSIS — F3289 Other specified depressive episodes: Secondary | ICD-10-CM

## 2013-03-29 DIAGNOSIS — F1910 Other psychoactive substance abuse, uncomplicated: Secondary | ICD-10-CM

## 2013-03-29 DIAGNOSIS — F489 Nonpsychotic mental disorder, unspecified: Secondary | ICD-10-CM | POA: Insufficient documentation

## 2013-03-29 LAB — COMPREHENSIVE METABOLIC PANEL
AST: 25 U/L (ref 0–37)
CO2: 22 mEq/L (ref 19–32)
Calcium: 9.9 mg/dL (ref 8.4–10.5)
Creatinine, Ser: 1.44 mg/dL — ABNORMAL HIGH (ref 0.50–1.35)
GFR calc non Af Amer: 53 mL/min — ABNORMAL LOW (ref >90)
Sodium: 135 mEq/L (ref 135–145)
Total Protein: 7.3 g/dL (ref 6.0–8.3)

## 2013-03-29 LAB — CBC
MCH: 31.1 pg (ref 26.0–34.0)
MCHC: 35.2 g/dL (ref 30.0–36.0)
MCV: 88.4 fL (ref 78.0–100.0)
Platelets: 237 10*3/uL (ref 150–400)
RBC: 4.92 MIL/uL (ref 4.22–5.81)
RDW: 14.2 % (ref 11.5–15.5)

## 2013-03-29 LAB — RAPID URINE DRUG SCREEN, HOSP PERFORMED
Benzodiazepines: NOT DETECTED
Cocaine: POSITIVE — AB
Opiates: NOT DETECTED
Tetrahydrocannabinol: POSITIVE — AB

## 2013-03-29 NOTE — ED Notes (Signed)
Pt has been sleeping much of this shift. Denies SI and HI.

## 2013-03-29 NOTE — ED Notes (Signed)
Pt changed in blue scrubs

## 2013-03-29 NOTE — ED Notes (Signed)
Pt states that he took 10 vistarils and 2 prozacs about 1 hour ago, not intending on hurting himself, he states that he wants to get away from this lifestyle

## 2013-03-29 NOTE — ED Notes (Signed)
Pt transferred from triage, presents for detox from [facility], cocaine.  Drinks 12 pack beer/day. Last used cocaine yesterday.  Denies SI or HI, no AV hallucinations.  Diag with Depression in past.  Feeling hopeless.

## 2013-03-29 NOTE — ED Provider Notes (Signed)
History     CSN: [id number]  Arrival date & time 03/29/13  0111   First MD Initiated Contact with Patient 03/29/13 0405      Chief Complaint  Patient presents with  . Medical Clearance    (Consider location/radiation/quality/duration/timing/severity/associated sxs/prior treatment) HPI HX per PT - abuses etoh, cocaine and marijuana and is requesting a 90 day detox.  He is homeless. He denies any SI/HI. No Sz history. Last ingestion today.   Past Medical History  Diagnosis Date  . COPD (chronic obstructive pulmonary disease)   . Hep C w/o coma, chronic   . Mental health disorder   . Scoliosis   . Anxiety   . Depression     Past Surgical History  Procedure Laterality Date  . Thyroid surgery    . Skin cancer excision      History reviewed. No pertinent family history.  History  Substance Use Topics  . Smoking status: Current Every Day Smoker -- 1.00 packs/day for 40 years    Types: Cigarettes  . Smokeless tobacco: Not on file  . Alcohol Use: Yes     Comment: 12/daily      Review of Systems  Constitutional: Negative for fever and chills.  HENT: Negative for neck pain and neck stiffness.   Eyes: Negative for pain.  Respiratory: Negative for shortness of breath.   Cardiovascular: Negative for chest pain.  Gastrointestinal: Negative for vomiting and abdominal pain.  Genitourinary: Negative for flank pain.  Musculoskeletal: Negative for back pain.  Skin: Negative for rash.  Neurological: Negative for headaches.  All other systems reviewed and are negative.    Allergies  Review of patient's allergies indicates no known allergies.  Home Medications   Current Outpatient Rx  Name  Route  Sig  Dispense  Refill  . FLUoxetine (PROZAC) 40 MG capsule   Oral   Take 1 capsule (40 mg total) by mouth daily.   30 capsule   0   . hydrOXYzine (VISTARIL) 25 MG capsule   Oral   Take 25 mg by mouth 3 (three) times daily as needed. For anxiety           Pulse 81   Temp(Src) 98.4 F (36.9 C) (Oral)  Resp 20  Wt 180 lb (81.647 kg)  BMI 23.1 kg/m2  SpO2 94%  Physical Exam  Constitutional: He is oriented to person, place, and time. He appears well-developed and well-nourished.  dishelved  HENT:  Head: Normocephalic and atraumatic.  Eyes: EOM are normal. Pupils are equal, round, and reactive to light.  Neck: Neck supple.  Cardiovascular: Regular rhythm and intact distal pulses.   Pulmonary/Chest: Effort normal. No respiratory distress.  Musculoskeletal: Normal range of motion. He exhibits no edema.  Neurological: He is alert and oriented to person, place, and time.  Skin: Skin is warm and dry.  Psychiatric: He has a normal mood and affect.    ED Course  Procedures (including critical care time)  Labs Reviewed  COMPREHENSIVE METABOLIC PANEL - Abnormal; Notable for the following:    Creatinine, Ser 1.44 (*)    GFR calc non Af Amer 53 (*)    GFR calc Af Amer 62 (*)    All other components within normal limits  ETHANOL - Abnormal; Notable for the following:    Alcohol, Ethyl (B) 79 (*)    All other components within normal limits  URINE RAPID DRUG SCREEN (HOSP PERFORMED) - Abnormal; Notable for the following:    Cocaine POSITIVE (*)  Tetrahydrocannabinol POSITIVE (*)    All other components within normal limits  CBC     4:45 AM d/w ACT - will evaluate now for detox. [facility] is sending info for possible placement. If unable to place, is stable for discharge. Will monitor for any symptoms of withdrawal. No anticholinergic symptoms after taking 10 vistaril tabs PTA today  Filed Vitals:   03/29/13 0526  BP: 106/62  Pulse: 72  Temp: 97.9 F (36.6 C)  Resp: 18   MDM  Polysubstance abuse requesting detox h/o same  Labs reviewed  ACT involved  Prior records reviewed - multiple evaluations for same. Most recently admitted to [facility] March 2014     Sunnie Nielsen, MD 03/29/13 [phone]

## 2013-03-29 NOTE — ED Notes (Signed)
Pt requesting detox from etoh, cocaine, and marijuana

## 2013-03-29 NOTE — Progress Notes (Signed)
Received call from [place] at [facility] patient has been accepted. Authorization [phone] for 3 days. Patient calling brother for possible transportation to facility.

## 2013-03-29 NOTE — ED Provider Notes (Signed)
Patient now accepted at [facility].  Tim [name]. Rubin Payor, MD 03/29/13 2207

## 2013-03-29 NOTE — ED Notes (Signed)
Pt aaox3.  Pt denies SI/HI/HI/AH at this time.  Pt calm and cooperative.  Pt reports having a good day today.  Plan of care discussed with patient

## 2013-03-29 NOTE — BH Assessment (Signed)
BHH Assessment Progress Note      Reviewed this referral for admission, due to client being self pay he first has to be denied at [facility] and [facility] before he can be admitted here. Waiting to get clarification re this before going forward with an admission here, Currently there are no detox beds but several discharges are expected.

## 2013-03-29 NOTE — Consult Note (Signed)
Reason for Consult:Polysubstance dependence Referring Physician: Dr Barbaraann Share is an 57 y.o. male.  HPI: Patient is a 57 yr old white male that requests detox from alcohol.   This is one the multiple visits for this patient seeking detox treatment and depression. He was admitted to our inpatient March this year..Patient reports that he drinks a 12 pack of beer daily. Patient reports that he has been drinking heavily since his early 20's. Patient reports that his last drink was yesterday at 4pm. Patients BAL is 79. Patients UDS is positive foTetrahydrocannabinol and cocaine. Patient is in bed this am and he stated that he want to stop using alcohol and cocaine..  Patient reports a prior history of psychiatric hospitalizations in 2012, 2013 at [place], [facility]. Patient reports a previous mental health diagnosis of depression and anxiety. Patient denies any outpatient therapy. Patient reports that he is compliant with medication management. Patient reports that is currently taking Prozac 2 (20mg  daily).  Patient denies SI/HI. Patient denies psychosis. He is calm and cooperative and is waiting to be admitted to our inpatient unit for chemical dependence.   Past Medical History  Diagnosis Date  . COPD (chronic obstructive pulmonary disease)   . Hep C w/o coma, chronic   . Mental health disorder   . Scoliosis   . Anxiety   . Depression     Past Surgical History  Procedure Laterality Date  . Thyroid surgery    . Skin cancer excision      History reviewed. No pertinent family history.  Social History:  reports that he has been smoking Cigarettes.  He has a 40 pack-year smoking history. He does not have any smokeless tobacco history on file. He reports that  drinks alcohol. He reports that he uses illicit drugs (Marijuana and Cocaine).  Allergies: No Known Allergies  Medications: I have reviewed the patient's current medications.  Results for orders placed during the hospital  encounter of 03/29/13 (from the past 48 hour(s))  URINE RAPID DRUG SCREEN (HOSP PERFORMED)     Status: Abnormal   Collection Time    03/29/13  1:37 AM      Result Value Range   Opiates NONE DETECTED  NONE DETECTED   Cocaine POSITIVE (*) NONE DETECTED   Benzodiazepines NONE DETECTED  NONE DETECTED   Amphetamines NONE DETECTED  NONE DETECTED   Tetrahydrocannabinol POSITIVE (*) NONE DETECTED   Barbiturates NONE DETECTED  NONE DETECTED   Comment:            DRUG SCREEN FOR MEDICAL PURPOSES     ONLY.  IF CONFIRMATION IS NEEDED     FOR ANY PURPOSE, NOTIFY LAB     WITHIN 5 DAYS.                LOWEST DETECTABLE LIMITS     FOR URINE DRUG SCREEN     Drug Class       Cutoff (ng/mL)     Amphetamine      1000     Barbiturate      200     Benzodiazepine   200     Tricyclics       300     Opiates          300     Cocaine          300     THC              50  CBC     Status: None   Collection Time    03/29/13  1:50 AM      Result Value Range   WBC 7.4  4.0 - 10.5 K/uL   RBC 4.92  4.22 - 5.81 MIL/uL   Hemoglobin 15.3  13.0 - 17.0 g/dL   HCT 16.1  09.6 - 04.5 %   MCV 88.4  78.0 - 100.0 fL   MCH 31.1  26.0 - 34.0 pg   MCHC 35.2  30.0 - 36.0 g/dL   RDW 40.9  81.1 - 91.4 %   Platelets 237  150 - 400 K/uL  COMPREHENSIVE METABOLIC PANEL     Status: Abnormal   Collection Time    03/29/13  1:50 AM      Result Value Range   Sodium 135  135 - 145 mEq/L   Potassium 3.7  3.5 - 5.1 mEq/L   Chloride 100  96 - 112 mEq/L   CO2 22  19 - 32 mEq/L   Glucose, Bld 91  70 - 99 mg/dL   BUN 18  6 - 23 mg/dL   Creatinine, Ser 7.82 (*) 0.50 - 1.35 mg/dL   Calcium 9.9  8.4 - 95.6 mg/dL   Total Protein 7.3  6.0 - 8.3 g/dL   Albumin 4.0  3.5 - 5.2 g/dL   AST 25  0 - 37 U/L   ALT 15  0 - 53 U/L   Alkaline Phosphatase 94  39 - 117 U/L   Total Bilirubin 0.5  0.3 - 1.2 mg/dL   GFR calc non Af Amer 53 (*) >90 mL/min   GFR calc Af Amer 62 (*) >90 mL/min   Comment:            The eGFR has been calculated      using the CKD EPI equation.     This calculation has not been     validated in all clinical     situations.     eGFR's persistently     <90 mL/min signify     possible Chronic Kidney Disease.  ETHANOL     Status: Abnormal   Collection Time    03/29/13  1:50 AM      Result Value Range   Alcohol, Ethyl (B) 79 (*) 0 - 11 mg/dL   Comment:            LOWEST DETECTABLE LIMIT FOR     SERUM ALCOHOL IS 11 mg/dL     FOR MEDICAL PURPOSES ONLY    No results found.  Review of Systems  Constitutional: Negative.   Eyes: Negative.   Cardiovascular: Negative.   Gastrointestinal: Negative.   Genitourinary: Negative.   Musculoskeletal: Negative.   Skin: Negative.   Neurological: Negative.   Endo/Heme/Allergies: Negative.   Psychiatric/Behavioral: Positive for depression (depressed 7/10) and substance abuse (struggles with alcohol, cocain,marijuana,). Negative for suicidal ideas, hallucinations and memory loss. The patient is not nervous/anxious and does not have insomnia.    Blood pressure 116/74, pulse 61, temperature 97.9 F (36.6 C), temperature source Oral, resp. rate 18, weight 81.647 kg (180 lb), SpO2 94.00%. Physical Exam  Constitutional: He is oriented to person, place, and time. He appears well-developed and well-nourished.  HENT:  Head: Normocephalic and atraumatic.  Eyes: EOM are normal.  Neck: Normal range of motion. Neck supple. No JVD present. No tracheal deviation present. No thyromegaly present.  Cardiovascular: Normal rate, regular rhythm, normal heart sounds and intact distal pulses.   Respiratory:  Breath sounds normal. No respiratory distress. He has no wheezes. He has no rales. He exhibits no tenderness.  GI: Soft. Bowel sounds are normal. He exhibits no distension and no mass. There is no tenderness. There is no rebound and no guarding.  Musculoskeletal: Normal range of motion. He exhibits no edema.  Lymphadenopathy:    He has no cervical adenopathy.  Neurological:  He is alert and oriented to person, place, and time. He displays abnormal reflex. No cranial nerve deficit. He exhibits abnormal muscle tone. Coordination normal.  Skin: Skin is warm and dry. No rash noted. No erythema. No pallor.    Assessment/Plan:  Conlted with Dr Lolly Mustache and face to face interview completed. Recommendation/Plan: We  Will admit to our Chemical dependency unit when bed is available.  Dahlia Byes, C  PMHNP-BC 03/29/2013, 5:00 PM     I personally seen the patient agreed with the findings and involved in the treatment plan.

## 2013-03-29 NOTE — Progress Notes (Signed)
[facility] has seen patient and provided him with a oc application. °

## 2013-03-29 NOTE — BH Assessment (Addendum)
Assessment Note    Patient is a 57 yr old white male that requests detox from alcohol. Patient reports that he drinks a 12 pack of beer daily.  Patient reports that he took 10 vistarils and 2 prozacs about 1 hour ago.  Patient reports that he has been drinking heavily since his early 20's.  Patient reports that his last drink was yesterday at 4pm.  Patients BAL is 79.   Patients UDS is positive foTetrahydrocannabinol and cocaine.  Patient reports the following withdrawal symptoms chills, weakness, slight nausea. Patients CIWA score is 11.  Patient reports a prior history of psychiatric hospitalizations in 2012, 2013 at [place], [facility].  Patient reports a previous mental health diagnosis of depression and anxiety.  Patient denies any outpatient therapy.  Patient reports that he is compliant with medication management.  Patient reports that is currently taking Prozac 2 (20mg  daily).   Patient reports previous substance abuse treatment and detox in  2014, 2013 and 2012 at [facility] and [facility].  Patient denies SI/HI.  Patient denies psychosis.       Axis I: Alcohol Dependence, Anxiety Disorder, Depressive Disorder  Axis II: Deferred Axis III:  Past Medical History  Diagnosis Date  . COPD (chronic obstructive pulmonary disease)   . Hep C w/o coma, chronic   . Mental health disorder   . Scoliosis   . Anxiety   . Depression    Axis IV: economic problems, occupational problems, other psychosocial or environmental problems, problems related to social environment and problems with access to health care services Axis V: 31-40 impairment in reality testing  Past Medical History:  Past Medical History  Diagnosis Date  . COPD (chronic obstructive pulmonary disease)   . Hep C w/o coma, chronic   . Mental health disorder   . Scoliosis   . Anxiety   . Depression     Past Surgical History  Procedure Laterality Date  . Thyroid surgery    . Skin cancer excision      Family History: History  reviewed. No pertinent family history.  Social History:  reports that he has been smoking Cigarettes.  He has a 40 pack-year smoking history. He does not have any smokeless tobacco history on file. He reports that  drinks alcohol. He reports that he uses illicit drugs (Marijuana and Cocaine).  Additional Social History:     CIWA: CIWA-Ar Pulse Rate: 81 COWS:    Allergies: No Known Allergies  Home Medications:  (Not in a hospital admission)  OB/GYN Status:  No LMP for male patient.  General Assessment Data Location of Assessment: [facility] ACT Assessment: Yes Living Arrangements: Other relatives Can pt return to current living arrangement?: Yes Admission Status: Voluntary Is patient capable of signing voluntary admission?: Yes Transfer from: Acute Hospital Referral Source: Self/Family/Friend  Education Status Is patient currently in school?: No  Risk to self Suicidal Ideation: No Suicidal Intent: No Is patient at risk for suicide?: No Suicidal Plan?: No-Not Currently/Within Last 6 Months Access to Means: No What has been your use of drugs/alcohol within the last 12 months?: alcohol Previous Attempts/Gestures: No How many times?: 0 Other Self Harm Risks: None Triggers for Past Attempts: None known Intentional Self Injurious Behavior: None Family Suicide History: No Recent stressful life event(s): Job Loss;Financial Problems Persecutory voices/beliefs?: No Depression: Yes Depression Symptoms: Isolating;Loss of interest in usual pleasures;Feeling worthless/self pity Substance abuse history and/or treatment for substance abuse?: Yes Suicide prevention information given to non-admitted patients: Not applicable  Risk  to Others Homicidal Ideation: No Thoughts of Harm to Others: No Current Homicidal Intent: No Current Homicidal Plan: No Access to Homicidal Means: No Identified Victim: None  History of harm to others?: No Assessment of Violence: None Noted Violent  Behavior Description: calm Does patient have access to weapons?: No Criminal Charges Pending?: No Does patient have a court date: No  Psychosis Hallucinations: None noted Delusions: None noted  Mental Status Report Appear/Hygiene: Body odor;Disheveled;Layered clothes Eye Contact: Fair Motor Activity: Freedom of movement Speech: Logical/coherent Level of Consciousness: Alert Mood: Anxious Affect: Depressed Anxiety Level: Minimal Thought Processes: Coherent;Relevant Judgement: Unimpaired Orientation: Person;Place;Time;Situation Obsessive Compulsive Thoughts/Behaviors: None  Cognitive Functioning Concentration: Decreased Memory: Recent Intact;Remote Intact IQ: Average Insight: Poor Impulse Control: Poor Appetite: Fair Weight Loss: 0 Weight Gain: 0 Sleep: Decreased Total Hours of Sleep: 4 Vegetative Symptoms: Not bathing  ADLScreening [facility]) Patient's cognitive ability adequate to safely complete daily activities?: Yes Patient able to express need for assistance with ADLs?: Yes Independently performs ADLs?: Yes (appropriate for developmental age)  Abuse/Neglect [facility]) Physical Abuse: Denies Verbal Abuse: Denies Sexual Abuse: Denies  Prior Inpatient Therapy Prior Inpatient Therapy: Yes Prior Therapy Dates: 2011, 2013  Prior Therapy Facilty/Provider([facility] Reason for Treatment: substance abuse   Prior Outpatient Therapy Prior Outpatient Therapy: Yes Prior Therapy Dates: ongoing current  Prior Therapy Facilty/Provider(s): [facility]  Reason for Treatment: Medicatio Mgt   ADL Screening (condition at time of admission) Patient's cognitive ability adequate to safely complete daily activities?: Yes Patient able to express need for assistance with ADLs?: Yes Independently performs ADLs?: Yes (appropriate for developmental age)       Abuse/Neglect Assessment (Assessment to be complete while patient is  alone) Physical Abuse: Denies Verbal Abuse: Denies Sexual Abuse: Denies Values / Beliefs Cultural Requests During Hospitalization: None Spiritual Requests During Hospitalization: None        Additional Information 1:1 In Past 12 Months?: No CIRT Risk: No Elopement Risk: No Does patient have medical clearance?: Yes     Disposition: Pending [facility].  Disposition Initial Assessment Completed for this Encounter: Yes Disposition of Patient: Referred to Patient referred to: Other (Comment)  On Site Evaluation by:   Reviewed with Physician:     Phillip Heal LaVerne 03/29/2013 4:45 AM

## 2013-03-29 NOTE — Progress Notes (Signed)
Pt referred to [facility], however declined due to recent admissions.  Pt referred to [facility], pending review.   Catha Gosselin, LCSWA  [phone] 03/29/2013 1704pm

## 2013-03-29 NOTE — ED Provider Notes (Deleted)
Accepted at [facility], dr Domingo Mend R. Rubin Payor, MD 03/29/13 1943

## 2013-03-29 NOTE — ED Notes (Signed)
ACT completed assessment and looking for placement for patient. Pt resting in room

## 2013-03-29 NOTE — BHH Counselor (Signed)
ACT Team contacted [facility] and they do have a open bed for detox. ACT Team has faxed referral information to the patient.

## 2013-03-29 NOTE — Progress Notes (Signed)
Pt referred to [facility], pending review. [facility] to follow up with [facility]. [employer] authorization will need to be obtained from [facility]. Patient states he has a valid id if needed for train but hopeful that patient brother in [place] will be able to provide transportation.   Catha Gosselin, LCSWA  [phone] .03/29/2013 1739pm

## 2013-03-29 NOTE — BH Assessment (Signed)
BHH Assessment Progress Note      Per charge nurse Brook McNichol  At [facility] pt can be transferred to [facility] at this time. Ardelia Mems, RN ACT Team Counselor notified of this.  Pt is assigned to [id number].   Glorious Peach, MS, LCASA Assessment Counselor

## 2013-03-30 ENCOUNTER — Encounter (HOSPITAL_COMMUNITY): Payer: Self-pay

## 2013-03-30 DIAGNOSIS — F1410 Cocaine abuse, uncomplicated: Secondary | ICD-10-CM

## 2013-03-30 DIAGNOSIS — F1210 Cannabis abuse, uncomplicated: Secondary | ICD-10-CM

## 2013-03-30 DIAGNOSIS — F411 Generalized anxiety disorder: Secondary | ICD-10-CM

## 2013-03-30 DIAGNOSIS — F332 Major depressive disorder, recurrent severe without psychotic features: Secondary | ICD-10-CM

## 2013-03-30 DIAGNOSIS — F1020 Alcohol dependence, uncomplicated: Principal | ICD-10-CM

## 2013-03-30 DIAGNOSIS — F1994 Other psychoactive substance use, unspecified with psychoactive substance-induced mood disorder: Secondary | ICD-10-CM

## 2013-03-30 MED ORDER — CHLORDIAZEPOXIDE HCL 25 MG PO CAPS
25.0000 mg | ORAL_CAPSULE | ORAL | Status: AC
  Administered 2013-04-01 – 2013-04-02 (×2): 25 mg via ORAL
  Filled 2013-03-30 (×2): qty 1

## 2013-03-30 MED ORDER — TRAZODONE HCL 50 MG PO TABS
50.0000 mg | ORAL_TABLET | Freq: Every evening | ORAL | Status: DC | PRN
  Administered 2013-03-30 – 2013-04-01 (×4): 50 mg via ORAL
  Filled 2013-03-30: qty 28
  Filled 2013-03-30 (×4): qty 1

## 2013-03-30 MED ORDER — ACETAMINOPHEN 325 MG PO TABS
650.0000 mg | ORAL_TABLET | Freq: Four times a day (QID) | ORAL | Status: DC | PRN
  Administered 2013-03-30: 650 mg via ORAL

## 2013-03-30 MED ORDER — CHLORDIAZEPOXIDE HCL 25 MG PO CAPS
25.0000 mg | ORAL_CAPSULE | Freq: Four times a day (QID) | ORAL | Status: AC
  Administered 2013-03-30 – 2013-03-31 (×6): 25 mg via ORAL
  Filled 2013-03-30 (×6): qty 1

## 2013-03-30 MED ORDER — VITAMIN B-1 100 MG PO TABS
100.0000 mg | ORAL_TABLET | Freq: Every day | ORAL | Status: DC
  Administered 2013-03-31 – 2013-04-02 (×3): 100 mg via ORAL
  Filled 2013-03-30 (×5): qty 1

## 2013-03-30 MED ORDER — HYDROXYZINE HCL 25 MG PO TABS
25.0000 mg | ORAL_TABLET | Freq: Four times a day (QID) | ORAL | Status: AC | PRN
  Administered 2013-04-01: 25 mg via ORAL

## 2013-03-30 MED ORDER — ONDANSETRON 4 MG PO TBDP: 4.0000 mg | ORAL_TABLET | Freq: Four times a day (QID) | ORAL | Status: AC | PRN

## 2013-03-30 MED ORDER — ALUM & MAG HYDROXIDE-SIMETH 200-200-20 MG/5ML PO SUSP: 30.0000 mL | ORAL | Status: DC | PRN

## 2013-03-30 MED ORDER — LOPERAMIDE HCL 2 MG PO CAPS: 2.0000 mg | ORAL_CAPSULE | ORAL | Status: AC | PRN

## 2013-03-30 MED ORDER — FLUOXETINE HCL 20 MG PO CAPS
40.0000 mg | ORAL_CAPSULE | Freq: Every day | ORAL | Status: DC
  Administered 2013-03-30 – 2013-04-02 (×4): 40 mg via ORAL
  Filled 2013-03-30 (×3): qty 2
  Filled 2013-03-30: qty 28
  Filled 2013-03-30 (×2): qty 2
  Filled 2013-03-30: qty 28

## 2013-03-30 MED ORDER — NICOTINE 21 MG/24HR TD PT24
21.0000 mg | MEDICATED_PATCH | Freq: Every day | TRANSDERMAL | Status: DC
  Administered 2013-03-30 – 2013-04-02 (×4): 21 mg via TRANSDERMAL
  Filled 2013-03-30 (×6): qty 1

## 2013-03-30 MED ORDER — CHLORDIAZEPOXIDE HCL 25 MG PO CAPS
50.0000 mg | ORAL_CAPSULE | Freq: Once | ORAL | Status: AC
  Administered 2013-03-30: 50 mg via ORAL
  Filled 2013-03-30: qty 1

## 2013-03-30 MED ORDER — CHLORDIAZEPOXIDE HCL 25 MG PO CAPS
25.0000 mg | ORAL_CAPSULE | Freq: Four times a day (QID) | ORAL | Status: AC | PRN
  Administered 2013-03-30 – 2013-03-31 (×2): 25 mg via ORAL
  Filled 2013-03-30 (×2): qty 1

## 2013-03-30 MED ORDER — CHLORDIAZEPOXIDE HCL 25 MG PO CAPS: 25.0000 mg | ORAL_CAPSULE | Freq: Every day | ORAL | Status: DC

## 2013-03-30 MED ORDER — MAGNESIUM HYDROXIDE 400 MG/5ML PO SUSP: 30.0000 mL | Freq: Every day | ORAL | Status: DC | PRN

## 2013-03-30 MED ORDER — CHLORDIAZEPOXIDE HCL 25 MG PO CAPS
25.0000 mg | ORAL_CAPSULE | Freq: Three times a day (TID) | ORAL | Status: AC
  Administered 2013-03-31 – 2013-04-01 (×3): 25 mg via ORAL
  Filled 2013-03-30 (×3): qty 1

## 2013-03-30 MED ORDER — ADULT MULTIVITAMIN W/MINERALS CH
1.0000 | ORAL_TABLET | Freq: Every day | ORAL | Status: DC
  Administered 2013-03-30 – 2013-04-02 (×4): 1 via ORAL
  Filled 2013-03-30 (×6): qty 1

## 2013-03-30 NOTE — Progress Notes (Signed)
Psychoeducational Group Note  Date:  03/30/2013 Time:  0945 am  Group Topic/Focus:  Identifying Needs:   The focus of this group is to help patients identify their personal needs that have been historically problematic and identify healthy behaviors to address their needs.  Participation Level:  Did Not Attend Tim [name] 03/30/2013,2:31 PM

## 2013-03-30 NOTE — BHH Group Notes (Signed)
BHH LCSW Group Therapy  Stages of Change 03/30/2013  11:11 AM    Type of Therapy:  Group Therapy  Participation Level:  Active  Participation Quality:  Appropriate  Affect:  Appropriate  Cognitive:  Appropriate  Insight:  Developing/Improving and Engaged  Engagement in Therapy:  Developing/Improving and Engaged  Modes of Intervention:  Discussion, Education, Exploration, Problem-Solving, Rapport Building, Support  Summary of Progress/Problems: The topic for today was the stages of changes.  Patient were asked to identify changes they need to make and their motivation and commitment to making changes in their lives.  He shared he is motivated to change but needs to go home and take care of his animals before going into a residential treatment.  Patient was cautioned that putting off treatment could sabotage his recovery.    Wynn Banker 03/30/2013  11:11 AM

## 2013-03-30 NOTE — Progress Notes (Signed)
Adult Psychoeducational Group Note  Date:  03/30/2013 Time:  0900  Group Topic/Focus:  Healthy Coping Skills  Participation Level:  Active  Participation Quality:  Appropriate, Attentive, Sharing and Supportive  Affect:  Appropriate  Cognitive:  Appropriate  Insight: Good  Engagement in Group:  Engaged and Supportive  Modes of Intervention:  Clarification, Discussion and Education  Additional Comments:  Pt was interested in group discussion and participated well.  Gildo Crisco Shari Prows 03/30/2013, 10:38 AM

## 2013-03-30 NOTE — Tx Team (Signed)
Initial Interdisciplinary Treatment Plan  PATIENT STRENGTHS: (choose at least two) Ability for insight Average or above average intelligence Capable of independent living Supportive family/friends  PATIENT STRESSORS: Financial difficulties Substance abuse   PROBLEM LIST: Problem List/Patient Goals Date to be addressed Date deferred Reason deferred Estimated date of resolution  ETOH/ cocaine abuse 03/30/2013     depression 03/30/2013                                                DISCHARGE CRITERIA:  Ability to meet basic life and health needs Medical problems require only outpatient monitoring Motivation to continue treatment in a less acute level of care Withdrawal symptoms are absent or subacute and managed without 24-hour nursing intervention  PRELIMINARY DISCHARGE PLAN: Attend aftercare/continuing care group Attend PHP/IOP Attend 12-step recovery group Return to previous living arrangement  PATIENT/FAMIILY INVOLVEMENT: This treatment plan has been presented to and reviewed with the patient, Tim [name], and/or family member,  The patient and family have been given the opportunity to ask questions and make suggestions.  Tim [name], Tim [name] 03/30/2013, 1:22 AM

## 2013-03-30 NOTE — Progress Notes (Signed)
Patient ID: Tim [name], male   DOB: 1956-09-29, 57 y.o.   MRN: [id number] Admission note: D:Patient is a  Voluntary admission in no acute distress for ETOH and cocaine abuse. Pt stated he drinks 12 packs of beers daily and uses cocaine about twice a month. Pt stated last cocaine use was 03/28/2013.  Pt report first drink was at age 41 and worsen in his 37's. Pt stated his car down and was unable to get to AA meeting and starting during more. Pt denies history of blackouts or seizures. Pt stated his goal after discharge is continue AA meetings and surrounding himself with positive friends.  A: Pt admitted to unit per protocol, skin assessment and belonging search done.  Pt educated on therapeutic milieu rules.  Pt was introduced to milieu by nursing staff. Fall risk safety plan explained to the patient. Food and drink offered and pt accepted. 15 minutes checks started for safety.   R: Pt was receptive to education. Writer offered support.

## 2013-03-30 NOTE — Progress Notes (Signed)
D   Pt is pleasant on approach  He reports some mild withdrawal symptoms  He said he slept ok last night and his appetite is ok   He denies suicidal and homicidal ideation A   Verbal support given   Medications administered and effectiveness monitored   Q 15 min checks R   Pt safe at present
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
## 2013-03-30 NOTE — Progress Notes (Signed)
D. Pt pleasant on approach, did not attend evening wrap up group as he stayed in bed.  Pt experiencing some withdrawal and just feeling "wiped out".  Denies SI/HI/hallucinations at this time.  Did get up for evening snacks, interacting appropriately with peers on unit.  A.  Support and encouragement offered, medication given as ordered for withdrawal.   R.  Pt remains safe on unit, will continue to monitor.
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
## 2013-03-31 NOTE — Progress Notes (Signed)
Date: 03/30/2013  Time:15:15pm  Group Topic/Focus:  Healthy Communication: The focus of this group is to discuss communication, barriers to communication, as well as healthy ways to communicate with others.  Participation Level: Active  Participation Quality: Appropriate, Sharing and Supportive  Affect: Appropriate  Cognitive: Appropriate  Insight: Appropriate  Engagement in Group: Engaged and Supportive  Modes of Intervention: Discussion, Education and Support  Additional Comments: Pt was very involved, participated and was very engaged with the group.  Willman Cuny M  03/30/2013, 15:15pm  

## 2013-03-31 NOTE — Progress Notes (Signed)
Psychoeducational Group Note  Date:  03/31/2013 Time:  0945 am  Group Topic/Focus:  Making Healthy Choices:   The focus of this group is to help patients identify negative/unhealthy choices they were using prior to admission and identify positive/healthier coping strategies to replace them upon discharge.  Participation Level:  Active  Participation Quality:  Appropriate, Attentive and Sharing  Affect:  Appropriate  Cognitive:  Alert and Appropriate  Insight:  Developing/Improving  Engagement in Group:  Developing/Improving  Additional Comments:    Andrena Mews 03/31/2013, 10:29 AM

## 2013-03-31 NOTE — Progress Notes (Signed)
Patient did attend the evening speaker AA meeting.  

## 2013-03-31 NOTE — Progress Notes (Signed)
[facility] MD Progress Note  03/31/2013 11:37 AM Tim [name]  MRN:  [id number] Subjective:  Tim [name] reports that he is doing really well today. He denies any cravings or withdrawal symptoms. He endorses sleeping very well last night, and his appetite is good. He denies feeling depressed, but endorses some anxiety about going back into the same environment that he came from. He rates his anxiety as a 3 on a scale of 1-10 where 10 is the worst. He denies any suicidal or homicidal ideation. He denies any auditory or visual hallucinations. He wants to return to attending 12 step meetings, and feels hopeful for the future in recovery from substance abuse. He reports that he enjoyed the group session this morning, and is enjoying interaction with the milieu.  Diagnosis:   Axis I: Generalized Anxiety Disorder, Major Depression, Recurrent severe, Substance Induced Mood Disorder and Alcohol dependence, cocaine abuse, cannabis abuse Axis II: Deferred Axis III:  Past Medical History  Diagnosis Date  . COPD (chronic obstructive pulmonary disease)   . Hep C w/o coma, chronic   . Mental health disorder   . Scoliosis   . Anxiety   . Depression     ADL's:  Intact  Sleep: Good  Appetite:  Good  Suicidal Ideation:  Patient denies any thought, plan, or intent Homicidal Ideation:  Patient denies any thought, plan, or intent AEB (as evidenced by):  Psychiatric Specialty Exam: Review of Systems  Constitutional: Negative.   HENT: Negative.   Eyes: Negative.   Respiratory: Negative.   Cardiovascular: Negative.   Gastrointestinal: Negative.   Genitourinary: Negative.   Musculoskeletal: Negative.   Skin: Negative.   Neurological: Negative.   Endo/Heme/Allergies: Negative.   Psychiatric/Behavioral: Negative for depression, suicidal ideas and hallucinations. The patient is nervous/anxious. The patient does not have insomnia.     Blood pressure 113/78, pulse 76, temperature 97.6 F (36.4 C),  temperature source Oral, resp. rate 16, height 6\' 3"  (1.905 m), weight 81.647 kg (180 lb).Body mass index is 22.5 kg/(m^2).  General Appearance: Casual  Eye Contact::  Good  Speech:  Clear and Coherent  Volume:  Normal  Mood:  Euthymic  Affect:  Appropriate  Thought Process:  Linear  Orientation:  Full (Time, Place, and Person)  Thought Content:  WDL  Suicidal Thoughts:  No  Homicidal Thoughts:  No  Memory:  Immediate;   Good Recent;   Good Remote;   Good  Judgement:  Fair  Insight:  Fair  Psychomotor Activity:  Normal  Concentration:  Good  Recall:  Good  Akathisia:  No  Handed:  Right  AIMS (if indicated):     Assets:  Communication Skills Desire for Improvement  Sleep:  Number of Hours: 6.5   Current Medications: Current Facility-Administered Medications  Medication Dose Route Frequency Provider Last Rate Last Dose  . acetaminophen (TYLENOL) tablet 650 mg  650 mg Oral Q6H PRN Court Joy, PA-C   650 mg at 03/30/13 0830  . alum & mag hydroxide-simeth (MAALOX/MYLANTA) 200-200-20 MG/5ML suspension 30 mL  30 mL Oral Q4H PRN Court Joy, PA-C      . chlordiazePOXIDE (LIBRIUM) capsule 25 mg  25 mg Oral Q6H PRN Court Joy, PA-C   25 mg at 03/30/13 2137  . chlordiazePOXIDE (LIBRIUM) capsule 25 mg  25 mg Oral QID Court Joy, PA-C   25 mg at 03/31/13 1016   Followed by  . chlordiazePOXIDE (LIBRIUM) capsule 25 mg  25 mg Oral TID Court Joy,  PA-C       Followed by  . [START ON 04/01/2013] chlordiazePOXIDE (LIBRIUM) capsule 25 mg  25 mg Oral BH-qamhs Court Joy, PA-C       Followed by  . [START ON 04/03/2013] chlordiazePOXIDE (LIBRIUM) capsule 25 mg  25 mg Oral Daily Court Joy, PA-C      . FLUoxetine (PROZAC) capsule 40 mg  40 mg Oral Daily Jorje Guild, PA-C   40 mg at 03/31/13 1017  . hydrOXYzine (ATARAX/VISTARIL) tablet 25 mg  25 mg Oral Q6H PRN Court Joy, PA-C      . loperamide (IMODIUM) capsule 2-4 mg  2-4 mg Oral PRN Court Joy, PA-C       . magnesium hydroxide (MILK OF MAGNESIA) suspension 30 mL  30 mL Oral Daily PRN Court Joy, PA-C      . multivitamin with minerals tablet 1 tablet  1 tablet Oral Daily Court Joy, PA-C   1 tablet at 03/31/13 1016  . nicotine (NICODERM CQ - dosed in mg/24 hours) patch 21 mg  21 mg Transdermal Q0600 Court Joy, PA-C   21 mg at 03/31/13 0604  . ondansetron (ZOFRAN-ODT) disintegrating tablet 4 mg  4 mg Oral Q6H PRN Court Joy, PA-C      . thiamine (VITAMIN B-1) tablet 100 mg  100 mg Oral Daily Court Joy, PA-C   100 mg at 03/31/13 1016  . traZODone (DESYREL) tablet 50 mg  50 mg Oral QHS PRN,MR X 1 Court Joy, PA-C   50 mg at 03/30/13 2136    Lab Results: No results found for this or any previous visit (from the past 48 hour(s)).  Physical Findings: AIMS: Facial and Oral Movements Muscles of Facial Expression: None, normal Lips and Perioral Area: None, normal Jaw: None, normal Tongue: None, normal,Extremity Movements Upper (arms, wrists, hands, fingers): None, normal Lower (legs, knees, ankles, toes): None, normal, Trunk Movements Neck, shoulders, hips: None, normal, Overall Severity Severity of abnormal movements (highest score from questions above): None, normal Incapacitation due to abnormal movements: None, normal Patient's awareness of abnormal movements (rate only patient's report): No Awareness, Dental Status Current problems with teeth and/or dentures?: No Does patient usually wear dentures?: No  CIWA:  CIWA-Ar Total: 6 COWS:     Treatment Plan Summary: Daily contact with patient to assess and evaluate symptoms and progress in treatment Medication management  Plan: We will continue his medical detox, and monitor for changes in mood or behavior.  Medical Decision Making Problem Points:  Established problem, stable/improving (1), Review of last therapy session (1) and Review of psycho-social stressors (1) Data Points:  Review or order clinical lab  tests (1) Review of medication regiment & side effects (2)  I certify that inpatient services furnished can reasonably be expected to improve the patient's condition.   WATT,ALAN 03/31/2013, 11:37 AM  Reviewed note, agree with findings and plan.  Jacqulyn Cane, M.D.  03/31/2013 8:06 PM

## 2013-03-31 NOTE — Progress Notes (Signed)
Psychoeducational Group Note  Date: 03/30/2013  Time: 2100  Group Topic/Focus:  wrap up group  Participation Level: Did Not Attend  Participation Quality:  Not Applicable  Affect:  Not Applicable  Cognitive:  Not Applicable  Insight:  Not Applicable  Engagement in Group: Not Applicable  Additional Comments:  Pt remained in bed.sleeping.    Shelah Lewandowsky 03/31/2013, 12:30 AM

## 2013-03-31 NOTE — Progress Notes (Signed)
D   Pt slept in this morning so he got his medications late   He attended group and participated appropriately   He is pleasant and cooperative  And compliant with all treatment  A   Verbal support given   Medications administered and effectiveness monitored   Q 15 min checks R   Pt safe at present
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
## 2013-03-31 NOTE — Progress Notes (Signed)
D.  Pt pleasant and bright on approach, positive for evening AA group.  Requested nighttime medications early so that he could go to bed.  Denies SI/HI/hallucinations at this time.  Minimal signs or symptoms of withdrawal noted.  Interacting appropriately within milieu.  A.  Support and encouragement offered  R.  Pt remains safe on unit, resting in bed with eyes closed, respirations even and unlabored

## 2013-04-01 MED ORDER — GABAPENTIN 100 MG PO CAPS
100.0000 mg | ORAL_CAPSULE | Freq: Three times a day (TID) | ORAL | Status: DC
  Administered 2013-04-01 – 2013-04-02 (×3): 100 mg via ORAL
  Filled 2013-04-01 (×2): qty 1
  Filled 2013-04-01 (×2): qty 42
  Filled 2013-04-01: qty 1
  Filled 2013-04-01: qty 42
  Filled 2013-04-01: qty 1
  Filled 2013-04-01 (×2): qty 42
  Filled 2013-04-01: qty 1
  Filled 2013-04-01: qty 42

## 2013-04-01 MED ORDER — HYDROXYZINE HCL 50 MG PO TABS
50.0000 mg | ORAL_TABLET | Freq: Every day | ORAL | Status: DC | PRN
  Administered 2013-04-01: 50 mg via ORAL
  Filled 2013-04-01: qty 1

## 2013-04-01 NOTE — Progress Notes (Signed)
Adult Psychoeducational Group Note  Date:  04/01/2013 Time:  10:40 PM  Group Topic/Focus:  Wrap-Up Group:   The focus of this group is to help patients review their daily goal of treatment and discuss progress on daily workbooks.  Participation Level:  Active  Participation Quality:  Appropriate and Attentive  Affect:  Appropriate  Cognitive:  Alert and Appropriate  Insight: Appropriate  Engagement in Group:  Engaged  Modes of Intervention:  Support  Additional Comments:  Pt shared that he had a good experience here; attended all the groups today. Pt also participated in the nightly activity of putting a note of thanks on the hall gratitude tree.   Humberto Seals Monique 04/01/2013, 10:40 PM

## 2013-04-01 NOTE — Progress Notes (Signed)
Pt reports he is still feeling anxious this evening.  He reports minimal withdrawal symptoms.  He denies SI/HI/AV.  He states he has been going to groups and participating.  He interacts appropriately with peers and staff.  Pt voices no other needs or concerns.  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  He wants to IOP, but says he has things to do first.  Support and encouragement offered.  Meds given as ordered.  Pt makes his needs known to staff.  Safety maintained with q15 minute checks.

## 2013-04-01 NOTE — Progress Notes (Signed)
Adult Psychoeducational Group Note  Date:  04/01/2013 Time:  11:00AM  Group Topic/Focus:  Self Care:   The focus of this group is to help patients understand the importance of self-care in order to improve or restore emotional, physical, spiritual, interpersonal, and financial health.  Participation Level:  Active  Participation Quality:  Appropriate, Monopolizing and Sharing  Affect:  Appropriate  Cognitive:  Appropriate  Insight: Appropriate  Engagement in Group:  Engaged  Modes of Intervention:  Discussion  Additional Comments:  Pt participated in group discussion and was active throughout group  Deeann Servidio K 04/01/2013, 1:05 PM

## 2013-04-01 NOTE — [facility] Group Notes (Signed)
[facility] LCSW Aftercare Discharge Planning Group Note   04/01/2013 9:42 AM  Participation Quality:  Appropriate  Mood/Affect:  Appropriate  Depression Rating:  4  Anxiety Rating:  6  Thoughts of Suicide:  No Will you contract for safety?   NA  Current AVH:  No  Plan for Discharge/Comments:  Pt reports that he livves in [employer] Plans to attend AA/NA and is interested in SA IOP but needs to work on transportation first. 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   Transportation Means: mother  Supports: mother  Counselling psychologist, Herbert Seta

## 2013-04-01 NOTE — Progress Notes (Signed)
Patient ID: Tim [name], male   DOB: Oct 28, 1955, 57 y.o.   MRN: [id number] [facility] MD Progress Note  04/01/2013 1:13 PM TADD HOLTMEYER  MRN:  [id number]  Subjective:  Tim [name] reports, "I just had a panic anxiety few minutes ago. I asked the nurse for my white anxiety pills, and she gave me only 1 pill. But I take 2 of those pills once a day at home if get very anxious. Now I have bad anxiety. That is why I drink for the most part, to quench that anxiety. I'm not depressed, just an anxious person for most of my life"  Diagnosis:   Axis I: Generalized Anxiety Disorder, Major Depression, Recurrent severe, Substance Induced Mood Disorder and Alcohol dependence, cocaine abuse, cannabis abuse Axis II: Deferred Axis III:  Past Medical History  Diagnosis Date  . COPD (chronic obstructive pulmonary disease)   . Hep C w/o coma, chronic   . Mental health disorder   . Scoliosis   . Anxiety   . Depression     ADL's:  Intact  Sleep: Good  Appetite:  Good  Suicidal Ideation:  Patient denies any thought, plan, or intent Homicidal Ideation:  Patient denies any thought, plan, or intent AEB (as evidenced by):  Psychiatric Specialty Exam: Review of Systems  Constitutional: Negative.   HENT: Negative.   Eyes: Negative.   Respiratory: Negative.   Cardiovascular: Negative.   Gastrointestinal: Negative.   Genitourinary: Negative.   Musculoskeletal: Negative.   Skin: Negative.   Neurological: Negative.   Endo/Heme/Allergies: Negative.   Psychiatric/Behavioral: Negative for depression, suicidal ideas and hallucinations. The patient is nervous/anxious. The patient does not have insomnia.     Blood pressure 117/69, pulse 79, temperature 97.3 F (36.3 C), temperature source Oral, resp. rate 18, height 6\' 3"  (1.905 m), weight 81.647 kg (180 lb).Body mass index is 22.5 kg/(m^2).  General Appearance: Casual  Eye Contact::  Good  Speech:  Clear and Coherent  Volume:  Normal  Mood:  Euthymic   Affect:  Appropriate  Thought Process:  Linear  Orientation:  Full (Time, Place, and Person)  Thought Content:  WDL  Suicidal Thoughts:  No  Homicidal Thoughts:  No  Memory:  Immediate;   Good Recent;   Good Remote;   Good  Judgement:  Fair  Insight:  Fair  Psychomotor Activity:  Normal  Concentration:  Good  Recall:  Good  Akathisia:  No  Handed:  Right  AIMS (if indicated):     Assets:  Communication Skills Desire for Improvement  Sleep:  Number of Hours: 6.5   Current Medications: Current Facility-Administered Medications  Medication Dose Route Frequency Provider Last Rate Last Dose  . acetaminophen (TYLENOL) tablet 650 mg  650 mg Oral Q6H PRN Court Joy, PA-C   650 mg at 03/30/13 0830  . alum & mag hydroxide-simeth (MAALOX/MYLANTA) 200-200-20 MG/5ML suspension 30 mL  30 mL Oral Q4H PRN Court Joy, PA-C      . chlordiazePOXIDE (LIBRIUM) capsule 25 mg  25 mg Oral Q6H PRN Court Joy, PA-C   25 mg at 03/31/13 2108  . chlordiazePOXIDE (LIBRIUM) capsule 25 mg  25 mg Oral BH-qamhs Court Joy, PA-C       Followed by  . [START ON 04/03/2013] chlordiazePOXIDE (LIBRIUM) capsule 25 mg  25 mg Oral Daily Court Joy, PA-C      . FLUoxetine (PROZAC) capsule 40 mg  40 mg Oral Daily Jorje Guild, PA-C   40 mg at  04/01/13 0824  . hydrOXYzine (ATARAX/VISTARIL) tablet 25 mg  25 mg Oral Q6H PRN Court Joy, PA-C      . loperamide (IMODIUM) capsule 2-4 mg  2-4 mg Oral PRN Court Joy, PA-C      . magnesium hydroxide (MILK OF MAGNESIA) suspension 30 mL  30 mL Oral Daily PRN Court Joy, PA-C      . multivitamin with minerals tablet 1 tablet  1 tablet Oral Daily Court Joy, PA-C   1 tablet at 04/01/13 1610  . nicotine (NICODERM CQ - dosed in mg/24 hours) patch 21 mg  21 mg Transdermal Q0600 Court Joy, PA-C   21 mg at 04/01/13 0600  . ondansetron (ZOFRAN-ODT) disintegrating tablet 4 mg  4 mg Oral Q6H PRN Court Joy, PA-C      . thiamine (VITAMIN B-1)  tablet 100 mg  100 mg Oral Daily Court Joy, PA-C   100 mg at 04/01/13 9604  . traZODone (DESYREL) tablet 50 mg  50 mg Oral QHS PRN,MR X 1 Court Joy, PA-C   50 mg at 03/31/13 2108    Lab Results: No results found for this or any previous visit (from the past 48 hour(s)).  Physical Findings: AIMS: Facial and Oral Movements Muscles of Facial Expression: None, normal Lips and Perioral Area: None, normal Jaw: None, normal Tongue: None, normal,Extremity Movements Upper (arms, wrists, hands, fingers): None, normal Lower (legs, knees, ankles, toes): None, normal, Trunk Movements Neck, shoulders, hips: None, normal, Overall Severity Severity of abnormal movements (highest score from questions above): None, normal Incapacitation due to abnormal movements: None, normal Patient's awareness of abnormal movements (rate only patient's report): No Awareness, Dental Status Current problems with teeth and/or dentures?: No Does patient usually wear dentures?: No  CIWA:  CIWA-Ar Total: 0 COWS:     Treatment Plan Summary: Daily contact with patient to assess and evaluate symptoms and progress in treatment Medication management  Plan: Supportive approach/coping skills/relapse prevention. Hydroxyzine 50 mg daily for extreme anxiety. Add Gabapentin 100 mg tid for anxiety. Encouraged out of room, participation in group sessions and application of coping skills when distressed. Will continue to monitor response to/adverse effects of medications in use to assure effectiveness. Continue to monitor mood, behavior and interaction with staff and other patients. Continue current plan of care.  Medical Decision Making Problem Points:  Established problem, stable/improving (1), Review of last therapy session (1) and Review of psycho-social stressors (1) Data Points:  Review or order clinical lab tests (1) Review of medication regiment & side effects (2)  I certify that inpatient services furnished  can reasonably be expected to improve the patient's condition.   Armandina Stammer I, PMHNP-BC. 04/01/2013, 1:13 PM

## 2013-04-01 NOTE — [facility] Group Notes (Signed)
[facility] LCSW Group Therapy  04/01/2013 2:03 PM  Type of Therapy:  Group Therapy  Participation Level:  Active  Participation Quality:  Appropriate  Affect:  Appropriate  Cognitive:  Appropriate  Insight:  Engaged  Engagement in Therapy:  Engaged  Modes of Intervention:  Discussion, Education, Exploration, Socialization and Support  Summary of Progress/Problems: Today's Topic: Overcoming Obstacles. Pt identified obstacles faced currently and processed barriers involved in overcoming these obstacles. Pt identified steps necessary for overcoming these obstacles and explored motivation (internal and external) for facing these difficulties head on. Pt further identified one area of concern in their lives and chose a skill of focus pulled from their "toolbox." Tim [name] talked about how communication is an obstacle for him and processed ways to improve his communication with others. Tim [name] stated that he saw great value in one:one and group therapy and plans to pursue this post discharge.    Tim [name], Tim [name] 04/01/2013, 2:03 PM

## 2013-04-01 NOTE — Progress Notes (Signed)
Pt reports his sleep as well.  His appetite is good energy hyper and ability to pay attention as poor.  He rated his depression a 1 hopelessness 3 and his anxiety a 4 on his self-inventory.  He denies any S/H ideation. Pt was agitated around 1420 he stated,"If I don't get something for this panic attack I might blackout and hurt someone" he went on to say that he was in prison for 12 years for manslaughter so "I know what I might do" pt plans to live with his mother and he would not be around people.  After he took the vistaril he went to his room gave him some time and went back to talk with pt he apologized for his behavior and was glad to be started on gabapentin.  Pt started on gabapentin today.  He wants in-patient but wants to go home first then go and he plans to go to AA/NA.

## 2013-04-01 NOTE — Tx Team (Signed)
Interdisciplinary Treatment Plan Update (Adult)  Date:  Time Reviewed:04/01/2013  Progress in Treatment: 10:00AM Attending groups: Yes Participating in groups:  Yes Taking medication as prescribed: Yes  Tolerating medication: Yes  Family/Significant othe contact made: Not yet Patient understands diagnosis: Yes, AEB seeking treatment for substance abuse, detox, and depressive symptoms.  Discussing patient identified problems/goals with staff: Yes  Medical problems stabilized or resolved: Yes  Denies suicidal/homicidal ideation: Yes  Patient has not harmed self or Others: Yes  New problem(s) identified: n/a  Discharge Plan or Barriers: Pt plans to attend AA, get a sponsor and follow up at [facility] for med management and groups. 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  Additional comments: Patient is a 57 yr old white male that requests detox from alcohol. Patient reports that he drinks a 12 pack of beer daily. Patient reports that he took 10 vistarils and 2 prozacs about 1 hour ago. Patient reports that he has been drinking heavily since his early 20's. Patient reports that his last drink was yesterday at 4pm. Patients BAL is 79. Patients UDS is positive foTetrahydrocannabinol and cocaine. Patient reports the following withdrawal symptoms chills, weakness, slight nausea. Patients CIWA score is 11. Patient reports a prior history of psychiatric hospitalizations in 2012, 2013 at [place]. Patient reports a previous mental health diagnosis of depression and anxiety. Patient denies any outpatient therapy. Patient reports that he is compliant with medication management. Patient reports that is currently taking Prozac 2 (20mg  daily). Patient reports previous substance abuse treatment and detox in 2014, 2013 and 2012 at [facility] and [facility]. Patient denies SI/HI. Patient denies psychosis.   Reason for Continuation of  Hospitalization: Librium taper Medication stabilization/withdrawals Depression Estimated length of stay: 2-3 days For review of initial/current patient goals, please see plan of care.  Attendees:  Patient:    Family:    Physician: Aggie RN 04/01/2013 4:22 PM   Nursing: Glenford Bayley 04/01/2013 4:22 PM   Clinical Social Worker [employer], LCSWA  04/01/2013 4:22 PM   Other:    Other:    Other:   Other:    Scribe for Treatment Team:  Trula Slade LCSWA 04/01/2013 4:22 PM

## 2013-04-02 MED ORDER — FLUOXETINE HCL 40 MG PO CAPS: 40.0000 mg | ORAL_CAPSULE | Freq: Every day | ORAL | Status: DC

## 2013-04-02 MED ORDER — HYDROXYZINE HCL 50 MG PO TABS: ORAL_TABLET | ORAL | Status: DC

## 2013-04-02 MED ORDER — HYDROXYZINE HCL 50 MG PO TABS
100.0000 mg | ORAL_TABLET | Freq: Every day | ORAL | Status: DC | PRN
  Filled 2013-04-02: qty 28

## 2013-04-02 MED ORDER — GABAPENTIN 100 MG PO CAPS: 100.0000 mg | ORAL_CAPSULE | Freq: Three times a day (TID) | ORAL | Status: DC

## 2013-04-02 MED ORDER — TRAZODONE HCL 50 MG PO TABS: 50.0000 mg | ORAL_TABLET | Freq: Every evening | ORAL | Status: DC | PRN

## 2013-04-02 NOTE — B[facility] Suicide Risk Assessment (Signed)
Suicide Risk Assessment  Discharge Assessment     Demographic Factors:  Male, Low socioeconomic status and Unemployed  Mental Status Per Nursing Assessment::   On Admission:  NA  Current Mental Status by Physician: Patient denies suicidal ideation, intent or plan  Loss Factors: Financial problems/change in socioeconomic status  Historical Factors: Family history of mental illness or substance abuse  Risk Reduction Factors:   Sense of responsibility to family and Positive social support  Continued Clinical Symptoms:  Alcohol/Substance Abuse/Dependencies  Cognitive Features That Contribute To Risk:  Closed-mindedness    Suicide Risk:  Minimal: No identifiable suicidal ideation.  Patients presenting with no risk factors but with morbid ruminations; may be classified as minimal risk based on the severity of the depressive symptoms  Discharge Diagnoses:   AXIS I:  Alcohol dependence  AXIS II:  Deferred AXIS III:   Past Medical History  Diagnosis Date  . COPD (chronic obstructive pulmonary disease)   . Hep C w/o coma, chronic   . Mental health disorder   . Scoliosis   . Anxiety   . Depression    AXIS IV:  other psychosocial or environmental problems and problems related to social environment AXIS V:  61-70 mild symptoms  Plan Of Care/Follow-up recommendations:  Activity:  as tolerated Diet:  healthy Tests:  routine Other:  patient to keep his after care appointment  Is patient on multiple antipsychotic therapies at discharge:  No   Has Patient had three or more failed trials of antipsychotic monotherapy by history:  No  Recommended Plan for Multiple Antipsychotic Therapies: N/A  Patrich Heinze,MD 04/02/2013, 11:11 AM
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
## 2013-04-02 NOTE — Progress Notes (Signed)
Pt was discharged home today.  He denied any S/I H/I or A/V hallucinations.  He was given f/u appointment, rx, sample medications, and hotline info booklet.  He voiced understanding to all instructions provided.  He declined the need for smoking cessation materials.  He removed his nicotine patch before he left. 

## 2013-04-02 NOTE — Progress Notes (Signed)
[facility] Adult Case Management Discharge Plan :  Will you be returning to the same living situation after discharge: Yes,  home At discharge, do you have transportation home?:Yes,  family member will come to pick him up Do you have the ability to pay for your medications:Yes,  mental health  Release of information consent forms completed and in the chart;  Patient's signature needed at discharge.  Patient to Follow up at: Follow-up Information   Follow up with [facility] . (Walk in between 8am-9am Monday thru Friday for hospital followup. )    Contact information:   [address] [place], [place] [address] phone: [phone] Fax: [phone]      Patient denies SI/HI:  Yes, in group  Safety Planning and Suicide Prevention discussed:  Yes,  SPE not needed with pt. No SI during admission or stay at [facility], [facility] 04/02/2013, 9:59 AM

## 2013-04-02 NOTE — [facility] Suicide Risk Assessment (Signed)
[facility] INPATIENT: Family/Significant Other Suicide Prevention Education  Suicide Prevention Education:  Education Completed; No one has been identified by the patient as the family member/significant other with whom the patient will be residing, and identified as the person(s) who will aid the patient in the event of a mental health crisis (suicidal ideations/suicide attempt). With written consent from the patient, the family member/significant other has been provided the following suicide prevention education, prior to the and/or following the discharge of the patient.  The suicide prevention education provided includes the following:  Suicide risk factors  Suicide prevention and interventions  National Suicide Hotline telephone number  [facility] assessment telephone number  [facility] Emergency Assistance 911  [facility] and/or Residential Mobile Crisis Unit telephone number Request made of family/significant other to:  Remove weapons (e.g., guns, rifles, knives), all items previously/currently identified as safety concern.  Remove drugs/medications (over-the-counter, prescriptions, illicit drugs), all items previously/currently identified as a safety concern. The family member/significant other verbalizes understanding of the suicide prevention education information provided. The family member/significant other agrees to remove the items of safety concern listed above.   Pt did not c/o SI at admission, nor have they endorsed SI during their stay here. SPE not required.  Tim [name] LCSWA 04/02/2013 10:01 AM

## 2013-04-02 NOTE — [facility] Group Notes (Signed)
[facility] LCSW Aftercare Discharge Planning Group Note   04/02/2013 9:22 AM  Participation Quality:  Appropriate   Mood/Affect:  Appropriate  Depression Rating:  3  Anxiety Rating:  4  Thoughts of Suicide:  No Will you contract for safety?   NA  Current AVH:  NA  Plan for Discharge/Comments:  Pt will follow up at [facility] for med management and groups. Pt will attend daily AA.  Transportation Means: family  Supports: family  [place], [place]

## 2013-04-03 NOTE — Discharge Summary (Signed)
Seen and agreed. Cayle Cordoba, MD 

## 2013-04-03 NOTE — Progress Notes (Signed)
Patient Discharge Instructions:  After Visit Summary (AVS):   Faxed to:  04/03/13 Discharge Summary Note:   Faxed to:  04/03/13 Psychiatric Admission Assessment Note:   Faxed to:  04/03/13 Suicide Risk Assessment - Discharge Assessment:   Faxed to:  04/03/13 Faxed/Sent to the Next Level Care provider:  04/03/13 Faxed to [facility] @ [phone]  Jerelene Redden, 04/03/2013, 4:06 PM

## 2013-06-11 ENCOUNTER — Emergency Department (HOSPITAL_COMMUNITY)
Admission: EM | Admit: 2013-06-11 | Discharge: 2013-06-12 | Disposition: A | Discharge status: Home / Self Care | Payer: Self-pay | Attending: Emergency Medicine | Admitting: Emergency Medicine

## 2013-06-11 ENCOUNTER — Encounter (HOSPITAL_COMMUNITY): Payer: Self-pay | Admitting: Emergency Medicine

## 2013-06-11 DIAGNOSIS — Z8619 Personal history of other infectious and parasitic diseases: Secondary | ICD-10-CM | POA: Insufficient documentation

## 2013-06-11 DIAGNOSIS — F1910 Other psychoactive substance abuse, uncomplicated: Secondary | ICD-10-CM

## 2013-06-11 DIAGNOSIS — F1410 Cocaine abuse, uncomplicated: Secondary | ICD-10-CM

## 2013-06-11 DIAGNOSIS — F17200 Nicotine dependence, unspecified, uncomplicated: Secondary | ICD-10-CM | POA: Insufficient documentation

## 2013-06-11 DIAGNOSIS — J4489 Other specified chronic obstructive pulmonary disease: Secondary | ICD-10-CM | POA: Insufficient documentation

## 2013-06-11 DIAGNOSIS — Z79899 Other long term (current) drug therapy: Secondary | ICD-10-CM | POA: Insufficient documentation

## 2013-06-11 DIAGNOSIS — J449 Chronic obstructive pulmonary disease, unspecified: Secondary | ICD-10-CM | POA: Insufficient documentation

## 2013-06-11 DIAGNOSIS — F1420 Cocaine dependence, uncomplicated: Secondary | ICD-10-CM | POA: Insufficient documentation

## 2013-06-11 DIAGNOSIS — F1010 Alcohol abuse, uncomplicated: Secondary | ICD-10-CM | POA: Insufficient documentation

## 2013-06-11 LAB — CBC WITH DIFFERENTIAL/PLATELET
Basophils Relative: 1 % (ref 0–1)
Eosinophils Absolute: 0.3 10*3/uL (ref 0.0–0.7)
Eosinophils Relative: 4 % (ref 0–5)
Hemoglobin: 14.7 g/dL (ref 13.0–17.0)
Lymphs Abs: 2.8 10*3/uL (ref 0.7–4.0)
MCH: 30.4 pg (ref 26.0–34.0)
MCHC: 34.3 g/dL (ref 30.0–36.0)
MCV: 88.6 fL (ref 78.0–100.0)
Monocytes Relative: 6 % (ref 3–12)
Neutrophils Relative %: 51 % (ref 43–77)
Platelets: 186 10*3/uL (ref 150–400)
RBC: 4.83 MIL/uL (ref 4.22–5.81)

## 2013-06-11 LAB — RAPID URINE DRUG SCREEN, HOSP PERFORMED
Amphetamines: NOT DETECTED
Benzodiazepines: NOT DETECTED
Cocaine: POSITIVE — AB
Opiates: NOT DETECTED

## 2013-06-11 LAB — COMPREHENSIVE METABOLIC PANEL
Albumin: 3.5 g/dL (ref 3.5–5.2)
BUN: 12 mg/dL (ref 6–23)
Calcium: 9.5 mg/dL (ref 8.4–10.5)
Creatinine, Ser: 1.19 mg/dL (ref 0.50–1.35)
GFR calc Af Amer: 78 mL/min — ABNORMAL LOW (ref >90)
Glucose, Bld: 90 mg/dL (ref 70–99)
Total Protein: 6.2 g/dL (ref 6.0–8.3)

## 2013-06-11 LAB — ETHANOL
Alcohol, Ethyl (B): 130 mg/dL — ABNORMAL HIGH (ref 0–11)
Alcohol, Ethyl (B): <11 mg/dL (ref 0–11)

## 2013-06-11 MED ORDER — ZOLPIDEM TARTRATE 5 MG PO TABS: 5.0000 mg | ORAL_TABLET | Freq: Every evening | ORAL | Status: DC | PRN

## 2013-06-11 MED ORDER — FLUOXETINE HCL 20 MG PO CAPS
40.0000 mg | ORAL_CAPSULE | Freq: Every day | ORAL | Status: DC
  Administered 2013-06-11 – 2013-06-12 (×2): 40 mg via ORAL
  Filled 2013-06-11 (×2): qty 2

## 2013-06-11 MED ORDER — LORAZEPAM 1 MG PO TABS: 0.0000 mg | ORAL_TABLET | Freq: Two times a day (BID) | ORAL | Status: DC

## 2013-06-11 MED ORDER — ONDANSETRON HCL 4 MG PO TABS: 4.0000 mg | ORAL_TABLET | Freq: Three times a day (TID) | ORAL | Status: DC | PRN

## 2013-06-11 MED ORDER — NICOTINE 7 MG/24HR TD PT24
7.0000 mg | MEDICATED_PATCH | Freq: Once | TRANSDERMAL | Status: AC
  Administered 2013-06-11: 7 mg via TRANSDERMAL
  Filled 2013-06-11: qty 1

## 2013-06-11 MED ORDER — ACETAMINOPHEN 325 MG PO TABS: 650.0000 mg | ORAL_TABLET | ORAL | Status: DC | PRN

## 2013-06-11 MED ORDER — ALUM & MAG HYDROXIDE-SIMETH 200-200-20 MG/5ML PO SUSP: 30.0000 mL | ORAL | Status: DC | PRN

## 2013-06-11 MED ORDER — POTASSIUM CHLORIDE CRYS ER 20 MEQ PO TBCR
40.0000 meq | EXTENDED_RELEASE_TABLET | Freq: Once | ORAL | Status: AC
  Administered 2013-06-11: 40 meq via ORAL
  Filled 2013-06-11: qty 2

## 2013-06-11 MED ORDER — LORAZEPAM 1 MG PO TABS: 0.0000 mg | ORAL_TABLET | Freq: Four times a day (QID) | ORAL | Status: DC

## 2013-06-11 MED ORDER — IBUPROFEN 200 MG PO TABS: 600.0000 mg | ORAL_TABLET | Freq: Three times a day (TID) | ORAL | Status: DC | PRN

## 2013-06-11 NOTE — Progress Notes (Signed)
[facility] ED CM Spoke with EDP, Fayrene Fearing about listed prozac 40 mg on admitting EDP note See orders

## 2013-06-11 NOTE — BH Assessment (Addendum)
Assessment Note  Tim [name] is an 57 y.o. male who presents to the ED requesting detox. CSW met with pt at bedside to complete [facility] assessment. Pt denies SI/HI/AH/VH. Pt reports drinking 4-40z beers per day. Pt last drink was before coming to the hosptial. Pt BAL was 130. Patient UDS positive for cocaine and thc. Pt reports smoking marijuana daily, a few blunts. Pt reports using cocaine once per month and not sure when he last used cocaine. Pt reports using 1/2 gram at times. Pt reports having a history of depression and is currently seen at [facility]. Pt reports feeling hopeless at times, and isolating himself some. Pt reports having support from his mother and sister. 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 Hep C w/o coma, chronic   . COPD (chronic obstructive pulmonary disease)    Axis IV: economic problems, occupational problems, other psychosocial or environmental problems, problems related to social environment and problems with primary support group Axis V: 41-50 serious symptoms  Past Medical History:  Past Medical History  Diagnosis Date  . Hep C w/o coma, chronic   . COPD (chronic obstructive pulmonary disease)     History reviewed. No pertinent past surgical history.  Family History: History reviewed. No pertinent family history.  Social History:  reports that he has been smoking Cigarettes.  He has been smoking about 0.50 packs per day. He does not have any smokeless tobacco history on file. He reports that  drinks alcohol. He reports that he uses illicit drugs (Marijuana).  Additional Social History:  Alcohol / Drug Use History of alcohol / drug use?: Yes Substance #1 Name of Substance 1: alcohol  1 - Age of First Use: 12 1 - Amount (size/oz): 4- 40z beers (160 oz)  1 - Frequency: daily 1 - Duration: years 1 -  Last Use / Amount: yesterday 4-40oz beers  Substance #2 Name of Substance 2: marijuana  2 - Age of First Use: 15 2 - Amount (size/oz): daily 2 - Frequency: few joints  2 - Duration: years 2 - Last Use / Amount: yesterday  Substance #3 Name of Substance 3: cocaine 3 - Age of First Use: 19 3 - Amount (size/oz): 1/2 gram 3 - Frequency: 1 per month 3 - Duration: years 3 - Last Use / Amount: unknown  CIWA: CIWA-Ar BP: 98/65 mmHg Pulse Rate: 58 Nausea and Vomiting: no nausea and no vomiting Tactile Disturbances: none Tremor: not visible, but can be felt fingertip to fingertip Auditory Disturbances: not present Paroxysmal Sweats: two Visual Disturbances: not present Anxiety: no anxiety, at ease Headache, Fullness in Head: none present Agitation: normal activity Orientation and Clouding of Sensorium: oriented and can do serial additions CIWA-Ar Total: 3 COWS:    Allergies: No Known Allergies  Home Medications:  (Not in a hospital admission)  OB/GYN Status:  No LMP for male patient.  General Assessment Data Location of Assessment: [facility] ED Is this a Tele or Face-to-Face Assessment?: Face-to-Face Is this an Initial Assessment or a Re-assessment for this encounter?: Initial Assessment Living Arrangements: Parent Can pt return to current living arrangement?: Yes Admission Status: Voluntary Is patient capable of signing voluntary admission?: Yes Transfer from: Home Referral Source: Self/Family/Friend     [facility] Crisis Care Plan Living Arrangements: Parent Name of Psychiatrist: [facility]  Education Status  Is patient currently in school?: No Highest grade of school patient has completed: GED  Risk to self Suicidal Ideation: No Suicidal Intent: No Is patient at risk for suicide?: No Suicidal Plan?: No Access to Means: No What has been your use of drugs/alcohol within the last 12 months?: no Previous Attempts/Gestures: No How many times?: 0 Other Self Harm Risks:  no Triggers for Past Attempts: None known Intentional Self Injurious Behavior: None Family Suicide History: No Recent stressful life event(s): Loss (Comment) Persecutory voices/beliefs?: No Depression: Yes Depression Symptoms: Despondent;Isolating Substance abuse history and/or treatment for substance abuse?: Yes  Risk to Others Homicidal Ideation: No Thoughts of Harm to Others: No Current Homicidal Intent: No Current Homicidal Plan: No Access to Homicidal Means: No Identified Victim: n/a History of harm to others?: No Assessment of Violence: None Noted Violent Behavior Description: no Does patient have access to weapons?: No Criminal Charges Pending?: No Does patient have a court date: No  Psychosis Hallucinations: None noted Delusions: None noted  Mental Status Report Appear/Hygiene: Disheveled Eye Contact: Good Motor Activity: Freedom of movement Speech: Logical/coherent Level of Consciousness: Alert Mood: Sad Affect: Appropriate to circumstance Anxiety Level: Minimal Thought Processes: Coherent;Relevant Judgement: Unimpaired Orientation: Person;Place;Time;Situation Obsessive Compulsive Thoughts/Behaviors: None  Cognitive Functioning Concentration: Normal Memory: Recent Intact;Remote Intact IQ: Average Insight: Fair Impulse Control: Poor Appetite: Good Sleep: No Change Total Hours of Sleep: 6 Vegetative Symptoms: None  ADLScreening [facility] Assessment Services) Patient's cognitive ability adequate to safely complete daily activities?: Yes Patient able to express need for assistance with ADLs?: Yes Independently performs ADLs?: Yes (appropriate for developmental age)  Prior Inpatient Therapy Prior Inpatient Therapy: Yes Prior Therapy Dates: 2014 Prior Therapy Facilty/Provider(s): [facility] Reason for Treatment: alcohol abuse  Prior Outpatient Therapy Prior Outpatient Therapy: Yes Prior Therapy Dates: ongoing Prior Therapy Facilty/Provider(s):  [facility] Reason for Treatment: depression  ADL Screening (condition at time of admission) Patient's cognitive ability adequate to safely complete daily activities?: Yes Patient able to express need for assistance with ADLs?: Yes Independently performs ADLs?: Yes (appropriate for developmental age)         Values / Beliefs Cultural Requests During Hospitalization: None Spiritual Requests During Hospitalization: None        Additional Information 1:1 In Past 12 Months?: No CIRT Risk: No Elopement Risk: No Does patient have medical clearance?: Yes     Disposition:  Disposition Initial Assessment Completed for this Encounter: Yes Disposition of Patient: Inpatient treatment program Type of inpatient treatment program: Adult  On Site Evaluation by:   Reviewed with Physician:    Catha Gosselin A 06/11/2013 9:36 AM

## 2013-06-11 NOTE — ED Provider Notes (Signed)
And agrees with Mr. Herberg. He can tell me his regular dose of Prozac 40 mg daily and dispensed a posterior him for some time. This will be continued here.  Claudean Kinds, MD 06/11/13 1430

## 2013-06-11 NOTE — Consult Note (Signed)
Reason for Consult intoxication Referring Physician: ER MD  Tim [name] is an 57 y.o. male.  HPI: long history of alcohol addiction with 6 or 7 previous  Rehabs.  Last rehab was in 2012 he says and he was sober for about one year,  Currently drinking 4 to 5 40 oz beers daily.  No particular stress, just cannot stop drinking, he says.  He does take Prozac for depression, he says.  Sometimes smokes marijuana and rarely uses cocaine, but mostly alcohol.  Past Medical History  Diagnosis Date  . Hep C w/o coma, chronic   . COPD (chronic obstructive pulmonary disease)     History reviewed. No pertinent past surgical history.  History reviewed. No pertinent family history.  Social History:  reports that he has been smoking Cigarettes.  He has been smoking about 0.50 packs per day. He does not have any smokeless tobacco history on file. He reports that  drinks alcohol. He reports that he uses illicit drugs (Marijuana).  Allergies: No Known Allergies  Medications: I have reviewed the patient's current medications.  Results for orders placed during the hospital encounter of 06/11/13 (from the past 48 hour(s))  URINE RAPID DRUG SCREEN (HOSP PERFORMED)     Status: Abnormal   Collection Time    06/11/13  3:30 AM      Result Value Range   Opiates NONE DETECTED  NONE DETECTED   Cocaine POSITIVE (*) NONE DETECTED   Benzodiazepines NONE DETECTED  NONE DETECTED   Amphetamines NONE DETECTED  NONE DETECTED   Tetrahydrocannabinol POSITIVE (*) NONE DETECTED   Barbiturates NONE DETECTED  NONE DETECTED   Comment:            DRUG SCREEN FOR MEDICAL PURPOSES     ONLY.  IF CONFIRMATION IS NEEDED     FOR ANY PURPOSE, NOTIFY LAB     WITHIN 5 DAYS.                LOWEST DETECTABLE LIMITS     FOR URINE DRUG SCREEN     Drug Class       Cutoff (ng/mL)     Amphetamine      1000     Barbiturate      200     Benzodiazepine   200     Tricyclics       300     Opiates          300     Cocaine          300      THC              50  CBC WITH DIFFERENTIAL     Status: None   Collection Time    06/11/13  3:40 AM      Result Value Range   WBC 7.3  4.0 - 10.5 K/uL   RBC 4.83  4.22 - 5.81 MIL/uL   Hemoglobin 14.7  13.0 - 17.0 g/dL   HCT 16.1  09.6 - 04.5 %   MCV 88.6  78.0 - 100.0 fL   MCH 30.4  26.0 - 34.0 pg   MCHC 34.3  30.0 - 36.0 g/dL   RDW 40.9  81.1 - 91.4 %   Platelets 186  150 - 400 K/uL   Neutrophils Relative % 51  43 - 77 %   Neutro Abs 3.7  1.7 - 7.7 K/uL   Lymphocytes Relative 39  12 - 46 %   Lymphs  Abs 2.8  0.7 - 4.0 K/uL   Monocytes Relative 6  3 - 12 %   Monocytes Absolute 0.4  0.1 - 1.0 K/uL   Eosinophils Relative 4  0 - 5 %   Eosinophils Absolute 0.3  0.0 - 0.7 K/uL   Basophils Relative 1  0 - 1 %   Basophils Absolute 0.1  0.0 - 0.1 K/uL  COMPREHENSIVE METABOLIC PANEL     Status: Abnormal   Collection Time    06/11/13  3:40 AM      Result Value Range   Sodium 139  135 - 145 mEq/L   Potassium 3.4 (*) 3.5 - 5.1 mEq/L   Chloride 105  96 - 112 mEq/L   CO2 24  19 - 32 mEq/L   Glucose, Bld 90  70 - 99 mg/dL   BUN 12  6 - 23 mg/dL   Creatinine, Ser 4.78  0.50 - 1.35 mg/dL   Calcium 9.5  8.4 - 29.5 mg/dL   Total Protein 6.2  6.0 - 8.3 g/dL   Albumin 3.5  3.5 - 5.2 g/dL   AST 17  0 - 37 U/L   ALT 11  0 - 53 U/L   Alkaline Phosphatase 79  39 - 117 U/L   Total Bilirubin 0.2 (*) 0.3 - 1.2 mg/dL   GFR calc non Af Amer 67 (*) >90 mL/min   GFR calc Af Amer 78 (*) >90 mL/min   Comment: (NOTE)     The eGFR has been calculated using the CKD EPI equation.     This calculation has not been validated in all clinical situations.     eGFR's persistently <90 mL/min signify possible Chronic Kidney     Disease.  ETHANOL     Status: Abnormal   Collection Time    06/11/13  3:40 AM      Result Value Range   Alcohol, Ethyl (B) 130 (*) 0 - 11 mg/dL   Comment:            LOWEST DETECTABLE LIMIT FOR     SERUM ALCOHOL IS 11 mg/dL     FOR MEDICAL PURPOSES ONLY    No results  found.  Review of Systems  Constitutional: Negative.   HENT: Negative.   Eyes: Negative.   Respiratory: Negative.   Cardiovascular: Negative.   Gastrointestinal: Negative.   Genitourinary: Negative.   Musculoskeletal: Negative.   Skin: Negative.   Neurological: Negative.   Endo/Heme/Allergies: Negative.   Psychiatric/Behavioral: Negative for depression and substance abuse.   Blood pressure 98/65, pulse 58, temperature 97.4 F (36.3 C), temperature source Oral, resp. rate 18, SpO2 97.00%. Physical Exam  Assessment/Plan: Monitor for withdrawal and seek inpatient bed for detox at [facility] if possible and if not then at whatever facility can be found for detox.  Rosanna Bickle D 06/11/2013, 9:30 AM

## 2013-06-11 NOTE — Progress Notes (Signed)
[facility] provided patient with a [employer] Orange Card application, highlighting [facility].  °

## 2013-06-11 NOTE — ED Notes (Signed)
Pt presented to the ED with a complaint of Medical clearance.  Pt last drink was around 0000 hours today.  Pt was driven here by [facility].

## 2013-06-11 NOTE — ED Provider Notes (Signed)
CSN: [id number]     Arrival date & time 06/11/13  0242 History   First MD Initiated Contact with Patient 06/11/13 0315     Chief Complaint  Patient presents with  . Medical Clearance   (Consider location/radiation/quality/duration/timing/severity/associated sxs/prior Treatment) The history is provided by the patient.   57 year old male presents requesting detox from alcohol. He states that he started drinking about 2 weeks ago and is drinking anywhere from 80-160 ounces of beer a day. He also admits to smoking marijuana and occasional use of cocaine. He has been through a detox in the past. He denies depression, hallucinations, suicidal ideation, homicidal ideation.  Past Medical History  Diagnosis Date  . Hep C w/o coma, chronic   . COPD (chronic obstructive pulmonary disease)    History reviewed. No pertinent past surgical history. History reviewed. No pertinent family history. History  Substance Use Topics  . Smoking status: Current Every Day Smoker -- 0.50 packs/day    Types: Cigarettes  . Smokeless tobacco: Not on file  . Alcohol Use: Yes    Review of Systems  All other systems reviewed and are negative.    Allergies  Review of patient's allergies indicates no known allergies.  Home Medications   Current Outpatient Rx  Name  Route  Sig  Dispense  Refill  . FLUoxetine (PROZAC) 40 MG capsule   Oral   Take 40 mg by mouth daily.           . hydrOXYzine (VISTARIL) 25 MG capsule   Oral   Take 25 mg by mouth 3 (three) times daily as needed.            BP 98/65  Pulse 58  Temp(Src) 97.4 F (36.3 C) (Oral)  Resp 18  SpO2 97% Physical Exam  Nursing note and vitals reviewed.  57 year old male, resting comfortably and in no acute distress. Vital signs are normal. Oxygen saturation is 97%, which is normal. Head is normocephalic and atraumatic. PERRLA, EOMI. Oropharynx is clear. Neck is nontender and supple without adenopathy or JVD. Back is nontender and there  is no CVA tenderness. Lungs are clear without rales, wheezes, or rhonchi. Chest is nontender. Heart has regular rate and rhythm without murmur. Abdomen is soft, flat, nontender without masses or hepatosplenomegaly and peristalsis is normoactive. Extremities have no cyanosis or edema, full range of motion is present. Skin is warm and dry without rash. Neurologic: Mental status is significant for slightly slurred speech consistent with alcohol intoxication, cranial nerves are intact, there are no motor or sensory deficits.  ED Course  Procedures (including critical care time) Results for orders placed during the hospital encounter of 06/11/13  CBC WITH DIFFERENTIAL      Result Value Range   WBC 7.3  4.0 - 10.5 K/uL   RBC 4.83  4.22 - 5.81 MIL/uL   Hemoglobin 14.7  13.0 - 17.0 g/dL   HCT 40.9  81.1 - 91.4 %   MCV 88.6  78.0 - 100.0 fL   MCH 30.4  26.0 - 34.0 pg   MCHC 34.3  30.0 - 36.0 g/dL   RDW 78.2  95.6 - 21.3 %   Platelets 186  150 - 400 K/uL   Neutrophils Relative % 51  43 - 77 %   Neutro Abs 3.7  1.7 - 7.7 K/uL   Lymphocytes Relative 39  12 - 46 %   Lymphs Abs 2.8  0.7 - 4.0 K/uL   Monocytes Relative 6  3 -  12 %   Monocytes Absolute 0.4  0.1 - 1.0 K/uL   Eosinophils Relative 4  0 - 5 %   Eosinophils Absolute 0.3  0.0 - 0.7 K/uL   Basophils Relative 1  0 - 1 %   Basophils Absolute 0.1  0.0 - 0.1 K/uL  COMPREHENSIVE METABOLIC PANEL      Result Value Range   Sodium 139  135 - 145 mEq/L   Potassium 3.4 (*) 3.5 - 5.1 mEq/L   Chloride 105  96 - 112 mEq/L   CO2 24  19 - 32 mEq/L   Glucose, Bld 90  70 - 99 mg/dL   BUN 12  6 - 23 mg/dL   Creatinine, Ser 5.78  0.50 - 1.35 mg/dL   Calcium 9.5  8.4 - 46.9 mg/dL   Total Protein 6.2  6.0 - 8.3 g/dL   Albumin 3.5  3.5 - 5.2 g/dL   AST 17  0 - 37 U/L   ALT 11  0 - 53 U/L   Alkaline Phosphatase 79  39 - 117 U/L   Total Bilirubin 0.2 (*) 0.3 - 1.2 mg/dL   GFR calc non Af Amer 67 (*) >90 mL/min   GFR calc Af Amer 78 (*) >90 mL/min   ETHANOL      Result Value Range   Alcohol, Ethyl (B) 130 (*) 0 - 11 mg/dL  URINE RAPID DRUG SCREEN (HOSP PERFORMED)      Result Value Range   Opiates NONE DETECTED  NONE DETECTED   Cocaine POSITIVE (*) NONE DETECTED   Benzodiazepines NONE DETECTED  NONE DETECTED   Amphetamines NONE DETECTED  NONE DETECTED   Tetrahydrocannabinol POSITIVE (*) NONE DETECTED   Barbiturates NONE DETECTED  NONE DETECTED    MDM   1. Alcohol abuse   2. Cocaine abuse    Alcohol intoxication and alcoholic. Screening labs have been obtained and consultation will be obtained with ACT Team To assist with appropriate placement. Old records have been reviewed and he has no relevant past records.    Dione Booze, MD 06/11/13 [phone]

## 2013-06-11 NOTE — Progress Notes (Signed)
Pt referred to [facility] pending review.  Pt referred to [facility] pending review.   Catha Gosselin, LCSW [phone]  ED CSW .06/11/2013 9:45am

## 2013-06-11 NOTE — Progress Notes (Addendum)
Spoke with Okey Regal at [employer].  Pt accepted.  They need authorization information.  Once authorization is complete, need to contact Okey Regal at [phone] to inform her of how pt is traveling and when to expect him.  Contacted LME for authorization.  Spoke with [employer].  She stated that a treatment authorization form needed to be completed and that she would email it to CSW to fax back to [phone] to complete authorization process.  Marva Panda, Theresia Majors  [phone]  .06/11/2013 6:19 pm   CSW contacted Okey Regal at [facility] to inform her that the authorization paperwork had not been received yet, and that the last train to [facility] leaves at 6:50 p.m.  Per Okey Regal, it is ok to send patient tomorrow once authorization is received and pt will still be accepted.  Marva Panda, LCSWA  [phone] 06/11/2013 6:48 pm

## 2013-06-12 DIAGNOSIS — F329 Major depressive disorder, single episode, unspecified: Secondary | ICD-10-CM

## 2013-06-12 MED ORDER — NICOTINE 21 MG/24HR TD PT24
21.0000 mg | MEDICATED_PATCH | Freq: Every day | TRANSDERMAL | Status: DC
  Administered 2013-06-12: 21 mg via TRANSDERMAL
  Filled 2013-06-12: qty 1

## 2013-06-12 NOTE — Progress Notes (Signed)
B.Jermanie Minshall,MHT provided follow up from note P.Mangum, LCSWA stating of a need to obtain authorization from [employer] in efforts to secure detox placement at [facility] for Mr.Pincus. Writer contacted Janus Molder at [facility] to confirm placement availability prior to seeking authorization. Janus Molder reports that patients' placement has been held and to provide authorization. Writer contacted [employer] and provided [employer] and [employer] score along with treatment plan in efforts to obtain authorization, patient has been previously enrolled and required update only. Authorization was obtained for detox beginning 06/12/13 to 06/14/13 authorization # [id number] provided and submitted to [facility] at [facility] along with completed pre screen form. Writer informed attending RN. Racheal at [facility] of patients placement and that patient will need assistance with linkage to facility via cab voucher or train voucher which will require assistance from social work at [facility]. Writer informed Marcus,[facility] of needed follow up to take place as well as leaving a voice message in assessment office at [facility] ED. Writer spoke with Janus Molder at [facility] provided update of transfer information regarding patients arrival. Janus Molder request that [facility] be notified when patient is discharged from [facility] and is in route to facility. Contact [facility] at [phone]

## 2013-06-12 NOTE — Progress Notes (Signed)
Face to face interview Patient states that he continues to have some depression.  Denies Suicidal ideation, homicidal ideation, psychosis, and paranoia.  Patient states that he is ready to start his rehab so that he will be able to get his life together.  Patient accepted to [facility] and should transfer today.  Shuvon B. Rankin FNP-BC Family Nurse Practitioner, Board Certified  I agreed with the findings, treatment and disposition plan of this patient. Kathryne Sharper, MD

## 2013-06-12 NOTE — Progress Notes (Addendum)
CSW spoke with [facility] regarding patient admission for detox. CSW awaiting return call from [facility] regarding transportation from train station to detox facility. Trains leaves [place] at 1:34pm   .Catha Gosselin, LCSW [phone]  ED CSW .06/12/2013 1132pm   CSW confirmed with [facility] that transportation will be provided from train station in [place]. Pt provided with bus pass and train ticket to [place]. EDP and RN aware. .No further Clinical Social Work needs, signing off.   Catha Gosselin, LCSW [phone]  ED CSW .06/12/2013 1228pm

## 2013-06-12 NOTE — ED Notes (Signed)
D/C instructions given-stated understanding. Given bus pass and train ticket. No new prescriptions ordered. Belongs bag x2 returned. No complaints voiced. Ambulatory without difficulty. Escorted by mental health tech to front of hospital.

## 2013-06-19 ENCOUNTER — Encounter (HOSPITAL_COMMUNITY): Payer: Self-pay

## 2013-10-22 ENCOUNTER — Encounter (HOSPITAL_COMMUNITY): Payer: Self-pay | Admitting: Emergency Medicine

## 2013-10-22 ENCOUNTER — Emergency Department (HOSPITAL_COMMUNITY)
Admission: EM | Admit: 2013-10-22 | Discharge: 2013-10-22 | Disposition: A | Discharge status: Home / Self Care | Payer: Self-pay | Attending: Emergency Medicine | Admitting: Emergency Medicine

## 2013-10-22 DIAGNOSIS — J4489 Other specified chronic obstructive pulmonary disease: Secondary | ICD-10-CM | POA: Insufficient documentation

## 2013-10-22 DIAGNOSIS — Z79899 Other long term (current) drug therapy: Secondary | ICD-10-CM | POA: Insufficient documentation

## 2013-10-22 DIAGNOSIS — J449 Chronic obstructive pulmonary disease, unspecified: Secondary | ICD-10-CM | POA: Insufficient documentation

## 2013-10-22 DIAGNOSIS — F411 Generalized anxiety disorder: Secondary | ICD-10-CM | POA: Insufficient documentation

## 2013-10-22 DIAGNOSIS — Z8619 Personal history of other infectious and parasitic diseases: Secondary | ICD-10-CM | POA: Insufficient documentation

## 2013-10-22 DIAGNOSIS — Z8739 Personal history of other diseases of the musculoskeletal system and connective tissue: Secondary | ICD-10-CM | POA: Insufficient documentation

## 2013-10-22 DIAGNOSIS — F1020 Alcohol dependence, uncomplicated: Secondary | ICD-10-CM | POA: Insufficient documentation

## 2013-10-22 DIAGNOSIS — F3289 Other specified depressive episodes: Secondary | ICD-10-CM | POA: Insufficient documentation

## 2013-10-22 DIAGNOSIS — F329 Major depressive disorder, single episode, unspecified: Secondary | ICD-10-CM | POA: Insufficient documentation

## 2013-10-22 DIAGNOSIS — F1210 Cannabis abuse, uncomplicated: Secondary | ICD-10-CM | POA: Insufficient documentation

## 2013-10-22 DIAGNOSIS — F17200 Nicotine dependence, unspecified, uncomplicated: Secondary | ICD-10-CM | POA: Insufficient documentation

## 2013-10-22 DIAGNOSIS — F1410 Cocaine abuse, uncomplicated: Secondary | ICD-10-CM | POA: Insufficient documentation

## 2013-10-22 LAB — ETHANOL: ALCOHOL ETHYL (B): 84 mg/dL — AB (ref 0–11)

## 2013-10-22 LAB — CBC WITH DIFFERENTIAL/PLATELET
Basophils Absolute: 0.1 10*3/uL (ref 0.0–0.1)
Basophils Relative: 1 % (ref 0–1)
Eosinophils Absolute: 0.2 10*3/uL (ref 0.0–0.7)
Eosinophils Relative: 2 % (ref 0–5)
HCT: 48.7 % (ref 39.0–52.0)
HEMOGLOBIN: 17.3 g/dL — AB (ref 13.0–17.0)
LYMPHS ABS: 2.6 10*3/uL (ref 0.7–4.0)
Lymphocytes Relative: 29 % (ref 12–46)
MCH: 32.3 pg (ref 26.0–34.0)
MCHC: 35.5 g/dL (ref 30.0–36.0)
MCV: 90.9 fL (ref 78.0–100.0)
MONOS PCT: 4 % (ref 3–12)
Monocytes Absolute: 0.4 10*3/uL (ref 0.1–1.0)
NEUTROS ABS: 5.7 10*3/uL (ref 1.7–7.7)
NEUTROS PCT: 64 % (ref 43–77)
PLATELETS: 237 10*3/uL (ref 150–400)
RBC: 5.36 MIL/uL (ref 4.22–5.81)
RDW: 13.7 % (ref 11.5–15.5)
WBC: 8.9 10*3/uL (ref 4.0–10.5)

## 2013-10-22 LAB — COMPREHENSIVE METABOLIC PANEL
ALBUMIN: 4.2 g/dL (ref 3.5–5.2)
ALK PHOS: 78 U/L (ref 39–117)
ALT: 16 U/L (ref 0–53)
AST: 20 U/L (ref 0–37)
BILIRUBIN TOTAL: 0.4 mg/dL (ref 0.3–1.2)
BUN: 11 mg/dL (ref 6–23)
CHLORIDE: 100 meq/L (ref 96–112)
CO2: 26 mEq/L (ref 19–32)
Calcium: 9.8 mg/dL (ref 8.4–10.5)
Creatinine, Ser: 1.08 mg/dL (ref 0.50–1.35)
GFR calc Af Amer: 87 mL/min — ABNORMAL LOW (ref >90)
GFR calc non Af Amer: 75 mL/min — ABNORMAL LOW (ref >90)
GLUCOSE: 48 mg/dL — AB (ref 70–99)
POTASSIUM: 3.5 meq/L — AB (ref 3.7–5.3)
SODIUM: 141 meq/L (ref 137–147)
Total Protein: 7.6 g/dL (ref 6.0–8.3)

## 2013-10-22 NOTE — ED Provider Notes (Signed)
Medical screening examination/treatment/procedure(s) were performed by non-physician practitioner and as supervising physician I was immediately available for consultation/collaboration.   Loren Raceravid Angelina Neece, MD 10/22/13 [phone]

## 2013-10-22 NOTE — ED Notes (Signed)
Pt in requesting detox from ETOH, last use four hours ago, states he drinks approx 12 beers a day or more, occasional cocaine or mariajuana use

## 2013-10-22 NOTE — Discharge Instructions (Signed)
Finding Treatment for Alcohol and Drug Addiction It can be hard to find the right place to get professional treatment. Here are some important things to consider:  There are different types of treatment to choose from.  Some programs are live-in (residential) while others are not (outpatient). Sometimes a combination is offered.  No single type of program is right for everyone.  Most treatment programs involve a combination of education, counseling, and a 12-step, spiritually-based approach.  There are non-spiritually based programs (not 12-step).  Some treatment programs are government sponsored. They are geared for patients without private insurance.  Treatment programs can vary in many respects such as:  Cost and types of insurance accepted.  Types of on-site medical services offered.  Length of stay, setting, and size.  Overall philosophy of treatment. A person may need specialized treatment or have needs not addressed by all programs. For example, adolescents need treatment appropriate for their age. Other people have secondary disorders that must be managed as well. Secondary conditions can include mental illness, such as depression or diabetes. Often, a period of detoxification from alcohol or drugs is needed. This requires medical supervision and not all programs offer this. THINGS TO CONSIDER WHEN SELECTING A TREATMENT PROGRAM   Is the program certified by the appropriate government agency? Even private programs must be certified and employ certified professionals.  Does the program accept your insurance? If not, can a payment plan be set up?  Is the facility clean, organized, and well run? Do they allow you to speak with graduates who can share their treatment experience with you? Can you tour the facility? Can you meet with staff?  Does the program meet the full range of individual needs?  Does the treatment program address sexual orientation and physical disabilities?  Do they provide age, gender, and culturally appropriate treatment services?  Is treatment available in languages other than English?  Is long-term aftercare support or guidance encouraged and provided?  Is assessment of an individual's treatment plan ongoing to ensure it meets changing needs?  Does the program use strategies to encourage reluctant patients to remain in treatment long enough to increase the likelihood of success?  Does the program offer counseling (individual or group) and other behavioral therapies?  Does the program offer medicine as part of the treatment regimen, if needed?  Is there ongoing monitoring of possible relapse? Is there a defined relapse prevention program? Are services or referrals offered to family members to ensure they understand addiction and the recovery process? This would help them support the recovering individual.  Are 12-step meetings held at the center or is transport available for patients to attend outside meetings? In countries outside of the [place], Magazine features editorsee local directories for contact information for services in your area. Document Released: 09/01/2005 Document Revised: 12/26/2011 Document Reviewed: 03/13/2008 [facility] Patient Information 2014 [employer] [place].  Emergency Department Resource Guide 1) Find a Doctor and Pay Out of Pocket Although you won't have to find out who is covered by your insurance plan, it is a good idea to ask around and get recommendations. You will then need to call the office and see if the doctor you have chosen will accept you as a new patient and what types of options they offer for patients who are self-pay. Some doctors offer discounts or will set up payment plans for their patients who do not have insurance, but you will need to ask so you aren't surprised when you get to your appointment.  2) Contact Your Local Health Department °Not all health departments have doctors that can see patients for sick  visits, but many do, so it is worth a call to see if yours does. If you don't know where your local health department is, you can check in your phone book. The CDC also has a tool to help you locate your state's health department, and many state websites also have listings of all of their local health departments. ° °3) Find a Walk-in Clinic °If your illness is not likely to be very severe or complicated, you may want to try a walk in clinic. These are popping up all over the country in pharmacies, drugstores, and shopping centers. They're usually staffed by nurse practitioners or physician assistants that have been trained to treat common illnesses and complaints. They're usually fairly quick and inexpensive. However, if you have serious medical issues or chronic medical problems, these are probably not your best option. ° °No Primary Care Doctor: °- Call Health Connect at  832-8000 - they can help you locate a primary care doctor that  accepts your insurance, provides certain services, etc. °- Physician Referral Service- 1-800-533-3463 ° °Chronic Pain Problems: °Organization         Address  Phone   Notes  °[facility]  (336) 297-2271 Patients need to be referred by their primary care doctor.  ° °Medication Assistance: °Organization         Address  Phone   Notes  °[place] [facility] [address] °[place], [place] [address] (336) 641-8030 --Must be a resident of [place] °-- Must have NO insurance coverage whatsoever (no Medicaid/ Medicare, etc.) °-- The pt. MUST have a primary care doctor that directs their care regularly and follows them in the community °  °[facility]  (866) 331-1348   °United Way  (888) 892-1162   ° °Agencies that provide inexpensive medical care: °Organization         Address  Phone   Notes  °[facility]  (336) 832-8035   °[facility]    (336) 832-7272   °[facility] [address] °[place], [place] [address] (336) 832-4777   °[facility] [address], °[place] (336) 271-4999   °[facility]    (336) 373-0678   °[facility]    (336) 272-1050   °[facility] ° [address], [place] Phone:  (336) 832-4444, Fax:  (336) 832-4440 Hours of Operation:  9 am - 6 pm, M-F.  Also accepts Medicaid/Medicare and self-pay.  °[facility] ° [address], [place] Phone: (336) 832-3150, Fax: (336) 832-3151. Hours of Operation:  8:30 am - 5:30 pm, M-F.  Also accepts Medicaid and self-pay.  °[facility] [place] [address], [place] Phone: (336) 878-6027   °[facility] [address], [place], [place] (336)723-1848, Ext. 123 Mondays & Thursdays: 7-9 AM.  First 15 patients are seen on a first come, first serve basis. °  ° °Medicaid-accepting [place] Providers: ° °Organization         Address  Phone   Notes  °[facility] [address], [place] (336) 641-2100 Also accepts self-pay patients.  °[facility] [address], [place] ° (336) 856-9996   °[facility] [address], [place] (336) 288-8857   °[facility] [address] [place] [address], [place] (336) 299-7000   °Veita Bland [address], [place]  ° (336) 373-1557 Only accepts [place] Access Medicaid patients after they have their name applied to their card.  ° °Self-Pay (no insurance) in [place]: ° °Organization         Address  Phone   Notes  °Sickle Cell Patients, [facility] [address], [place] (336) 832-1970   °[facility] [address], [place] (336) 832-4400   °[facility] ° [address], [place] (336) 992-4800   °[facility]/Dr. Osei-Bonsu ° [address] [place] [address],  [place] or [address], [place] (336) 841-8500 Phone number for both [place] and [place] locations is the same.  °[facility] [address], [place] (336) 299-0000   °[facility] [address] [place] [address], [place] or [address] (336) 852-7530 °(336) 878-2260   °[facility] [address], [place] (336) 350-1642, phone; (336) 294-5005, fax Sees patients 1st and 3rd Saturday of every month.  Must not qualify for public or private insurance (i.e. Medicaid, Medicare, [place] Health Choice, Veterans' Benefits) • Household income should be no more than 200% of the poverty level •The clinic cannot treat you if you are pregnant or think you are pregnant • Sexually transmitted diseases are not treated at the clinic.  ° ° °Dental Care: °Organization         Address  Phone  Notes  °[facility] [facility] [address], [place] (336) 641-6152 Accepts children up to age 21 who are enrolled in Medicaid or [place] Health Choice; pregnant women with a Medicaid card; and children who have applied for Medicaid or [place] Health Choice, but were declined, whose parents can pay a reduced fee at time of service.  °[facility] [place]  [address], [place] (336) 641-7733 Accepts children up to age 21 who are enrolled in Medicaid or [place] Health Choice; pregnant women with a Medicaid card; and children who have applied for Medicaid or  Health Choice, but were declined, whose parents can pay a reduced fee at time of service.  °[facility] ° [address],  (336) 641-4533 Patients are seen by appointment only. Walk-ins are not accepted. [facility] will see patients 18 years of age and older. °Monday - Tuesday (8am-5pm) °Most Wednesdays (8:30-5pm) °$30 per visit, cash only  °[facility] ° [address], [place] (336) 641-4533 Patients are seen by appointment only. Walk-ins are not accepted. [facility] will see patients 18 years of age and older. °One Wednesday Evening (Monthly: Volunteer Based).  $30 per visit, cash only  °[facility]  (919) 537-3737 for adults; Children under age 4, call Graduate Pediatric Dentistry at ([phone]) [phone]. Children aged 4-14, please call (919) [phone] to request a pediatric application. ° Dental services are provided in all areas of dental care including fillings, crowns and bridges, complete and partial dentures, implants, gum treatment, root canals, and extractions. Preventive care is also provided. Treatment is provided to both adults and children. °Patients are selected via a lottery and there is often a waiting list. °  °[facility] [address], °  [place] ° (336) 763-8833 [email or web address].[email or web address] °  °[facility] [address], [place], [place] (336)723-1848, Ext. 123 Second and Fourth Thursday of each month, opens at 6:30 AM; Clinic ends at 9 AM.  Patients are seen on a first-come first-served basis, and a limited number are seen during each clinic.  ° °[facility] ° [address], [place], [place] (336) 723-7904   Eligibility Requirements °You must have lived in [place], [place], or [place] counties for at least the last three months. °  You cannot be eligible for state or federal sponsored healthcare insurance, including Veterans Administration, Medicaid, or Medicare. °  You generally cannot be eligible for healthcare insurance through your employer.  °  How to apply: °Eligibility screenings are held every Tuesday and Wednesday afternoon from 1:00 pm until 4:00 pm. You do not need an appointment for the interview!  °[facility] [address], [place], [place] 336-631-2330   °[facility]  336-342-8273   °[facility]  336-703-3100   °[facility] [facility]  336-570-6415   ° °Behavioral Health Resources in the Community: °Intensive Outpatient Programs °Organization         Address  Phone  Notes  °[facility] [address], [place], [place] 336-878-6098   °[facility] [address], [place], [place] 336-832-9800   °[facility] [address], [place], [place] ° 336-882-2125   °[facility] [address],  °[place], [place] 1-800-853-5163 or 336-641-4981   °Substance Abuse Resources °Organization         Address  Phone  Notes  °[facility]  336-882-2125   °[facility]  336-784-9470   °[facility]  336-285-9073   °[facility]  336-845-3988   °Residential & Outpatient Substance Abuse Program  1-800-659-3381   °Psychological Services °Organization         Address  Phone  Notes  °[facility]  336- 832-9600   °[facility]  336- 378-7881   °[facility] [address], [place] 1-800-853-5163 or 336-641-4981   ° °Mobile Crisis Teams °Organization         Address  Phone  Notes  °[facility], Mobile Crisis Care Unit  1-877-626-1772   °Assertive °[facility] ° [address] [place], [place] 336-834-9664   °Sharon DeEsch [address] °[place] 336-554-5454   ° °Self-Help/Support Groups °Organization         Address  Phone             Notes  °[facility] [place] - variety of support groups  336- 373-1402 Call for more information  °Narcotics Anonymous (NA), [facility] [address], °[place]  2 meetings at this location  ° °Residential Treatment Programs °Organization         Address  Phone  Notes  °[facility] [address],    °[place]  1-866-801-8205   °[facility] ° [address], [place], [place] 704-293-8524   °[facility] [address], [place]  336-845-3988 Admissions: 8am-3pm M-F  °[facility] [address]    °[place], [place] 336-841-1104   °[facility] [address], [place], [place] 336-379-7146   °[facility] [address]  °[place],  336-285-9073   °  [facility] - Intensive Outpatient [address], [place], [place] 161-096-0454   [facility] ([facility] [address]  [place], [place] 0-981-191-4782 or 684-250-2572   [facility] ([facility] [address], [place], [place] 784-696-2952 Accepts Medicaid  [facility] [address],  [place] 8-413-244-0102 Substance Abuse/Addiction Treatment   [facility]         Address  Phone  Notes  [facility]  860-761-1388   Angie Fava, PhD [address] [place], [place]   770-818-4676 or 215-032-3975   [facility]   [address] [place], [place] 671-485-2026   [facility] [address], [place], [place] 609-482-3468 Insurance/Medicaid/sponsorship through [facility] [address]                                    [place], [place] 765-696-0720 Therapy/tele-psych/case  [facility] [address], [place] (949)[phone]    Dr. Lolly Mustache  312-076-2874   [facility]  [facility] Dept. 1) [address], [place] 2) [address] [place] 3)  [address], [place] 212-811-7259 310-801-1321  762-534-2079   [facility] Child Abuse Hotline 347-602-3147 or [phone] (After Hours)     You have been give a list of resources  Please call these facilities for availability of treatment programs

## 2013-10-22 NOTE — ED Provider Notes (Signed)
CSN: [id number]     Arrival date & time 10/22/13  0108 History   First MD Initiated Contact with Patient 10/22/13 [phone]     Chief Complaint  Patient presents with  . Detox    (Consider location/radiation/quality/duration/timing/severity/associated sxs/prior Treatment) HPI Comments: Patient is a chronic alocholic and occasional drug abuser [place] wanting residential treatment.  Denies SI/HI   The history is provided by the patient.    Past Medical History  Diagnosis Date  . Mental health disorder   . Scoliosis   . Anxiety   . Depression   . Hep C w/o coma, chronic   . COPD (chronic obstructive pulmonary disease)    Past Surgical History  Procedure Laterality Date  . Thyroid surgery    . Skin cancer excision     History reviewed. No pertinent family history. History  Substance Use Topics  . Smoking status: Current Every Day Smoker -- 0.50 packs/day for 40 years    Types: Cigarettes  . Smokeless tobacco: Not on file  . Alcohol Use: Yes     Comment: 12/daily    Review of Systems  Constitutional: Negative for fever.  Respiratory: Negative for shortness of breath.   Cardiovascular: Negative for chest pain.  Gastrointestinal: Negative for abdominal pain.  Musculoskeletal: Negative for back pain.  Neurological: Negative for dizziness and headaches.  Psychiatric/Behavioral: Negative for suicidal ideas. The patient is not nervous/anxious.   All other systems reviewed and are negative.    Allergies  Review of patient's allergies indicates no known allergies.  Home Medications   Current Outpatient Rx  Name  Route  Sig  Dispense  Refill  . FLUoxetine (PROZAC) 40 MG capsule   Oral   Take 40 mg by mouth daily.           Marland Kitchen. FLUoxetine (PROZAC) 40 MG capsule   Oral   Take 1 capsule (40 mg total) by mouth daily. For depression   30 capsule   0   . gabapentin (NEURONTIN) 100 MG capsule   Oral   Take 1 capsule (100 mg total)  by mouth 3 (three) times daily. For anxiety symptoms   90 capsule   0   . hydrOXYzine (ATARAX/VISTARIL) 50 MG tablet      Take 2 tablets daily as needed for anxiety   60 tablet   0   . hydrOXYzine (VISTARIL) 25 MG capsule   Oral   Take 25 mg by mouth 3 (three) times daily as needed.           . traZODone (DESYREL) 50 MG tablet   Oral   Take 50 mg by mouth at bedtime as needed for sleep.         . traZODone (DESYREL) 50 MG tablet   Oral   Take 1 tablet (50 mg total) by mouth at bedtime as needed and may repeat dose one time if needed for sleep. For sleep   60 tablet   0    BP 109/65  Pulse 81  Temp(Src) 98.1 F (36.7 C) (Oral)  Resp 20  SpO2 100% Physical Exam  Nursing note and vitals reviewed. Constitutional: He is oriented to person, place, and time. He appears well-nourished.  HENT:  Poor dental hygeine  Eyes: Pupils are equal, round, and reactive to light.  Neck: Normal range of motion.  Cardiovascular: Normal rate and regular rhythm.   Pulmonary/Chest: Effort normal.  Neurological: He is alert and oriented to person, place,  and time.  Skin: No rash noted.  Psychiatric: His speech is normal. Judgment normal. His mood appears not anxious. Cognition and memory are normal. He expresses no homicidal and no suicidal ideation. He expresses no suicidal plans and no homicidal plans.    ED Course  Procedures (including critical care time) Labs Review Labs Reviewed  CBC WITH DIFFERENTIAL - Abnormal; Notable for the following:    Hemoglobin 17.3 (*)    All other components within normal limits  COMPREHENSIVE METABOLIC PANEL - Abnormal; Notable for the following:    Potassium 3.5 (*)    Glucose, Bld 48 (*)    GFR calc non Af Amer 75 (*)    GFR calc Af Amer 87 (*)    All other components within normal limits  ETHANOL - Abnormal; Notable for the following:    Alcohol, Ethyl (B) 84 (*)    All other components within normal limits  URINE RAPID DRUG SCREEN (HOSP  PERFORMED)   Imaging Review No results found.  EKG Interpretation   None       MDM   1. Alcohol dependence    Patient given community resources     Arman Filter, NP 10/22/13 [phone]

## 2014-03-24 ENCOUNTER — Emergency Department (HOSPITAL_COMMUNITY)
Admission: EM | Admit: 2014-03-24 | Discharge: 2014-03-25 | Disposition: A | Discharge status: Home / Self Care | Payer: Self-pay | Attending: Emergency Medicine | Admitting: Emergency Medicine

## 2014-03-24 DIAGNOSIS — F411 Generalized anxiety disorder: Secondary | ICD-10-CM | POA: Insufficient documentation

## 2014-03-24 DIAGNOSIS — F3289 Other specified depressive episodes: Secondary | ICD-10-CM | POA: Insufficient documentation

## 2014-03-24 DIAGNOSIS — Z79899 Other long term (current) drug therapy: Secondary | ICD-10-CM | POA: Insufficient documentation

## 2014-03-24 DIAGNOSIS — J449 Chronic obstructive pulmonary disease, unspecified: Secondary | ICD-10-CM | POA: Insufficient documentation

## 2014-03-24 DIAGNOSIS — J4489 Other specified chronic obstructive pulmonary disease: Secondary | ICD-10-CM | POA: Insufficient documentation

## 2014-03-24 DIAGNOSIS — F329 Major depressive disorder, single episode, unspecified: Secondary | ICD-10-CM | POA: Insufficient documentation

## 2014-03-24 DIAGNOSIS — F17200 Nicotine dependence, unspecified, uncomplicated: Secondary | ICD-10-CM | POA: Insufficient documentation

## 2014-03-24 DIAGNOSIS — Z8619 Personal history of other infectious and parasitic diseases: Secondary | ICD-10-CM | POA: Insufficient documentation

## 2014-03-24 DIAGNOSIS — R109 Unspecified abdominal pain: Secondary | ICD-10-CM | POA: Insufficient documentation

## 2014-03-24 DIAGNOSIS — R110 Nausea: Secondary | ICD-10-CM | POA: Insufficient documentation

## 2014-03-24 NOTE — ED Notes (Signed)
Pt presents by EMS with c/o left side abdominal pain. Pt says that he vomited once prior to EMS arrival, no nausea or vomiting en route. Pt has a hx of ETOH abuse, has had some alcohol today. Ambulatory in triage.

## 2014-03-25 ENCOUNTER — Encounter (HOSPITAL_COMMUNITY): Payer: Self-pay | Admitting: Emergency Medicine

## 2014-03-25 LAB — CBC WITH DIFFERENTIAL/PLATELET
Basophils Absolute: 0 10*3/uL (ref 0.0–0.1)
Basophils Relative: 0 % (ref 0–1)
Eosinophils Absolute: 0.1 10*3/uL (ref 0.0–0.7)
Eosinophils Relative: 1 % (ref 0–5)
HCT: 45.7 % (ref 39.0–52.0)
Hemoglobin: 15.9 g/dL (ref 13.0–17.0)
Lymphocytes Relative: 13 % (ref 12–46)
Lymphs Abs: 1.6 10*3/uL (ref 0.7–4.0)
MCH: 31.9 pg (ref 26.0–34.0)
MCHC: 34.8 g/dL (ref 30.0–36.0)
MCV: 91.8 fL (ref 78.0–100.0)
Monocytes Absolute: 0.4 10*3/uL (ref 0.1–1.0)
Monocytes Relative: 3 % (ref 3–12)
Neutro Abs: 9.9 10*3/uL — ABNORMAL HIGH (ref 1.7–7.7)
Neutrophils Relative %: 83 % — ABNORMAL HIGH (ref 43–77)
Platelets: 200 10*3/uL (ref 150–400)
RBC: 4.98 MIL/uL (ref 4.22–5.81)
RDW: 13 % (ref 11.5–15.5)
WBC: 12 10*3/uL — ABNORMAL HIGH (ref 4.0–10.5)

## 2014-03-25 LAB — COMPREHENSIVE METABOLIC PANEL
ALT: 17 U/L (ref 0–53)
AST: 25 U/L (ref 0–37)
Albumin: 3.8 g/dL (ref 3.5–5.2)
Alkaline Phosphatase: 65 U/L (ref 39–117)
BUN: 14 mg/dL (ref 6–23)
CO2: 29 mEq/L (ref 19–32)
Calcium: 10 mg/dL (ref 8.4–10.5)
Chloride: 101 mEq/L (ref 96–112)
Creatinine, Ser: 1.21 mg/dL (ref 0.50–1.35)
GFR calc Af Amer: 76 mL/min — ABNORMAL LOW (ref >90)
GFR calc non Af Amer: 65 mL/min — ABNORMAL LOW (ref >90)
Glucose, Bld: 225 mg/dL — ABNORMAL HIGH (ref 70–99)
Potassium: 3.8 mEq/L (ref 3.7–5.3)
Sodium: 141 mEq/L (ref 137–147)
Total Bilirubin: <0.2 mg/dL — ABNORMAL LOW (ref 0.3–1.2)
Total Protein: 7 g/dL (ref 6.0–8.3)

## 2014-03-25 LAB — ETHANOL: Alcohol, Ethyl (B): <11 mg/dL (ref 0–11)

## 2014-03-25 LAB — LIPASE, BLOOD: Lipase: 53 U/L (ref 11–59)

## 2014-03-25 MED ORDER — GI COCKTAIL ~~LOC~~
30.0000 mL | Freq: Once | ORAL | Status: AC
  Administered 2014-03-25: 30 mL via ORAL
  Filled 2014-03-25: qty 30

## 2014-03-25 MED ORDER — PROMETHAZINE HCL 25 MG PO TABS: 25.0000 mg | ORAL_TABLET | Freq: Four times a day (QID) | ORAL | Status: DC | PRN

## 2014-03-25 MED ORDER — ONDANSETRON 4 MG PO TBDP
4.0000 mg | ORAL_TABLET | Freq: Once | ORAL | Status: AC
  Administered 2014-03-25: 4 mg via ORAL
  Filled 2014-03-25: qty 1

## 2014-03-25 MED ORDER — RANITIDINE HCL 150 MG PO CAPS: 150.0000 mg | ORAL_CAPSULE | Freq: Every day | ORAL | Status: DC

## 2014-03-25 NOTE — ED Provider Notes (Signed)
CSN: [id number]     Arrival date & time 03/24/14  2233 History   First MD Initiated Contact with Patient 03/25/14 0206     Chief Complaint  Patient presents with  . Abdominal Pain     (Consider location/radiation/quality/duration/timing/severity/associated sxs/prior Treatment) HPI Pt is a 58yo male with hx of anxiety, depression, Hep C, COPD, mental health disorder, and ETOH abuse brought to ED via EMS c/o LUQ abdominal pain that started earlier today after drinking alcohol. States pain is intermittent, gradually improving, 5/10, sharp in nature.  Nothing makes pain better or worse. Reports associated nausea but denies vomiting or diarrhea. Denies fever. Denies abdominal surgery. No hx of pancreatitis.    Past Medical History  Diagnosis Date  . Mental health disorder   . Scoliosis   . Anxiety   . Depression   . Hep C w/o coma, chronic   . COPD (chronic obstructive pulmonary disease)    Past Surgical History  Procedure Laterality Date  . Thyroid surgery    . Skin cancer excision     History reviewed. No pertinent family history. History  Substance Use Topics  . Smoking status: Current Every Day Smoker -- 0.50 packs/day for 40 years    Types: Cigarettes  . Smokeless tobacco: Not on file  . Alcohol Use: Yes     Comment: 12/daily    Review of Systems  Constitutional: Negative for fever and chills.  Respiratory: Negative for cough and shortness of breath.   Cardiovascular: Negative for chest pain and palpitations.  Gastrointestinal: Positive for nausea and abdominal pain. Negative for vomiting, diarrhea, constipation and blood in stool.  Genitourinary: Negative for dysuria, frequency, hematuria and flank pain.  Musculoskeletal: Negative for back pain and myalgias.  All other systems reviewed and are negative.     Allergies  Review of patient's allergies indicates no known allergies.  Home Medications   Prior to Admission medications   Medication Sig Start Date End  Date Taking? Authorizing Provider  FLUoxetine (PROZAC) 40 MG capsule Take 40 mg by mouth daily.     Yes Historical Provider, MD  gabapentin (NEURONTIN) 100 MG capsule Take 1 capsule (100 mg total) by mouth 3 (three) times daily. For anxiety symptoms 04/02/13  Yes Sanjuana KavaAgnes I Nwoko, NP  ibuprofen (ADVIL,MOTRIN) 200 MG tablet Take 400 mg by mouth every 6 (six) hours as needed (pain).   Yes Historical Provider, MD  traZODone (DESYREL) 50 MG tablet Take 50 mg by mouth at bedtime as needed for sleep.   Yes Historical Provider, MD  promethazine (PHENERGAN) 25 MG tablet Take 1 tablet (25 mg total) by mouth every 6 (six) hours as needed for nausea or vomiting. 03/25/14   Junius FinnerErin O'Malley, PA-C  ranitidine (ZANTAC) 150 MG capsule Take 1 capsule (150 mg total) by mouth daily. 03/25/14   Junius FinnerErin O'Malley, PA-C   BP 112/74  Pulse 65  Temp(Src) 98.1 F (36.7 C) (Oral)  Resp 16  SpO2 97% Physical Exam  Nursing note and vitals reviewed. Constitutional: He appears well-developed and well-nourished.  Pt appears unkempt. Non-toxic, NAD.  HENT:  Head: Normocephalic and atraumatic.  Eyes: Conjunctivae are normal. No scleral icterus.  Neck: Normal range of motion.  Cardiovascular: Normal rate, regular rhythm and normal heart sounds.   Pulmonary/Chest: Effort normal and breath sounds normal. No respiratory distress. He has no wheezes. He has no rales. He exhibits no tenderness.  Abdominal: Soft. Bowel sounds are normal. He exhibits no distension and no mass. There is tenderness ( mild  LUQ and epigastric tenderness). There is no rebound and no guarding.  Soft, non-distended, mild LUQ and epigastric tenderness. No rebound, guarding or masses. No CVAT  Musculoskeletal: Normal range of motion.  Neurological: He is alert.  Skin: Skin is warm and dry.    ED Course  Procedures (including critical care time) Labs Review Labs Reviewed  CBC WITH DIFFERENTIAL - Abnormal; Notable for the following:    WBC 12.0 (*)     Neutrophils Relative % 83 (*)    Neutro Abs 9.9 (*)    All other components within normal limits  COMPREHENSIVE METABOLIC PANEL - Abnormal; Notable for the following:    Glucose, Bld 225 (*)    Total Bilirubin <0.2 (*)    GFR calc non Af Amer 65 (*)    GFR calc Af Amer 76 (*)    All other components within normal limits  LIPASE, BLOOD  ETHANOL    Imaging Review No results found.   EKG Interpretation None      MDM   Final diagnoses:  Abdominal pain  Nausea   Pt is a 58yo male brought to ED c/u LUQ pain associated with nausea. On exam, pt appears disheveled but otherwise NAD.  Abd-soft, non-distended. Mild tenderness. Not concerned for surgical abdomen. Pt did have mildly elevated WBC-12 on CBC and elevated glucose of 225, labs otherwise unremarkable. Pt given zofran, GI cocktail and able to complete PO fluid challenge. Not concerned for SBO, perforated ulcer, esophageal rupture, or other emergent process taking place. No evidence of pancreatitis. Will discharge home with zantac and phenergan. Advised to f/u with PCP. Return precautions provided. Pt verbalized understanding and agreement with tx plan.   Junius Finner, PA-C 03/25/14 [phone]

## 2014-03-25 NOTE — ED Provider Notes (Signed)
Medical screening examination/treatment/procedure(s) were performed by non-physician practitioner and as supervising physician I was immediately available for consultation/collaboration.   EKG Interpretation None        Enid Skeens, MD 03/25/14 [phone]

## 2014-03-25 NOTE — Discharge Instructions (Signed)
Abdominal Pain, Adult  Many things can cause belly (abdominal) pain. Most times, the belly pain is not dangerous. Many cases of belly pain can be watched and treated at home.  HOME CARE   · Do not take medicines that help you go poop (laxatives) unless told to by your doctor.  · Only take medicine as told by your doctor.  · Eat or drink as told by your doctor. Your doctor will tell you if you should be on a special diet.  GET HELP IF:  · You do not know what is causing your belly pain.  · You have belly pain while you are sick to your stomach (nauseous) or have runny poop (diarrhea).  · You have pain while you pee or poop.  · Your belly pain wakes you up at night.  · You have belly pain that gets worse or better when you eat.  · You have belly pain that gets worse when you eat fatty foods.  GET HELP RIGHT AWAY IF:   · The pain does not go away within 2 hours.  · You have a fever.  · You keep throwing up (vomiting).  · The pain changes and is only in the right or left part of the belly.  · You have bloody or tarry looking poop.  MAKE SURE YOU:   · Understand these instructions.  · Will watch your condition.  · Will get help right away if you are not doing well or get worse.  Document Released: 03/21/2008 Document Revised: 07/24/2013 Document Reviewed: 06/12/2013  Exit[employer]® Patient Information ©2014 [employer].

## 2014-06-05 ENCOUNTER — Ambulatory Visit: Payer: Self-pay

## 2014-06-23 ENCOUNTER — Emergency Department (HOSPITAL_COMMUNITY)
Admission: EM | Admit: 2014-06-23 | Discharge: 2014-06-24 | Disposition: A | Discharge status: Home / Self Care | Payer: Self-pay | Attending: Emergency Medicine | Admitting: Emergency Medicine

## 2014-06-23 ENCOUNTER — Encounter (HOSPITAL_COMMUNITY): Payer: Self-pay | Admitting: Emergency Medicine

## 2014-06-23 DIAGNOSIS — F32A Depression, unspecified: Secondary | ICD-10-CM

## 2014-06-23 DIAGNOSIS — F329 Major depressive disorder, single episode, unspecified: Secondary | ICD-10-CM | POA: Insufficient documentation

## 2014-06-23 DIAGNOSIS — F1410 Cocaine abuse, uncomplicated: Secondary | ICD-10-CM | POA: Insufficient documentation

## 2014-06-23 DIAGNOSIS — Z8739 Personal history of other diseases of the musculoskeletal system and connective tissue: Secondary | ICD-10-CM | POA: Insufficient documentation

## 2014-06-23 DIAGNOSIS — Z79899 Other long term (current) drug therapy: Secondary | ICD-10-CM | POA: Insufficient documentation

## 2014-06-23 DIAGNOSIS — F411 Generalized anxiety disorder: Secondary | ICD-10-CM | POA: Insufficient documentation

## 2014-06-23 DIAGNOSIS — F3289 Other specified depressive episodes: Secondary | ICD-10-CM | POA: Insufficient documentation

## 2014-06-23 DIAGNOSIS — J449 Chronic obstructive pulmonary disease, unspecified: Secondary | ICD-10-CM | POA: Insufficient documentation

## 2014-06-23 DIAGNOSIS — Z008 Encounter for other general examination: Secondary | ICD-10-CM | POA: Insufficient documentation

## 2014-06-23 DIAGNOSIS — F17200 Nicotine dependence, unspecified, uncomplicated: Secondary | ICD-10-CM | POA: Insufficient documentation

## 2014-06-23 DIAGNOSIS — J4489 Other specified chronic obstructive pulmonary disease: Secondary | ICD-10-CM | POA: Insufficient documentation

## 2014-06-23 LAB — RAPID URINE DRUG SCREEN, HOSP PERFORMED
AMPHETAMINES: NOT DETECTED
BARBITURATES: NOT DETECTED
BENZODIAZEPINES: NOT DETECTED
Cocaine: POSITIVE — AB
Opiates: NOT DETECTED
Tetrahydrocannabinol: NOT DETECTED

## 2014-06-23 LAB — CBC WITH DIFFERENTIAL/PLATELET
BASOS PCT: 0 % (ref 0–1)
Basophils Absolute: 0 10*3/uL (ref 0.0–0.1)
Eosinophils Absolute: 0.1 10*3/uL (ref 0.0–0.7)
Eosinophils Relative: 1 % (ref 0–5)
HEMATOCRIT: 46.3 % (ref 39.0–52.0)
HEMOGLOBIN: 16.6 g/dL (ref 13.0–17.0)
LYMPHS PCT: 19 % (ref 12–46)
Lymphs Abs: 1.7 10*3/uL (ref 0.7–4.0)
MCH: 32.7 pg (ref 26.0–34.0)
MCHC: 35.9 g/dL (ref 30.0–36.0)
MCV: 91.1 fL (ref 78.0–100.0)
MONO ABS: 0.4 10*3/uL (ref 0.1–1.0)
MONOS PCT: 4 % (ref 3–12)
NEUTROS ABS: 7 10*3/uL (ref 1.7–7.7)
NEUTROS PCT: 76 % (ref 43–77)
Platelets: 209 10*3/uL (ref 150–400)
RBC: 5.08 MIL/uL (ref 4.22–5.81)
RDW: 13.3 % (ref 11.5–15.5)
WBC: 9.1 10*3/uL (ref 4.0–10.5)

## 2014-06-23 LAB — COMPREHENSIVE METABOLIC PANEL
ALBUMIN: 4.2 g/dL (ref 3.5–5.2)
ALK PHOS: 78 U/L (ref 39–117)
ALT: 22 U/L (ref 0–53)
ANION GAP: 16 — AB (ref 5–15)
AST: 28 U/L (ref 0–37)
BILIRUBIN TOTAL: 0.5 mg/dL (ref 0.3–1.2)
BUN: 13 mg/dL (ref 6–23)
CHLORIDE: 100 meq/L (ref 96–112)
CO2: 20 meq/L (ref 19–32)
CREATININE: 1.12 mg/dL (ref 0.50–1.35)
Calcium: 10.1 mg/dL (ref 8.4–10.5)
GFR calc Af Amer: 83 mL/min — ABNORMAL LOW (ref >90)
GFR, EST NON AFRICAN AMERICAN: 72 mL/min — AB (ref >90)
Glucose, Bld: 106 mg/dL — ABNORMAL HIGH (ref 70–99)
POTASSIUM: 4 meq/L (ref 3.7–5.3)
Sodium: 136 mEq/L — ABNORMAL LOW (ref 137–147)
Total Protein: 7.6 g/dL (ref 6.0–8.3)

## 2014-06-23 LAB — ETHANOL: Alcohol, Ethyl (B): 136 mg/dL — ABNORMAL HIGH (ref 0–11)

## 2014-06-23 MED ORDER — LORAZEPAM 1 MG PO TABS: 0.0000 mg | ORAL_TABLET | Freq: Four times a day (QID) | ORAL | Status: DC

## 2014-06-23 MED ORDER — VITAMIN B-1 100 MG PO TABS
100.0000 mg | ORAL_TABLET | Freq: Every day | ORAL | Status: DC
  Administered 2014-06-23 – 2014-06-24 (×2): 100 mg via ORAL
  Filled 2014-06-23 (×2): qty 1

## 2014-06-23 MED ORDER — LORAZEPAM 2 MG/ML IJ SOLN: 0.0000 mg | Freq: Two times a day (BID) | INTRAMUSCULAR | Status: DC

## 2014-06-23 MED ORDER — LORAZEPAM 1 MG PO TABS: 0.0000 mg | ORAL_TABLET | Freq: Two times a day (BID) | ORAL | Status: DC

## 2014-06-23 MED ORDER — THIAMINE HCL 100 MG/ML IJ SOLN: 100.0000 mg | Freq: Every day | INTRAMUSCULAR | Status: DC

## 2014-06-23 MED ORDER — LORAZEPAM 2 MG/ML IJ SOLN: 0.0000 mg | Freq: Four times a day (QID) | INTRAMUSCULAR | Status: DC

## 2014-06-23 NOTE — BHH Counselor (Addendum)
TC to [place] at [facility]. She states she has already spoken to someone at [facility] and that pt was declined d/t occasional SI.  Evette Cristal, [place] Assessment Counselor

## 2014-06-23 NOTE — ED Provider Notes (Signed)
CSN: [id number]     Arrival date & time 06/23/14  0020 History   None   This chart was scribed for Loren Racer, MD by Tonye Royalty, ED Scribe. This patient was seen in room [facility] and the patient's care was started at 12:36 AM.     Chief Complaint  Patient presents with  . Medical Clearance   The history is provided by the patient. No language interpreter was used.    HPI Comments: Tim [name] is a 58 y.o. male who presents to the Emergency Department with psychiatric complaint. He reports difficulty functioning in society due to being in prison for 12 years even after being out for 20; he also reports difficulty with raising thoughts. He states he has been off his prescribed Prozac for 2 weeks. He states he has been hospitalized for psychiatric reasons in the past and states that he is currently seeking admission to an institution. He denies hearing voices or acute suicidal or homicidal ideation though he reports difficulty finding meaning in life.  Past Medical History  Diagnosis Date  . Mental health disorder   . Scoliosis   . Anxiety   . Depression   . Hep C w/o coma, chronic   . COPD (chronic obstructive pulmonary disease)    Past Surgical History  Procedure Laterality Date  . Thyroid surgery    . Skin cancer excision     No family history on file. History  Substance Use Topics  . Smoking status: Current Every Day Smoker -- 0.50 packs/day for 40 years    Types: Cigarettes  . Smokeless tobacco: Not on file  . Alcohol Use: Yes     Comment: 12/daily    Review of Systems  Constitutional: Negative for fever and chills.  Respiratory: Negative for cough and shortness of breath.   Cardiovascular: Negative for chest pain.  Gastrointestinal: Negative for nausea, vomiting and abdominal pain.  Musculoskeletal: Negative for back pain, neck pain and neck stiffness.  Skin: Negative for rash and wound.  Neurological: Negative for dizziness, weakness, light-headedness,  numbness and headaches.  Psychiatric/Behavioral: Positive for dysphoric mood. Negative for suicidal ideas, hallucinations and self-injury.      Allergies  Review of patient's allergies indicates no known allergies.  Home Medications   Prior to Admission medications   Medication Sig Start Date End Date Taking? Authorizing Provider  gabapentin (NEURONTIN) 100 MG capsule Take 1 capsule (100 mg total) by mouth 3 (three) times daily. For anxiety symptoms 04/02/13  Yes Sanjuana Kava, NP  ibuprofen (ADVIL,MOTRIN) 200 MG tablet Take 400 mg by mouth every 6 (six) hours as needed (pain).   Yes Historical Provider, MD  ranitidine (ZANTAC) 150 MG capsule Take 1 capsule (150 mg total) by mouth daily. 03/25/14  Yes Junius Finner, PA-C  traZODone (DESYREL) 50 MG tablet Take 50 mg by mouth at bedtime as needed for sleep.   Yes Historical Provider, MD  FLUoxetine (PROZAC) 40 MG capsule Take 40 mg by mouth daily.      Historical Provider, MD  promethazine (PHENERGAN) 25 MG tablet Take 1 tablet (25 mg total) by mouth every 6 (six) hours as needed for nausea or vomiting. 03/25/14   Junius Finner, PA-C   BP 115/78  Pulse 78  Temp(Src) 97.9 F (36.6 C) (Oral)  Resp 20  Ht 6' 2.5" (1.892 m)  Wt 175 lb (79.379 kg)  BMI 22.17 kg/m2  SpO2 94% Physical Exam  Nursing note and vitals reviewed. Constitutional: He is oriented to  person, place, and time. He appears well-developed and well-nourished. No distress.  HENT:  Head: Normocephalic and atraumatic.  Mouth/Throat: Oropharynx is clear and moist.  Eyes: EOM are normal. Pupils are equal, round, and reactive to light.  Neck: Normal range of motion. Neck supple.  Cardiovascular: Normal rate and regular rhythm.   Pulmonary/Chest: Effort normal and breath sounds normal. No respiratory distress. He has no wheezes. He has no rales.  Abdominal: Soft. Bowel sounds are normal.  Musculoskeletal: Normal range of motion. He exhibits no edema and no tenderness.   Neurological: He is alert and oriented to person, place, and time.  Moves all extremities without deficit. Sensation is intact.  Skin: Skin is warm and dry. No rash noted. No erythema.  Psychiatric:  No suicidal ideation or homicidal ideation. Answering questions appropriately.    ED Course  Procedures (including critical care time) Labs Review Labs Reviewed  CBC WITH DIFFERENTIAL  COMPREHENSIVE METABOLIC PANEL  ETHANOL  URINE RAPID DRUG SCREEN (HOSP PERFORMED)    Imaging Review No results found.   EKG Interpretation None     DIAGNOSTIC STUDIES: Oxygen Saturation is 94% on room air, adequate by my interpretation.    COORDINATION OF CARE:    MDM   Final diagnoses:  None    I personally performed the services described in this documentation, which was scribed in my presence. The recorded information has been reviewed and is accurate.  Patient is medically cleared. Evaluated by [facility]. Meets inpatient requirement for detox. Pending open bed   Loren Racer, MD 06/23/14 [phone]

## 2014-06-23 NOTE — ED Notes (Signed)
Telepsych performed.  

## 2014-06-23 NOTE — ED Notes (Signed)
Patient resting in a position of comfort. Sprite given at patient request. No other needs voiced at this time.

## 2014-06-23 NOTE — BH Assessment (Signed)
Faxed to [facility] for review.  Clista Bernhardt, [facility] Triage Specialist 06/23/2014 5:50 AM

## 2014-06-23 NOTE — ED Notes (Signed)
RN called [facility].  [facility] nurse is still reviewing pt's evaluation.

## 2014-06-23 NOTE — BH Assessment (Signed)
TA complete.   Relayed results of assessment to Dr. Ranae Palms, EDP who is in agreement with trying to seek placement for pt at [facility] or [facility].   Pt meets inpt detox criteria. There are beds available at [facility]. [facility] to seek placement.   Informed RN of plan to seek treatment.   Clista Bernhardt, [facility] Triage Specialist 06/23/2014 4:25 AM

## 2014-06-23 NOTE — BH Assessment (Signed)
Reviewed current EDP notes and notes from prior [facility] admission in 2014 prior to assessment.   Request TA equipment be placed with pt.   TA to commence shortly.   Clista Bernhardt, [facility] Triage Specialist 06/23/2014 3:51 AM
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
## 2014-06-24 NOTE — BH Assessment (Signed)
Tim [name] at [facility] is reviewing the referral.

## 2014-06-24 NOTE — ED Notes (Signed)
Spoke with Ava at [employer], pt needs a taxi voucher to get home. Spoke with [facility], was told to call Ambrose Mantle with transport pt home in an hour.

## 2014-06-24 NOTE — ED Notes (Signed)
Patient given discharge instruction, verbalized understand. Patient ambulatory out of the department.  

## 2014-06-24 NOTE — BH Assessment (Signed)
Per Angie at [facility], there are no [facility] beds available for patient at this time, but advised to call Melissa back in the AM for an update.  -Dossie Arbour, MA  Disposition MHT

## 2014-06-24 NOTE — ED Notes (Signed)
Behavioral health called spoke with MD. Pt denies any SI or HI, pt can be treated outpatient.

## 2014-06-24 NOTE — BH Assessment (Signed)
Per Lurena Joiner (RN) patient denies SI/HI/Psychosis.  Per Renata Caprice, NP patient can be discharged with outpatient substance abuse and mental health resources.    Writer informed Dr. Lynelle Doctor of the patients disposition.

## 2014-06-24 NOTE — BH Assessment (Addendum)
Writer informed Darel Hong at [facility] of the patients disposition (referred to [facility]).

## 2014-06-24 NOTE — ED Provider Notes (Signed)
Pt denies SI or HI.  He was assessed at [facility].  He does not meet inpatient criteria.  Pt is stable for dc.  Pt will be referred for outpatient treatment.  I personally discussed this plan with the patient.  He is primarily concerned with finding a way home.  Linwood Dibbles, MD 06/24/14 [phone]

## 2014-06-24 NOTE — ED Notes (Signed)
Visitors at the bedside.

## 2014-06-24 NOTE — ED Notes (Signed)
Patient ambulatory to restroom with steady gait, clean catch instructions given and advised pt to bring specimen back to room as well.  Gave pt sprite

## 2014-06-24 NOTE — Discharge Instructions (Signed)
Behavioral Health Resources in the Community ° °Intensive Outpatient Programs: °[facility]      °[address] °[place], [place] °[phone] °Both a day and evening program °      °[facility]     °[address]        °[place], [place] [address] °[phone]        ° °[facility] °[address] °[place] °336-882-2125 ° °[facility] °ACCESS LINE: 1-800-853-5163 or 336-641-4981 °[address] °[place], [place] [address] °[email or web address].[email or web address]/adult.htm ° °Mobile Crisis Teams:         °                               °[facility]         °Mobile Crisis Care Unit °1-877-626-1772       °      °Assertive °[facility] °[address] [place] °[phone] °                                        °[facility]vention[facility] °Sharon DeEsch °[address] °[place] °336-554-5454 ° °Self-Help/Support Groups: °[facility] Variety of support groups °373-1402 (call for more info) ° °Narcotics Anonymous (NA) °[facility] °[address] °[place] - 2 meetings at this location ° °Residential Treatment Programs:  °[facility]      °[address]        °[place]       °866-801-8205        ° °[facility] °[address] °[place], [place]  [address] °[phone] ° °[facility]  °[address] °[place], [place] [address] °336-845-3988 °Admissions: 8am-3pm M-F ° °[facility]     °[address]        °[place], [place] [address]       °336-841-1104        ° °[facility] °[address] °[place], [place] °336-379-7146 ° °[facility] °[address] °[place], [place] °336-285-9073 ° °[facility]      °[address]     °[place], [place]       °852-3033        ° °[facility]        °[address] °[place], [place] °877-615-2722 or 336-784-9470 ° °[facility] ([facility]  °[address] °[place], [place] °336-227-7417 ° °[facility]                                               °[address] °[place]  °[phone] ° °[facility] Resources: °[facility]- 1-888-581-9988              ° °General Therapy                                                °Julie Brannon, PhD        °  [address]                                       °[place], [place] [address]         °336-349-5553   °Insurance ° °[facility]   °[address] °[place], [place] [address] °336-349-4454 ° °[facility] °[address] [place], [place] [address] °336-342-8316 °Insurance/Medicaid/sponsorship through [facility] ° °[facility]                                              °[address]                                        °[place], [place] [address]    °Therapy/tele-psych/case         °336-342-8316        °  °[facility] °[address]  ° [place], [place]  [address]  °Adolescent/group home/case management °336-349-2233  °                                         °Julia Brannon PhD       °General therapy       °Insurance   °[phone]        ° °Dr. Arfeen °Insurance °336- 349-4544 °M-F ° °[facility] °Medicaid, sponsorship °336-227-7417 ° ° ° °

## 2014-06-24 NOTE — ED Notes (Signed)
Patient ambulatory to shower room to clean self. Patient provided paper scrubs for clothing change. Patient then ambulatory to restroom. Steady gait, no deficits noted. Bedside cleaning done at this time, and full bed linen change completed. Patient is A&OX4. No needs voiced at this time.

## 2014-06-24 NOTE — ED Notes (Signed)
[facility] here for transport. 

## 2014-07-04 ENCOUNTER — Ambulatory Visit: Payer: Self-pay

## 2014-08-04 ENCOUNTER — Ambulatory Visit: Payer: Self-pay

## 2014-09-01 ENCOUNTER — Ambulatory Visit: Payer: Self-pay | Attending: Family Medicine

## 2014-10-15 ENCOUNTER — Emergency Department (HOSPITAL_COMMUNITY): Payer: Self-pay

## 2014-10-15 ENCOUNTER — Emergency Department (HOSPITAL_COMMUNITY)
Admission: EM | Admit: 2014-10-15 | Discharge: 2014-10-15 | Disposition: A | Discharge status: Home / Self Care | Payer: Self-pay | Attending: Emergency Medicine | Admitting: Emergency Medicine

## 2014-10-15 ENCOUNTER — Encounter (HOSPITAL_COMMUNITY): Payer: Self-pay | Admitting: Emergency Medicine

## 2014-10-15 DIAGNOSIS — R0781 Pleurodynia: Secondary | ICD-10-CM

## 2014-10-15 DIAGNOSIS — F329 Major depressive disorder, single episode, unspecified: Secondary | ICD-10-CM | POA: Insufficient documentation

## 2014-10-15 DIAGNOSIS — Z8739 Personal history of other diseases of the musculoskeletal system and connective tissue: Secondary | ICD-10-CM | POA: Insufficient documentation

## 2014-10-15 DIAGNOSIS — J449 Chronic obstructive pulmonary disease, unspecified: Secondary | ICD-10-CM | POA: Insufficient documentation

## 2014-10-15 DIAGNOSIS — Z79899 Other long term (current) drug therapy: Secondary | ICD-10-CM | POA: Insufficient documentation

## 2014-10-15 DIAGNOSIS — F419 Anxiety disorder, unspecified: Secondary | ICD-10-CM | POA: Insufficient documentation

## 2014-10-15 DIAGNOSIS — Z8619 Personal history of other infectious and parasitic diseases: Secondary | ICD-10-CM | POA: Insufficient documentation

## 2014-10-15 DIAGNOSIS — S29091A Other injury of muscle and tendon of front wall of thorax, initial encounter: Secondary | ICD-10-CM | POA: Insufficient documentation

## 2014-10-15 DIAGNOSIS — Y9389 Activity, other specified: Secondary | ICD-10-CM | POA: Insufficient documentation

## 2014-10-15 DIAGNOSIS — F1210 Cannabis abuse, uncomplicated: Secondary | ICD-10-CM | POA: Insufficient documentation

## 2014-10-15 DIAGNOSIS — Y998 Other external cause status: Secondary | ICD-10-CM | POA: Insufficient documentation

## 2014-10-15 DIAGNOSIS — Z720 Tobacco use: Secondary | ICD-10-CM | POA: Insufficient documentation

## 2014-10-15 DIAGNOSIS — Y9289 Other specified places as the place of occurrence of the external cause: Secondary | ICD-10-CM | POA: Insufficient documentation

## 2014-10-15 LAB — CBC
HCT: 41.1 % (ref 39.0–52.0)
HEMOGLOBIN: 13.5 g/dL (ref 13.0–17.0)
MCH: 30.6 pg (ref 26.0–34.0)
MCHC: 32.8 g/dL (ref 30.0–36.0)
MCV: 93.2 fL (ref 78.0–100.0)
PLATELETS: 236 10*3/uL (ref 150–400)
RBC: 4.41 MIL/uL (ref 4.22–5.81)
RDW: 13.1 % (ref 11.5–15.5)
WBC: 7.2 10*3/uL (ref 4.0–10.5)

## 2014-10-15 LAB — COMPREHENSIVE METABOLIC PANEL
ALBUMIN: 4.2 g/dL (ref 3.5–5.2)
ALK PHOS: 65 U/L (ref 39–117)
ALT: 21 U/L (ref 0–53)
AST: 31 U/L (ref 0–37)
Anion gap: 5 (ref 5–15)
BILIRUBIN TOTAL: 0.7 mg/dL (ref 0.3–1.2)
BUN: 12 mg/dL (ref 6–23)
CHLORIDE: 105 meq/L (ref 96–112)
CO2: 24 mmol/L (ref 19–32)
Calcium: 9.2 mg/dL (ref 8.4–10.5)
Creatinine, Ser: 1.04 mg/dL (ref 0.50–1.35)
GFR calc Af Amer: >90 mL/min (ref >90)
GFR, EST NON AFRICAN AMERICAN: 78 mL/min — AB (ref >90)
Glucose, Bld: 69 mg/dL — ABNORMAL LOW (ref 70–99)
POTASSIUM: 3.6 mmol/L (ref 3.5–5.1)
Sodium: 134 mmol/L — ABNORMAL LOW (ref 135–145)
Total Protein: 6.8 g/dL (ref 6.0–8.3)

## 2014-10-15 LAB — RAPID URINE DRUG SCREEN, HOSP PERFORMED
AMPHETAMINES: NOT DETECTED
Barbiturates: NOT DETECTED
Benzodiazepines: NOT DETECTED
Cocaine: NOT DETECTED
Opiates: NOT DETECTED
Tetrahydrocannabinol: POSITIVE — AB

## 2014-10-15 LAB — ETHANOL: Alcohol, Ethyl (B): 178 mg/dL — ABNORMAL HIGH (ref 0–9)

## 2014-10-15 LAB — ACETAMINOPHEN LEVEL

## 2014-10-15 LAB — SALICYLATE LEVEL: Salicylate Lvl: <4 mg/dL (ref 2.8–20.0)
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
## 2014-10-15 NOTE — ED Notes (Signed)
When walking into room, pt resting quietly.
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
## 2014-10-15 NOTE — ED Notes (Signed)
No bruising to the left rib area where he is complaining of pain.

## 2014-10-15 NOTE — ED Notes (Signed)
Patient resting quietly with eyes closed. Appears in no distress. Observed rise and fall of chest.

## 2014-10-15 NOTE — Discharge Instructions (Signed)

## 2015-02-13 ENCOUNTER — Ambulatory Visit (HOSPITAL_COMMUNITY)
Admission: AD | Admit: 2015-02-13 | Discharge: 2015-02-13 | Disposition: A | Discharge status: Home / Self Care | Payer: Federal, State, Local not specified - Other | Attending: Psychiatry | Admitting: Psychiatry

## 2015-02-13 ENCOUNTER — Encounter (HOSPITAL_COMMUNITY): Payer: Self-pay

## 2015-02-13 ENCOUNTER — Emergency Department (HOSPITAL_COMMUNITY)
Admission: EM | Admit: 2015-02-13 | Discharge: 2015-02-13 | Discharge status: Against Medical Advice | Payer: Self-pay | Attending: Emergency Medicine | Admitting: Emergency Medicine

## 2015-02-13 DIAGNOSIS — F419 Anxiety disorder, unspecified: Secondary | ICD-10-CM | POA: Insufficient documentation

## 2015-02-13 DIAGNOSIS — Z8619 Personal history of other infectious and parasitic diseases: Secondary | ICD-10-CM | POA: Insufficient documentation

## 2015-02-13 DIAGNOSIS — F329 Major depressive disorder, single episode, unspecified: Secondary | ICD-10-CM | POA: Insufficient documentation

## 2015-02-13 DIAGNOSIS — Z5900 Homelessness unspecified: Secondary | ICD-10-CM

## 2015-02-13 DIAGNOSIS — J449 Chronic obstructive pulmonary disease, unspecified: Secondary | ICD-10-CM | POA: Insufficient documentation

## 2015-02-13 DIAGNOSIS — Z8739 Personal history of other diseases of the musculoskeletal system and connective tissue: Secondary | ICD-10-CM | POA: Insufficient documentation

## 2015-02-13 DIAGNOSIS — F1010 Alcohol abuse, uncomplicated: Secondary | ICD-10-CM | POA: Insufficient documentation

## 2015-02-13 DIAGNOSIS — Z720 Tobacco use: Secondary | ICD-10-CM | POA: Insufficient documentation

## 2015-02-13 DIAGNOSIS — Z79899 Other long term (current) drug therapy: Secondary | ICD-10-CM | POA: Insufficient documentation

## 2015-02-13 NOTE — ED Notes (Addendum)
Patient states he has been binge drinking since January and wants to go through detox. Patient deines SI/HI patient states he has had multiple attempts at detox that failed. Patient states he has a few job opportunities coming up and he now has transportation and wants to be sober so he can work. Patients shoes, socks, cigarettes, lighter, wallet, tissue, comb, and screw driver placed in personal belongings bag at nurses station

## 2015-02-13 NOTE — BH Assessment (Signed)
Tele Assessment Note   Tim [name] is a 59 y.o. male who presents as a walk in to [facility], requesting alcohol detox.  Pt was d/c from [facility] for alcohol detox and walked to [facility], stating that he had no place to go.  He is homeless. Pt says he drinks 3-40's daily and last drank 4-5 40's today.  Pt denies w/d sxs, no seizures/blackouts.  Pt was given resources for detox and d/c with MSE completed.   Axis I: Alcohol Use D/O, Severe  Axis II: Deferred Axis III:  Past Medical History  Diagnosis Date  . Mental health disorder   . Scoliosis   . Anxiety   . Depression   . Hep C w/o coma, chronic   . COPD (chronic obstructive pulmonary disease)    Axis IV: economic problems, housing problems, occupational problems, other psychosocial or environmental problems, problems related to social environment and problems with primary support group Axis V: 51-60 moderate symptoms  Past Medical History:  Past Medical History  Diagnosis Date  . Mental health disorder   . Scoliosis   . Anxiety   . Depression   . Hep C w/o coma, chronic   . COPD (chronic obstructive pulmonary disease)     Past Surgical History  Procedure Laterality Date  . Thyroid surgery    . Skin cancer excision      Family History: No family history on file.  Social History:  reports that he has been smoking Cigarettes.  He has a 20 pack-year smoking history. He does not have any smokeless tobacco history on file. He reports that he drinks alcohol. He reports that he uses illicit drugs (Cocaine and Marijuana).  Additional Social History:  Alcohol / Drug Use Pain Medications: See MAR  Prescriptions: See MAR  Over the Counter: See MAR  History of alcohol / drug use?: Yes Longest period of sobriety (when/how long): Only when in detox  Negative Consequences of Use: Work / Programmer, multimedia, Copywriter, advertising relationships, Surveyor, quantity Withdrawal Symptoms: Other (Comment) (No w/d sxs ) Substance #1 Name of Substance 1: Alcohol  1 - Age of First Use:  12 YOM  1 - Amount (size/oz): 3-40's  1 - Frequency: Daily  1 - Duration: On-going  1 - Last Use / Amount: 02/13/15  CIWA: CIWA-Ar BP: (!) 143/117 mmHg Pulse Rate: 76 COWS:    PATIENT STRENGTHS: (choose at least two) Motivation for treatment/growth  Allergies: No Known Allergies  Home Medications:  (Not in a hospital admission)  OB/GYN Status:  No LMP for male patient.  General Assessment Data Location of Assessment: [facility] Is this a Tele or Face-to-Face Assessment?: Face-to-Face Is this an Initial Assessment or a Re-assessment for this encounter?: Initial Assessment Living Arrangements: Other (Comment) (Homeless ) Can pt return to current living arrangement?: Yes Admission Status: Voluntary Is patient capable of signing voluntary admission?: Yes Transfer from: Home Referral Source: Self/Family/Friend  Medical Screening Exam [facility] Walk-in ONLY) Medical Exam completed: No Reason for MSE not completed: Patient Refused  [facility] Crisis Care Plan Living Arrangements: Other (Comment) (Homeless ) Name of Psychiatrist: None  Name of Therapist: None   Education Status Is patient currently in school?: No Current Grade: None  Highest grade of school patient has completed: None  Name of school: None  Contact person: None   Risk to self with the past 6 months Suicidal Ideation: No Suicidal Intent: No Is patient at risk for suicide?: No Suicidal Plan?: No Access to Means: No What has been  your use of drugs/alcohol within the last 12 months?: Abusing: alcohol  Previous Attempts/Gestures: No How many times?: 0 Other Self Harm Risks: None  Triggers for Past Attempts: None known Intentional Self Injurious Behavior: None Family Suicide History: No Recent stressful life event(s): Other (Comment), Financial Problems, Loss (Comment) (Chronic SA, Homelessness) Persecutory voices/beliefs?: No Depression: Yes Depression Symptoms: Loss of interest in usual  pleasures Substance abuse history and/or treatment for substance abuse?: Yes Suicide prevention information given to non-admitted patients: Not applicable  Risk to Others within the past 6 months Homicidal Ideation: No Thoughts of Harm to Others: No Current Homicidal Intent: No Current Homicidal Plan: No Access to Homicidal Means: No Identified Victim: None  History of harm to others?: No Assessment of Violence: None Noted Violent Behavior Description: None  Does patient have access to weapons?: No Criminal Charges Pending?: No Does patient have a court date: No  Psychosis Hallucinations: None noted Delusions: None noted  Mental Status Report Appearance/Hygiene: Disheveled, Poor hygiene Eye Contact: Good Motor Activity: Unremarkable Speech: Slurred Level of Consciousness: Alert Mood: Other (Comment) (Appropriate ) Affect: Appropriate to circumstance Anxiety Level: None Thought Processes: Coherent, Relevant Judgement: Unimpaired Orientation: Person, Place, Time, Situation Obsessive Compulsive Thoughts/Behaviors: None  Cognitive Functioning Concentration: Normal Memory: Recent Intact, Remote Intact IQ: Average Insight: Fair Impulse Control: Good Appetite: Good Weight Loss: 0 Weight Gain: 0 Sleep: No Change Total Hours of Sleep: 4 Vegetative Symptoms: None  ADLScreening [facility]) Patient's cognitive ability adequate to safely complete daily activities?: Yes Patient able to express need for assistance with ADLs?: Yes Independently performs ADLs?: Yes (appropriate for developmental age)  Prior Inpatient Therapy Prior Inpatient Therapy: Yes Prior Therapy Dates: 2006,2011,2012,2013,2014 [facility]  Reason for Treatment: Detox  Prior Outpatient Therapy Prior Outpatient Therapy: No Prior Therapy Dates: None  Prior Therapy Facilty/Provider(s): None  Reason for Treatment: None   ADL Screening (condition at time  of admission) Patient's cognitive ability adequate to safely complete daily activities?: Yes Is the patient deaf or have difficulty hearing?: No Does the patient have difficulty seeing, even when wearing glasses/contacts?: No Does the patient have difficulty concentrating, remembering, or making decisions?: No Patient able to express need for assistance with ADLs?: Yes Does the patient have difficulty dressing or bathing?: No Independently performs ADLs?: Yes (appropriate for developmental age) Does the patient have difficulty walking or climbing stairs?: No Weakness of Legs: None Weakness of Arms/Hands: None  Home Assistive Devices/Equipment Home Assistive Devices/Equipment: None  Therapy Consults (therapy consults require a physician order) PT Evaluation Needed: No OT Evalulation Needed: No SLP Evaluation Needed: No Abuse/Neglect Assessment (Assessment to be complete while patient is alone) Physical Abuse: Denies Verbal Abuse: Denies Sexual Abuse: Denies Exploitation of patient/patient's resources: Denies Self-Neglect: Denies Values / Beliefs Cultural Requests During Hospitalization: None Spiritual Requests During Hospitalization: None Consults Spiritual Care Consult Needed: No Social Work Consult Needed: No Merchant navy officerAdvance Directives (For Healthcare) Does patient have an advance directive?: No    Additional Information 1:1 In Past 12 Months?: No CIRT Risk: No Elopement Risk: No Does patient have medical clearance?: Yes     Disposition:  Disposition Disposition of Patient: Other dispositions, Outpatient treatment (Pt provided referrals and d/c ) Type of outpatient treatment: Adult (Pt provided referrals and d/c ) Other disposition(s): Information only (Pt provided referrals and d/c)  Murrell ReddenSimmons, Kevina Piloto C 02/13/2015 4:40 AM

## 2015-02-13 NOTE — ED Notes (Signed)
Blanket and water cup provided at patient request.
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
## 2015-02-13 NOTE — ED Notes (Signed)
Patient resting eyes closed, even rise and fall of chest. NAD noted at this time.

## 2015-02-13 NOTE — ED Notes (Signed)
Pt presents with c/o detox from alcohol. Pt reports he drinks every day and his last drink was just a few minutes ago. Pt denies SI/HI.

## 2015-03-09 ENCOUNTER — Encounter (HOSPITAL_COMMUNITY): Payer: Self-pay | Admitting: Emergency Medicine

## 2015-03-09 ENCOUNTER — Emergency Department (HOSPITAL_COMMUNITY): Payer: Self-pay

## 2015-03-09 ENCOUNTER — Emergency Department (HOSPITAL_COMMUNITY)
Admission: EM | Admit: 2015-03-09 | Discharge: 2015-03-09 | Disposition: A | Discharge status: Home / Self Care | Payer: Self-pay | Attending: Emergency Medicine | Admitting: Emergency Medicine

## 2015-03-09 DIAGNOSIS — Z5900 Homelessness unspecified: Secondary | ICD-10-CM

## 2015-03-09 DIAGNOSIS — R059 Cough, unspecified: Secondary | ICD-10-CM

## 2015-03-09 DIAGNOSIS — R05 Cough: Secondary | ICD-10-CM | POA: Insufficient documentation

## 2015-03-09 DIAGNOSIS — F419 Anxiety disorder, unspecified: Secondary | ICD-10-CM | POA: Insufficient documentation

## 2015-03-09 DIAGNOSIS — J449 Chronic obstructive pulmonary disease, unspecified: Secondary | ICD-10-CM | POA: Insufficient documentation

## 2015-03-09 DIAGNOSIS — M419 Scoliosis, unspecified: Secondary | ICD-10-CM | POA: Insufficient documentation

## 2015-03-09 DIAGNOSIS — F1010 Alcohol abuse, uncomplicated: Secondary | ICD-10-CM | POA: Insufficient documentation

## 2015-03-09 DIAGNOSIS — F329 Major depressive disorder, single episode, unspecified: Secondary | ICD-10-CM | POA: Insufficient documentation

## 2015-03-09 DIAGNOSIS — Z720 Tobacco use: Secondary | ICD-10-CM | POA: Insufficient documentation

## 2015-03-09 DIAGNOSIS — Z8619 Personal history of other infectious and parasitic diseases: Secondary | ICD-10-CM | POA: Insufficient documentation

## 2015-03-09 DIAGNOSIS — R4781 Slurred speech: Secondary | ICD-10-CM | POA: Insufficient documentation

## 2015-03-09 DIAGNOSIS — Z79899 Other long term (current) drug therapy: Secondary | ICD-10-CM | POA: Insufficient documentation

## 2015-03-09 HISTORY — DX: Alcohol dependence, uncomplicated: F10.20

## 2015-03-09 LAB — CBC WITH DIFFERENTIAL/PLATELET
Basophils Absolute: 0 10*3/uL (ref 0.0–0.1)
Basophils Relative: 1 % (ref 0–1)
Eosinophils Absolute: 0.2 10*3/uL (ref 0.0–0.7)
Eosinophils Relative: 4 % (ref 0–5)
HCT: 43.1 % (ref 39.0–52.0)
Hemoglobin: 14.9 g/dL (ref 13.0–17.0)
Lymphocytes Relative: 35 % (ref 12–46)
Lymphs Abs: 2.2 10*3/uL (ref 0.7–4.0)
MCH: 31 pg (ref 26.0–34.0)
MCHC: 34.6 g/dL (ref 30.0–36.0)
MCV: 89.8 fL (ref 78.0–100.0)
MONOS PCT: 5 % (ref 3–12)
Monocytes Absolute: 0.3 10*3/uL (ref 0.1–1.0)
NEUTROS PCT: 55 % (ref 43–77)
Neutro Abs: 3.4 10*3/uL (ref 1.7–7.7)
Platelets: 240 10*3/uL (ref 150–400)
RBC: 4.8 MIL/uL (ref 4.22–5.81)
RDW: 13.3 % (ref 11.5–15.5)
WBC: 6.2 10*3/uL (ref 4.0–10.5)

## 2015-03-09 LAB — RAPID URINE DRUG SCREEN, HOSP PERFORMED
AMPHETAMINES: NOT DETECTED
BARBITURATES: NOT DETECTED
BENZODIAZEPINES: NOT DETECTED
Cocaine: NOT DETECTED
Opiates: NOT DETECTED
TETRAHYDROCANNABINOL: NOT DETECTED

## 2015-03-09 LAB — COMPREHENSIVE METABOLIC PANEL
ALT: 21 U/L (ref 17–63)
AST: 26 U/L (ref 15–41)
Albumin: 4.1 g/dL (ref 3.5–5.0)
Alkaline Phosphatase: 67 U/L (ref 38–126)
Anion gap: 11 (ref 5–15)
BILIRUBIN TOTAL: 0.4 mg/dL (ref 0.3–1.2)
BUN: 12 mg/dL (ref 6–20)
CO2: 24 mmol/L (ref 22–32)
Calcium: 10.3 mg/dL (ref 8.9–10.3)
Chloride: 105 mmol/L (ref 101–111)
Creatinine, Ser: 1.05 mg/dL (ref 0.61–1.24)
GFR calc Af Amer: >60 mL/min (ref >60)
GFR calc non Af Amer: >60 mL/min (ref >60)
Glucose, Bld: 85 mg/dL (ref 65–99)
Potassium: 4.2 mmol/L (ref 3.5–5.1)
SODIUM: 140 mmol/L (ref 135–145)
TOTAL PROTEIN: 6.8 g/dL (ref 6.5–8.1)

## 2015-03-09 LAB — ETHANOL: Alcohol, Ethyl (B): 168 mg/dL — ABNORMAL HIGH (ref <5)

## 2015-03-09 MED ORDER — ALBUTEROL SULFATE HFA 108 (90 BASE) MCG/ACT IN AERS
2.0000 | INHALATION_SPRAY | Freq: Once | RESPIRATORY_TRACT | Status: AC
  Administered 2015-03-09: 2 via RESPIRATORY_TRACT
  Filled 2015-03-09: qty 6.7

## 2015-03-09 NOTE — ED Provider Notes (Signed)
CSN: [id number]     Arrival date & time 03/09/15  1911 History  This chart was scribed for Tim Emery, PA, working with Elwin Mocha, MD by Lyndel Safe, ED Scribe. This paitent was seen in room TR08C/TR08C and the patient's care was started at 8:38 PM.     Chief Complaint  Patient presents with  . Alcohol Problem    The history is provided by the patient. No language interpreter was used.    HPI Comments: Tim [name] is a 59 y.o. male, with a past medical history of COPD and hepatitis C, who presents to the Emergency Department complaining of an alcohol problem, seeking detox and alcohol treatment. Pt reports that he last consumed alcohol at 6pm this evening  He drinks everyday and upon not consuming alcohol he gets shaky, nervous, and agitated.  His primary reason for seeking treatment today is he "wants to make something happen in his life." He states that he is homeless and involved with [employer] - his next appointment is June 2nd.  Pt notes associated coughing nausea and night sweats. He notes that he has had the taste of blood in his mouth the past couple of nights but he has not experienced hemoptysis.  Pt also notes that he occasionally uses marijuana but no other illegal substances. Patient is a current 0.5 pack/day smoker.  He denies SI/HI, CP, SOB, HA, vomiting, constipation, alcohol withdrawal-related seizures, or anuria.    Past Medical History  Diagnosis Date  . Mental health disorder   . Scoliosis   . Anxiety   . Depression   . Hep C w/o coma, chronic   . COPD (chronic obstructive pulmonary disease)   . Alcoholism    Past Surgical History  Procedure Laterality Date  . Thyroid surgery    . Skin cancer excision     No family history on file. History  Substance Use Topics  . Smoking status: Current Every Day Smoker -- 0.50 packs/day for 40 years    Types: Cigarettes  . Smokeless tobacco: Not on file  . Alcohol Use: Yes    Review of Systems  10 systems  reviewed and found to be negative, except as noted in the HPI.   Allergies  Review of patient's allergies indicates no known allergies.  Home Medications   Prior to Admission medications   Medication Sig Start Date End Date Taking? Authorizing Provider  FLUoxetine (PROZAC) 40 MG capsule Take 40 mg by mouth daily.      Historical Provider, MD  gabapentin (NEURONTIN) 100 MG capsule Take 1 capsule (100 mg total) by mouth 3 (three) times daily. For anxiety symptoms 04/02/13   Sanjuana Kava, NP  ibuprofen (ADVIL,MOTRIN) 200 MG tablet Take 400 mg by mouth every 6 (six) hours as needed (pain).    Historical Provider, MD  traZODone (DESYREL) 50 MG tablet Take 50 mg by mouth at bedtime as needed for sleep.    Historical Provider, MD   BP 96/56 mmHg  Pulse 66  Temp(Src) 97.8 F (36.6 C) (Oral)  Resp 20  Ht 6' 2.5" (1.892 m)  Wt 181 lb (82.101 kg)  BMI 22.94 kg/m2  SpO2 91% Physical Exam  Constitutional: He is oriented to person, place, and time. He appears well-developed and well-nourished. No distress.  HENT:  Head: Normocephalic and atraumatic.  Mouth/Throat: Oropharynx is clear and moist.  Eyes: Conjunctivae and EOM are normal. Pupils are equal, round, and reactive to light. Right eye exhibits no discharge. Left eye exhibits  no discharge.  Neck: Normal range of motion. Neck supple.  Cardiovascular: Normal rate, regular rhythm and intact distal pulses.   Pulmonary/Chest: Effort normal and breath sounds normal. No stridor. No respiratory distress.  Abdominal: Soft. Bowel sounds are normal. There is no tenderness.  Musculoskeletal: Normal range of motion.  Neurological: He is alert and oriented to person, place, and time. Coordination normal.  Slightly slurred speech, oriented 3, appears well kempt  Skin: Skin is warm and dry. No rash noted. He is not diaphoretic. No erythema.  Psychiatric: He has a normal mood and affect.  Nursing note and vitals reviewed.   ED Course  Procedures   DIAGNOSTIC STUDIES: Oxygen Saturation is 96% on RA, normal by my interpretation.    COORDINATION OF CARE: 8:44 PM Discussed plan to obtain a chest X-ray with pt. Will consult with a Child psychotherapistsocial worker.  Pt agreed to course of treatment.  Labs Review Labs Reviewed  ETHANOL - Abnormal; Notable for the following:    Alcohol, Ethyl (B) 168 (*)    All other components within normal limits  URINE RAPID DRUG SCREEN (HOSP PERFORMED)  CBC WITH DIFFERENTIAL/PLATELET  COMPREHENSIVE METABOLIC PANEL    Imaging Review Dg Chest 2 View  03/09/2015   CLINICAL DATA:  Cough.  EXAM: CHEST  2 VIEW  COMPARISON:  10/15/2014  FINDINGS: Hyperinflation and interstitial coarsening. No edema, effusion, pneumonia, or pneumothorax. Normal heart size and mediastinal contours. Remote healed right-sided rib fractures. No acute osseous findings.  IMPRESSION: COPD without acute superimposed disease.   Electronically Signed   By: Marnee SpringJonathon  Watts M.D.   On: 03/09/2015 21:26     EKG Interpretation None      MDM   Final diagnoses:  Cough  Homeless  Alcohol abuse   Filed Vitals:   03/09/15 1931 03/09/15 2116 03/09/15 2201  BP: 106/67 106/67 96/56  Pulse: 73 73 66  Temp: 98 F (36.7 C)  97.8 F (36.6 C)  TempSrc: Oral  Oral  Resp: 14  20  Height: 6' 2.5" (1.892 m)    Weight: 181 lb (82.101 kg)    SpO2: 96%  91%    Tim LandauJeffrey L Kothari is a pleasant 59 y.o. male presenting with alcohol intoxication, requesting detox. Patient is also homeless. No signs of DTs. Patient is very mildly intoxicated, ambulates with a steady gait. He is alert and oriented 3. 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 Will obtain chest x-ray, patient is saturating well on room air, lung sounds are clear to auscultation. I discussed this case with Burna MortimerWanda, states that the best option for him would be [facility], they take uninsured patients. His blood work shows no acute abnormality. Patient's blood alcohol level is elevated however  he is oriented 3, ambulates with a steady gait, appropriate for discharge.  Evaluation does not show pathology that would require ongoing emergent intervention or inpatient treatment. Pt is hemodynamically stable and mentating appropriately. Discussed findings and plan with patient/guardian, who agrees with care plan. All questions answered. Return precautions discussed and outpatient follow up given.   I personally performed the services described in this documentation, which was scribed in my presence.  The recorded information has been reviewed and is accurate.    Tim Emeryicole Denis Koppel, PA-C 03/09/15 2241  Elwin MochaBlair Walden, MD 03/09/15 [phone]

## 2015-03-09 NOTE — ED Notes (Addendum)
Pt states he wants to stop drinking, his last drink was around 1900 today, reports that he had 4 40oz beers. Denies SI or HI

## 2015-03-09 NOTE — ED Notes (Signed)
Pt. requesting detox for his alcoholism , last drink today , denies hallucinations , no suicidal ideations . Alert and oriented/respirations unlabored . Ambulatory .

## 2015-03-09 NOTE — Discharge Instructions (Signed)
[facility] Address: [address] Donella St[place], [place] [address] [phone]  Do not hesitate to return to the emergency room for any new, worsening or concerning symptoms.  Please obtain primary care using resource guide below. Let them know that you were seen in the emergency room and that they will need to obtain records for further outpatient management.   Alcohol Use Disorder Alcohol use disorder is a mental disorder. It is not a one-time incident of heavy drinking. Alcohol use disorder is the excessive and uncontrollable use of alcohol over time that leads to problems with functioning in one or more areas of daily living. People with this disorder risk harming themselves and others when they drink to excess. Alcohol use disorder also can cause other mental disorders, such as mood and anxiety disorders, and serious physical problems. People with alcohol use disorder often misuse other drugs.  Alcohol use disorder is common and widespread. Some people with this disorder drink alcohol to cope with or escape from negative life events. Others drink to relieve chronic pain or symptoms of mental illness. People with a family history of alcohol use disorder are at higher risk of losing control and using alcohol to excess.  SYMPTOMS  Signs and symptoms of alcohol use disorder may include the following:   Consumption ofalcohol inlarger amounts or over a longer period of time than intended.  Multiple unsuccessful attempts to cutdown or control alcohol use.   A great deal of time spent obtaining alcohol, using alcohol, or recovering from the effects of alcohol (hangover).  A strong desire or urge to use alcohol (cravings).   Continued use of alcohol despite problems at work, school, or home because of alcohol use.   Continued use of alcohol despite problems in relationships because of alcohol use.  Continued use of alcohol in situations when it  is physically hazardous, such as driving a car.  Continued use of alcohol despite awareness of a physical or psychological problem that is likely related to alcohol use. Physical problems related to alcohol use can involve the brain, heart, liver, stomach, and intestines. Psychological problems related to alcohol use include intoxication, depression, anxiety, psychosis, delirium, and dementia.   The need for increased amounts of alcohol to achieve the same desired effect, or a decreased effect from the consumption of the same amount of alcohol (tolerance).  Withdrawal symptoms upon reducing or stopping alcohol use, or alcohol use to reduce or avoid withdrawal symptoms. Withdrawal symptoms include:  Racing heart.  Hand tremor.  Difficulty sleeping.  Nausea.  Vomiting.  Hallucinations.  Restlessness.  Seizures. DIAGNOSIS Alcohol use disorder is diagnosed through an assessment by your health care provider. Your health care provider may start by asking three or four questions to screen for excessive or problematic alcohol use. To confirm a diagnosis of alcohol use disorder, at least two symptoms must be present within a 79-month period. The severity of alcohol use disorder depends on the number of symptoms:  Mild--two or three.  Moderate--four or five.  Severe--six or more. Your health care provider may perform a physical exam or use results from lab tests to see if you have physical problems resulting from alcohol use. Your health care provider may refer you to a mental health professional for evaluation. TREATMENT  Some people with alcohol use disorder are able to reduce their alcohol use to low-risk levels. Some people with alcohol use disorder need to quit drinking alcohol. When necessary, mental health professionals with  specialized training in substance use treatment can help. Your health care provider can help you decide how severe your alcohol use disorder is and what type of  treatment you need. The following forms of treatment are available:   Detoxification. Detoxification involves the use of prescription medicines to prevent alcohol withdrawal symptoms in the first week after quitting. This is important for people with a history of symptoms of withdrawal and for heavy drinkers who are likely to have withdrawal symptoms. Alcohol withdrawal can be dangerous and, in severe cases, cause death. Detoxification is usually provided in a hospital or in-patient substance use treatment facility.  Counseling or talk therapy. Talk therapy is provided by substance use treatment counselors. It addresses the reasons people use alcohol and ways to keep them from drinking again. The goals of talk therapy are to help people with alcohol use disorder find healthy activities and ways to cope with life stress, to identify and avoid triggers for alcohol use, and to handle cravings, which can cause relapse.  Medicines.Different medicines can help treat alcohol use disorder through the following actions:  Decrease alcohol cravings.  Decrease the positive reward response felt from alcohol use.  Produce an uncomfortable physical reaction when alcohol is used (aversion therapy).  Support groups. Support groups are run by people who have quit drinking. They provide emotional support, advice, and guidance. These forms of treatment are often combined. Some people with alcohol use disorder benefit from intensive combination treatment provided by specialized substance use treatment centers. Both inpatient and outpatient treatment programs are available. Document Released: 11/10/2004 Document Revised: 02/17/2014 Document Reviewed: 01/10/2013 [facility] Patient Information 2015 [place], [place]. This information is not intended to replace advice given to you by your health care provider. Make sure you discuss any questions you have with your health care provider.     Emergency Department Resource  Guide 1) Find a Doctor and Pay Out of Pocket Although you won't have to find out who is covered by your insurance plan, it is a good idea to ask around and get recommendations. You will then need to call the office and see if the doctor you have chosen will accept you as a new patient and what types of options they offer for patients who are self-pay. Some doctors offer discounts or will set up payment plans for their patients who do not have insurance, but you will need to ask so you aren't surprised when you get to your appointment.  2) Contact Your Local Health Department Not all health departments have doctors that can see patients for sick visits, but many do, so it is worth a call to see if yours does. If you don't know where your local health department is, you can check in your phone book. The CDC also has a tool to help you locate your state's health department, and many state websites also have listings of all of their local health departments.  3) Find a Walk-in Clinic If your illness is not likely to be very severe or complicated, you may want to try a walk in clinic. These are popping up all over the country in pharmacies, drugstores, and shopping centers. They're usually staffed by nurse practitioners or physician assistants that have been trained to treat common illnesses and complaints. They're usually fairly quick and inexpensive. However, if you have serious medical issues or chronic medical problems, these are probably not your best option.  No Primary Care Doctor: - Call Health Connect at  [phone] - they can  help you locate a primary care doctor that  accepts your insurance, provides certain services, etc. - Physician Referral Service- [phone]  Chronic Pain Problems: Organization         Address  Phone   Notes  [facility]  [phone] Patients need to be referred by their primary care doctor.   Medication Assistance: Organization          Address  Phone   Notes  [facility] Medication [facility] [address] [place] [place], [place] [address] [phone] --Must be a resident of [facility] -- Must have NO insurance coverage whatsoever (no Medicaid/ Medicare, etc.) -- The pt. MUST have a primary care doctor that directs their care regularly and follows them in the community   [facility]  [phone]   [employer]  [phone]    Agencies that provide inexpensive medical care: Organization         Address  Phone   Notes  [facility]  [phone]   [facility]    [phone]   [facility] [address] [place], [place] [address] [phone]   [facility] [address], [place] [phone]   Planned Parenthood    [phone]   [facility]    [phone]   [facility]  [address], [place] Phone:  [phone], Fax:  [phone] Hours of Operation:  9 am - 6 pm, M-F.  Also accepts Medicaid/Medicare and self-pay.  [facility]  [address], [place] Phone: [phone], Fax: [phone]. Hours of Operation:  8:30 am - 5:30 pm, M-F.  Also accepts Medicaid and self-pay.  [facility] [place] [address] [place] Phone: [phone]   [facility] [address] Natasha Bence [place], [place] [phone], Ext. [phone] Mondays & Thursdays: 7-9 AM.  First 15 patients are seen on a first come, first serve basis.    Medicaid-accepting [facility] Providers:  Organization         Address  Phone   Notes  [facility] [address], [place] [phone] Also accepts self-pay patients.  [facility] [address] Laurell Josephs [place], [place]  [phone]   [facility] [address], [place] [phone]   [facility] [address], [place] [phone]   Renaye Rakers [address], [place]   [phone] Only accepts [place] Access [place] patients after they have their name applied to their card.   Self-Pay (no insurance) in [facility]:  Organization         Address  Phone   Notes  Sickle Cell Patients, [facility] [address] [place], [place] [phone]   [facility] [address] [place], [place] [phone]   [facility] [place]  [address], [place] [phone]   [facility]/Dr. Osei-Bonsu  [address], [place] or [address], [place] [phone] Phone number for both [place] and [place] locations is the same.  [facility] [address], [place] [phone]   [facility] [address] [place] or [address] [phone]   [facility] [address] [place] [phone], phone; [phone], fax Sees patients 1st and 3rd Saturday of every month.  Must not qualify for public or private insurance (i.e. Medicaid, Medicare, [place] Health Choice, Veterans' Benefits)  Household income should be no more than 200% of the poverty level The clinic cannot treat you if you are pregnant or think you are pregnant  Sexually transmitted diseases are not treated at the clinic.    Dental Care: Organization         Address  Phone  Notes  [facility] [address] [place], [place] [phone] Accepts children up to age 45 who are enrolled in [place] or [place] Health Choice; pregnant women with a Medicaid card; and children who have applied for Medicaid or [place] Health Choice, but were declined, whose parents can pay a reduced fee at time of service.  [facility] Department of [facility]  [address], [place] [phone] Accepts children up to age 74 who are enrolled in [place] or [place] Health Choice; pregnant women with a Medicaid card; and children who have applied for Medicaid or [place] Health Choice, but were declined, whose parents can pay a reduced fee at time of service.  [facility]  [address] [place], [place] [phone] Patients are seen by appointment only. Walk-ins are not accepted. [facility] will see patients 107 years of age and older. Monday - Tuesday (8am-5pm) Most Wednesdays (8:30-5pm) $30 per visit, cash only  [facility] Adult Dental Access PRO[facility]  [address], [facility] [phone] Patients are seen by appointment only. Walk-ins are not accepted. [facility] will see patients 42 years of age and older. One Wednesday Evening (Monthly: Volunteer Based).  $30 per visit, cash only  [employer]  [phone] for adults; Children under age 55, call Graduate Pediatric Dentistry at [phone]. Children aged 65-14, please call [phone] to request a pediatric application.  Dental services are provided in all areas of dental care including fillings, crowns and bridges, complete and partial dentures, implants, gum treatment, root canals, and extractions. Preventive care is also provided. Treatment is provided to both adults and children. Patients are selected via a lottery and there is often a waiting list.   [facility] [address] [place]  [phone] [email or web address]   [facility] [address] [place], [place] [phone], Ext. [phone] Second and Fourth Thursday of each month, opens at 6:30 AM; Clinic ends at 9 AM.  Patients are seen on a first-come first-served basis, and a limited number are seen during each clinic.   [facility]  [address] Ether Griffins [place], [place] [phone]   Eligibility Requirements You must  have lived in [place], [place], or [place] counties for at least the last three months.   You cannot be eligible for state or federal sponsored [place], including [employer], [place], or [employer].   You generally cannot be eligible for healthcare insurance through your employer.    How to apply: Eligibility screenings are held every Tuesday and Wednesday afternoon from 1:00 pm until 4:00 pm. You do not need an appointment for the interview!  [facility] [address], [place], [place] [phone]   [facility]  [phone]   [facility] Department  [phone]   [facility] Department  [phone]    Behavioral Health Resources in the Community: Intensive Outpatient Programs Organization         Address  Phone  Notes  [facility] Services [address] [place], [place] [phone]   [facility] Outpatient [address] [place], [place] [phone]   [facility] [address] [place], [place]  [phone]   [facility] Mental [facility] [address]  [place], [place] [phone] or [phone]   Substance Abuse Resources Organization         Address  Phone  Notes  [facility] Drug Services  [phone]   [facility]  [phone]   [facility] [place]  [phone]   Floydene Flock  [phone]   Residential & Outpatient Substance Abuse Program  [phone]   Psychological Services Organization         Address  Phone  Notes  [facility] Health  [phone]   [facility] Services  [phone]   [facility] Mental Health [address] [place] [phone] or [phone]    Mobile Crisis Teams Organization         Address  Phone  Notes  [facility], Mobile Crisis Care Unit  [phone]   [facility]  [address] [place], [place] [phone]   Doristine Locks [address] [place] [phone]    Self-Help/Support Groups Organization         Address  Phone             Notes  [facility] [place] - variety of support groups  [phone]- I7437963 Call for more information  Narcotics Anonymous (NA), [facility] [address], [employer] [place]  2 meetings at this location   Statistician         Address  Phone  Notes  [facility] [address],    [place]  [phone]   [facility]  [address], [place] [address], [place], [place] [phone]   [facility] [address] [place], [place] [phone] Admissions: 8am-3pm M-F  [facility] [address],    [place], [place] [phone]   [facility] [address] [place], [place], [place] [phone]   [facility] [address]  [place], [place] [phone]   [facility] - Intensive Outpatient [address] Laurell Josephs [address], [place], [place] [phone]   [facility] (Addiction [facility] [address] [place]  [place], [place] [phone] or [phone]   [facility] ([facility]) [address], [place], [place] [phone] Accepts Medicaid  [facility] [address],  [place] [phone] Substance Abuse/Addiction Treatment   [facility] Organization         Address  Phone  Notes  [facility]  [phone]   Angie Fava, PhD [address] Ervin Knack [place], [place]   [phone] or [phone]   [facility] Behavioral   [address] [place], [place] [phone]   [facility] [address], [place], [place] [phone] Insurance/Medicaid/sponsorship through [employer] and Families [address]                                    [place], [place] (  [phone] Therapy/tele-psych/case  [facility] [address]    [place], [place] [phone]    Dr. Lolly Mustache  [phone]   [facility]  [employer] [facility] Dept. 1) [address] [place] 2) [address] [place] 3)  [address], [place] [phone]  [phone]   [facility] Child Abuse Hotline [phone] or [phone] (After Hours)

## 2015-03-09 NOTE — ED Notes (Signed)
Patient successfully able to ambulate in hallway

## 2015-03-09 NOTE — ED Notes (Signed)
Paper scrubs given to pt. , security notified to wand pt.

## 2015-03-29 ENCOUNTER — Emergency Department (HOSPITAL_COMMUNITY): Payer: Self-pay

## 2015-03-29 ENCOUNTER — Emergency Department (HOSPITAL_COMMUNITY)
Admission: EM | Admit: 2015-03-29 | Discharge: 2015-03-29 | Disposition: A | Discharge status: Home / Self Care | Payer: Self-pay | Attending: Internal Medicine | Admitting: Internal Medicine

## 2015-03-29 ENCOUNTER — Encounter (HOSPITAL_COMMUNITY): Payer: Self-pay | Admitting: Emergency Medicine

## 2015-03-29 DIAGNOSIS — L03115 Cellulitis of right lower limb: Secondary | ICD-10-CM | POA: Insufficient documentation

## 2015-03-29 DIAGNOSIS — Z720 Tobacco use: Secondary | ICD-10-CM | POA: Insufficient documentation

## 2015-03-29 DIAGNOSIS — F329 Major depressive disorder, single episode, unspecified: Secondary | ICD-10-CM | POA: Insufficient documentation

## 2015-03-29 DIAGNOSIS — Z8619 Personal history of other infectious and parasitic diseases: Secondary | ICD-10-CM | POA: Insufficient documentation

## 2015-03-29 DIAGNOSIS — S8991XA Unspecified injury of right lower leg, initial encounter: Secondary | ICD-10-CM | POA: Insufficient documentation

## 2015-03-29 DIAGNOSIS — M419 Scoliosis, unspecified: Secondary | ICD-10-CM | POA: Insufficient documentation

## 2015-03-29 DIAGNOSIS — J449 Chronic obstructive pulmonary disease, unspecified: Secondary | ICD-10-CM | POA: Insufficient documentation

## 2015-03-29 DIAGNOSIS — W228XXA Striking against or struck by other objects, initial encounter: Secondary | ICD-10-CM | POA: Insufficient documentation

## 2015-03-29 DIAGNOSIS — Z79899 Other long term (current) drug therapy: Secondary | ICD-10-CM | POA: Insufficient documentation

## 2015-03-29 DIAGNOSIS — F419 Anxiety disorder, unspecified: Secondary | ICD-10-CM | POA: Insufficient documentation

## 2015-03-29 DIAGNOSIS — F1020 Alcohol dependence, uncomplicated: Secondary | ICD-10-CM

## 2015-03-29 DIAGNOSIS — Y9289 Other specified places as the place of occurrence of the external cause: Secondary | ICD-10-CM | POA: Insufficient documentation

## 2015-03-29 DIAGNOSIS — Y998 Other external cause status: Secondary | ICD-10-CM | POA: Insufficient documentation

## 2015-03-29 DIAGNOSIS — Y9389 Activity, other specified: Secondary | ICD-10-CM | POA: Insufficient documentation

## 2015-03-29 LAB — CBC WITH DIFFERENTIAL/PLATELET
Basophils Absolute: 0 10*3/uL (ref 0.0–0.1)
Basophils Relative: 0 % (ref 0–1)
EOS ABS: 0 10*3/uL (ref 0.0–0.7)
EOS PCT: 0 % (ref 0–5)
HCT: 41.3 % (ref 39.0–52.0)
Hemoglobin: 14.2 g/dL (ref 13.0–17.0)
LYMPHS PCT: 10 % — AB (ref 12–46)
Lymphs Abs: 0.9 10*3/uL (ref 0.7–4.0)
MCH: 32.2 pg (ref 26.0–34.0)
MCHC: 34.4 g/dL (ref 30.0–36.0)
MCV: 93.7 fL (ref 78.0–100.0)
MONO ABS: 0.6 10*3/uL (ref 0.1–1.0)
MONOS PCT: 6 % (ref 3–12)
Neutro Abs: 8.1 10*3/uL — ABNORMAL HIGH (ref 1.7–7.7)
Neutrophils Relative %: 84 % — ABNORMAL HIGH (ref 43–77)
Platelets: 203 10*3/uL (ref 150–400)
RBC: 4.41 MIL/uL (ref 4.22–5.81)
RDW: 14.3 % (ref 11.5–15.5)
WBC: 9.7 10*3/uL (ref 4.0–10.5)

## 2015-03-29 LAB — BASIC METABOLIC PANEL
ANION GAP: 7 (ref 5–15)
BUN: 12 mg/dL (ref 6–20)
CO2: 22 mmol/L (ref 22–32)
CREATININE: 1.11 mg/dL (ref 0.61–1.24)
Calcium: 8.8 mg/dL — ABNORMAL LOW (ref 8.9–10.3)
Chloride: 106 mmol/L (ref 101–111)
GFR calc Af Amer: >60 mL/min (ref >60)
GFR calc non Af Amer: >60 mL/min (ref >60)
Glucose, Bld: 127 mg/dL — ABNORMAL HIGH (ref 65–99)
POTASSIUM: 3.5 mmol/L (ref 3.5–5.1)
SODIUM: 135 mmol/L (ref 135–145)

## 2015-03-29 MED ORDER — SULFAMETHOXAZOLE-TRIMETHOPRIM 800-160 MG PO TABS: 1.0000 | ORAL_TABLET | Freq: Two times a day (BID) | ORAL | Status: AC

## 2015-03-29 MED ORDER — OXYCODONE-ACETAMINOPHEN 5-325 MG PO TABS: 1.0000 | ORAL_TABLET | Freq: Four times a day (QID) | ORAL | Status: DC | PRN

## 2015-03-29 MED ORDER — VANCOMYCIN HCL IN DEXTROSE 750-5 MG/150ML-% IV SOLN
750.0000 mg | Freq: Three times a day (TID) | INTRAVENOUS | Status: DC
  Filled 2015-03-29 (×2): qty 150

## 2015-03-29 MED ORDER — VANCOMYCIN HCL IN DEXTROSE 750-5 MG/150ML-% IV SOLN
750.0000 mg | INTRAVENOUS | Status: AC
  Administered 2015-03-29: 750 mg via INTRAVENOUS
  Filled 2015-03-29: qty 150

## 2015-03-29 MED ORDER — ACETAMINOPHEN 325 MG PO TABS
650.0000 mg | ORAL_TABLET | Freq: Once | ORAL | Status: AC
  Administered 2015-03-29: 650 mg via ORAL
  Filled 2015-03-29: qty 2
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
## 2015-03-29 NOTE — Progress Notes (Addendum)
ANTIBIOTIC CONSULT NOTE - INITIAL  Pharmacy Consult for vancomycin Indication: leg wound  No Known Allergies  Patient Measurements: Height: 6\' 2"  (188 cm) Weight: 171 lb (77.565 kg) IBW/kg (Calculated) : 82.2 Adjusted Body Weight: n/a  Vital Signs: Temp: 98 F (36.7 C) (06/12 1141) Temp Source: Oral (06/12 1141) BP: 134/74 mmHg (06/12 1449) Pulse Rate: 70 (06/12 1449) Intake/Output from previous day:   Intake/Output from this shift:    Labs:  Recent Labs  03/29/15 1333  WBC 9.7  HGB 14.2  PLT 203  CREATININE 1.11   Estimated Creatinine Clearance: 80.6 mL/min (by C-G formula based on Cr of 1.11). No results for input(s): VANCOTROUGH, VANCOPEAK, VANCORANDOM, GENTTROUGH, GENTPEAK, GENTRANDOM, TOBRATROUGH, TOBRAPEAK, TOBRARND, AMIKACINPEAK, AMIKACINTROU, AMIKACIN in the last 72 hours.   Microbiology: No results found for this or any previous visit (from the past 720 hour(s)).  Medical History: Past Medical History  Diagnosis Date  . Mental health disorder   . Scoliosis   . Anxiety   . Depression   . Hep C w/o coma, chronic   . COPD (chronic obstructive pulmonary disease)   . Alcoholism     Assessment: 59 yo male with injury to R lower leg with redness and purulent drainage.  Pharmacy asked to begin empiric coverage with vancomycin.  Scr WNL in 02/2015, current Scr 1.11, est CrCl ~ 80 ml/min.  Goal of Therapy:  Vancomycin trough level 15-20 mcg/ml  Plan:  1. Vancomycin 750 mg IV q 8 hrs. 2. F/u cultures, plan.  Tad Moore, BCPS  Clinical Pharmacist Pager [phone]  03/29/2015 3:00 PM

## 2015-03-29 NOTE — ED Notes (Signed)
Dr. Romeo Apple informed of patients temperature. Dr. Romeo Apple speaking to Dr. Robb Matar about possible admission.
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
## 2015-03-29 NOTE — ED Provider Notes (Addendum)
4:58 PM Accepted care from Dr. Adriana Simas. Dr. Robb Matar to see the patient.  Dr. Robb Matar has seen the patient and recommends Bactrim DS twice a day for 14 days, wet to dry dressings, and [facility] the [facility]. He does not feel that the patient requires admission. He has asked nursing to ask me to write discharge paperwork for the patient. I reiterated Dr. Donato Heinz recommendations. I will provide the pt with the anti-biotic. I informed the patient to return for any fevers, spreading redness, worsening drainage from the wound. He understands and will comply. He states that he is able to get the abx and pain medicines that are provided. I took a picture of the wound for documentation purposes. VS remain stable. Pt well appearing.   Pt found to have fever upon d/c VS. Treated w/ tylenol. He does not meet SIRS criteria. I discussed the case with Dr. Robb Matar again. He is still okay with the patient going home. The patient would prefer to go home and does not want to stay in the hospital.      Clinical Impression 1. Cellulitis of right lower extremity      Purvis Sheffield, MD 03/29/15 1703  Purvis Sheffield, MD 03/29/15 1731

## 2015-03-29 NOTE — ED Notes (Signed)
Pt returned from xray

## 2015-03-29 NOTE — ED Notes (Signed)
Patient transported to X-ray 

## 2015-03-29 NOTE — ED Notes (Signed)
Pt ambulated to the bathroom with ease 

## 2015-04-30 ENCOUNTER — Encounter (HOSPITAL_COMMUNITY): Payer: Self-pay | Admitting: *Deleted

## 2015-04-30 ENCOUNTER — Emergency Department (HOSPITAL_COMMUNITY)
Admission: EM | Admit: 2015-04-30 | Discharge: 2015-04-30 | Disposition: A | Discharge status: Home / Self Care | Payer: Self-pay | Attending: Emergency Medicine | Admitting: Emergency Medicine

## 2015-04-30 DIAGNOSIS — Z79899 Other long term (current) drug therapy: Secondary | ICD-10-CM | POA: Insufficient documentation

## 2015-04-30 DIAGNOSIS — F329 Major depressive disorder, single episode, unspecified: Secondary | ICD-10-CM | POA: Insufficient documentation

## 2015-04-30 DIAGNOSIS — F10239 Alcohol dependence with withdrawal, unspecified: Secondary | ICD-10-CM | POA: Insufficient documentation

## 2015-04-30 DIAGNOSIS — F419 Anxiety disorder, unspecified: Secondary | ICD-10-CM | POA: Insufficient documentation

## 2015-04-30 DIAGNOSIS — F1010 Alcohol abuse, uncomplicated: Secondary | ICD-10-CM

## 2015-04-30 DIAGNOSIS — Z720 Tobacco use: Secondary | ICD-10-CM | POA: Insufficient documentation

## 2015-04-30 DIAGNOSIS — Z8639 Personal history of other endocrine, nutritional and metabolic disease: Secondary | ICD-10-CM | POA: Insufficient documentation

## 2015-04-30 DIAGNOSIS — J449 Chronic obstructive pulmonary disease, unspecified: Secondary | ICD-10-CM | POA: Insufficient documentation

## 2015-04-30 DIAGNOSIS — Z8739 Personal history of other diseases of the musculoskeletal system and connective tissue: Secondary | ICD-10-CM | POA: Insufficient documentation

## 2015-04-30 DIAGNOSIS — Z8619 Personal history of other infectious and parasitic diseases: Secondary | ICD-10-CM | POA: Insufficient documentation

## 2015-04-30 HISTORY — DX: Disorder of thyroid, unspecified: E07.9

## 2015-04-30 MED ORDER — CHLORDIAZEPOXIDE HCL 25 MG PO CAPS: ORAL_CAPSULE | ORAL | Status: DC

## 2015-04-30 NOTE — ED Notes (Addendum)
Pt states he would like help detoxing from alcohol.  States he's been on a 4 day binge.  Pt states he drinks 4 40's of beer per day and he last drank at 12 noon.  Denies SI, and states he doesn't want to live on the street anymore.

## 2015-04-30 NOTE — Discharge Instructions (Signed)
As we discussed, do NOT drink alcohol with the librium, this could cause you to die.   Alcohol Withdrawal Anytime drug use is interfering with normal living activities it has become abuse. This includes problems with family and friends. Psychological dependence has developed when your mind tells you that the drug is needed. This is usually followed by physical dependence when a continuing increase of drugs are required to get the same feeling or "high." This is known as addiction or chemical dependency. A person's risk is much higher if there is a history of chemical dependency in the family. Mild Withdrawal Following Stopping Alcohol, When Addiction or Chemical Dependency Has Developed When a person has developed tolerance to alcohol, any sudden stopping of alcohol can cause uncomfortable physical symptoms. Most of the time these are mild and consist of tremors in the hands and increases in heart rate, breathing, and temperature. Sometimes these symptoms are associated with anxiety, panic attacks, and bad dreams. There may also be stomach upset. Normal sleep patterns are often interrupted with periods of inability to sleep (insomnia). This may last for 6 months. Because of this discomfort, many people choose to continue drinking to get rid of this discomfort and to try to feel normal. Severe Withdrawal with Decreased or No Alcohol Intake, When Addiction or Chemical Dependency Has Developed About five percent of alcoholics will develop signs of severe withdrawal when they stop using alcohol. One sign of this is development of generalized seizures (convulsions). Other signs of this are severe agitation and confusion. This may be associated with believing in things which are not real or seeing things which are not really there (delusions and hallucinations). Vitamin deficiencies are usually present if alcohol intake has been long-term. Treatment for this most often requires hospitalization and close  observation. Addiction can only be helped by stopping use of all chemicals. This is hard but may save your life. With continual alcohol use, possible outcomes are usually loss of self respect and esteem, violence, and death. Addiction cannot be cured but it can be stopped. This often requires outside help and the care of professionals. Treatment centers are listed in the yellow pages under Cocaine, Narcotics, and Alcoholics Anonymous. Most hospitals and clinics can refer you to a specialized care center. It is not necessary for you to go through the uncomfortable symptoms of withdrawal. Your caregiver can provide you with medicines that will help you through this difficult period. Try to avoid situations, friends, or drugs that made it possible for you to keep using alcohol in the past. Learn how to say no. It takes a long period of time to overcome addictions to all drugs, including alcohol. There may be many times when you feel as though you want a drink. After getting rid of the physical addiction and withdrawal, you will have a lessening of the craving which tells you that you need alcohol to feel normal. Call your caregiver if more support is needed. Learn who to talk to in your family and among your friends so that during these periods you can receive outside help. Alcoholics Anonymous (AA) has helped many people over the years. To get further help, contact AA or call your caregiver, counselor, or clergyperson. Al-Anon and Alateen are support groups for friends and family members of an alcoholic. The people who love and care for an alcoholic often need help, too. For information about these organizations, check your phone directory or call a local alcoholism treatment center.  SEEK IMMEDIATE MEDICAL CARE IF:  You have a seizure.  You have a fever.  You experience uncontrolled vomiting or you vomit up blood. This may be bright red or look like black coffee grounds.  You have blood in the stool.  This may be bright red or appear as a black, tarry, bad-smelling stool.  You become lightheaded or faint. Do not drive if you feel this way. Have someone else drive you or call 161 for help.  You become more agitated or confused.  You develop uncontrolled anxiety.  You begin to see things that are not really there (hallucinate). Your caregiver has determined that you completely understand your medical condition, and that your mental state is back to normal. You understand that you have been treated for alcohol withdrawal, have agreed not to drink any alcohol for a minimum of 1 day, will not operate a car or other machinery for 24 hours, and have had an opportunity to ask any questions about your condition. Document Released: 07/13/2005 Document Revised: 12/26/2011 Document Reviewed: 05/21/2008 [facility] Patient Information 2015 [place], [place]. This information is not intended to replace advice given to you by your health care provider. Make sure you discuss any questions you have with your health care provider.  Emergency Department Resource Guide 1) Find a Doctor and Pay Out of Pocket Although you won't have to find out who is covered by your insurance plan, it is a good idea to ask around and get recommendations. You will then need to call the office and see if the doctor you have chosen will accept you as a new patient and what types of options they offer for patients who are self-pay. Some doctors offer discounts or will set up payment plans for their patients who do not have insurance, but you will need to ask so you aren't surprised when you get to your appointment.  2) Contact Your Local Health Department Not all health departments have doctors that can see patients for sick visits, but many do, so it is worth a call to see if yours does. If you don't know where your local health department is, you can check in your phone book. The CDC also has a tool to help you locate your state's health  department, and many state websites also have listings of all of their local health departments.  3) Find a Walk-in Clinic If your illness is not likely to be very severe or complicated, you may want to try a walk in clinic. These are popping up all over the country in pharmacies, drugstores, and shopping centers. They're usually staffed by nurse practitioners or physician assistants that have been trained to treat common illnesses and complaints. They're usually fairly quick and inexpensive. However, if you have serious medical issues or chronic medical problems, these are probably not your best option.  No Primary Care Doctor: - Call Health Connect at  [phone] - they can help you locate a primary care doctor that  accepts your insurance, provides certain services, etc. - Physician Referral Service- [phone]  Chronic Pain Problems: Organization         Address  Phone   Notes  [facility]  [phone] Patients need to be referred by their primary care doctor.   Medication Assistance: Organization         Address  Phone   Notes  [facility] Medication [facility] [address] [place], [place] [address] [phone] --Must be a resident of [facility] -- Must  have NO insurance coverage whatsoever (no Medicaid/ Medicare, etc.) -- The pt. MUST have a primary care doctor that directs their care regularly and follows them in the community   Med[facility]ist  [phone]   [employer]  [phone]    Agencies that provide inexpensive medical care: Organization         Address  Phone   Notes  [facility]  [phone]   [facility]    [phone]   [facility] [address] [place], [place] [address] [phone]   [facility] [address] [place] [phone]   Planned Parenthood    [phone]   [facility]    [phone]    [facility] and [facility]  [address], [place] Phone:  [phone], Fax:  [phone] Hours of Operation:  9 am - 6 pm, M-F.  Also accepts Medicaid/Medicare and self-pay.  [facility]  [address], [place] Phone: [phone], Fax: [phone]. Hours of Operation:  8:30 am - 5:30 pm, M-F.  Also accepts Medicaid and self-pay.  [facility] [place] [address], [place] Phone: [phone]   [facility] [address] Natasha Bence [place], [place] [phone], Ext. [phone] Mondays & Thursdays: 7-9 AM.  First 15 patients are seen on a first come, first serve basis.    Medicaid-accepting [facility] Providers:  Organization         Address  Phone   Notes  [facility] [address], [place] [phone] Also accepts self-pay patients.  [facility] [address] Laurell Josephs [place], [place]  [phone]   [facility] [address], [place] [phone]   [facility] [address], [place] [phone]   Renaye Rakers [address], [place]   [phone] Only accepts [place] Access [place] patients after they have their name applied to their card.   Self-Pay (no insurance) in [facility]:  Organization         Address  Phone   Notes  Sickle Cell Patients, [facility] [address] [place], [place] [phone]   [facility] [address] [place], [place] [phone]   [facility] [place]  [address], [place] [phone]   [facility]/Dr. Osei-Bonsu  [address], [place] or [address], [place] [phone] Phone number for both [place] and [place] locations is the same.  [facility]gent [facility] and [facility] [address] [place] [phone]    [facility] [address], [place] or [address] [phone]   [facility] [address], [place] [phone], phone; [phone], fax Sees patients 1st and 3rd Saturday of every month.  Must not qualify for public or private insurance (i.e. Medicaid, Medicare, [place] Health Choice, Veterans' Benefits)  Household income should be no more than 200% of the poverty level The clinic cannot treat you if you are pregnant or think you are pregnant  Sexually transmitted diseases are not treated at the clinic.    Dental Care: Organization         Address  Phone  Notes  [facility] [address] [place], [place] [phone] Accepts children up to age 40 who are enrolled in [place] or [place] Health Choice; pregnant women with a Medicaid card; and children who have applied for Medicaid or [place] Health Choice, but were declined, whose parents can pay a reduced fee at time of service.  [facility] of [facility]  [address], [place] [phone] Accepts children up to age 52 who are enrolled in [place] or [place] Health Choice; pregnant women with a Medicaid card; and children who have applied for Medicaid or [place] Health Choice, but were declined, whose parents can pay a reduced fee at time of service.  [facility]  [address] [place], [place] [phone] Patients are seen by appointment only. Walk-ins are not accepted. [facility] will see patients 34 years of age and older. Monday - Tuesday (8am-5pm) Most Wednesdays (8:30-5pm) $30 per visit, cash only  [facility]  [address], [facility] [phone] Patients are seen by appointment only. Walk-ins are not accepted. [facility] will see patients 25 years of age and older. One Wednesday Evening (Monthly: Volunteer Based).   $30 per visit, cash only  [employer]  [phone] for adults; Children under age 39, call Graduate Pediatric Dentistry at [phone]. Children aged 28-14, please call [phone] to request a pediatric application.  Dental services are provided in all areas of dental care including fillings, crowns and bridges, complete and partial dentures, implants, gum treatment, root canals, and extractions. Preventive care is also provided. Treatment is provided to both adults and children. Patients are selected via a lottery and there is often a waiting list.   [facility] [address], [place]  [phone] [email or web address]   [facility] [address] [place], [place] [phone], Ext. [phone] Second and Fourth Thursday of each month, opens at 6:30 AM; Clinic ends at 9 AM.  Patients are seen on a first-come first-served basis, and a limited number are seen during each clinic.   [facility]  [address] Ether Griffins [place], [place] [phone]   Eligibility Requirements You must have lived in [place], [place], or [place] counties for at least the last three months.   You cannot be eligible for state or federal sponsored [place], including [employer], [place], or [employer].   You generally cannot be eligible for healthcare insurance through your employer.    How to apply: Eligibility screenings are held every Tuesday and Wednesday afternoon from 1:00 pm until 4:00 pm. You do not need an appointment for the interview!  [facility] [address], [place], [place] [phone]   [facility] Health Department  [phone]   [facility] Health Department  [phone]   [facility] Health Department  [phone]    Behavioral Health Resources in the Community: Intensive Outpatient Programs Organization         Address  Phone  Notes  [facility] [address] [place], [place] [phone]   [facility] Outpatient [address] [place], [place] [phone]   [facility] [address] [place], [place]  [phone]   [facility] [address]  [place], [place] [phone] or [phone]   Substance Abuse Resources Organization  Address  Phone  Notes  [facility] Drug Services  [phone]   [facility]  [phone]   [facility] [place]  [phone]   Floydene Flock  [phone]   Residential & Outpatient Substance Abuse Program  [phone]   Psychological Services Organization         Address  Phone  Notes  [facility] Behavioral Health  [phone]   [facility] Services  [phone]   [facility] [address], [place] [phone] or [phone]    Mobile Crisis Teams Organization         Address  Phone  Notes  [facility], Mobile Crisis Care Unit  [phone]   [facility]  [address] [place], [place] [phone]   Doristine Locks [address] [place] [phone]    Self-Help/Support Groups Organization         Address  Phone             Notes  [facility] [place] - variety of support groups  336- I7437963 Call for more information  Narcotics Anonymous (NA), [facility] [address], [employer] [place]  2 meetings at this location   Statistician         Address  Phone  Notes  [facility] [address],    [place]  [phone]   [facility]  [address], [place] [address], [place], [place] [phone]   [facility] [address] [place], [place] [phone] Admissions: 8am-3pm M-F  [facility] [address]    [place], [place] [phone]   [facility] [address] [place], [place], [place] [phone]   [facility] [address],  [place], [place] [phone]   [facility] - Intensive Outpatient [address] Laurell Josephs [address], [place], [place] [phone]   [facility] ([facility] [address]  [place], [place] [phone] or [phone]   [facility] ([facility]) [address] [place], [place] [phone] Accepts Medicaid  [facility] [address]  [place] [phone] Substance Abuse/Addiction Treatment   [facility] Organization         Address  Phone  Notes  [facility]  [phone]   Angie Fava, PhD [address] Ervin Knack [place], [place]   [phone] or [phone]   [facility] Behavioral   [address] [place], [place] [phone]   [facility] [address] [place], [place] [phone] Insurance/Medicaid/sponsorship through [facility] and Families [address]                                    [place], [place] [phone] Therapy/tele-psych/case  [facility] [address], [place] [phone]    Dr. Lolly Mustache  [phone]   [facility]  [employer] [facility] Dept. 1) [address] [place] 2) [address] [place] 3)  [address], [place] [phone]  [phone]   [facility] Child Abuse Hotline [phone] or [phone] (After Hours)

## 2015-04-30 NOTE — ED Provider Notes (Signed)
CSN: [id number]     Arrival date & time 04/30/15  1355 History   First MD Initiated Contact with Patient 04/30/15 1900     Chief Complaint  Patient presents with  . Alcohol Problem     (Consider location/radiation/quality/duration/timing/severity/associated sxs/prior Treatment) HPI 59 y.o. male presents requesting help detoxing from alcohol. He denies history of seizures or hallucinations when coming down off alcohol. Denies medical complaints such as fever, nausea, vomiting, cough, chest pain. He states that he has been binging on alcohol for the last 4 days.  He denies homicidal or suicidal ideations. Last drink this morning. Past Medical History  Diagnosis Date  . Mental health disorder   . Scoliosis   . Anxiety   . Depression   . Hep C w/o coma, chronic   . COPD (chronic obstructive pulmonary disease)   . Alcoholism   . Thyroid disease    Past Surgical History  Procedure Laterality Date  . Thyroid surgery    . Skin cancer excision     Family History  Problem Relation Age of Onset  . Dementia Mother    History  Substance Use Topics  . Smoking status: Current Every Day Smoker -- 0.50 packs/day for 40 years    Types: Cigarettes  . Smokeless tobacco: Not on file  . Alcohol Use: Yes     Comment: 4 40 oz beers per day    Review of Systems  All other systems reviewed and are negative.     Allergies  Review of patient's allergies indicates no known allergies.  Home Medications   Prior to Admission medications   Medication Sig Start Date End Date Taking? Authorizing Provider  chlordiazePOXIDE (LIBRIUM) 25 MG capsule 50mg  PO TID x 1D, then 25-50mg  PO BID X 1D, then 25-50mg  PO QD X 1D 04/30/15   Mirian MoMatthew Mychelle Kendra, MD  FLUoxetine (PROZAC) 40 MG capsule Take 40 mg by mouth daily.      Historical Provider, MD  gabapentin (NEURONTIN) 100 MG capsule Take 1 capsule (100 mg total) by mouth 3 (three) times daily. For anxiety symptoms 04/02/13   Sanjuana KavaAgnes I Nwoko, NP  ibuprofen  (ADVIL,MOTRIN) 200 MG tablet Take 400 mg by mouth every 6 (six) hours as needed (pain).    Historical Provider, MD  oxyCODONE-acetaminophen (PERCOCET) 5-325 MG per tablet Take 1 tablet by mouth every 6 (six) hours as needed. 03/29/15   Purvis SheffieldForrest Harrison, MD  traZODone (DESYREL) 50 MG tablet Take 50 mg by mouth at bedtime as needed for sleep.    Historical Provider, MD   BP 114/65 mmHg  Pulse 62  Temp(Src) 98.4 F (36.9 C) (Oral)  Resp 18  Ht 6' 2.5" (1.892 m)  Wt 168 lb (76.204 kg)  BMI 21.29 kg/m2  SpO2 95% Physical Exam  Constitutional: He is oriented to person, place, and time. He appears well-developed and well-nourished.  HENT:  Head: Normocephalic and atraumatic.  Eyes: Conjunctivae and EOM are normal.  Neck: Normal range of motion. Neck supple.  Cardiovascular: Normal rate, regular rhythm and normal heart sounds.   Pulmonary/Chest: Effort normal and breath sounds normal. No respiratory distress.  Abdominal: He exhibits no distension. There is no tenderness. There is no rebound and no guarding.  Musculoskeletal: Normal range of motion.  Neurological: He is alert and oriented to person, place, and time.  Skin: Skin is warm and dry.  Vitals reviewed.   ED Course  Procedures (including critical care time) Labs Review Labs Reviewed - No data to display  Imaging  Review No results found.   EKG Interpretation None      MDM   Final diagnoses:  ETOH abuse    59 y.o. male with pertinent PMH of etoh abuse, depression, COPD presents with request for detox from alcohol. Patient has no history of DTs or other severe complications of alcohol withdrawal. Physical exam benign, no medical complaints at this time. In a long and detailed discussion with the patient regarding treatment options and outpatient therapy. I discussed the use of a medication like Librium, after repeated warnings regarding use of this medication and alcohol and possibility of death with concurrent use, we  agreed to discharge home with a small amount of Librium. Discharged home in stable condition with outpatient referral.    I have reviewed all laboratory and imaging studies if ordered as above  1. ETOH abuse         Mirian Mo, MD 05/01/15 [phone]

## 2015-07-01 ENCOUNTER — Encounter (HOSPITAL_COMMUNITY): Payer: Self-pay | Admitting: *Deleted

## 2015-07-01 ENCOUNTER — Emergency Department (HOSPITAL_COMMUNITY)
Admission: EM | Admit: 2015-07-01 | Discharge: 2015-07-01 | Disposition: A | Discharge status: Other Acute Inpatient Hospital | Payer: Self-pay | Attending: Emergency Medicine | Admitting: Emergency Medicine

## 2015-07-01 DIAGNOSIS — F1010 Alcohol abuse, uncomplicated: Secondary | ICD-10-CM | POA: Insufficient documentation

## 2015-07-01 DIAGNOSIS — Z720 Tobacco use: Secondary | ICD-10-CM | POA: Insufficient documentation

## 2015-07-01 DIAGNOSIS — J449 Chronic obstructive pulmonary disease, unspecified: Secondary | ICD-10-CM | POA: Insufficient documentation

## 2015-07-01 DIAGNOSIS — Z8639 Personal history of other endocrine, nutritional and metabolic disease: Secondary | ICD-10-CM | POA: Insufficient documentation

## 2015-07-01 DIAGNOSIS — Z8619 Personal history of other infectious and parasitic diseases: Secondary | ICD-10-CM | POA: Insufficient documentation

## 2015-07-01 DIAGNOSIS — F1210 Cannabis abuse, uncomplicated: Secondary | ICD-10-CM | POA: Insufficient documentation

## 2015-07-01 DIAGNOSIS — F419 Anxiety disorder, unspecified: Secondary | ICD-10-CM | POA: Insufficient documentation

## 2015-07-01 DIAGNOSIS — F329 Major depressive disorder, single episode, unspecified: Secondary | ICD-10-CM | POA: Insufficient documentation

## 2015-07-01 DIAGNOSIS — Z79899 Other long term (current) drug therapy: Secondary | ICD-10-CM | POA: Insufficient documentation

## 2015-07-01 DIAGNOSIS — M419 Scoliosis, unspecified: Secondary | ICD-10-CM | POA: Insufficient documentation

## 2015-07-01 LAB — COMPREHENSIVE METABOLIC PANEL
ALK PHOS: 63 U/L (ref 38–126)
ALT: 21 U/L (ref 17–63)
AST: 37 U/L (ref 15–41)
Albumin: 3.9 g/dL (ref 3.5–5.0)
Anion gap: 11 (ref 5–15)
BUN: 8 mg/dL (ref 6–20)
CALCIUM: 9.5 mg/dL (ref 8.9–10.3)
CO2: 21 mmol/L — ABNORMAL LOW (ref 22–32)
CREATININE: 1.03 mg/dL (ref 0.61–1.24)
Chloride: 105 mmol/L (ref 101–111)
Glucose, Bld: 103 mg/dL — ABNORMAL HIGH (ref 65–99)
Potassium: 3.7 mmol/L (ref 3.5–5.1)
Sodium: 137 mmol/L (ref 135–145)
Total Bilirubin: 0.6 mg/dL (ref 0.3–1.2)
Total Protein: 6.7 g/dL (ref 6.5–8.1)

## 2015-07-01 LAB — CBC
HCT: 46.7 % (ref 39.0–52.0)
HEMOGLOBIN: 16.1 g/dL (ref 13.0–17.0)
MCH: 32.1 pg (ref 26.0–34.0)
MCHC: 34.5 g/dL (ref 30.0–36.0)
MCV: 93.2 fL (ref 78.0–100.0)
PLATELETS: 231 10*3/uL (ref 150–400)
RBC: 5.01 MIL/uL (ref 4.22–5.81)
RDW: 13.8 % (ref 11.5–15.5)
WBC: 8.1 10*3/uL (ref 4.0–10.5)

## 2015-07-01 LAB — ETHANOL: ALCOHOL ETHYL (B): 223 mg/dL — AB (ref <5)

## 2015-07-01 LAB — URINALYSIS, ROUTINE W REFLEX MICROSCOPIC
BILIRUBIN URINE: NEGATIVE
Glucose, UA: NEGATIVE mg/dL
HGB URINE DIPSTICK: NEGATIVE
KETONES UR: NEGATIVE mg/dL
Leukocytes, UA: NEGATIVE
NITRITE: NEGATIVE
Protein, ur: NEGATIVE mg/dL
SPECIFIC GRAVITY, URINE: 1.015 (ref 1.005–1.030)
UROBILINOGEN UA: 0.2 mg/dL (ref 0.0–1.0)
pH: 5 (ref 5.0–8.0)

## 2015-07-01 LAB — ACETAMINOPHEN LEVEL

## 2015-07-01 LAB — RAPID URINE DRUG SCREEN, HOSP PERFORMED
Amphetamines: NOT DETECTED
Barbiturates: NOT DETECTED
Benzodiazepines: NOT DETECTED
COCAINE: NOT DETECTED
Opiates: NOT DETECTED
Tetrahydrocannabinol: POSITIVE — AB

## 2015-07-01 LAB — SALICYLATE LEVEL

## 2015-07-01 MED ORDER — ONDANSETRON HCL 4 MG PO TABS: 4.0000 mg | ORAL_TABLET | Freq: Three times a day (TID) | ORAL | Status: DC | PRN

## 2015-07-01 MED ORDER — LORAZEPAM 1 MG PO TABS: 0.0000 mg | ORAL_TABLET | Freq: Four times a day (QID) | ORAL | Status: DC

## 2015-07-01 MED ORDER — IBUPROFEN 400 MG PO TABS: 600.0000 mg | ORAL_TABLET | Freq: Three times a day (TID) | ORAL | Status: DC | PRN

## 2015-07-01 MED ORDER — THIAMINE HCL 100 MG/ML IJ SOLN: 100.0000 mg | Freq: Every day | INTRAMUSCULAR | Status: DC

## 2015-07-01 MED ORDER — VITAMIN B-1 100 MG PO TABS
100.0000 mg | ORAL_TABLET | Freq: Every day | ORAL | Status: DC
  Administered 2015-07-01: 100 mg via ORAL
  Filled 2015-07-01: qty 1

## 2015-07-01 MED ORDER — GABAPENTIN 100 MG PO CAPS
100.0000 mg | ORAL_CAPSULE | Freq: Three times a day (TID) | ORAL | Status: DC
  Administered 2015-07-01: 100 mg via ORAL
  Filled 2015-07-01 (×3): qty 1

## 2015-07-01 MED ORDER — LORAZEPAM 2 MG/ML IJ SOLN
0.0000 mg | Freq: Four times a day (QID) | INTRAMUSCULAR | Status: DC
Start: 2015-07-01 — End: 2015-07-01

## 2015-07-01 MED ORDER — TRAZODONE HCL 50 MG PO TABS: 50.0000 mg | ORAL_TABLET | Freq: Every evening | ORAL | Status: DC | PRN

## 2015-07-01 MED ORDER — FLUOXETINE HCL 20 MG PO CAPS
40.0000 mg | ORAL_CAPSULE | Freq: Every day | ORAL | Status: DC
  Administered 2015-07-01: 40 mg via ORAL
  Filled 2015-07-01: qty 2

## 2015-07-01 MED ORDER — LORAZEPAM 1 MG PO TABS
0.0000 mg | ORAL_TABLET | Freq: Two times a day (BID) | ORAL | Status: DC
  Administered 2015-07-01: 1 mg via ORAL
  Filled 2015-07-01: qty 1

## 2015-07-01 MED ORDER — NICOTINE 21 MG/24HR TD PT24
21.0000 mg | MEDICATED_PATCH | Freq: Every day | TRANSDERMAL | Status: DC
  Administered 2015-07-01: 21 mg via TRANSDERMAL
  Filled 2015-07-01: qty 1

## 2015-07-01 MED ORDER — LORAZEPAM 2 MG/ML IJ SOLN: 0.0000 mg | Freq: Two times a day (BID) | INTRAMUSCULAR | Status: DC

## 2015-07-01 NOTE — ED Notes (Signed)
Called staffing for a sitter - unsure if he will be able to send one.  Patient changed into maroon scrubs, clothing removed from room and security called to wand

## 2015-07-01 NOTE — Progress Notes (Signed)
Per Joni Reining in intake at [facility], pt is accepting pending he be able to bring his home medications. Pt states family member is going to his home to retrieve medications and will bring them to ED. Writer informed Joni Reining who states bed will be held and requests she be contacted for further acceptance and transfer details when family member returns with meds. Nicole- at [phone].  Ilean Skill, MSW, LCSW Clinical Social Work, Disposition  07/01/2015 [phone]

## 2015-07-01 NOTE — ED Provider Notes (Signed)
CSN: [id number]     Arrival date & time 07/01/15  0236 History   First MD Initiated Contact with Patient 07/01/15 0329     Chief Complaint  Patient presents with  . Suicidal  . PTSD      (Consider location/radiation/quality/duration/timing/severity/associated sxs/prior Treatment) HPI Comments: Patient is a 59 yo M PMHx significant for EtOH abuse, COPD, Hep C w/o coma, mental health disorder, anxiety, tobacco abuse presenting to the ED for evaluation of suicidal and homicidal ideations. Patient states he has been feeling increasingly depressed over the last 2 weeks. He has had thoughts of walking out in traffic to take his own life. He endorses homicidal ideations with plan but will not endorse further information. He states he drinks 3-440 ounce beers a day, did drink today. Denies any history of alcohol withdrawal seizures or DTs. He has attempted to take his own life in the past. Denies any chest pain, shortness of breath, nausea, vomiting, diarrhea. No physical complaints at this time.   Past Medical History  Diagnosis Date  . Mental health disorder   . Scoliosis   . Anxiety   . Depression   . Hep C w/o coma, chronic   . COPD (chronic obstructive pulmonary disease)   . Alcoholism   . Thyroid disease    Past Surgical History  Procedure Laterality Date  . Thyroid surgery    . Skin cancer excision     Family History  Problem Relation Age of Onset  . Dementia Mother    Social History  Substance Use Topics  . Smoking status: Current Every Day Smoker -- 0.50 packs/day for 40 years    Types: Cigarettes  . Smokeless tobacco: None  . Alcohol Use: Yes     Comment: 4 40 oz beers per day    Review of Systems  Respiratory: Negative for shortness of breath.   Cardiovascular: Negative for chest pain.  Gastrointestinal: Negative for nausea, vomiting, abdominal pain and diarrhea.  Psychiatric/Behavioral: Positive for suicidal ideas and dysphoric mood. Negative for hallucinations.   All other systems reviewed and are negative.     Allergies  Review of patient's allergies indicates no known allergies.  Home Medications   Prior to Admission medications   Medication Sig Start Date End Date Taking? Authorizing Provider  chlordiazePOXIDE (LIBRIUM) 25 MG capsule  PO TID x 1D, then 25-50mg  PO BID X 1D, then 25-50mg  PO QD X 1D 04/30/15   Mirian Mo, MD  FLUoxetine (PROZAC) 40 MG capsule Take 40 mg by mouth daily.      Historical Provider, MD  gabapentin (NEURONTIN) 100 MG capsule Take 1 capsule (100 mg total) by mouth 3 (three) times daily. For anxiety symptoms 04/02/13   Sanjuana Kava, NP  ibuprofen (ADVIL,MOTRIN) 200 MG tablet Take 400 mg by mouth every 6 (six) hours as needed (pain).    Historical Provider, MD  oxyCODONE-acetaminophen (PERCOCET) 5-325 MG per tablet Take 1 tablet by mouth every 6 (six) hours as needed. 03/29/15   Purvis Sheffield, MD  traZODone (DESYREL) 50 MG tablet Take 50 mg by mouth at bedtime as needed for sleep.    Historical Provider, MD   BP 104/58 mmHg  Pulse 98  Temp(Src) 98.4 F (36.9 C) (Oral)  Resp 20  SpO2 92% Physical Exam  Constitutional: He is oriented to person, place, and time. He appears well-developed and well-nourished. No distress.  HENT:  Head: Normocephalic and atraumatic.  Right Ear: External ear normal.  Left Ear: External ear normal.  Nose: Nose normal.  Mouth/Throat: Oropharynx is clear and moist.  Eyes: Conjunctivae are normal.  Neck: Normal range of motion. Neck supple.  No nuchal rigidity.   Cardiovascular: Normal rate, regular rhythm and normal heart sounds.   Pulmonary/Chest: Effort normal and breath sounds normal.  Abdominal: Soft. There is no tenderness.  Musculoskeletal: Normal range of motion.  Neurological: He is alert and oriented to person, place, and time.  Skin: Skin is warm and dry. He is not diaphoretic.  Psychiatric: He has a normal mood and affect. He is not actively hallucinating. He  expresses homicidal and suicidal ideation. He expresses suicidal plans and homicidal plans.  Nursing note and vitals reviewed.   ED Course  Procedures (including critical care time) Labs Review Labs Reviewed  COMPREHENSIVE METABOLIC PANEL  ETHANOL  SALICYLATE LEVEL  ACETAMINOPHEN LEVEL  CBC  URINE RAPID DRUG SCREEN, HOSP PERFORMED    Imaging Review No results found. I have personally reviewed and evaluated these images and lab results as part of my medical decision-making.   EKG Interpretation None      MDM   Final diagnoses:  Alcohol abuse    Filed Vitals:   07/01/15 1618  BP: 123/87  Pulse: 71  Temp: 98.2 F (36.8 C)  Resp: 20   Afebrile, NAD, non-toxic appearing, AAOx4.   Pt presents to the ED for medical clearance.  Pt is  currently having SI ideations. Pt has a plan.   The patient currently does not have any acute physical complaints and is in no acute distress. The patients demeanor is appropriate. No acute signs of withdrawal. AG upper level of nml likely related to [facility] use will continue to encourage oral rehydration, no acute N/V. The patient was brought to ED by self an is seeking voluntarily seeking placement/behavioral health assistance  ACT consult was appreciated and pt was moved to Psych ED for further evaluation.        Francee Piccolo, PA-C 07/03/15 2016  Devoria Albe, MD 07/06/15 [phone]
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
## 2015-07-01 NOTE — Progress Notes (Signed)
Pt's family brought his home medications per ED (Neurontin, Prozac, Trazodone). Informed Nicole at [facility], who states pt has been accepted by Dr. Ellery Plunk and can arrive any time this afternoon for admission. RN report number is [phone].  Spoke with [facility] re: pt's placement.   Ilean Skill, MSW, LCSW Clinical Social Work, Disposition  07/01/2015 [phone]

## 2015-07-01 NOTE — ED Notes (Signed)
Needs to get somewhere to talk about his mental health.  Has been on a drinking binge for 3 days.  Talking about walking in front of a car.  States he cannot live on the streets and I will drink.  If I don' get help from here I will be back.

## 2015-07-01 NOTE — ED Notes (Signed)
Wanding completed 

## 2015-07-01 NOTE — Progress Notes (Signed)
At request of psychiatry per [facility], assisting with referrals for detox.   [facility] states they may have detox bed availability- faxed clinical information for review.  [facility] states one male detox bed may open today. Requested assessment and vitals, which CSW provided. Pt will call [facility] from ED to complete prescreen in order to be considered for admission.  Noted that pt's CIWA =1 as of 7:30am which may indicate he will not be accepted to inpatient detox. Should pt not meet inpatient detox criteria per these facilities, psychiatry recommends he be d/c with resources for SA treatment.  Ilean Skill, MSW, LCSW Clinical Social Work, Disposition  07/01/2015 [phone]

## 2015-07-01 NOTE — ED Provider Notes (Signed)
Patient alert pleasant cooperative Glasgow Coma Score 15 ambulation without difficulty. Stable for transfer Results for orders placed or performed during the hospital encounter of 07/01/15  Comprehensive metabolic panel  Result Value Ref Range   Sodium 137 135 - 145 mmol/L   Potassium 3.7 3.5 - 5.1 mmol/L   Chloride 105 101 - 111 mmol/L   CO2 21 (L) 22 - 32 mmol/L   Glucose, Bld 103 (H) 65 - 99 mg/dL   BUN 8 6 - 20 mg/dL   Creatinine, Ser 1.61 0.61 - 1.24 mg/dL   Calcium 9.5 8.9 - 09.6 mg/dL   Total Protein 6.7 6.5 - 8.1 g/dL   Albumin 3.9 3.5 - 5.0 g/dL   AST 37 15 - 41 U/L   ALT 21 17 - 63 U/L   Alkaline Phosphatase 63 38 - 126 U/L   Total Bilirubin 0.6 0.3 - 1.2 mg/dL   GFR calc non Af Amer >60 >60 mL/min   GFR calc Af Amer >60 >60 mL/min   Anion gap 11 5 - 15  Ethanol (ETOH)  Result Value Ref Range   Alcohol, Ethyl (B) 223 (H) <5 mg/dL  Salicylate level  Result Value Ref Range   Salicylate Lvl <4.0 2.8 - 30.0 mg/dL  Acetaminophen level  Result Value Ref Range   Acetaminophen (Tylenol), Serum <10 (L) 10 - 30 ug/mL  CBC  Result Value Ref Range   WBC 8.1 4.0 - 10.5 K/uL   RBC 5.01 4.22 - 5.81 MIL/uL   Hemoglobin 16.1 13.0 - 17.0 g/dL   HCT 04.5 40.9 - 81.1 %   MCV 93.2 78.0 - 100.0 fL   MCH 32.1 26.0 - 34.0 pg   MCHC 34.5 30.0 - 36.0 g/dL   RDW 91.4 78.2 - 95.6 %   Platelets 231 150 - 400 K/uL  Urine rapid drug screen (hosp performed) (Not at [facility])  Result Value Ref Range   Opiates NONE DETECTED NONE DETECTED   Cocaine NONE DETECTED NONE DETECTED   Benzodiazepines NONE DETECTED NONE DETECTED   Amphetamines NONE DETECTED NONE DETECTED   Tetrahydrocannabinol POSITIVE (A) NONE DETECTED   Barbiturates NONE DETECTED NONE DETECTED  Urinalysis, Routine w reflex microscopic  Result Value Ref Range   Color, Urine YELLOW YELLOW   APPearance CLEAR CLEAR   Specific Gravity, Urine 1.015 1.005 - 1.030   pH 5.0 5.0 - 8.0   Glucose, UA NEGATIVE NEGATIVE mg/dL   Hgb urine  dipstick NEGATIVE NEGATIVE   Bilirubin Urine NEGATIVE NEGATIVE   Ketones, ur NEGATIVE NEGATIVE mg/dL   Protein, ur NEGATIVE NEGATIVE mg/dL   Urobilinogen, UA 0.2 0.0 - 1.0 mg/dL   Nitrite NEGATIVE NEGATIVE   Leukocytes, UA NEGATIVE NEGATIVE   No results found.   Doug Sou, MD 07/01/15 [phone]

## 2015-07-01 NOTE — BH Assessment (Signed)
[facility] Assessment Progress Note Attempted to get history from MD, but ED staff unable to locate PA who saw pt. RN will put machine in the room, and assessment will begin shortly.

## 2015-07-01 NOTE — ED Notes (Signed)
Attempted report 

## 2015-07-15 ENCOUNTER — Observation Stay (HOSPITAL_COMMUNITY)
Admission: AD | Admit: 2015-07-15 | Discharge: 2015-07-18 | Disposition: A | Discharge status: Other Acute Inpatient Hospital | Payer: Federal, State, Local not specified - Other | Source: Intra-hospital | Attending: Psychiatry | Admitting: Psychiatry

## 2015-07-15 ENCOUNTER — Emergency Department (HOSPITAL_COMMUNITY)
Admission: EM | Admit: 2015-07-15 | Discharge: 2015-07-15 | Disposition: A | Discharge status: Other Acute Inpatient Hospital | Payer: Self-pay | Attending: Emergency Medicine | Admitting: Emergency Medicine

## 2015-07-15 ENCOUNTER — Encounter (HOSPITAL_COMMUNITY): Payer: Self-pay | Admitting: Emergency Medicine

## 2015-07-15 ENCOUNTER — Encounter (HOSPITAL_COMMUNITY): Payer: Self-pay

## 2015-07-15 DIAGNOSIS — R45850 Homicidal ideations: Secondary | ICD-10-CM

## 2015-07-15 DIAGNOSIS — Z8739 Personal history of other diseases of the musculoskeletal system and connective tissue: Secondary | ICD-10-CM | POA: Insufficient documentation

## 2015-07-15 DIAGNOSIS — Y9289 Other specified places as the place of occurrence of the external cause: Secondary | ICD-10-CM | POA: Insufficient documentation

## 2015-07-15 DIAGNOSIS — F99 Mental disorder, not otherwise specified: Secondary | ICD-10-CM | POA: Insufficient documentation

## 2015-07-15 DIAGNOSIS — Y9389 Activity, other specified: Secondary | ICD-10-CM | POA: Insufficient documentation

## 2015-07-15 DIAGNOSIS — S8992XA Unspecified injury of left lower leg, initial encounter: Secondary | ICD-10-CM | POA: Insufficient documentation

## 2015-07-15 DIAGNOSIS — J449 Chronic obstructive pulmonary disease, unspecified: Secondary | ICD-10-CM | POA: Insufficient documentation

## 2015-07-15 DIAGNOSIS — Z23 Encounter for immunization: Secondary | ICD-10-CM | POA: Insufficient documentation

## 2015-07-15 DIAGNOSIS — Z590 Homelessness: Secondary | ICD-10-CM | POA: Insufficient documentation

## 2015-07-15 DIAGNOSIS — Z8639 Personal history of other endocrine, nutritional and metabolic disease: Secondary | ICD-10-CM | POA: Insufficient documentation

## 2015-07-15 DIAGNOSIS — F329 Major depressive disorder, single episode, unspecified: Secondary | ICD-10-CM | POA: Insufficient documentation

## 2015-07-15 DIAGNOSIS — M25569 Pain in unspecified knee: Secondary | ICD-10-CM | POA: Insufficient documentation

## 2015-07-15 DIAGNOSIS — F10230 Alcohol dependence with withdrawal, uncomplicated: Secondary | ICD-10-CM | POA: Diagnosis not present

## 2015-07-15 DIAGNOSIS — F1290 Cannabis use, unspecified, uncomplicated: Secondary | ICD-10-CM | POA: Insufficient documentation

## 2015-07-15 DIAGNOSIS — S51812A Laceration without foreign body of left forearm, initial encounter: Secondary | ICD-10-CM | POA: Insufficient documentation

## 2015-07-15 DIAGNOSIS — F1024 Alcohol dependence with alcohol-induced mood disorder: Principal | ICD-10-CM | POA: Insufficient documentation

## 2015-07-15 DIAGNOSIS — Z79899 Other long term (current) drug therapy: Secondary | ICD-10-CM | POA: Insufficient documentation

## 2015-07-15 DIAGNOSIS — F419 Anxiety disorder, unspecified: Secondary | ICD-10-CM | POA: Insufficient documentation

## 2015-07-15 DIAGNOSIS — M419 Scoliosis, unspecified: Secondary | ICD-10-CM | POA: Insufficient documentation

## 2015-07-15 DIAGNOSIS — Z8619 Personal history of other infectious and parasitic diseases: Secondary | ICD-10-CM | POA: Insufficient documentation

## 2015-07-15 DIAGNOSIS — F17210 Nicotine dependence, cigarettes, uncomplicated: Secondary | ICD-10-CM | POA: Insufficient documentation

## 2015-07-15 DIAGNOSIS — S41112A Laceration without foreign body of left upper arm, initial encounter: Secondary | ICD-10-CM | POA: Insufficient documentation

## 2015-07-15 DIAGNOSIS — Z85828 Personal history of other malignant neoplasm of skin: Secondary | ICD-10-CM | POA: Insufficient documentation

## 2015-07-15 DIAGNOSIS — Y998 Other external cause status: Secondary | ICD-10-CM | POA: Insufficient documentation

## 2015-07-15 DIAGNOSIS — B182 Chronic viral hepatitis C: Secondary | ICD-10-CM | POA: Insufficient documentation

## 2015-07-15 DIAGNOSIS — F1410 Cocaine abuse, uncomplicated: Secondary | ICD-10-CM | POA: Insufficient documentation

## 2015-07-15 DIAGNOSIS — F1010 Alcohol abuse, uncomplicated: Secondary | ICD-10-CM

## 2015-07-15 DIAGNOSIS — Z720 Tobacco use: Secondary | ICD-10-CM | POA: Insufficient documentation

## 2015-07-15 DIAGNOSIS — R45851 Suicidal ideations: Secondary | ICD-10-CM

## 2015-07-15 LAB — COMPREHENSIVE METABOLIC PANEL
ALBUMIN: 4 g/dL (ref 3.5–5.0)
ALT: 22 U/L (ref 17–63)
ANION GAP: 10 (ref 5–15)
AST: 26 U/L (ref 15–41)
Alkaline Phosphatase: 72 U/L (ref 38–126)
BUN: 9 mg/dL (ref 6–20)
CHLORIDE: 103 mmol/L (ref 101–111)
CO2: 22 mmol/L (ref 22–32)
Calcium: 9.4 mg/dL (ref 8.9–10.3)
Creatinine, Ser: 0.99 mg/dL (ref 0.61–1.24)
GFR calc non Af Amer: >60 mL/min (ref >60)
GLUCOSE: 90 mg/dL (ref 65–99)
Potassium: 4.2 mmol/L (ref 3.5–5.1)
SODIUM: 135 mmol/L (ref 135–145)
Total Bilirubin: 0.4 mg/dL (ref 0.3–1.2)
Total Protein: 6.4 g/dL — ABNORMAL LOW (ref 6.5–8.1)

## 2015-07-15 LAB — CBC
HEMATOCRIT: 45.3 % (ref 39.0–52.0)
HEMOGLOBIN: 15.7 g/dL (ref 13.0–17.0)
MCH: 32.1 pg (ref 26.0–34.0)
MCHC: 34.7 g/dL (ref 30.0–36.0)
MCV: 92.6 fL (ref 78.0–100.0)
Platelets: 204 10*3/uL (ref 150–400)
RBC: 4.89 MIL/uL (ref 4.22–5.81)
RDW: 13.3 % (ref 11.5–15.5)
WBC: 6.2 10*3/uL (ref 4.0–10.5)

## 2015-07-15 LAB — ACETAMINOPHEN LEVEL

## 2015-07-15 LAB — RAPID URINE DRUG SCREEN, HOSP PERFORMED
AMPHETAMINES: NOT DETECTED
BENZODIAZEPINES: POSITIVE — AB
Barbiturates: NOT DETECTED
COCAINE: NOT DETECTED
OPIATES: NOT DETECTED
TETRAHYDROCANNABINOL: NOT DETECTED

## 2015-07-15 LAB — SALICYLATE LEVEL

## 2015-07-15 LAB — ETHANOL: Alcohol, Ethyl (B): 222 mg/dL — ABNORMAL HIGH (ref <5)

## 2015-07-15 MED ORDER — ALUM & MAG HYDROXIDE-SIMETH 200-200-20 MG/5ML PO SUSP: 30.0000 mL | ORAL | Status: DC | PRN

## 2015-07-15 MED ORDER — ZOLPIDEM TARTRATE 5 MG PO TABS: 5.0000 mg | ORAL_TABLET | Freq: Every evening | ORAL | Status: DC | PRN

## 2015-07-15 MED ORDER — LORAZEPAM 1 MG PO TABS
1.0000 mg | ORAL_TABLET | Freq: Four times a day (QID) | ORAL | Status: AC | PRN
  Administered 2015-07-18: 1 mg via ORAL
  Filled 2015-07-15: qty 1

## 2015-07-15 MED ORDER — IBUPROFEN 400 MG PO TABS: 600.0000 mg | ORAL_TABLET | Freq: Three times a day (TID) | ORAL | Status: DC | PRN

## 2015-07-15 MED ORDER — FLUOXETINE HCL 20 MG PO CAPS
40.0000 mg | ORAL_CAPSULE | Freq: Every day | ORAL | Status: DC
  Administered 2015-07-15: 40 mg via ORAL
  Filled 2015-07-15: qty 2

## 2015-07-15 MED ORDER — FLUOXETINE HCL 20 MG PO CAPS
20.0000 mg | ORAL_CAPSULE | Freq: Every day | ORAL | Status: DC
  Administered 2015-07-16 – 2015-07-18 (×3): 20 mg via ORAL
  Filled 2015-07-15 (×3): qty 1

## 2015-07-15 MED ORDER — LORAZEPAM 1 MG PO TABS
1.0000 mg | ORAL_TABLET | Freq: Two times a day (BID) | ORAL | Status: DC
  Administered 2015-07-18: 1 mg via ORAL
  Filled 2015-07-15: qty 1

## 2015-07-15 MED ORDER — LORAZEPAM 1 MG PO TABS
1.0000 mg | ORAL_TABLET | Freq: Four times a day (QID) | ORAL | Status: AC
  Administered 2015-07-15 – 2015-07-16 (×6): 1 mg via ORAL
  Filled 2015-07-15 (×6): qty 1

## 2015-07-15 MED ORDER — LOPERAMIDE HCL 2 MG PO CAPS: 2.0000 mg | ORAL_CAPSULE | ORAL | Status: AC | PRN

## 2015-07-15 MED ORDER — GABAPENTIN 100 MG PO CAPS
100.0000 mg | ORAL_CAPSULE | Freq: Three times a day (TID) | ORAL | Status: DC
  Administered 2015-07-15: 100 mg via ORAL
  Filled 2015-07-15 (×3): qty 1

## 2015-07-15 MED ORDER — LORAZEPAM 1 MG PO TABS: 0.0000 mg | ORAL_TABLET | Freq: Four times a day (QID) | ORAL | Status: DC

## 2015-07-15 MED ORDER — THIAMINE HCL 100 MG/ML IJ SOLN
INTRAMUSCULAR | Status: AC
  Filled 2015-07-15: qty 2

## 2015-07-15 MED ORDER — ACETAMINOPHEN 325 MG PO TABS: 650.0000 mg | ORAL_TABLET | ORAL | Status: DC | PRN

## 2015-07-15 MED ORDER — ONDANSETRON HCL 4 MG PO TABS: 4.0000 mg | ORAL_TABLET | Freq: Three times a day (TID) | ORAL | Status: DC | PRN

## 2015-07-15 MED ORDER — ONDANSETRON 4 MG PO TBDP: 4.0000 mg | ORAL_TABLET | Freq: Four times a day (QID) | ORAL | Status: AC | PRN

## 2015-07-15 MED ORDER — MAGNESIUM HYDROXIDE 400 MG/5ML PO SUSP: 30.0000 mL | Freq: Every day | ORAL | Status: DC | PRN

## 2015-07-15 MED ORDER — GABAPENTIN 100 MG PO CAPS
100.0000 mg | ORAL_CAPSULE | Freq: Three times a day (TID) | ORAL | Status: DC
  Administered 2015-07-15 – 2015-07-18 (×8): 100 mg via ORAL
  Filled 2015-07-15 (×8): qty 1

## 2015-07-15 MED ORDER — TRAZODONE HCL 50 MG PO TABS
50.0000 mg | ORAL_TABLET | Freq: Every day | ORAL | Status: DC
  Administered 2015-07-15 – 2015-07-18 (×3): 50 mg via ORAL
  Filled 2015-07-15 (×3): qty 1

## 2015-07-15 MED ORDER — ADULT MULTIVITAMIN W/MINERALS CH
ORAL_TABLET | ORAL | Status: AC
  Filled 2015-07-15: qty 1

## 2015-07-15 MED ORDER — NICOTINE 21 MG/24HR TD PT24
21.0000 mg | MEDICATED_PATCH | Freq: Every day | TRANSDERMAL | Status: DC
  Administered 2015-07-15: 21 mg via TRANSDERMAL
  Filled 2015-07-15: qty 1

## 2015-07-15 MED ORDER — VITAMIN B-1 100 MG PO TABS
100.0000 mg | ORAL_TABLET | Freq: Every day | ORAL | Status: DC
  Administered 2015-07-16 – 2015-07-18 (×3): 100 mg via ORAL
  Filled 2015-07-15 (×3): qty 1

## 2015-07-15 MED ORDER — LORAZEPAM 1 MG PO TABS: 1.0000 mg | ORAL_TABLET | Freq: Every day | ORAL | Status: DC

## 2015-07-15 MED ORDER — LORAZEPAM 1 MG PO TABS
1.0000 mg | ORAL_TABLET | Freq: Three times a day (TID) | ORAL | Status: AC
  Administered 2015-07-17 (×2): 1 mg via ORAL
  Filled 2015-07-15 (×2): qty 1

## 2015-07-15 MED ORDER — ADULT MULTIVITAMIN W/MINERALS CH
1.0000 | ORAL_TABLET | Freq: Every day | ORAL | Status: DC
  Administered 2015-07-15 – 2015-07-18 (×4): 1 via ORAL
  Filled 2015-07-15 (×3): qty 1

## 2015-07-15 MED ORDER — TETANUS-DIPHTH-ACELL PERTUSSIS 5-2.5-18.5 LF-MCG/0.5 IM SUSP
0.5000 mL | Freq: Once | INTRAMUSCULAR | Status: AC
  Administered 2015-07-15: 0.5 mL via INTRAMUSCULAR
  Filled 2015-07-15: qty 0.5

## 2015-07-15 MED ORDER — THIAMINE HCL 100 MG/ML IJ SOLN
100.0000 mg | Freq: Every day | INTRAMUSCULAR | Status: DC
  Filled 2015-07-15: qty 2

## 2015-07-15 MED ORDER — LORAZEPAM 1 MG PO TABS: 0.0000 mg | ORAL_TABLET | Freq: Two times a day (BID) | ORAL | Status: DC

## 2015-07-15 MED ORDER — VITAMIN B-1 100 MG PO TABS
100.0000 mg | ORAL_TABLET | Freq: Every day | ORAL | Status: DC
  Administered 2015-07-15: 100 mg via ORAL
  Filled 2015-07-15: qty 1

## 2015-07-15 MED ORDER — ACETAMINOPHEN 325 MG PO TABS: 650.0000 mg | ORAL_TABLET | Freq: Four times a day (QID) | ORAL | Status: DC | PRN

## 2015-07-15 MED ORDER — THIAMINE HCL 100 MG/ML IJ SOLN
100.0000 mg | Freq: Once | INTRAMUSCULAR | Status: AC
  Administered 2015-07-15: 100 mg via INTRAMUSCULAR

## 2015-07-15 MED ORDER — HYDROXYZINE HCL 25 MG PO TABS: 25.0000 mg | ORAL_TABLET | Freq: Four times a day (QID) | ORAL | Status: AC | PRN
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
## 2015-07-15 NOTE — ED Notes (Signed)
Security wanded pt ?

## 2015-07-15 NOTE — ED Notes (Signed)
[facility] transportation called  
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
## 2015-07-15 NOTE — ED Notes (Signed)
Charge and staffing notified of patient in A1.

## 2015-07-15 NOTE — [facility] Counselor (Signed)
[facility] Assessment Progress Note  Pt is interested in going to [facility] for treatment. He is living with his mother. Counselor called [facility] and spoke to [employer]. She requested that counselor call back early in the AM to see if a bed becomes available tomorrow.   Tim [name]. Ladona Ridgel, MS, NCC, [facility] Counselor

## 2015-07-15 NOTE — Progress Notes (Signed)
Nursing Admission Note  Patient is Voluntary arrival from Psych ED transport via [facility]. Patient is disheveled in appearance, cooperative, appropriate in behavior and communication. Patient admits to drinking daily with most recent alcohol consumption on 07/14/2015. Also admits to regular use of cannabis but states he "quit" smoking crack cocaine "early this year". Patient does admit to his alcolhol use having caused many problems for him in his life, states his mother, with whom he lives, provides great support for him as do his 2 brothers and one sister. Patient's belongings checked into lockers 3 and 6; skin search performed, patient introduced to Obs Unit and explained the procedures of the Unit to him. Patient voicing no complaints and denies any pain.

## 2015-07-15 NOTE — ED Notes (Signed)
Pt on with TTS.  
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
## 2015-07-15 NOTE — Progress Notes (Addendum)
Pt accepted to [facility] OBS unit bed 5 by Dr. Lucianne Muss. Pt can arrive anytime, report number for RN is [phone].  Spoke with [facility] re: pt's placement. LCSW at [facility] completed and faxed paperwork to [facility] for observation unit. RN to call [facility] for transport.  Ilean Skill, MSW, LCSW Clinical Social Work, Disposition  07/15/2015 [phone]   Deretha Emory, MSW Clinical Social Work: Emergency Room [phone]
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
## 2015-07-15 NOTE — Progress Notes (Signed)
Patient resting in bed at the beginning of this shift. Hiss mood and affects flat and depressed. Appeared not to be interested in the assessment. Although he denied SI/HI and denied Hallucinations. Writer offered encouragement and support. Q 15 minute check continues as ordered for safety.

## 2015-07-15 NOTE — ED Notes (Signed)
Pt wanded at this time. Placed in maroon scrubs. Belongings collected and secured.

## 2015-07-15 NOTE — ED Notes (Signed)
Regular Diet order of breakfast was called in for patient.

## 2015-07-16 DIAGNOSIS — F10230 Alcohol dependence with withdrawal, uncomplicated: Secondary | ICD-10-CM | POA: Diagnosis not present

## 2015-07-16 MED ORDER — TRAZODONE HCL 50 MG PO TABS: 50.0000 mg | ORAL_TABLET | Freq: Every day | ORAL | Status: DC

## 2015-07-16 MED ORDER — GABAPENTIN 100 MG PO CAPS: 100.0000 mg | ORAL_CAPSULE | Freq: Three times a day (TID) | ORAL | Status: DC

## 2015-07-16 MED ORDER — FLUOXETINE HCL 20 MG PO CAPS: 20.0000 mg | ORAL_CAPSULE | Freq: Every day | ORAL | Status: DC
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
## 2015-07-16 NOTE — BHH Counselor (Signed)
[facility] Assessment Progress Note  Per May Agustin, pt can stay another night to wait for [facility] bed in the AM.   Johny Shock. Ladona Ridgel, MS, NCC, LPCA Counselor

## 2015-07-16 NOTE — Progress Notes (Signed)
Pt ate 100% of his breakfast. He appears to have a flat ,blunted affect this am. Pt is very quiet and presently has gone back to sleep(8:40a)

## 2015-07-16 NOTE — Progress Notes (Signed)
[facility] Observation Unit Progress Note  Reason for Consult: Alcohol intoxication and suicidal ideations Referring Physician: EDP Patient Identification: Tim [name] MRN: [id number] Principal Diagnosis: Alcohol dependence with uncomplicated withdrawal Diagnosis:  Patient Active Problem List   Diagnosis Date Noted  . Alcohol dependence with uncomplicated withdrawal [E15.830] 07/15/2015    Priority: High  . Cellulitis of right lower extremity [L03.115] 03/29/2015  . Alcohol dependence [F10.20] 03/05/2012    Total Time spent with patient: 45 minutes  Subjective:  Tim [name] is a 59 y.o. male patient admitted with depression and suicidal ideations.  He presented initially to the ED with alcohol intoxication and suicidal ideations, requesting alcohol detox.  He states that he has a difficult time finding work.  [place] and states that he gets bored and drinks to pass time.    Today, he states that he currently receives his psych meds from [facility] and would like to go to [facility] for substance abuse.  We are awaiting bed placement for the morning.  We will keep overnight.  Dr Dwyane Dee agrees with plan.  Past Psychiatric History: Depression, alcohol dependence  Risk to Self:  Risk to Others:  Prior Inpatient Therapy:  Prior Outpatient Therapy:   Past Medical History:  Past Medical History  Diagnosis Date  . Mental health disorder   . Scoliosis   . Anxiety   . Depression   . Hep C w/o coma, chronic   . COPD (chronic obstructive pulmonary disease)   . Alcoholism   . Thyroid disease    Past Surgical History  Procedure Laterality Date  . Thyroid surgery    . Skin cancer excision     Family History:  Family History  Problem Relation Age of Onset  . Dementia Mother    Family Psychiatric History: substance  abuse  Social History:  History  Alcohol Use  . Yes    Comment: 4 40 oz beers per day    History  Drug Use  . Yes  . Special: Cocaine, Marijuana    Comment: also states will take pain pills every now and then, last use of cocaine and ETOH today at 1400   Social History   Social History  . Marital Status: Single    Spouse Name: N/A  . Number of Children: N/A  . Years of Education: N/A   Social History Main Topics  . Smoking status: Current Every Day Smoker -- 0.50 packs/day for 40 years    Types: Cigarettes  . Smokeless tobacco: Never Used  . Alcohol Use: Yes     Comment: 4 40 oz beers per day  . Drug Use: Yes    Special: Cocaine, Marijuana     Comment: also states will take pain pills every now and then, last use of cocaine and ETOH today at 1400  . Sexual Activity: No   Other Topics Concern  . None   Social History Narrative   ** Merged History Encounter **      Additional Social History:               Allergies: No Known Allergies  Labs:   Lab Results Last 48 Hours    Results for orders placed or performed during the hospital encounter of 07/15/15 (from the past 48 hour(s))  Comprehensive metabolic panel Status: Abnormal   Collection Time: 07/15/15 5:15 AM  Result Value Ref Range   Sodium 135 135 - 145 mmol/L   Potassium 4.2  3.5 - 5.1 mmol/L   Chloride 103 101 - 111 mmol/L   CO2 22 22 - 32 mmol/L   Glucose, Bld 90 65 - 99 mg/dL   BUN 9 6 - 20 mg/dL   Creatinine, Ser 0.99 0.61 - 1.24 mg/dL   Calcium 9.4 8.9 - 10.3 mg/dL   Total Protein 6.4 (L) 6.5 - 8.1 g/dL   Albumin 4.0 3.5 - 5.0 g/dL   AST 26 15 - 41 U/L   ALT 22 17 - 63 U/L   Alkaline  Phosphatase 72 38 - 126 U/L   Total Bilirubin 0.4 0.3 - 1.2 mg/dL   GFR calc non Af Amer >60 >60 mL/min   GFR calc Af Amer >60 >60 mL/min    Comment: (NOTE) The eGFR has been calculated using the CKD EPI equation. This calculation has not been validated in all clinical situations. eGFR's persistently <60 mL/min signify possible Chronic Kidney Disease.    Anion gap 10 5 - 15  Ethanol (ETOH) Status: Abnormal   Collection Time: 07/15/15 5:15 AM  Result Value Ref Range   Alcohol, Ethyl (B) 222 (H) <5 mg/dL    Comment:   LOWEST DETECTABLE LIMIT FOR SERUM ALCOHOL IS 5 mg/dL FOR MEDICAL PURPOSES ONLY   Salicylate level Status: None   Collection Time: 07/15/15 5:15 AM  Result Value Ref Range   Salicylate Lvl <9.1 2.8 - 30.0 mg/dL  Acetaminophen level Status: Abnormal   Collection Time: 07/15/15 5:15 AM  Result Value Ref Range   Acetaminophen (Tylenol), Serum <10 (L) 10 - 30 ug/mL    Comment:   THERAPEUTIC CONCENTRATIONS VARY SIGNIFICANTLY. A RANGE OF 10-30 ug/mL MAY BE AN EFFECTIVE CONCENTRATION FOR MANY PATIENTS. HOWEVER, SOME ARE BEST TREATED AT CONCENTRATIONS OUTSIDE THIS RANGE. ACETAMINOPHEN CONCENTRATIONS >150 ug/mL AT 4 HOURS AFTER INGESTION AND >50 ug/mL AT 12 HOURS AFTER INGESTION ARE OFTEN ASSOCIATED WITH TOXIC REACTIONS.   CBC Status: None   Collection Time: 07/15/15 5:15 AM  Result Value Ref Range   WBC 6.2 4.0 - 10.5 K/uL   RBC 4.89 4.22 - 5.81 MIL/uL   Hemoglobin 15.7 13.0 - 17.0 g/dL   HCT 45.3 39.0 - 52.0 %   MCV 92.6 78.0 - 100.0 fL   MCH 32.1 26.0 - 34.0 pg   MCHC 34.7 30.0 - 36.0 g/dL   RDW 13.3 11.5 - 15.5 %   Platelets 204 150 - 400 K/uL  Urine rapid drug screen (hosp performed) (Not at [facility]) Status: Abnormal    Collection Time: 07/15/15 5:54 AM  Result Value Ref Range   Opiates NONE DETECTED NONE DETECTED   Cocaine NONE DETECTED NONE DETECTED   Benzodiazepines POSITIVE (A) NONE DETECTED   Amphetamines NONE DETECTED NONE DETECTED   Tetrahydrocannabinol NONE DETECTED NONE DETECTED   Barbiturates NONE DETECTED NONE DETECTED    Comment:   DRUG SCREEN FOR MEDICAL PURPOSES ONLY. IF CONFIRMATION IS NEEDED FOR ANY PURPOSE, NOTIFY LAB WITHIN 5 DAYS.   LOWEST DETECTABLE LIMITS FOR URINE DRUG SCREEN Drug Class Cutoff (ng/mL) Amphetamine 1000 Barbiturate 200 Benzodiazepine 505 Tricyclics 697 Opiates 948 Cocaine 300 THC 50       Current Facility-Administered Medications  Medication Dose Route Frequency Provider Last Rate Last Dose  . acetaminophen (TYLENOL) tablet 650 mg 650 mg Oral Q6H PRN Patrecia Pour, NP    . alum & mag hydroxide-simeth (MAALOX/MYLANTA) 200-200-20 MG/5ML suspension 30 mL 30 mL Oral Q4H PRN Patrecia Pour, NP    . Derrill Memo ON 07/16/2015] FLUoxetine (PROZAC) capsule  20 mg 20 mg Oral Daily Patrecia Pour, NP    . gabapentin (NEURONTIN) capsule 100 mg 100 mg Oral TID Patrecia Pour, NP    . magnesium hydroxide (MILK OF MAGNESIA) suspension 30 mL 30 mL Oral Daily PRN Patrecia Pour, NP    . traZODone (DESYREL) tablet 50 mg 50 mg Oral QHS Patrecia Pour, NP      Musculoskeletal: Strength & Muscle Tone: within normal limits Gait & Station: normal Patient leans: N/A  Psychiatric Specialty Exam: Review of Systems  Constitutional: Negative.  HENT: Negative.  Eyes: Negative.  Respiratory: Negative.  Cardiovascular: Negative.  Gastrointestinal: Negative.  Genitourinary: Negative.  Musculoskeletal:  Negative.  Skin: Negative.  Neurological: Negative.  Endo/Heme/Allergies: Negative.  Psychiatric/Behavioral: Positive for depression, suicidal ideas and substance abuse.    BP 118/71 mmHg  Pulse 59  Temp(Src) 98.2 F (36.8 C) (Oral)  Resp 16  SpO2 96%   General Appearance: Neat  Eye Contact:: Fair  Speech: Normal Rate  Volume: Normal  Mood: Depressed  Affect: Congruent  Thought Process: Coherent  Orientation: Full (Time, Place, and Person)  Thought Content: WDL  Suicidal Thoughts: No  Homicidal Thoughts: No  Memory: Immediate; Fair Recent; Fair Remote; Fair  Judgement: Fair  Insight: Fair  Psychomotor Activity: Decreased  Concentration: Fair  Recall: [employer] of Knowledge:Fair  Language: Good  Akathisia: No  Handed: Right  AIMS (if indicated):   Assets: Leisure Time Physical Health Resilience  ADL's: Intact  Cognition: WNL  Sleep: poor   Treatment Plan Summary: Daily contact with patient to assess and evaluate symptoms and progress in treatment, Medication management and Plan substance induced mood disorder:  -Crisis stabilization -Medication management: Ativan alcohol detox protocol started along with Prozac 20 mg daily for depression, Trazodone 50 mg at bedtime for sleep, and gabapentin 100 mg TID for alcohol withdrawal.  -Individual and substance abuse counseling. -Will keep overnight.  Pending [facility] bed.    Disposition: Supportive therapy provided about ongoing stressors.  [facility], NP 07/16/2015

## 2015-07-16 NOTE — BHH Counselor (Addendum)
[facility] Assessment Progress Note  [phone] Counselor called and left a message with Shayla from [facility] on this date, checking on bed availability for pt.   Johny Shock. Ladona Ridgel, MS, NCC, [facility] Counselor    [phone] Counselor called Drema Pry back and was informed that their were no available beds today, but she may have a bed tomorrow. She requested that counselor fax pt's referral information to [phone]. Information faxed at [phone].

## 2015-07-17 DIAGNOSIS — F10230 Alcohol dependence with withdrawal, uncomplicated: Secondary | ICD-10-CM | POA: Diagnosis not present

## 2015-07-17 NOTE — Progress Notes (Signed)
D: Pt was pleasant during the assessment process. Informed the writer that he plans to be discharged tomorrow with follow up at [facility]. Stated, "Hopefully they'll be sending me up there tomorrow". Pt voiced no questions or concerns.  A:  Support and encouragement was offered. 15 min checks continued for safety.  R: Pt remains safe.

## 2015-07-17 NOTE — BH Assessment (Signed)
[facility] Assessment Progress Note  This Probation officer contacted [facility] this date to assist patient with admission, coordinator stated they would have a bed available on 10/1 and patient met criteria after information was reported. Patient will continue to stay in the Observational unit until he is discharged on 10/1 and will be linked to [facility]by their transportation coordinator. If that service is not available on weekends [facility] will provide assistance with transportation. Contact information for [facility] is [phone].

## 2015-07-17 NOTE — Progress Notes (Signed)
Met patient resting at the beginning of the shift. Denies pain, SI, AH/VH at this time. Made no new complaint. Patient stated "I will be going to [facility] tomorrow". Offered support and encouragement as needed. Every 15 minutes check for safety maintained. Will continue to monitor patient for safety and stability.

## 2015-07-17 NOTE — Progress Notes (Signed)
Patient is alert and oriented. Denies HI, SI, and AVH. Patient is calm, cooperative and pleasant. Patient is laying on recliner. Patient safety is maintained through q 15 min checks.  Patient is receptive and safety and dignity is maintained. Patient was to discharge to [facility] today, but will not be ready to discharge until tomorrow when the facility is ready.

## 2015-07-17 NOTE — Progress Notes (Signed)
[facility] Observation Unit Progress Note  Reason for Consult: Alcohol intoxication and suicidal ideations Referring Physician: EDP Patient Identification: Tim [name] MRN: [id number] Principal Diagnosis: Alcohol dependence with uncomplicated withdrawal Diagnosis:  Patient Active Problem List   Diagnosis Date Noted  . Alcohol dependence with uncomplicated withdrawal [M42.683] 07/15/2015    Priority: High  . Cellulitis of right lower extremity [L03.115] 03/29/2015  . Alcohol dependence [F10.20] 03/05/2012    Total Time spent with patient: 45 minutes  Subjective:  Tim [name] is a 59 y.o. male patient admitted with depression and suicidal ideations.  He presented initially to the ED with alcohol intoxication and suicidal ideations, requesting alcohol detox.  He states that he has a difficult time finding work.  [place]" and states that he gets bored and drinks to pass time.    Today, he states that he currently receives his psych meds from [facility] and would like to go to [facility] for substance abuse.  We will keep overnight.  Bed ready in the am per [facility].  Dr Dwyane Dee agrees with plan.  Past Psychiatric History: Depression, alcohol dependence  Risk to Self:  Risk to Others:  Prior Inpatient Therapy:  Prior Outpatient Therapy:   Past Medical History:  Past Medical History  Diagnosis Date  . Mental health disorder   . Scoliosis   . Anxiety   . Depression   . Hep C w/o coma, chronic   . COPD (chronic obstructive pulmonary disease)   . Alcoholism   . Thyroid disease    Past Surgical History  Procedure Laterality Date  . Thyroid surgery    . Skin cancer excision     Family History:  Family History  Problem Relation Age of Onset  . Dementia Mother    Family Psychiatric History: substance  abuse  Social History:  History  Alcohol Use  . Yes    Comment: 4 40 oz beers per day    History  Drug Use  . Yes  . Special: Cocaine, Marijuana    Comment: also states will take pain pills every now and then, last use of cocaine and ETOH today at 1400   Social History   Social History  . Marital Status: Single    Spouse Name: N/A  . Number of Children: N/A  . Years of Education: N/A   Social History Main Topics  . Smoking status: Current Every Day Smoker -- 0.50 packs/day for 40 years    Types: Cigarettes  . Smokeless tobacco: Never Used  . Alcohol Use: Yes     Comment: 4 40 oz beers per day  . Drug Use: Yes    Special: Cocaine, Marijuana     Comment: also states will take pain pills every now and then, last use of cocaine and ETOH today at 1400  . Sexual Activity: No   Other Topics Concern  . None   Social History Narrative   ** Merged History Encounter **      Additional Social History:               Allergies: No Known Allergies  Labs:   Lab Results Last 48 Hours    Results for orders placed or performed during the hospital encounter of 07/15/15 (from the past 48 hour(s))  Comprehensive metabolic panel Status: Abnormal   Collection Time: 07/15/15 5:15 AM  Result Value Ref Range   Sodium 135 135 - 145 mmol/L   Potassium  4.2 3.5 - 5.1 mmol/L   Chloride 103 101 - 111 mmol/L   CO2 22 22 - 32 mmol/L   Glucose, Bld 90 65 - 99 mg/dL   BUN 9 6 - 20 mg/dL   Creatinine, Ser 0.99 0.61 - 1.24 mg/dL   Calcium 9.4 8.9 - 10.3 mg/dL   Total Protein 6.4 (L) 6.5 - 8.1 g/dL   Albumin 4.0 3.5 - 5.0 g/dL   AST 26 15 - 41 U/L   ALT 22 17 - 63 U/L   Alkaline  Phosphatase 72 38 - 126 U/L   Total Bilirubin 0.4 0.3 - 1.2 mg/dL   GFR calc non Af Amer >60 >60 mL/min   GFR calc Af Amer >60 >60 mL/min    Comment: (NOTE) The eGFR has been calculated using the CKD EPI equation. This calculation has not been validated in all clinical situations. eGFR's persistently <60 mL/min signify possible Chronic Kidney Disease.    Anion gap 10 5 - 15  Ethanol (ETOH) Status: Abnormal   Collection Time: 07/15/15 5:15 AM  Result Value Ref Range   Alcohol, Ethyl (B) 222 (H) <5 mg/dL    Comment:   LOWEST DETECTABLE LIMIT FOR SERUM ALCOHOL IS 5 mg/dL FOR MEDICAL PURPOSES ONLY   Salicylate level Status: None   Collection Time: 07/15/15 5:15 AM  Result Value Ref Range   Salicylate Lvl <5.0 2.8 - 30.0 mg/dL  Acetaminophen level Status: Abnormal   Collection Time: 07/15/15 5:15 AM  Result Value Ref Range   Acetaminophen (Tylenol), Serum <10 (L) 10 - 30 ug/mL    Comment:   THERAPEUTIC CONCENTRATIONS VARY SIGNIFICANTLY. A RANGE OF 10-30 ug/mL MAY BE AN EFFECTIVE CONCENTRATION FOR MANY PATIENTS. HOWEVER, SOME ARE BEST TREATED AT CONCENTRATIONS OUTSIDE THIS RANGE. ACETAMINOPHEN CONCENTRATIONS >150 ug/mL AT 4 HOURS AFTER INGESTION AND >50 ug/mL AT 12 HOURS AFTER INGESTION ARE OFTEN ASSOCIATED WITH TOXIC REACTIONS.   CBC Status: None   Collection Time: 07/15/15 5:15 AM  Result Value Ref Range   WBC 6.2 4.0 - 10.5 K/uL   RBC 4.89 4.22 - 5.81 MIL/uL   Hemoglobin 15.7 13.0 - 17.0 g/dL   HCT 45.3 39.0 - 52.0 %   MCV 92.6 78.0 - 100.0 fL   MCH 32.1 26.0 - 34.0 pg   MCHC 34.7 30.0 - 36.0 g/dL   RDW 13.3 11.5 - 15.5 %   Platelets 204 150 - 400 K/uL  Urine rapid drug screen (hosp performed) (Not at [facility]) Status: Abnormal    Collection Time: 07/15/15 5:54 AM  Result Value Ref Range   Opiates NONE DETECTED NONE DETECTED   Cocaine NONE DETECTED NONE DETECTED   Benzodiazepines POSITIVE (A) NONE DETECTED   Amphetamines NONE DETECTED NONE DETECTED   Tetrahydrocannabinol NONE DETECTED NONE DETECTED   Barbiturates NONE DETECTED NONE DETECTED    Comment:   DRUG SCREEN FOR MEDICAL PURPOSES ONLY. IF CONFIRMATION IS NEEDED FOR ANY PURPOSE, NOTIFY LAB WITHIN 5 DAYS.   LOWEST DETECTABLE LIMITS FOR URINE DRUG SCREEN Drug Class Cutoff (ng/mL) Amphetamine 1000 Barbiturate 200 Benzodiazepine 037 Tricyclics 048 Opiates 889 Cocaine 300 THC 50       Current Facility-Administered Medications  Medication Dose Route Frequency Provider Last Rate Last Dose  . acetaminophen (TYLENOL) tablet 650 mg 650 mg Oral Q6H PRN Patrecia Pour, NP    . alum & mag hydroxide-simeth (MAALOX/MYLANTA) 200-200-20 MG/5ML suspension 30 mL 30 mL Oral Q4H PRN Patrecia Pour, NP    . Derrill Memo ON 07/16/2015] FLUoxetine (PROZAC)  capsule 20 mg 20 mg Oral Daily Patrecia Pour, NP    . gabapentin (NEURONTIN) capsule 100 mg 100 mg Oral TID Patrecia Pour, NP    . magnesium hydroxide (MILK OF MAGNESIA) suspension 30 mL 30 mL Oral Daily PRN Patrecia Pour, NP    . traZODone (DESYREL) tablet 50 mg 50 mg Oral QHS Patrecia Pour, NP      Musculoskeletal: Strength & Muscle Tone: within normal limits Gait & Station: normal Patient leans: N/A  Psychiatric Specialty Exam: Review of Systems  Constitutional: Negative.  HENT: Negative.  Eyes: Negative.  Respiratory: Negative.  Cardiovascular: Negative.  Gastrointestinal: Negative.  Genitourinary: Negative.  Musculoskeletal:  Negative.  Skin: Negative.  Neurological: Negative.  Endo/Heme/Allergies: Negative.  Psychiatric/Behavioral: Positive for depression, suicidal ideas and substance abuse.    BP 139/74 mmHg  Pulse 74  Temp(Src) 98 F (36.7 C) (Oral)  Resp 18  Ht 6' 2.5" (1.892 m)  Wt 78.926 kg (174 lb)  BMI 22.05 kg/m2  SpO2 100%  General Appearance: Neat  Eye Contact:: Fair  Speech: Normal Rate  Volume: Normal  Mood: Depressed  Affect: Congruent  Thought Process: Coherent  Orientation: Full (Time, Place, and Person)  Thought Content: WDL  Suicidal Thoughts: No  Homicidal Thoughts: No  Memory: Immediate; Fair Recent; Fair Remote; Fair  Judgement: Fair  Insight: Fair  Psychomotor Activity: Decreased  Concentration: Fair  Recall: [employer] of Knowledge:Fair  Language: Good  Akathisia: No  Handed: Right  AIMS (if indicated):   Assets: Leisure Time Physical Health Resilience  ADL's: Intact  Cognition: WNL  Sleep: poor   Treatment Plan Summary: Daily contact with patient to assess and evaluate symptoms and progress in treatment, Medication management and Plan substance induced mood disorder:  -Crisis stabilization -Medication management: Ativan alcohol detox protocol started along with Prozac 20 mg daily for depression, Trazodone 50 mg at bedtime for sleep, and gabapentin 100 mg TID for alcohol withdrawal.  -Individual and substance abuse counseling. -Will keep overnight.  Pending [facility] bed.  Spoke with Shayna at [facility] and they have accepted the patient.  She will have bed Saturday morning.     Disposition: Supportive therapy provided about ongoing stressors.  [facility] bed in the AM.  Der Dwyane Dee informed of plan and is in agreement of plan of care.    Janett Labella, NP 07/17/2015

## 2015-07-18 NOTE — BHH Counselor (Signed)
This Clinical research associate spoke with pt in regards to current discharge plans. Pt stated that he is aware that he will be transported to [facility] this morning and that he will receive treatment there. Pt is receptive of the discharge plan. This Clinical research associate will distribute information to the AM counselor in regards to contacting [facility] for transportation of pt.  This Clinical research associate will continue to provide support and encouragement throughout the remainder of the shift.  Ardelle Park, MA OBS Counselor

## 2015-07-18 NOTE — BHH Counselor (Addendum)
TC to [facility] at [facility]. Jasmine December reports that pt is on the acceptance list today, and the earliest pt can arrive is 63 am. Jasmine December says that [facility] does provide transportation, but the earliest they can pick pt up is 1 pm today. Writer spoke w/ Berneice Heinrich [facility] who states that pt can be picked up by [facility]. Writer spoke w/ Olegario Messier at [facility] who says that transportation will be here at 2 pm to pick up pt.   Evette Cristal, [place] Therapeutic Triage Specialist

## 2015-07-18 NOTE — Progress Notes (Signed)
Pt alert and cooperative. Affect/mood appropriate for circumstances. -SI/HI/A/V/H "I was on a 3 month binge, I feel good now".  Emotional support and encouragement given. Will continue to monitor closely and evaluate for stabilization.

## 2015-07-18 NOTE — Progress Notes (Signed)
Discharge instructions reviewed with patient. All belongings returned. [facility] present to transport patient.

## 2015-11-04 ENCOUNTER — Encounter (HOSPITAL_COMMUNITY): Payer: Self-pay | Admitting: *Deleted

## 2015-11-04 ENCOUNTER — Emergency Department (HOSPITAL_COMMUNITY)
Admission: EM | Admit: 2015-11-04 | Discharge: 2015-11-05 | Disposition: A | Discharge status: Other Acute Inpatient Hospital | Payer: Self-pay | Attending: Emergency Medicine | Admitting: Emergency Medicine

## 2015-11-04 DIAGNOSIS — M419 Scoliosis, unspecified: Secondary | ICD-10-CM | POA: Insufficient documentation

## 2015-11-04 DIAGNOSIS — F329 Major depressive disorder, single episode, unspecified: Secondary | ICD-10-CM | POA: Insufficient documentation

## 2015-11-04 DIAGNOSIS — F419 Anxiety disorder, unspecified: Secondary | ICD-10-CM | POA: Insufficient documentation

## 2015-11-04 DIAGNOSIS — Z8619 Personal history of other infectious and parasitic diseases: Secondary | ICD-10-CM | POA: Insufficient documentation

## 2015-11-04 DIAGNOSIS — R45851 Suicidal ideations: Secondary | ICD-10-CM

## 2015-11-04 DIAGNOSIS — F17210 Nicotine dependence, cigarettes, uncomplicated: Secondary | ICD-10-CM | POA: Insufficient documentation

## 2015-11-04 DIAGNOSIS — Z79899 Other long term (current) drug therapy: Secondary | ICD-10-CM | POA: Insufficient documentation

## 2015-11-04 DIAGNOSIS — F1210 Cannabis abuse, uncomplicated: Secondary | ICD-10-CM | POA: Insufficient documentation

## 2015-11-04 DIAGNOSIS — Z8639 Personal history of other endocrine, nutritional and metabolic disease: Secondary | ICD-10-CM | POA: Insufficient documentation

## 2015-11-04 DIAGNOSIS — F10129 Alcohol abuse with intoxication, unspecified: Secondary | ICD-10-CM | POA: Insufficient documentation

## 2015-11-04 DIAGNOSIS — J449 Chronic obstructive pulmonary disease, unspecified: Secondary | ICD-10-CM | POA: Insufficient documentation

## 2015-11-04 LAB — RAPID URINE DRUG SCREEN, HOSP PERFORMED
Amphetamines: NOT DETECTED
BARBITURATES: NOT DETECTED
Benzodiazepines: NOT DETECTED
Cocaine: NOT DETECTED
OPIATES: NOT DETECTED
TETRAHYDROCANNABINOL: POSITIVE — AB

## 2015-11-04 LAB — CBC
HEMATOCRIT: 44.2 % (ref 39.0–52.0)
HEMOGLOBIN: 15 g/dL (ref 13.0–17.0)
MCH: 30.8 pg (ref 26.0–34.0)
MCHC: 33.9 g/dL (ref 30.0–36.0)
MCV: 90.8 fL (ref 78.0–100.0)
Platelets: 187 10*3/uL (ref 150–400)
RBC: 4.87 MIL/uL (ref 4.22–5.81)
RDW: 13 % (ref 11.5–15.5)
WBC: 7.7 10*3/uL (ref 4.0–10.5)

## 2015-11-04 LAB — COMPREHENSIVE METABOLIC PANEL
ALBUMIN: 4.3 g/dL (ref 3.5–5.0)
ALT: 25 U/L (ref 17–63)
ANION GAP: 10 (ref 5–15)
AST: 26 U/L (ref 15–41)
Alkaline Phosphatase: 69 U/L (ref 38–126)
BUN: 11 mg/dL (ref 6–20)
CALCIUM: 9.9 mg/dL (ref 8.9–10.3)
CHLORIDE: 103 mmol/L (ref 101–111)
CO2: 26 mmol/L (ref 22–32)
CREATININE: 1.08 mg/dL (ref 0.61–1.24)
GFR calc Af Amer: >60 mL/min (ref >60)
GFR calc non Af Amer: >60 mL/min (ref >60)
GLUCOSE: 90 mg/dL (ref 65–99)
POTASSIUM: 3.9 mmol/L (ref 3.5–5.1)
Sodium: 139 mmol/L (ref 135–145)
Total Bilirubin: 0.8 mg/dL (ref 0.3–1.2)
Total Protein: 7.1 g/dL (ref 6.5–8.1)

## 2015-11-04 LAB — ACETAMINOPHEN LEVEL: Acetaminophen (Tylenol), Serum: <10 ug/mL — ABNORMAL LOW (ref 10–30)

## 2015-11-04 LAB — ETHANOL: Alcohol, Ethyl (B): 238 mg/dL — ABNORMAL HIGH (ref <5)

## 2015-11-04 LAB — SALICYLATE LEVEL

## 2015-11-04 MED ORDER — NICOTINE 21 MG/24HR TD PT24
21.0000 mg | MEDICATED_PATCH | Freq: Once | TRANSDERMAL | Status: DC
  Administered 2015-11-04: 21 mg via TRANSDERMAL
  Filled 2015-11-04: qty 1
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
## 2015-11-04 NOTE — ED Notes (Addendum)
Pt states he has felt suicidal since New Years Eve. Pt states he had 6 40oz beers today. Pt states he would like to stop drinking. Pt states he does not care what happens to him. Pt states his plan to kill himself would be to jump in front of traffic.

## 2015-11-04 NOTE — ED Provider Notes (Signed)
CSN: [id number]     Arrival date & time 11/04/15  1947 History   First MD Initiated Contact with Patient 11/04/15 2003     Chief Complaint  Patient presents with  . Alcohol Intoxication  . Suicidal     (Consider location/radiation/quality/duration/timing/severity/associated sxs/prior Treatment) HPI   Patient is a 60 year old male past medical history of anxiety, depression, COPD and alcoholism who presents to the ED with complaint of suicidal ideations. Patient states he has been feeling suicidal since New Year's Eve. He endorses having recent increased stress is causing her to have more feelings of suicide. Patient endorses having a plan to kill himself by jumping in front of traffic, denies any attempts. Patient reports to drinking 640 ounce beers earlier today. He states he drinks 2-3 times every week. Denies drug use. Denies fever, headache, cough, shortness of breath, chest pain, abdominal pain, N/V/D, urinary sxs, numbness, tingling, weakness, tremors, diaphoresis. Denies HI or hallucinations. Pt endorses taking prozac but deneis taking any medications today.   Past Medical History  Diagnosis Date  . Mental health disorder   . Scoliosis   . Anxiety   . Depression   . Hep C w/o coma, chronic (HCC)   . COPD (chronic obstructive pulmonary disease) (HCC)   . Alcoholism (HCC)   . Thyroid disease    Past Surgical History  Procedure Laterality Date  . Thyroid surgery    . Skin cancer excision     Family History  Problem Relation Age of Onset  . Dementia Mother    Social History  Substance Use Topics  . Smoking status: Current Every Day Smoker -- 0.50 packs/day for 40 years    Types: Cigarettes  . Smokeless tobacco: Never Used  . Alcohol Use: Yes     Comment: 4 40 oz beers per day    Review of Systems  Psychiatric/Behavioral: Positive for suicidal ideas.  All other systems reviewed and are negative.     Allergies  Review of patient's allergies indicates no known  allergies.  Home Medications   Prior to Admission medications   Medication Sig Start Date End Date Taking? Authorizing Provider  FLUoxetine (PROZAC) 20 MG capsule Take 1 capsule (20 mg total) by mouth daily. 07/16/15  Yes Adonis Brook, NP  gabapentin (NEURONTIN) 100 MG capsule Take 1 capsule (100 mg total) by mouth 3 (three) times daily. For anxiety symptoms 07/16/15  Yes Adonis Brook, NP  traZODone (DESYREL) 50 MG tablet Take 1 tablet (50 mg total) by mouth at bedtime. 07/16/15  Yes Adonis Brook, NP   BP 98/69 mmHg  Pulse 67  Temp(Src) 97.9 F (36.6 C) (Oral)  Resp 18  SpO2 94% Physical Exam  Constitutional: He is oriented to person, place, and time. He appears well-developed and well-nourished.  HENT:  Head: Normocephalic and atraumatic.  Mouth/Throat: Oropharynx is clear and moist. No oropharyngeal exudate.  Eyes: Conjunctivae and EOM are normal. Pupils are equal, round, and reactive to light. Right eye exhibits no discharge. Left eye exhibits no discharge. No scleral icterus.  Neck: Normal range of motion. Neck supple.  Cardiovascular: Normal rate, regular rhythm, normal heart sounds and intact distal pulses.   Pulmonary/Chest: Effort normal and breath sounds normal. No respiratory distress. He has no wheezes. He has no rales. He exhibits no tenderness.  Abdominal: Soft. Bowel sounds are normal. He exhibits no distension and no mass. There is no tenderness. There is no rebound and no guarding.  Musculoskeletal: Normal range of motion. He exhibits no edema  or tenderness.  Lymphadenopathy:    He has no cervical adenopathy.  Neurological: He is alert and oriented to person, place, and time.  Skin: Skin is warm and dry.  Psychiatric: He has a normal mood and affect. His speech is normal and behavior is normal. He expresses suicidal ideation. He expresses suicidal plans.  Nursing note and vitals reviewed.   ED Course  Procedures (including critical care time) Labs Review Labs  Reviewed  ETHANOL - Abnormal; Notable for the following:    Alcohol, Ethyl (B) 238 (*)    All other components within normal limits  ACETAMINOPHEN LEVEL - Abnormal; Notable for the following:    Acetaminophen (Tylenol), Serum <10 (*)    All other components within normal limits  URINE RAPID DRUG SCREEN, HOSP PERFORMED - Abnormal; Notable for the following:    Tetrahydrocannabinol POSITIVE (*)    All other components within normal limits  COMPREHENSIVE METABOLIC PANEL  SALICYLATE LEVEL  CBC    Imaging Review No results found. I have personally reviewed and evaluated these images and lab results as part of my medical decision-making.  Filed Vitals:   11/04/15 2003  BP: 98/69  Pulse: 67  Temp: 97.9 F (36.6 C)  Resp: 18     MDM   Final diagnoses:  Suicidal ideation    Patient presents with suicide ideation with plan, denies attempts. Patient denies any pain or complaints at this time. Denies HI or hallucinations. VSS. Exam otherwise unremarkable. EtOH 238. UDS positive for THC. Remaining labs unremarkable. Consult TTS. Behavioral Health evaluated patient and recommended inpatient treatment. Placement pending.    Satira Sark [place], [place] 11/05/15 0106  Benjiman Core, MD 11/07/15 [phone]

## 2015-11-05 ENCOUNTER — Inpatient Hospital Stay (HOSPITAL_COMMUNITY)
Admission: EM | Admit: 2015-11-05 | Discharge: 2015-11-11 | DRG: 897 | Disposition: A | Discharge status: Home / Self Care | Payer: Federal, State, Local not specified - Other | Source: Intra-hospital | Attending: Psychiatry | Admitting: Psychiatry

## 2015-11-05 ENCOUNTER — Encounter (HOSPITAL_COMMUNITY): Payer: Self-pay

## 2015-11-05 DIAGNOSIS — F17210 Nicotine dependence, cigarettes, uncomplicated: Secondary | ICD-10-CM | POA: Diagnosis present

## 2015-11-05 DIAGNOSIS — Z85828 Personal history of other malignant neoplasm of skin: Secondary | ICD-10-CM | POA: Diagnosis not present

## 2015-11-05 DIAGNOSIS — F1020 Alcohol dependence, uncomplicated: Secondary | ICD-10-CM | POA: Diagnosis not present

## 2015-11-05 DIAGNOSIS — J449 Chronic obstructive pulmonary disease, unspecified: Secondary | ICD-10-CM | POA: Diagnosis present

## 2015-11-05 DIAGNOSIS — B182 Chronic viral hepatitis C: Secondary | ICD-10-CM | POA: Diagnosis present

## 2015-11-05 DIAGNOSIS — G4700 Insomnia, unspecified: Secondary | ICD-10-CM | POA: Diagnosis present

## 2015-11-05 DIAGNOSIS — F411 Generalized anxiety disorder: Secondary | ICD-10-CM | POA: Diagnosis present

## 2015-11-05 DIAGNOSIS — M419 Scoliosis, unspecified: Secondary | ICD-10-CM | POA: Diagnosis present

## 2015-11-05 DIAGNOSIS — F331 Major depressive disorder, recurrent, moderate: Secondary | ICD-10-CM | POA: Diagnosis present

## 2015-11-05 DIAGNOSIS — F10220 Alcohol dependence with intoxication, uncomplicated: Secondary | ICD-10-CM | POA: Diagnosis present

## 2015-11-05 DIAGNOSIS — F410 Panic disorder [episodic paroxysmal anxiety] without agoraphobia: Secondary | ICD-10-CM | POA: Diagnosis present

## 2015-11-05 MED ORDER — FLUOXETINE HCL 20 MG PO CAPS
20.0000 mg | ORAL_CAPSULE | Freq: Every day | ORAL | Status: DC
  Administered 2015-11-05 – 2015-11-11 (×7): 20 mg via ORAL
  Filled 2015-11-05 (×3): qty 1
  Filled 2015-11-05: qty 7
  Filled 2015-11-05 (×5): qty 1
  Filled 2015-11-05: qty 7
  Filled 2015-11-05: qty 1

## 2015-11-05 MED ORDER — ACETAMINOPHEN 325 MG PO TABS
650.0000 mg | ORAL_TABLET | Freq: Four times a day (QID) | ORAL | Status: DC | PRN
  Administered 2015-11-07 – 2015-11-11 (×4): 650 mg via ORAL
  Filled 2015-11-05 (×4): qty 2

## 2015-11-05 MED ORDER — CHLORDIAZEPOXIDE HCL 25 MG PO CAPS: 25.0000 mg | ORAL_CAPSULE | Freq: Four times a day (QID) | ORAL | Status: AC | PRN

## 2015-11-05 MED ORDER — CHLORDIAZEPOXIDE HCL 25 MG PO CAPS
25.0000 mg | ORAL_CAPSULE | Freq: Four times a day (QID) | ORAL | Status: AC
  Administered 2015-11-05 (×4): 25 mg via ORAL
  Filled 2015-11-05 (×4): qty 1

## 2015-11-05 MED ORDER — ADULT MULTIVITAMIN W/MINERALS CH
1.0000 | ORAL_TABLET | Freq: Every day | ORAL | Status: DC
  Administered 2015-11-05 – 2015-11-11 (×7): 1 via ORAL
  Filled 2015-11-05 (×10): qty 1

## 2015-11-05 MED ORDER — ONDANSETRON 4 MG PO TBDP: 4.0000 mg | ORAL_TABLET | Freq: Four times a day (QID) | ORAL | Status: AC | PRN

## 2015-11-05 MED ORDER — THIAMINE HCL 100 MG/ML IJ SOLN
100.0000 mg | Freq: Once | INTRAMUSCULAR | Status: AC
  Administered 2015-11-05: 100 mg via INTRAMUSCULAR
  Filled 2015-11-05: qty 2

## 2015-11-05 MED ORDER — CHLORDIAZEPOXIDE HCL 25 MG PO CAPS
25.0000 mg | ORAL_CAPSULE | Freq: Three times a day (TID) | ORAL | Status: AC
  Administered 2015-11-06 (×3): 25 mg via ORAL
  Filled 2015-11-05 (×3): qty 1

## 2015-11-05 MED ORDER — ALUM & MAG HYDROXIDE-SIMETH 200-200-20 MG/5ML PO SUSP: 30.0000 mL | ORAL | Status: DC | PRN

## 2015-11-05 MED ORDER — CHLORDIAZEPOXIDE HCL 25 MG PO CAPS
25.0000 mg | ORAL_CAPSULE | ORAL | Status: AC
  Administered 2015-11-07 (×2): 25 mg via ORAL
  Filled 2015-11-05 (×2): qty 1

## 2015-11-05 MED ORDER — CHLORDIAZEPOXIDE HCL 25 MG PO CAPS
25.0000 mg | ORAL_CAPSULE | Freq: Every day | ORAL | Status: AC
  Administered 2015-11-08: 25 mg via ORAL
  Filled 2015-11-05: qty 1

## 2015-11-05 MED ORDER — LOPERAMIDE HCL 2 MG PO CAPS: 2.0000 mg | ORAL_CAPSULE | ORAL | Status: AC | PRN

## 2015-11-05 MED ORDER — VITAMIN B-1 100 MG PO TABS
100.0000 mg | ORAL_TABLET | Freq: Every day | ORAL | Status: DC
  Administered 2015-11-06 – 2015-11-11 (×6): 100 mg via ORAL
  Filled 2015-11-05 (×8): qty 1

## 2015-11-05 MED ORDER — MAGNESIUM HYDROXIDE 400 MG/5ML PO SUSP: 30.0000 mL | Freq: Every day | ORAL | Status: DC | PRN

## 2015-11-05 MED ORDER — HYDROXYZINE HCL 25 MG PO TABS
25.0000 mg | ORAL_TABLET | Freq: Four times a day (QID) | ORAL | Status: AC | PRN
  Administered 2015-11-07: 25 mg via ORAL
  Filled 2015-11-05: qty 1

## 2015-11-05 MED ORDER — TRAZODONE HCL 50 MG PO TABS
50.0000 mg | ORAL_TABLET | Freq: Every day | ORAL | Status: DC
  Administered 2015-11-05 – 2015-11-10 (×6): 50 mg via ORAL
  Filled 2015-11-05 (×2): qty 7
  Filled 2015-11-05 (×7): qty 1

## 2015-11-05 MED ORDER — GABAPENTIN 100 MG PO CAPS
100.0000 mg | ORAL_CAPSULE | Freq: Three times a day (TID) | ORAL | Status: DC
  Administered 2015-11-05 – 2015-11-09 (×14): 100 mg via ORAL
  Filled 2015-11-05 (×20): qty 1

## 2015-11-05 NOTE — Plan of Care (Signed)
Problem: Alteration in mood & ability to function due to Goal: LTG-Patient demonstrates decreased signs of withdrawal (Patient demonstrates decreased signs of withdrawal to the point the patient is safe to return home and continue treatment in an outpatient setting)  Outcome: Progressing Pt reported not having withdrawls

## 2015-11-05 NOTE — Progress Notes (Signed)
[facility] Group Notes:  (Nursing/MHT/Case Management/Adjunct)  Date:  11/05/2015  Time: 2045  Type of Therapy:  wrap up group  Participation Level:  Active  Participation Quality:  Appropriate, Attentive, Sharing and Supportive  Affect:  Appropriate  Cognitive:  Appropriate  Insight:  Improving  Engagement in Group:  Engaged  Modes of Intervention:  Clarification, Education and Support  Summary of Progress/Problems:  Tim [name] 11/05/2015, 9:40 PM

## 2015-11-05 NOTE — H&P (Signed)
Psychiatric Admission Assessment Adult  Patient Identification: Tim [name] MRN:  [id number] Date of Evaluation:  11/05/2015 Chief Complaint:  MDD recurrent severe Principal Diagnosis: <principal problem not specified> Diagnosis:   Patient Active Problem List   Diagnosis Date Noted  . Alcohol use disorder, severe, dependence ([place]) [F10.20] 11/05/2015  . Alcohol dependence with uncomplicated withdrawal ([place]) [F10.230] 07/15/2015  . Cellulitis of right lower extremity [L03.115] 03/29/2015  . Alcohol dependence ([place]) [F10.20] 03/05/2012   History of Present Illness:: 60 Y/O male who states he is here for "alcoholism." Started on a binge during the holidays. Drinks couple of 40's a day yesterday drank about 6. Quit crack a year ago. States he wants help. He felt like killing himself but states he is not feeling like hurting himself anymore. He wants help The initial assessment is as follows: Tim [name] is an 60 y.o. Male whocalled the police about feeling that he may kill himself. Pt says he may kill himself by stepping into traffic or "suicide by cop." Patient is loud voiced. He appears to talk loudly all the time because he was not particularly agitated and was generally pleasant to interact with. Patient has been anxious and depressed for the past few weeks. He says that he feels he is not contributing to society. He says other family member "help support me because of my mental health." Patient lives with mother currently. Patient says he never seems to be able to break this cycle of using ETOH and being depressed all the time. Patient drinks four 40's or a 6-pack per day. He smokes marijuana about "a couple of joints per month." Denies current withdrawal symptoms.Patient denies any HI or A/V hallucinations. Patient has been with [facility] for outpatient services since 2000 when [facility] provided service. Patient was at [facility] in 09/16, 06/14, 05/13, 08/12. Diagnosis: MDD  Associated  Signs/Symptoms: Depression Symptoms:  depressed mood, insomnia, fatigue, anxiety, panic attacks, loss of energy/fatigue, disturbed sleep, (Hypo) Manic Symptoms:  Irritable Mood, Labiality of Mood, Anxiety Symptoms:  Excessive Worry, Panic Symptoms, Psychotic Symptoms:  denies PTSD Symptoms: Denies  Total Time spent with patient: 45 minutes  Past Psychiatric History:   Risk to Self: Is patient at risk for suicide?: No Risk to Others:  No Prior Inpatient Therapy:  [facility] August last time Kristin Bruins Prior Outpatient Therapy:  [facility]   Alcohol Screening: Patient refused Alcohol Screening Tool: Yes 1. How often do you have a drink containing alcohol?: 4 or more times a week 2. How many drinks containing alcohol do you have on a typical day when you are drinking?: 5 or 6 3. How often do you have six or more drinks on one occasion?: Daily or almost daily Preliminary Score: 6 4. How often during the last year have you found that you were not able to stop drinking once you had started?: Monthly 5. How often during the last year have you failed to do what was normally expected from you becasue of drinking?: Monthly 6. How often during the last year have you needed a first drink in the morning to get yourself going after a heavy drinking session?: Monthly 7. How often during the last year have you had a feeling of guilt of remorse after drinking?: Monthly 8. How often during the last year have you been unable to remember what happened the night before because you had been drinking?: Less than monthly 9. Have you or someone else been injured as a result of your drinking?: No  10. Has a relative or friend or a doctor or another health worker been concerned about your drinking or suggested you cut down?: No Alcohol Use Disorder Identification Test Final Score (AUDIT): 19 Brief Intervention: Yes Substance Abuse History in the last 12 months:  Yes.   Consequences of Substance Abuse: Legal  Consequences:  few DWI's up to 5 years ago Withdrawal Symptoms:   Diaphoresis Diarrhea Nausea Tremors Previous Psychotropic Medications: Yes Prozac Neurontin Trazodone Psychological Evaluations: No  Past Medical History:  Past Medical History  Diagnosis Date  . Mental health disorder   . Scoliosis   . Anxiety   . Depression   . Hep C w/o coma, chronic ([place])   . COPD (chronic obstructive pulmonary disease) ([place])   . Alcoholism ([place])   . Thyroid disease     Past Surgical History  Procedure Laterality Date  . Thyroid surgery    . Skin cancer excision     Family History:  Family History  Problem Relation Age of Onset  . Dementia Mother    Family Psychiatric  History: father alcoholic niece alcohol  Social History:  History  Alcohol Use  . Yes    Comment: 4 40 oz beers per day     History  Drug Use  . Yes  . Special: Cocaine, Marijuana    Comment: also states will take pain pills every now and then, last use of cocaine and ETOH today at 1400    Social History   Social History  . Marital Status: Single    Spouse Name: N/A  . Number of Children: N/A  . Years of Education: N/A   Social History Main Topics  . Smoking status: Current Every Day Smoker -- 0.50 packs/day for 40 years    Types: Cigarettes  . Smokeless tobacco: Never Used  . Alcohol Use: Yes     Comment: 4 40 oz beers per day  . Drug Use: Yes    Special: Cocaine, Marijuana     Comment: also states will take pain pills every now and then, last use of cocaine and ETOH today at 1400  . Sexual Activity: No   Other Topics Concern  . 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 Single no children in no relationship  Additional Social History:    History of alcohol / drug use?: Yes Longest period of sobriety (when/how long): one day recently Negative Consequences of Use: Financial Withdrawal  Symptoms: DTs, Irritability, Tremors                    Allergies:  No Known Allergies Lab Results:  Results for orders placed or performed during the hospital encounter of 11/04/15 (from the past 48 hour(s))  Urine rapid drug screen (hosp performed) (Not at [facility])     Status: Abnormal   Collection Time: 11/04/15  8:09 PM  Result Value Ref Range   Opiates NONE DETECTED NONE DETECTED   Cocaine NONE DETECTED NONE DETECTED   Benzodiazepines NONE DETECTED NONE DETECTED   Amphetamines NONE DETECTED NONE DETECTED   Tetrahydrocannabinol POSITIVE (A) NONE DETECTED   Barbiturates NONE DETECTED NONE DETECTED    Comment:        DRUG SCREEN FOR MEDICAL PURPOSES ONLY.  IF CONFIRMATION IS NEEDED FOR ANY PURPOSE, NOTIFY LAB WITHIN 5 DAYS.        LOWEST DETECTABLE  LIMITS FOR URINE DRUG SCREEN Drug Class       Cutoff (ng/mL) Amphetamine      1000 Barbiturate      200 Benzodiazepine   929 Tricyclics       574 Opiates          300 Cocaine          300 THC              50   Comprehensive metabolic panel     Status: None   Collection Time: 11/04/15  8:39 PM  Result Value Ref Range   Sodium 139 135 - 145 mmol/L   Potassium 3.9 3.5 - 5.1 mmol/L   Chloride 103 101 - 111 mmol/L   CO2 26 22 - 32 mmol/L   Glucose, Bld 90 65 - 99 mg/dL   BUN 11 6 - 20 mg/dL   Creatinine, Ser 1.08 0.61 - 1.24 mg/dL   Calcium 9.9 8.9 - 10.3 mg/dL   Total Protein 7.1 6.5 - 8.1 g/dL   Albumin 4.3 3.5 - 5.0 g/dL   AST 26 15 - 41 U/L   ALT 25 17 - 63 U/L   Alkaline Phosphatase 69 38 - 126 U/L   Total Bilirubin 0.8 0.3 - 1.2 mg/dL   GFR calc non Af Amer >60 >60 mL/min   GFR calc Af Amer >60 >60 mL/min    Comment: (NOTE) The eGFR has been calculated using the CKD EPI equation. This calculation has not been validated in all clinical situations. eGFR's persistently <60 mL/min signify possible Chronic Kidney Disease.    Anion gap 10 5 - 15  CBC     Status: None   Collection Time: 11/04/15  8:39 PM  Result  Value Ref Range   WBC 7.7 4.0 - 10.5 K/uL   RBC 4.87 4.22 - 5.81 MIL/uL   Hemoglobin 15.0 13.0 - 17.0 g/dL   HCT 44.2 39.0 - 52.0 %   MCV 90.8 78.0 - 100.0 fL   MCH 30.8 26.0 - 34.0 pg   MCHC 33.9 30.0 - 36.0 g/dL   RDW 13.0 11.5 - 15.5 %   Platelets 187 150 - 400 K/uL  Ethanol (ETOH)     Status: Abnormal   Collection Time: 11/04/15  8:41 PM  Result Value Ref Range   Alcohol, Ethyl (B) 238 (H) <5 mg/dL    Comment:        LOWEST DETECTABLE LIMIT FOR SERUM ALCOHOL IS 5 mg/dL FOR MEDICAL PURPOSES ONLY   Salicylate level     Status: None   Collection Time: 11/04/15  8:41 PM  Result Value Ref Range   Salicylate Lvl <7.3 2.8 - 30.0 mg/dL  Acetaminophen level     Status: Abnormal   Collection Time: 11/04/15  8:41 PM  Result Value Ref Range   Acetaminophen (Tylenol), Serum <10 (L) 10 - 30 ug/mL    Comment:        THERAPEUTIC CONCENTRATIONS VARY SIGNIFICANTLY. A RANGE OF 10-30 ug/mL MAY BE AN EFFECTIVE CONCENTRATION FOR MANY PATIENTS. HOWEVER, SOME ARE BEST TREATED AT CONCENTRATIONS OUTSIDE THIS RANGE. ACETAMINOPHEN CONCENTRATIONS >150 ug/mL AT 4 HOURS AFTER INGESTION AND >50 ug/mL AT 12 HOURS AFTER INGESTION ARE OFTEN ASSOCIATED WITH TOXIC REACTIONS.     Metabolic Disorder Labs:  No results found for: HGBA1C, MPG No results found for: PROLACTIN No results found for: CHOL, TRIG, HDL, CHOLHDL, VLDL, LDLCALC  Current Medications: Current Facility-Administered Medications  Medication Dose Route Frequency Provider Last Rate Last  Dose  . acetaminophen (TYLENOL) tablet 650 mg  650 mg Oral Q6H PRN Encarnacion Slates, NP      . alum & mag hydroxide-simeth (MAALOX/MYLANTA) 200-200-20 MG/5ML suspension 30 mL  30 mL Oral Q4H PRN Encarnacion Slates, NP      . chlordiazePOXIDE (LIBRIUM) capsule 25 mg  25 mg Oral Q6H PRN Encarnacion Slates, NP      . chlordiazePOXIDE (LIBRIUM) capsule 25 mg  25 mg Oral QID Encarnacion Slates, NP   25 mg at 11/05/15 5498   Followed by  . [START ON 11/06/2015]  chlordiazePOXIDE (LIBRIUM) capsule 25 mg  25 mg Oral TID Encarnacion Slates, NP       Followed by  . [START ON 11/07/2015] chlordiazePOXIDE (LIBRIUM) capsule 25 mg  25 mg Oral BH-qamhs Encarnacion Slates, NP       Followed by  . [START ON 11/08/2015] chlordiazePOXIDE (LIBRIUM) capsule 25 mg  25 mg Oral Daily Encarnacion Slates, NP      . FLUoxetine (PROZAC) capsule 20 mg  20 mg Oral Daily Encarnacion Slates, NP   20 mg at 11/05/15 0952  . gabapentin (NEURONTIN) capsule 100 mg  100 mg Oral TID Encarnacion Slates, NP   100 mg at 11/05/15 0953  . hydrOXYzine (ATARAX/VISTARIL) tablet 25 mg  25 mg Oral Q6H PRN Encarnacion Slates, NP      . loperamide (IMODIUM) capsule 2-4 mg  2-4 mg Oral PRN Encarnacion Slates, NP      . magnesium hydroxide (MILK OF MAGNESIA) suspension 30 mL  30 mL Oral Daily PRN Encarnacion Slates, NP      . multivitamin with minerals tablet 1 tablet  1 tablet Oral Daily Encarnacion Slates, NP   1 tablet at 11/05/15 0955  . ondansetron (ZOFRAN-ODT) disintegrating tablet 4 mg  4 mg Oral Q6H PRN Encarnacion Slates, NP      . Derrill Memo ON 11/06/2015] thiamine (VITAMIN B-1) tablet 100 mg  100 mg Oral Daily Encarnacion Slates, NP      . traZODone (DESYREL) tablet 50 mg  50 mg Oral QHS Encarnacion Slates, NP       PTA Medications: Prescriptions prior to admission  Medication Sig Dispense Refill Last Dose  . FLUoxetine (PROZAC) 20 MG capsule Take 1 capsule (20 mg total) by mouth daily. 30 capsule 0 11/04/2015 at Unknown time  . gabapentin (NEURONTIN) 100 MG capsule Take 1 capsule (100 mg total) by mouth 3 (three) times daily. For anxiety symptoms 90 capsule 0 Past Week at Unknown time  . traZODone (DESYREL) 50 MG tablet Take 1 tablet (50 mg total) by mouth at bedtime. 7 tablet 0 11/03/2015 at Unknown time    Musculoskeletal: Strength & Muscle Tone: within normal limits Gait & Station: normal Patient leans: normal  Psychiatric Specialty Exam: Physical Exam  Review of Systems  Constitutional: Positive for malaise/fatigue.  HENT: Negative.    Eyes: Positive for blurred vision.  Respiratory: Positive for cough and shortness of breath.        10 a day has COPD  Cardiovascular: Negative.   Gastrointestinal: Positive for heartburn.  Genitourinary: Negative.   Musculoskeletal: Positive for joint pain.  Skin: Negative.   Neurological: Positive for weakness.  Endo/Heme/Allergies: Negative.   Psychiatric/Behavioral: Positive for depression and substance abuse. The patient is nervous/anxious and has insomnia.     Blood pressure 113/76, pulse 82, temperature 97.7 F (36.5 C), temperature source Oral, resp. rate 18, height  6' 2"  (1.88 m), weight 82.101 kg (181 lb).Body mass index is 23.23 kg/(m^2).  General Appearance: Fairly Groomed  Engineer, water::  Fair  Speech:  Clear and Coherent  Volume:  fluctuates  Mood:  Anxious and Depressed  Affect:  Restricted  Thought Process:  Coherent and Goal Directed  Orientation:  Full (Time, Place, and Person)  Thought Content:  symptoms events worries concerns  Suicidal Thoughts:  No  Homicidal Thoughts:  No  Memory:  Immediate;   Fair Recent;   Fair Remote;   Fair  Judgement:  Fair  Insight:  Present and Shallow  Psychomotor Activity:  Restlessness  Concentration:  Fair  Recall:  [employer] of Knowledge:Fair  Language: Fair  Akathisia:  No  Handed:  Right  AIMS (if indicated):     Assets:  Desire for Improvement Housing Social Support  ADL's:  Intact  Cognition: WNL  Sleep:        Treatment Plan Summary: Daily contact with patient to assess and evaluate symptoms and progress in treatment and Medication management Supportive approach/coping skills Alcohol dependence; Librium detox protocol/work a relapse prevention plan Depression; continue the Prozac 20 mg and optimize response Anxiety: continue the Neurontin 100 mg TID and optimize response Work with CBT/mindfulness Explore residential treatment options Observation Level/Precautions:  15 minute checks  Laboratory:  As per  the ED  Psychotherapy:  Individual/group  Medications:  Librium detox protocol  Consultations:    Discharge Concerns:  Need for residential treatment  Estimated LOS: 3-5 days  Other:     I certify that inpatient services furnished can reasonably be expected to improve the patient's condition.   [place] A 1/19/201710:04 AM

## 2015-11-05 NOTE — Tx Team (Signed)
Initial Interdisciplinary Treatment Plan   PATIENT STRESSORS: Financial difficulties Health problems Substance abuse   PATIENT STRENGTHS: Ability for insight Communication skills Motivation for treatment/growth   PROBLEM LIST: Problem List/Patient Goals Date to be addressed Date deferred Reason deferred Estimated date of resolution  "Alcohol detox with long term to quit " 11/05/15     "Work on self esteem." 11/05/15     Substance abuse 11/05/15     Depression 11/05/15                                    DISCHARGE CRITERIA:  Ability to meet basic life and health needs Adequate post-discharge living arrangements Improved stabilization in mood, thinking, and/or behavior  PRELIMINARY DISCHARGE PLAN: Attend aftercare/continuing care group Attend PHP/IOP Attend 12-step recovery group  PATIENT/FAMIILY INVOLVEMENT: This treatment plan has been presented to and reviewed with the patient, POPE BRUNTY, and/or family member.  The patient and family have been given the opportunity to ask questions and make suggestions.  Bethann Punches 11/05/2015, 9:41 AM

## 2015-11-05 NOTE — ED Notes (Signed)
Report given to [facility]

## 2015-11-05 NOTE — Progress Notes (Signed)
Pt present with depressed mood and flat effect. Pt has been in the room most this am sleeping. Pt complained of general tiredness. Pt reported having had fair sleep, fair appetite, normal energy, and good concentration. Pt rates his depression at 1, anxiety at a 5, and hopelessness at a 3. Pt goal is " working on myself, increasing self esteem." Pt's safety ensured with 15 minute and environmental checks. Pt currently denies SI/HI and A/V hallucinations. Pt verbally agrees to seek staff if SI/HI or A/VH occurs and to consult with staff before acting on these thoughts. Will continue POC.

## 2015-11-05 NOTE — ED Notes (Signed)
Pt has been seen and wanded by security.  Pt's belongings are in  [place] [address].

## 2015-11-05 NOTE — [facility] Suicide Risk Assessment (Signed)
[facility] INPATIENT:  Family/Significant Other Suicide Prevention Education  Suicide Prevention Education:  Education Completed; Satya Buttram (pt's mother) [phone] has been identified by the patient as the family member/significant other with whom the patient will be residing, and identified as the person(s) who will aid the patient in the event of a mental health crisis (suicidal ideations/suicide attempt).  With written consent from the patient, the family member/significant other has been provided the following suicide prevention education, prior to the and/or following the discharge of the patient.  The suicide prevention education provided includes the following:  Suicide risk factors  Suicide prevention and interventions  National Suicide Hotline telephone number  [facility] assessment telephone number  [facility] Emergency Assistance 911  [facility] and/or Residential Mobile Crisis Unit telephone number  Request made of family/significant other to:  Remove weapons (e.g., guns, rifles, knives), all items previously/currently identified as safety concern.    Remove drugs/medications (over-the-counter, prescriptions, illicit drugs), all items previously/currently identified as a safety concern.  The family member/significant other verbalizes understanding of the suicide prevention education information provided.  The family member/significant other agrees to remove the items of safety concern listed above.  Smart, Antonin Meininger LCSW 11/05/2015, 3:54 PM

## 2015-11-05 NOTE — Progress Notes (Signed)
Admission note:  Tim [name] admitted at 06:00 from [facility].  61 yo WM.  Diagnosis MDD recurrent, severe; ETOH use d/o severe. Pt. Called police stated he feels he may kill himself by stepping into traffic.  Pt. currently denies urge to SI.  Verbally contracts for safety.  Patient has been anxious and depressed for the past few weeks. He says that he feels he is not contributing to society.  Patient lives with mother currently. Patient says he never seems to be able to break this cycle of using ETOH and being depressed all the time. Patient drinks four 40's or a 6-pack per day. He smokes marijuana about "a couple of joints per month." Denies current withdrawal symptoms. Patient denies any HI or A/V hallucinations. Patient has been with [facility] for outpatient services since 2000.

## 2015-11-05 NOTE — [facility] Group Notes (Signed)
[facility] LCSW Group Therapy  11/05/2015 12:52 PM  Type of Therapy:  Group Therapy  Participation Level:  Active  Participation Quality:  Attentive  Affect:  Appropriate  Cognitive:  Alert and Oriented  Insight:  Engaged  Engagement in Therapy:  Engaged  Modes of Intervention:  Confrontation, Discussion, Education, Exploration, Problem-solving, Rapport Building, Socialization and Support  Summary of Progress/Problems:  Finding Balance in Life. Today's group focused on defining balance in one's own words, identifying things that can knock one off balance, and exploring healthy ways to maintain balance in life. Group members were asked to provide an example of a time when they felt off balance, describe how they handled that situation,and process healthier ways to regain balance in the future. Group members were asked to share the most important tool for maintaining balance that they learned while at [facility] and how they plan to apply this method after discharge. Tim [name] was attentive and engaged during today's processing group. He shared that he feels like he is moving toward balance "now that I came and am getting help for my drinking." "I started drinking around the holidays and it's gotten really out of control." Tim [name] reported that he has a lot of support from his mother and is hopeful about a new job that he is starting soon. He continues to make progress in the group setting with improving insight.   Smart, Merina Behrendt LCSW 11/05/2015, 12:52 PM
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
## 2015-11-05 NOTE — [facility] Counselor (Signed)
This writer faxed out supporting documentation to the following facilities for placement of patient: [facility]  [place] Provencio Mc[place], [place] 

## 2015-11-05 NOTE — [facility] Suicide Risk Assessment (Signed)
[facility] Admission Suicide Risk Assessment   Nursing information obtained from:    Demographic factors:    Current Mental Status:    Loss Factors:    Historical Factors:    Risk Reduction Factors:     Total Time spent with patient: 45 minutes Principal Problem: Alcohol use disorder, severe, dependence (HCC) Diagnosis:   Patient Active Problem List   Diagnosis Date Noted  . Alcohol use disorder, severe, dependence (HCC) [F10.20] 11/05/2015  . 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 Alcohol dependence with uncomplicated withdrawal (HCC) [F10.230] 07/15/2015  . Cellulitis of right lower extremity [L03.115] 03/29/2015  . Alcohol dependence (HCC) [F10.20] 03/05/2012   Subjective Data: see admission H and P  Continued Clinical Symptoms:  Alcohol Use Disorder Identification Test Final Score (AUDIT): 19 The "Alcohol Use Disorders Identification Test", Guidelines for Use in Primary Care, Second Edition.  World Science writer [facility]). Score between 0-7:  no or low risk or alcohol related problems. Score between 8-15:  moderate risk of alcohol related problems. Score between 16-19:  high risk of alcohol related problems. Score 20 or above:  warrants further diagnostic evaluation for alcohol dependence and treatment.   CLINICAL FACTORS:   Depression:   Comorbid alcohol abuse/dependence Alcohol/Substance Abuse/Dependencies   Psychiatric Specialty Exam: ROS  Blood pressure 119/68, pulse 69, temperature 97.7 F (36.5 C), temperature source Oral, resp. rate 18, height  (1.88 m), weight 82.101 kg (181 lb).Body mass index is 23.23 kg/(m^2).   COGNITIVE FEATURES THAT CONTRIBUTE TO RISK:  Closed-mindedness, Polarized thinking and Thought constriction (tunnel vision)    SUICIDE RISK:   Moderate:  Frequent suicidal ideation with limited intensity, and duration, some specificity in terms of plans, no associated intent, good self-control, limited  dysphoria/symptomatology, some risk factors present, and identifiable protective factors, including available and accessible social support.  PLAN OF CARE: see admission H and P  I certify that inpatient services furnished can reasonably be expected to improve the patient's condition.   Rachael Fee, MD 11/05/2015, 3:45 PM

## 2015-11-05 NOTE — Progress Notes (Signed)
Patient ID: Tim [name], male   DOB: 1956/08/22, 60 y.o.   MRN: [id number] D: Client in dayroom watching TV, appears relaxed. "got involved in the spirits during the holiday" Client reports "been one of the best days I had in a while" "I want to get detoxed, so I can get back to work" "I got a lot of job offers coming up don't need to get back in that shape" A: Clinical research associate provided emotional support, encouraged client to continue group and medications. Medications,reviewed and administered as ordered. Staff will monitor q61min for safety. R: Client is safe on the unit, attended group.

## 2015-11-05 NOTE — ED Notes (Signed)
[facility] called for transportation.  

## 2015-11-05 NOTE — [facility] Group Notes (Signed)
[facility] Group Notes:  (Nursing/MHT/Case Management/Adjunct)  Date:  11/05/2015  Time:  2:07 PM   Type of Therapy:  Psychoeducational Skills  Participation Level:  Did Not Attend  Participation Quality:  DID NOT ATTEND  Affect:  DID NOT ATTEND  Cognitive:  DID NOT ATTEND  Insight:  None  Engagement in Group:  DID NOT ATTEND  Modes of Intervention:  DID NOT ATTEND  Summary of Progress/Problems: Pt did not attend patient self inventory group.  Bethann Punches 11/05/2015, 2:07 PM

## 2015-11-05 NOTE — Tx Team (Signed)
Interdisciplinary Treatment Plan Update (Adult)  Date:  11/05/2015  Time Reviewed:  8:30 AM   Progress in Treatment: Attending groups: No. new to unit. Continuing to assess.  Participating in groups:  No. Taking medication as prescribed:  Yes. Tolerating medication:  Yes. Family/Significant othe contact made:  SPE required for this pt.  Patient understands diagnosis:  Yes. and As evidenced by:  seeking treatment for SI, depression, ETOH abuse, medication stabilization. Discussing patient identified problems/goals with staff:  Yes. Medical problems stabilized or resolved:  Yes. Denies suicidal/homicidal ideation: Yes. Issues/concerns per patient self-inventory:  Other:  Discharge Plan or Barriers: Pt lives with his mother and follows up at [facility] for mental health outpatient services. CSW assessing for appropriate referrals.   Reason for Continuation of Hospitalization: Depression Medication stabilization Suicidal ideation Withdrawal symptoms  Comments:  Tim [name] is an 60 y.o. male. Pt called police about feeling that he may kill himself. Pt says he may kill himself by stepping into traffic or "suicide by cop."Patient is loud voiced. He appears to talk loudly all the time because he was not particularly agitated and was generally pleasant to interact with. Patient has been anxious and depressed for the past few weeks. He says that he feels he is not contributing to society. He says other family member "help support me because of my mental health." Patient lives with mother currently. Patient says he never seems to be able to break this cycle of using ETOH and being depressed all the time.Patient drinks four 40's or a 6-pack per day. He smokes marijuana about "a couple of joints per month." Denies current withdrawal symptoms.Patient denies any HI or A/V hallucinations.[facility] has been with [facility] when [facility]. Patient was  at [facility] in 09/16, 06/14, 05/13, 08/12. Diagnosis: MDD recurrent, severe; ETOH use d/o severe  Estimated length of stay:  3-5 days   New goal(s): to develop effective aftercare plan.   Additional Comments:  Patient and CSW reviewed pt's identified goals and treatment plan. Patient verbalized understanding and agreed to treatment plan. CSW reviewed [facility] "Discharge Process and Patient Involvement" Form. Pt verbalized understanding of information provided and signed form.    Review of initial/current patient goals per problem list:  1. Goal(s): Patient will participate in aftercare plan  Met: Goal progressing.   Target date: at discharge  As evidenced by: Patient will participate within aftercare plan AEB aftercare provider and housing plan at discharge being identified.  1/19: Pt lives with his mother and is current with [facility] services. CSW assessing.   2. Goal (s): Patient will exhibit decreased depressive symptoms and suicidal ideations.  Met: No.    Target date: at discharge  As evidenced by: Patient will utilize self rating of depression at 3 or below and demonstrate decreased signs of depression or be deemed stable for discharge by MD.  1/19: Pt reports high depression. No SI/HI/AVH today.   3. Goal(s): Patient will demonstrate decreased signs of withdrawal due to substance abuse  QBV:QXIH progressing.   Target date:at discharge   As evidenced by: Patient will produce a CIWA/COWS score of 0, have stable vitals signs, and no symptoms of withdrawal.  1/19: Pt reports no signs of withdrawal with stable vitals and CIWA score of 1.    Attendees: Patient:   11/05/2015 8:30 AM   Family:   11/05/2015 8:30 AM   Physician:  Dr. Carlton Adam, MD 11/05/2015 8:30 AM   Nursing:   Chrys Racer,  Patrice RN 11/05/2015 8:30 AM   Clinical Social Worker: [place], LCSW 11/05/2015 8:30 AM   Clinical Social Worker: Erasmo Downer Drinkard LCSWA; Peri Maris LCSWA 11/05/2015 8:30 AM    Other:  Gerline Legacy Nurse Case Manager 11/05/2015 8:30 AM   Other:  Agustina Caroli NP 11/05/2015 8:30 AM   Other:   11/05/2015 8:30 AM   Other:  11/05/2015 8:30 AM   Other:  11/05/2015 8:30 AM   Other:  11/05/2015 8:30 AM    11/05/2015 8:30 AM    11/05/2015 8:30 AM    11/05/2015 8:30 AM    11/05/2015 8:30 AM    Scribe for Treatment Team:   Maxie Better, LCSW 11/05/2015 8:30 AM

## 2015-11-06 NOTE — Progress Notes (Signed)
[facility] MD Progress Note  11/06/2015 11:06 AM Tim [name]  MRN:  [id number] Subjective:  Tim [name] states he is trying to get his life back together. He endorses he realizes he needs to quit drinking. States he is committed to do that. He states he has been encouraged to seek disability but he continues to try to make it. He claims compliance with his medications before he came here Principal Problem: Alcohol use disorder, severe, dependence ([place]) Diagnosis:   Patient Active Problem List   Diagnosis Date Noted  . Alcohol use disorder, severe, dependence ([place]) [F10.20] 11/05/2015  . Depression, major, recurrent, moderate ([place]) [F33.1] 11/05/2015  . GAD (generalized anxiety disorder) [F41.1] 11/05/2015  . Alcohol dependence with uncomplicated withdrawal ([place]) [F10.230] 07/15/2015  . Cellulitis of right lower extremity [L03.115] 03/29/2015  . Alcohol dependence ([place]) [F10.20] 03/05/2012   Total Time spent with patient: 20 minutes  Past Psychiatric History: denies  Past Medical History:  Past Medical History  Diagnosis Date  . Mental health disorder   . Scoliosis   . Anxiety   . Depression   . Hep C w/o coma, chronic ([place])   . COPD (chronic obstructive pulmonary disease) ([place])   . Alcoholism ([place])   . Thyroid disease     Past Surgical History  Procedure Laterality Date  . Thyroid surgery    . Skin cancer excision     Family History:  Family History  Problem Relation Age of Onset  . Dementia Mother    Family Psychiatric  History: see admission H and P Social History:  History  Alcohol Use  . Yes    Comment: 4 40 oz beers per day     History  Drug Use  . Yes  . Special: Cocaine, Marijuana    Comment: also states will take pain pills every now and then, last use of cocaine and ETOH today at 1400    Social History   Social History  . Marital Status: Single    Spouse Name: N/A  . Number of Children: N/A  . Years of Education: N/A   Social History Main Topics  .  Smoking status: Current Every Day Smoker -- 0.50 packs/day for 40 years    Types: Cigarettes  . Smokeless tobacco: Never Used  . Alcohol Use: Yes     Comment: 4 40 oz beers per day  . Drug Use: Yes    Special: Cocaine, Marijuana     Comment: also states will take pain pills every now and then, last use of cocaine and ETOH today at 1400  . Sexual Activity: No   Other Topics Concern  . None   Social History Narrative   ** Merged History Encounter **       Additional Social History:    History of alcohol / drug use?: Yes Longest period of sobriety (when/how long): one day recently Negative Consequences of Use: Financial Withdrawal Symptoms: DTs, Irritability, Tremors                    Sleep: Fair  Appetite:  Fair  Current Medications: Current Facility-Administered Medications  Medication Dose Route Frequency Provider Last Rate Last Dose  . acetaminophen (TYLENOL) tablet 650 mg  650 mg Oral Q6H PRN Encarnacion Slates, NP      . alum & mag hydroxide-simeth (MAALOX/MYLANTA) 200-200-20 MG/5ML suspension 30 mL  30 mL Oral Q4H PRN Encarnacion Slates, NP      . chlordiazePOXIDE (LIBRIUM) capsule 25 mg  25 mg Oral Q6H PRN Encarnacion Slates, NP      . chlordiazePOXIDE (LIBRIUM) capsule 25 mg  25 mg Oral TID Encarnacion Slates, NP   25 mg at 11/06/15 0836   Followed by  . [START ON 11/07/2015] chlordiazePOXIDE (LIBRIUM) capsule 25 mg  25 mg Oral BH-qamhs Encarnacion Slates, NP       Followed by  . [START ON 11/08/2015] chlordiazePOXIDE (LIBRIUM) capsule 25 mg  25 mg Oral Daily Encarnacion Slates, NP      . FLUoxetine (PROZAC) capsule 20 mg  20 mg Oral Daily Encarnacion Slates, NP   20 mg at 11/06/15 0836  . gabapentin (NEURONTIN) capsule 100 mg  100 mg Oral TID Encarnacion Slates, NP   100 mg at 11/06/15 7564  . hydrOXYzine (ATARAX/VISTARIL) tablet 25 mg  25 mg Oral Q6H PRN Encarnacion Slates, NP      . loperamide (IMODIUM) capsule 2-4 mg  2-4 mg Oral PRN Encarnacion Slates, NP      . magnesium hydroxide (MILK OF MAGNESIA)  suspension 30 mL  30 mL Oral Daily PRN Encarnacion Slates, NP      . multivitamin with minerals tablet 1 tablet  1 tablet Oral Daily Encarnacion Slates, NP   1 tablet at 11/06/15 [phone]  . ondansetron (ZOFRAN-ODT) disintegrating tablet 4 mg  4 mg Oral Q6H PRN Encarnacion Slates, NP      . thiamine (VITAMIN B-1) tablet 100 mg  100 mg Oral Daily Encarnacion Slates, NP   100 mg at 11/06/15 0837  . traZODone (DESYREL) tablet 50 mg  50 mg Oral QHS Encarnacion Slates, NP   50 mg at 11/05/15 2200    Lab Results:  Results for orders placed or performed during the hospital encounter of 11/04/15 (from the past 48 hour(s))  Urine rapid drug screen (hosp performed) (Not at [facility])     Status: Abnormal   Collection Time: 11/04/15  8:09 PM  Result Value Ref Range   Opiates NONE DETECTED NONE DETECTED   Cocaine NONE DETECTED NONE DETECTED   Benzodiazepines NONE DETECTED NONE DETECTED   Amphetamines NONE DETECTED NONE DETECTED   Tetrahydrocannabinol POSITIVE (A) NONE DETECTED   Barbiturates NONE DETECTED NONE DETECTED    Comment:        DRUG SCREEN FOR MEDICAL PURPOSES ONLY.  IF CONFIRMATION IS NEEDED FOR ANY PURPOSE, NOTIFY LAB WITHIN 5 DAYS.        LOWEST DETECTABLE LIMITS FOR URINE DRUG SCREEN Drug Class       Cutoff (ng/mL) Amphetamine      1000 Barbiturate      200 Benzodiazepine   518 Tricyclics       841 Opiates          300 Cocaine          300 THC              50   Comprehensive metabolic panel     Status: None   Collection Time: 11/04/15  8:39 PM  Result Value Ref Range   Sodium 139 135 - 145 mmol/L   Potassium 3.9 3.5 - 5.1 mmol/L   Chloride 103 101 - 111 mmol/L   CO2 26 22 - 32 mmol/L   Glucose, Bld 90 65 - 99 mg/dL   BUN 11 6 - 20 mg/dL   Creatinine, Ser 1.08 0.61 - 1.24 mg/dL   Calcium 9.9 8.9 - 10.3 mg/dL   Total Protein  7.1 6.5 - 8.1 g/dL   Albumin 4.3 3.5 - 5.0 g/dL   AST 26 15 - 41 U/L   ALT 25 17 - 63 U/L   Alkaline Phosphatase 69 38 - 126 U/L   Total Bilirubin 0.8 0.3 - 1.2 mg/dL   GFR  calc non Af Amer >60 >60 mL/min   GFR calc Af Amer >60 >60 mL/min    Comment: (NOTE) The eGFR has been calculated using the CKD EPI equation. This calculation has not been validated in all clinical situations. eGFR's persistently <60 mL/min signify possible Chronic Kidney Disease.    Anion gap 10 5 - 15  CBC     Status: None   Collection Time: 11/04/15  8:39 PM  Result Value Ref Range   WBC 7.7 4.0 - 10.5 K/uL   RBC 4.87 4.22 - 5.81 MIL/uL   Hemoglobin 15.0 13.0 - 17.0 g/dL   HCT 44.2 39.0 - 52.0 %   MCV 90.8 78.0 - 100.0 fL   MCH 30.8 26.0 - 34.0 pg   MCHC 33.9 30.0 - 36.0 g/dL   RDW 13.0 11.5 - 15.5 %   Platelets 187 150 - 400 K/uL  Ethanol (ETOH)     Status: Abnormal   Collection Time: 11/04/15  8:41 PM  Result Value Ref Range   Alcohol, Ethyl (B) 238 (H) <5 mg/dL    Comment:        LOWEST DETECTABLE LIMIT FOR SERUM ALCOHOL IS 5 mg/dL FOR MEDICAL PURPOSES ONLY   Salicylate level     Status: None   Collection Time: 11/04/15  8:41 PM  Result Value Ref Range   Salicylate Lvl <0.0 2.8 - 30.0 mg/dL  Acetaminophen level     Status: Abnormal   Collection Time: 11/04/15  8:41 PM  Result Value Ref Range   Acetaminophen (Tylenol), Serum <10 (L) 10 - 30 ug/mL    Comment:        THERAPEUTIC CONCENTRATIONS VARY SIGNIFICANTLY. A RANGE OF 10-30 ug/mL MAY BE AN EFFECTIVE CONCENTRATION FOR MANY PATIENTS. HOWEVER, SOME ARE BEST TREATED AT CONCENTRATIONS OUTSIDE THIS RANGE. ACETAMINOPHEN CONCENTRATIONS >150 ug/mL AT 4 HOURS AFTER INGESTION AND >50 ug/mL AT 12 HOURS AFTER INGESTION ARE OFTEN ASSOCIATED WITH TOXIC REACTIONS.     Physical Findings: AIMS:  , ,  ,  ,    CIWA:  CIWA-Ar Total: 0 COWS:     Musculoskeletal: Strength & Muscle Tone: within normal limits Gait & Station: normal Patient leans: normal  Psychiatric Specialty Exam: Review of Systems  Constitutional: Negative.   HENT: Negative.   Eyes: Negative.   Respiratory: Negative.   Cardiovascular:  Negative.   Gastrointestinal: Negative.   Genitourinary: Negative.   Musculoskeletal: Negative.   Skin: Negative.   Neurological: Negative.   Endo/Heme/Allergies: Negative.   Psychiatric/Behavioral: Positive for depression and substance abuse. The patient is nervous/anxious.     Blood pressure 117/80, pulse 74, temperature 98.2 F (36.8 C), temperature source Oral, resp. rate 16, height _0  (1.88 m), weight 82.101 kg (181 lb).Body mass index is 23.23 kg/(m^2).  General Appearance: Fairly Groomed  Engineer, water::  Fair  Speech:  Clear and Coherent  Volume:  Normal  Mood:  Anxious and Depressed  Affect:  anxious worried  Thought Process:  Coherent and Goal Directed  Orientation:  Full (Time, Place, and Person)  Thought Content:  symptoms events worries concerns  Suicidal Thoughts:  No  Homicidal Thoughts:  No  Memory:  Immediate;   Fair Recent;  Fair Remote;   Fair  Judgement:  Fair  Insight:  Present and Shallow  Psychomotor Activity:  Restlessness  Concentration:  Fair  Recall:  [employer] of Knowledge:Fair  Language: Fair  Akathisia:  No  Handed:  Right  AIMS (if indicated):     Assets:  Desire for Improvement Housing  ADL's:  Intact  Cognition: WNL  Sleep:  Number of Hours: 6.5   Treatment Plan Summary: Daily contact with patient to assess and evaluate symptoms and progress in treatment and Medication management Supportive approach/coping skills Alcohol dependence; continue the Librium detox protocol/work a relapse prevention plan Depression; pursue the Prozac 20 mg daily Anxiety-agitation; pursue the Neurontin 100 mg optimize dose-response Work with CBT/mindfulness Kagan Mutchler A 11/06/2015, 11:06 AM

## 2015-11-06 NOTE — [facility] Group Notes (Signed)
[facility] LCSW Aftercare Discharge Planning Group Note   11/06/2015 10:27 AM  Participation Quality:  Appropriate  Mood/Affect:  Appropriate  Depression Rating:  0  Anxiety Rating:  0  Thoughts of Suicide:  No Will you contract for safety?   NA  Current AVH:  No  Plan for Discharge/Comments:  Pt reports minimal withdrawals today and is comfortable with discharging home to his mother on Monday. He plans to return to [facility] for med management and refused referral to inpatient treatment. Pt reports good sleep. Pleasant and calm during group.   Transportation Means: taxi? Unknown at this time.   Supports: mother; sister  Smart, Herbert Seta LCSW

## 2015-11-06 NOTE — [facility] Group Notes (Signed)
[facility] LCSW Group Therapy  11/06/2015 3:34 PM  Type of Therapy:  Group Therapy  Participation Level:  Active  Participation Quality:  Attentive  Affect:  Appropriate  Cognitive:  Alert and Oriented  Insight:  Engaged  Engagement in Therapy:  Engaged  Modes of Intervention:  Confrontation, Discussion, Education, Exploration, Problem-solving, Rapport Building, Socialization and Support  Summary of Progress/Problems: Feelings around Relapse. Group members discussed the meaning of relapse and shared personal stories of relapse, how it affected them and others, and how they perceived themselves during this time. Group members were encouraged to identify triggers, warning signs and coping skills used when facing the possibility of relapse. Social supports were discussed and explored in detail. Post Acute Withdrawal Syndrome (handout provided) was introduced and examined. Pt's were encouraged to ask questions, talk about key points associated with PAWS, and process this information in terms of relapse prevention. Tim [name] was attentive and engaged during today's processing group. He shared that his relapse occurred because "I was around the wrong people and just feeling down about myself. I made an excuse to pick up that first beer and I lost control." Tim [name] shared that his ultimate goal is to "become a productive member of society and to stay away from alcohol."   Smart, Tim Winiarski LCSW 11/06/2015, 3:34 PM

## 2015-11-06 NOTE — Progress Notes (Signed)
D Tim [name] is OOB UAL on the [facility] hall today. He is relaxed. Personable. Cooperative; H e takes his medications as scheduled and he completes his daily assessment and on it he wrote he denied SI ad he rated his depression, hopelessness and anxiety " 0/0/2", respectively.   A He says hes feeling " better and better " every day he is here.    R Safety is in place.

## 2015-11-06 NOTE — Progress Notes (Signed)
Recreation Therapy Notes  Date: 01.20.2017 Time: 9:30am Location: [address] Group Room   Group Topic: Stress Management  Goal Area(s) Addresses:  Patient will actively participate in stress management techniques presented during session.   Behavioral Response: Appropriate, Engaged   Intervention: Stress management techniques  Activity :  Deep Breathing and Mindfulness. LRT provided instruction and demonstration on practice of Mindfulness. Technique was coupled with deep breathing.   Education:  Stress Management, Discharge Planning.   Education Outcome: Acknowledges education  Clinical Observations/Feedback: Patient actively engaged in technique introduced, expressed no concerns and demonstrated ability to practice independently post d/c.   Andera Cranmer L Amarachi Kotz, LRT/CTRS  Leler Brion L 11/06/2015 1:50 PM 

## 2015-11-06 NOTE — Progress Notes (Signed)
Pt attended evening AA group. 

## 2015-11-07 NOTE — Progress Notes (Deleted)
Tim [name] has been sad, depressed and uncomfortable today. He attended  His Life SKills group this morning and was quite engaged in the conversation, saying " how do I learn healthier coping skills?" afterwards! He completed his daily assessment and on it he wrote  He has experienced suicidal ideation today, but he contracts to stay safe with this nurse. He rates his depression, hopelessness and anxiety " 07/26/09". A He has received several prn's today, for c/o anxiety and agitation ( ativan and vistaril) and nursing continuing to work with pt to practice ehalthier behaviors. R Safety in place.  Addendum EKG performed and shown to NP ( TL ) due to abnormal result.

## 2015-11-07 NOTE — Progress Notes (Signed)
Patient did attend the evening speaker AA meeting.  

## 2015-11-07 NOTE — [facility] Group Notes (Signed)
[place] Group Notes:  (Nursing/MHT/Case Management/Adjunct)  Date:  11/07/2015  Time:  1000  Type of Therapy:  Nurse Education  /  Life SKills :  The group is focused on teaching the patients how to identify their needs and then identify healthy ways to get their needs met.  Participation Level:  Minimal  Participation Quality:  Appropriate  Affect:  Appropriate  Cognitive:  Alert  Insight:  Appropriate  Engagement in Group:  Engaged  Modes of Intervention:  Education  Summary of Progress/Problems:  Tim [name] 11/07/2015, 4:34 PM

## 2015-11-07 NOTE — Progress Notes (Signed)
[facility] MD Progress Note  11/07/2015 3:03 PM Tim [name]  MRN:  [id number] Subjective:  Patient reports " I am having a good day and I enjoy group sessions i am learning a lot"  Objective: Tim [name] is awake, alert and oriented X3 , found attending group session.  Denies suicidal or homicidal ideation. Denies auditory or visual hallucination and does not appear to be responding to internal stimuli. Patient reports interacting well with staff and others. Patient reports  he is medication compliant without mediation side effects. Report learning new coping skills. Stateshis depression 2/10. Patient states "I am having a good day, and is learning a lot from group sessions". Support, encouragement and reassurance was provided.   Principal Problem: Alcohol use disorder, severe, dependence ([facility]) Diagnosis:   Patient Active Problem List   Diagnosis Date Noted  . Alcohol use disorder, severe, dependence ([facility]) [F10.20] 11/05/2015  . 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 GAD (generalized anxiety disorder) [F41.1] 11/05/2015  . Alcohol dependence with uncomplicated withdrawal ([facility]) [F10.230] 07/15/2015  . Cellulitis of right lower extremity [L03.115] 03/29/2015  . Alcohol dependence ([facility]) [F10.20] 03/05/2012   Total Time spent with patient: 20 minutes  Past Psychiatric History: denies  Past Medical History:  Past Medical History  Diagnosis Date  . Mental health disorder   . Scoliosis   . Anxiety   . Depression   . Hep C w/o coma, chronic ([facility])   . COPD (chronic obstructive pulmonary disease) ([facility])   . Alcoholism ([facility])   . Thyroid disease     Past Surgical History  Procedure Laterality Date  . Thyroid surgery    . Skin cancer excision     Family History:  Family History  Problem Relation Age of Onset  . Dementia Mother    Family Psychiatric  History: see admission H and P Social History:  History  Alcohol Use  . Yes    Comment: 4 40 oz beers per  day     History  Drug Use  . Yes  . Special: Cocaine, Marijuana    Comment: also states will take pain pills every now and then, last use of cocaine and ETOH today at 1400    Social History   Social History  . Marital Status: Single    Spouse Name: N/A  . Number of Children: N/A  . Years of Education: N/A   Social History Main Topics  . Smoking status: Current Every Day Smoker -- 0.50 packs/day for 40 years    Types: Cigarettes  . Smokeless tobacco: Never Used  . Alcohol Use: Yes     Comment: 4 40 oz beers per day  . Drug Use: Yes    Special: Cocaine, Marijuana     Comment: also states will take pain pills every now and then, last use of cocaine and ETOH today at 1400  . Sexual Activity: No   Other Topics Concern  . None   Social History Narrative   ** Merged History Encounter **       Additional Social History:    History of alcohol / drug use?: Yes Longest period of sobriety (when/how long): one day recently Negative Consequences of Use: Financial Withdrawal Symptoms: DTs, Irritability, Tremors                    Sleep: Fair  Appetite:  Fair  Current Medications: Current Facility-Administered Medications  Medication Dose Route Frequency Provider Last Rate Last Dose  .  acetaminophen (TYLENOL) tablet 650 mg  650 mg Oral Q6H PRN Sanjuana Kava, NP      . alum & mag hydroxide-simeth (MAALOX/MYLANTA) 200-200-20 MG/5ML suspension 30 mL  30 mL Oral Q4H PRN Sanjuana Kava, NP      . chlordiazePOXIDE (LIBRIUM) capsule 25 mg  25 mg Oral Q6H PRN Sanjuana Kava, NP      . chlordiazePOXIDE (LIBRIUM) capsule 25 mg  25 mg Oral BH-qamhs Sanjuana Kava, NP   25 mg at 11/07/15 0840   Followed by  . [START ON 11/08/2015] chlordiazePOXIDE (LIBRIUM) capsule 25 mg  25 mg Oral Daily Sanjuana Kava, NP      . FLUoxetine (PROZAC) capsule 20 mg  20 mg Oral Daily Sanjuana Kava, NP   20 mg at 11/07/15 0840  . gabapentin (NEURONTIN) capsule 100 mg  100 mg Oral TID Sanjuana Kava, NP    100 mg at 11/07/15 1140  . hydrOXYzine (ATARAX/VISTARIL) tablet 25 mg  25 mg Oral Q6H PRN Sanjuana Kava, NP      . loperamide (IMODIUM) capsule 2-4 mg  2-4 mg Oral PRN Sanjuana Kava, NP      . magnesium hydroxide (MILK OF MAGNESIA) suspension 30 mL  30 mL Oral Daily PRN Sanjuana Kava, NP      . multivitamin with minerals tablet 1 tablet  1 tablet Oral Daily Sanjuana Kava, NP   1 tablet at 11/07/15 0840  . ondansetron (ZOFRAN-ODT) disintegrating tablet 4 mg  4 mg Oral Q6H PRN Sanjuana Kava, NP      . thiamine (VITAMIN B-1) tablet 100 mg  100 mg Oral Daily Sanjuana Kava, NP   100 mg at 11/07/15 0840  . traZODone (DESYREL) tablet 50 mg  50 mg Oral QHS Sanjuana Kava, NP   50 mg at 11/06/15 2202    Lab Results:  No results found for this or any previous visit (from the past 48 hour(s)).  Physical Findings: AIMS: Facial and Oral Movements Muscles of Facial Expression: None, normal Lips and Perioral Area: None, normal Jaw: None, normal Tongue: None, normal,Extremity Movements Upper (arms, wrists, hands, fingers): None, normal Lower (legs, knees, ankles, toes): None, normal, Trunk Movements Neck, shoulders, hips: None, normal, Overall Severity Severity of abnormal movements (highest score from questions above): None, normal Incapacitation due to abnormal movements: None, normal Patient's awareness of abnormal movements (rate only patient's report): No Awareness, Dental Status Current problems with teeth and/or dentures?: Yes (missing teeth; no c/o pain) Does patient usually wear dentures?: No  CIWA:  CIWA-Ar Total: 0 COWS:     Musculoskeletal: Strength & Muscle Tone: within normal limits Gait & Station: normal Patient leans: normal  Psychiatric Specialty Exam: Review of Systems  Constitutional: Negative.   HENT: Negative.   Eyes: Negative.   Respiratory: Negative.   Cardiovascular: Negative.   Gastrointestinal: Negative.   Genitourinary: Negative.   Musculoskeletal: Negative.    Skin: Negative.   Neurological: Negative.   Endo/Heme/Allergies: Negative.   Psychiatric/Behavioral: Positive for depression and substance abuse. The patient is nervous/anxious.     Blood pressure 101/66, pulse 66, temperature 98.1 F (36.7 C), temperature source Oral, resp. rate 16, height  (1.88 m), weight 82.101 kg (181 lb).Body mass index is 23.23 kg/(m^2).  General Appearance: Casual, Pleasant, clam and cooperative  Eye Contact::  Fair  Speech:  Clear and Coherent  Volume:  Normal  Mood:  Anxious and Depressed  Affect:  Appropriate and anxious  worried  Thought Process:  Coherent and Goal Directed  Orientation:  Full (Time, Place, and Person)  Thought Content:  symptoms events worries concerns  Suicidal Thoughts:  No  Homicidal Thoughts:  No  Memory:  Immediate;   Fair Recent;   Fair Remote;   Fair  Judgement:  Fair  Insight:  Present and Shallow  Psychomotor Activity:  Normal  Concentration:  Fair  Recall:  [employer] of Knowledge:Fair  Language: Fair  Akathisia:  No  Handed:  Right  AIMS (if indicated):     Assets:  Desire for Improvement Housing  ADL's:  Intact  Cognition: WNL  Sleep:  Number of Hours: 7.25      I agree with current treatment plan on 11/07/2015, Patient seen face-to-face for psychiatric evaluation follow-up, chart reviewed.  Reviewed the information documented and agree with the treatment plan.  Treatment Plan Summary:  Daily contact with patient to assess and evaluate symptoms and progress in treatment and Medication management Supportive approach/coping skills Alcohol dependence; Continue the Librium detox protocol/work a relapse prevention plan Continue Prozac 20 mg PO daily for depression Continue  Neurontin 100 mg optimize dose-response/Anxiety-agitation; Work with CBT/mindfulness  Oneta Rack FNP-BC 11/07/2015, 3:03 PM Agree with NP Progress Note, as above

## 2015-11-07 NOTE — [facility] Group Notes (Signed)
[facility] Group Notes:  (Clinical Social Work)   11/07/2015 1:15-2:15PM  Summary of Progress/Problems:   Today's process group involved patients discussing their feelings related to being hospitalized, as well as how they can use their present feelings to create a goal for the day.  A lengthy list of unhealthy coping techniques was discussed and group was able to state the connection between having a need and using a coping technique to feel that need, sometimes unhealthy and sometimes healthy.  Motivational interviewing was used to bring about a discussion about making choices about the unhealthy coping skills identified by each patient.   The patient expressed a primary feeling about being hospitalized of gratitude, saying he feels much better today.  He stated that after 3 days he will be "kicked out" and that makes him angry and sad because he really just wants to go to another institution for a long time.  He stated he was in prison for 12 years and has not been able to adjust to the outside world.  He said he lives with his mother and drinks, used to be able to do things for himself, but now is not able.  He stated he is supposed to be taking care of his mother, but she is actually taking care of him.  He reported feeling like he is emotionally like a 60yo, even though he was not drinking at that age.   The patient expressed that the unhealthy coping he often uses is alcohol.  He was told during group about a local supported employment program (at [place] where he already gets his medication management).  He stated he may be interested in rehab for 30, 60, or 90 days.  Type of Therapy:  Group Therapy - Process   Participation Level:  Active  Participation Quality:  Attentive and Sharing  Affect:  Blunted and Depressed  Cognitive:  Appropriate and Oriented  Insight:  Developing/Improving  Engagement in Therapy:  Engaged  Modes of Intervention:  Discussion, Motivational Interviewing  Ambrose Mantle, LCSW 11/07/2015, 5:18 PM

## 2015-11-07 NOTE — Progress Notes (Signed)
Pt has been observed in the dayroom and reports that he has had a fairly good day.  He reports that his withdrawal symptoms are minimal.  He feels his medications are working and helping him.  He denies SI/HI/AVH.  He plans to go stay with his mother at discharge, because he does not feel he needs to go for rehab.  "I just need to stay away from the wrong people."  Pt is pleasant and cooperative.  He makes his needs known to staff and is med compliant.  Discharge plans are in process.  Support and encouragement offered.  Safety maintained with q15 minute checks.

## 2015-11-07 NOTE — Progress Notes (Signed)
Tim [name] cont to get stronger and  Stronger each day. HE takes his meds as scheduled. HE completes his daily assessment  On  it he wrote  He denied SI and he rated his depression, hopelessness and anxiety " 2/2/1", respectively. A HE does attend his groups and he is engaged in getting healtheir as evidenced by his sincerity during group today.  R Safety is in place.

## 2015-11-08 NOTE — Progress Notes (Signed)
[facility] MD Progress Note  11/08/2015 5:09 PM Tim [name]  MRN:  [id number] Subjective:  Patient reports " I am okay today, I know I have to get a handle on this drinking."  Objective: Tim [name] is awake, alert and oriented X3. Patient is pleasant, clam and cooperative.  patient found interacting with peers.  Denies suicidal or homicidal ideation. Denies auditory or visual hallucination and does not appear to be responding to internal stimuli. Patient reports interacting well with staff and others. Patient reports  he is medication compliant without medication side effects. State his depression 2/10. Patient states "I am okay today,states Iam truly blessed that I have family support." I just have a drinking problem." Patient states he is eager to finish treatment and find work. Support, encouragement and reassurance was provided.   Principal Problem: Alcohol use disorder, severe, dependence (HCC) Diagnosis:   Patient Active Problem List   Diagnosis Date Noted  . Alcohol use disorder, severe, dependence (HCC) [F10.20] 11/05/2015  . 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 GAD (generalized anxiety disorder) [F41.1] 11/05/2015  . Alcohol dependence with uncomplicated withdrawal (HCC) [F10.230] 07/15/2015  . Cellulitis of right lower extremity [L03.115] 03/29/2015  . Alcohol dependence (HCC) [F10.20] 03/05/2012   Total Time spent with patient: 20 minutes  Past Psychiatric History: denies  Past Medical History:  Past Medical History  Diagnosis Date  . Mental health disorder   . Scoliosis   . Anxiety   . Depression   . Hep C w/o coma, chronic (HCC)   . COPD (chronic obstructive pulmonary disease) (HCC)   . Alcoholism (HCC)   . Thyroid disease     Past Surgical History  Procedure Laterality Date  . Thyroid surgery    . Skin cancer excision     Family History:  Family History  Problem Relation Age of Onset  . Dementia Mother    Family Psychiatric   History: see admission H and P Social History:  History  Alcohol Use  . Yes    Comment: 4 40 oz beers per day     History  Drug Use  . Yes  . Special: Cocaine, Marijuana    Comment: also states will take pain pills every now and then, last use of cocaine and ETOH today at 1400    Social History   Social History  . Marital Status: Single    Spouse Name: N/A  . Number of Children: N/A  . Years of Education: N/A   Social History Main Topics  . Smoking status: Current Every Day Smoker -- 0.50 packs/day for 40 years    Types: Cigarettes  . Smokeless tobacco: Never Used  . Alcohol Use: Yes     Comment: 4 40 oz beers per day  . Drug Use: Yes    Special: Cocaine, Marijuana     Comment: also states will take pain pills every now and then, last use of cocaine and ETOH today at 1400  . Sexual Activity: No   Other Topics Concern  . None   Social History Narrative   ** Merged History Encounter **       Additional Social History:    History of alcohol / drug use?: Yes Longest period of sobriety (when/how long): one day recently Negative Consequences of Use: Financial Withdrawal Symptoms: DTs, Irritability, Tremors                    Sleep: Fair  Appetite:  Fair  Current Medications: Current Facility-Administered Medications  Medication Dose Route Frequency Provider Last Rate Last Dose  . acetaminophen (TYLENOL) tablet 650 mg  650 mg Oral Q6H PRN Sanjuana Kava, NP   650 mg at 11/07/15 2114  . alum & mag hydroxide-simeth (MAALOX/MYLANTA) 200-200-20 MG/5ML suspension 30 mL  30 mL Oral Q4H PRN Sanjuana Kava, NP      . FLUoxetine (PROZAC) capsule 20 mg  20 mg Oral Daily Sanjuana Kava, NP   20 mg at 11/08/15 0827  . gabapentin (NEURONTIN) capsule 100 mg  100 mg Oral TID Sanjuana Kava, NP   100 mg at 11/08/15 1200  . magnesium hydroxide (MILK OF MAGNESIA) suspension 30 mL  30 mL Oral Daily PRN Sanjuana Kava, NP      . multivitamin with minerals tablet 1 tablet  1 tablet  Oral Daily Sanjuana Kava, NP   1 tablet at 11/08/15 0827  . thiamine (VITAMIN B-1) tablet 100 mg  100 mg Oral Daily Sanjuana Kava, NP   100 mg at 11/08/15 0827  . traZODone (DESYREL) tablet 50 mg  50 mg Oral QHS Sanjuana Kava, NP   50 mg at 11/07/15 2115    Lab Results:  No results found for this or any previous visit (from the past 48 hour(s)).  Physical Findings: AIMS: Facial and Oral Movements Muscles of Facial Expression: None, normal Lips and Perioral Area: None, normal Jaw: None, normal Tongue: None, normal,Extremity Movements Upper (arms, wrists, hands, fingers): None, normal Lower (legs, knees, ankles, toes): None, normal, Trunk Movements Neck, shoulders, hips: None, normal, Overall Severity Severity of abnormal movements (highest score from questions above): None, normal Incapacitation due to abnormal movements: None, normal Patient's awareness of abnormal movements (rate only patient's report): No Awareness, Dental Status Current problems with teeth and/or dentures?: Yes (missing teeth; no c/o pain) Does patient usually wear dentures?: No  CIWA:  CIWA-Ar Total: 1 COWS:     Musculoskeletal: Strength & Muscle Tone: within normal limits Gait & Station: normal Patient leans: normal  Psychiatric Specialty Exam: Review of Systems  Constitutional: Negative.   HENT: Negative.   Eyes: Negative.   Respiratory: Negative.   Cardiovascular: Negative.   Gastrointestinal: Negative.   Genitourinary: Negative.   Musculoskeletal: Negative.   Skin: Negative.   Neurological: Negative.   Endo/Heme/Allergies: Negative.   Psychiatric/Behavioral: Positive for depression and substance abuse. Negative for suicidal ideas and hallucinations. The patient is nervous/anxious. Insomnia: improving.     Blood pressure 112/73, pulse 70, temperature 97.7 F (36.5 C), temperature source Oral, resp. rate 20, height  (1.88 m), weight 82.101 kg (181 lb).Body mass index is 23.23 kg/(m^2).   General Appearance: Neat,   Eye Contact::  Fair  Speech:  Clear and Coherent  Volume:  Normal  Mood:  Anxious and Depressed  Affect:  Appropriate and anxious worried  Thought Process:  Coherent and Goal Directed  Orientation:  Full (Time, Place, and Person)  Thought Content:  symptoms of worries   Suicidal Thoughts:  No  Homicidal Thoughts:  No  Memory:  Immediate;   Fair Recent;   Fair Remote;   Fair  Judgement:  Fair  Insight:  Present and Shallow  Psychomotor Activity:  Normal  Concentration:  Fair  Recall:  [employer] of Knowledge:Fair  Language: Fair  Akathisia:  No  Handed:  Right  AIMS (if indicated):     Assets:  Desire for Improvement Housing  ADL's:  Intact  Cognition: WNL  Sleep:  Number of Hours: 6.5     I agree with current treatment plan on 11/08/2015, Patient seen face-to-face for psychiatric evaluation follow-up, chart reviewed.  Reviewed the information documented and agree with the treatment plan.  Treatment Plan Summary:  Daily contact with patient to assess and evaluate symptoms and progress in treatment and Medication management Supportive approach/coping skills Alcohol dependence; Continue the Librium detox protocol/work a relapse prevention plan Continue Prozac 20 mg PO daily for depression Continue  Neurontin 100 mg optimize dose-response/Anxiety-agitation; Work with CBT/mindfulness   Oneta Rack FNP-BC 11/08/2015, 5:09 PM Agree with NP Progress Note, as above

## 2015-11-08 NOTE — Progress Notes (Signed)
D: Pt has appropriate affect and pleasant mood.  When asked about his day, he reports it was "pretty mellow, groups were good."  He reports his goal was to "be mellow, I've been watching the game."  Pt denies SI/HI, denies hallucinations, reports bilateral knee pain of 4/10.  Pt has been visible in milieu interacting with peers and staff appropriately.  Pt attended evening group.   A: Met with pt 1:1 and offered support and encouragement.  Actively listened to pt.  Medications administered per order.  PRN medication administered for pain. R: Pt is compliant with medications.  Pt verbally contracts for safety.  Will continue to monitor and assess.

## 2015-11-08 NOTE — Progress Notes (Signed)
Tim [name] is doing a little better today. Tim [name] continues to remain positive, Tim [name] attends his groups, Tim [name] participates, Tim [name] takes his medications as scheduled and Tim [name]  completes his daily assessment .On it Tim [name] wrote Tim [name] denied SI today and Tim [name] rated his depression, hopelessness and anxiety " 3/1/1/", respectively.   A Tim [name] says " I know I can do it ".    R Safety is in place and poc cont.

## 2015-11-08 NOTE — [facility] Group Notes (Signed)
[facility] Group Notes:  (Nursing/MHT/Case Management/Adjunct)  Date:  11/08/2015  Time:  1000 Type of Therapy:  Nurse Education  /  Life Skills : The group is focused on teaching patients the importance of establishing healthy support systems.  Participation Level:  Active  Participation Quality:  Appropriate  Affect:  Appropriate  Cognitive:  Appropriate  Insight:  Appropriate  Engagement in Group:  Engaged  Modes of Intervention:  Education  Summary of Progress/Problems:  Tim [name] 11/08/2015, 12:12 PM

## 2015-11-08 NOTE — [facility] Group Notes (Signed)
[facility] Group Notes:  (Clinical Social Work)  11/08/2015  1:15-2:30PM  Summary of Progress/Problems:   The main focus of today's process group was to   1)  discuss the importance of adding supports  2)  define health supports versus unhealthy supports  3)  identify the patient's current unhealthy supports and plan how to handle them  4)  Identify the patient's current healthy supports and plan what to add.  An emphasis was placed on using counselor, doctor, therapy groups, 12-step groups, and problem-specific support groups to expand supports.    The patient expressed full comprehension of the concepts presented, and agreed that there is a need to add more supports.  The patient stated that he needs some transportation supports to be put in place in order to help him access his health supports and professional supports.  He offered to be a friend on an ongoing basis to other group members and was able to participate helpfully in a discussion about the important of seeking out more support.  Type of Therapy:  Process Group with Motivational Interviewing  Participation Level:  Active  Participation Quality:  Attentive and Sharing  Affect:  Anxious and Blunted  Cognitive:  Appropriate and Oriented  Insight:  Developing/Improving  Engagement in Therapy:  Engaged  Modes of Intervention:   Education, Support and Processing, Activity  Ambrose Mantle, LCSW 11/08/2015

## 2015-11-08 NOTE — Progress Notes (Signed)
D: Pt has appropriate affect and pleasant mood.  He reports his goal today was to "be more sociable and talk in class, tell my feelings; it's been a pretty good day."  Pt denies SI/HI, denies hallucinations, reports right groin pain of 6/10.  Pt has been visible in milieu interacting with peers and staff appropriately.  Pt attended evening group.   A: Introduced self to pt.  Met with pt 1:1 and provided support and encouragement.  Actively listened to pt.  Medications administered per order.  PRN medication administered for pain and anxiety. R: Pt is compliant with medications.  Pt verbally contracts for safety.  Will continue to monitor and assess.

## 2015-11-08 NOTE — Plan of Care (Signed)
Problem: Alteration in mood Goal: LTG-Patient reports reduction in suicidal thoughts (Patient reports reduction in suicidal thoughts and is able to verbalize a safety plan for whenever patient is feeling suicidal)  Outcome: Progressing Pt denies SI and verbally contracts for safety.       

## 2015-11-08 NOTE — Progress Notes (Signed)
Patient did attend the evening speaker AA meeting.  

## 2015-11-09 MED ORDER — ACAMPROSATE CALCIUM 333 MG PO TBEC
666.0000 mg | DELAYED_RELEASE_TABLET | Freq: Three times a day (TID) | ORAL | Status: DC
  Administered 2015-11-10 – 2015-11-11 (×5): 666 mg via ORAL
  Filled 2015-11-09 (×2): qty 42
  Filled 2015-11-09: qty 2
  Filled 2015-11-09 (×2): qty 42
  Filled 2015-11-09 (×5): qty 2
  Filled 2015-11-09: qty 42
  Filled 2015-11-09: qty 2
  Filled 2015-11-09: qty 42
  Filled 2015-11-09: qty 2

## 2015-11-09 MED ORDER — HYDROXYZINE HCL 25 MG PO TABS
25.0000 mg | ORAL_TABLET | Freq: Four times a day (QID) | ORAL | Status: DC | PRN
  Administered 2015-11-09: 25 mg via ORAL
  Filled 2015-11-09: qty 1
  Filled 2015-11-09: qty 10

## 2015-11-09 MED ORDER — GABAPENTIN 100 MG PO CAPS
200.0000 mg | ORAL_CAPSULE | Freq: Three times a day (TID) | ORAL | Status: DC
  Administered 2015-11-10 – 2015-11-11 (×5): 200 mg via ORAL
  Filled 2015-11-09: qty 2
  Filled 2015-11-09: qty 42
  Filled 2015-11-09 (×2): qty 2
  Filled 2015-11-09 (×2): qty 42
  Filled 2015-11-09 (×2): qty 2
  Filled 2015-11-09 (×2): qty 42
  Filled 2015-11-09 (×3): qty 2
  Filled 2015-11-09: qty 42

## 2015-11-09 NOTE — [facility] Group Notes (Signed)
[facility] LCSW Aftercare Discharge Planning Group Note   11/09/2015 9:39 AM  Participation Quality:  Appropriate   Mood/Affect:  Appropriate  Depression Rating:  7  Anxiety Rating:  2  Thoughts of Suicide:  No Will you contract for safety?   NA  Current AVH:  No  Plan for Discharge/Comments:  Pt reports "my cravings are still there and I still have shakes and high anxiety." Pt reports that his mom is supportive and plans to return home to her. PT is hoping to d/c by Wed but does not feel ready to d/c today.   Transportation Means: mother?  Supports: mother  Smart, Herbert Seta LCSW

## 2015-11-09 NOTE — Progress Notes (Signed)
D: Pt has appropriate affect and anxious mood.  When asked about his day, pt reports it was "pretty good, this is the first day I've stayed up, been conversing with my peers; my anxiety's a little rough."  Pt denies SI/HI, denies hallucinations, denies pain.  Pt has been visible in milieu interacting with peers and staff appropriately.  Pt attended evening group.   A: Met with pt 1:1 and provided support and encouragement.  Actively listened to pt.  Medications administered per order.  On-site provider notified of pt reporting increased anxiety and Vistaril 25 mg PO PRN was ordered and administered.   R: Pt is compliant with medications.  Pt verbally contracts for safety.  Will continue to monitor and assess.

## 2015-11-09 NOTE — Progress Notes (Signed)
[facility] MD Progress Note  11/09/2015 7:02 PM Tim [name]  MRN:  [id number] Subjective:  Tim [name] states he is having a hard time. He feels anxious "shaky" he is worried he would relapse. He is having a lot of cravings Principal Problem: Alcohol use disorder, severe, dependence (HCC) Diagnosis:   Patient Active Problem List   Diagnosis Date Noted  . Alcohol use disorder, severe, dependence (HCC) [F10.20] 11/05/2015  . 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 GAD (generalized anxiety disorder) [F41.1] 11/05/2015  . Alcohol dependence with uncomplicated withdrawal (HCC) [F10.230] 07/15/2015  . Cellulitis of right lower extremity [L03.115] 03/29/2015  . Alcohol dependence (HCC) [F10.20] 03/05/2012   Total Time spent with patient: 15 minutes  Past Psychiatric History: see admission H and P  Past Medical History:  Past Medical History  Diagnosis Date  . Mental health disorder   . Scoliosis   . Anxiety   . Depression   . Hep C w/o coma, chronic (HCC)   . COPD (chronic obstructive pulmonary disease) (HCC)   . Alcoholism (HCC)   . Thyroid disease     Past Surgical History  Procedure Laterality Date  . Thyroid surgery    . Skin cancer excision     Family History:  Family History  Problem Relation Age of Onset  . Dementia Mother    Family Psychiatric  History: see admission H and P Social History:  History  Alcohol Use  . Yes    Comment: 4 40 oz beers per day     History  Drug Use  . Yes  . Special: Cocaine, Marijuana    Comment: also states will take pain pills every now and then, last use of cocaine and ETOH today at 1400    Social History   Social History  . Marital Status: Single    Spouse Name: N/A  . Number of Children: N/A  . Years of Education: N/A   Social History Main Topics  . Smoking status: Current Every Day Smoker -- 0.50 packs/day for 40 years    Types: Cigarettes  . Smokeless tobacco: Never Used  . Alcohol Use: Yes   Comment: 4 40 oz beers per day  . Drug Use: Yes    Special: Cocaine, Marijuana     Comment: also states will take pain pills every now and then, last use of cocaine and ETOH today at 1400  . Sexual Activity: No   Other Topics Concern  . None   Social History Narrative   ** Merged History Encounter **       Additional Social History:    History of alcohol / drug use?: Yes Longest period of sobriety (when/how long): one day recently Negative Consequences of Use: Financial Withdrawal Symptoms: DTs, Irritability, Tremors                    Sleep: Fair  Appetite:  Fair  Current Medications: Current Facility-Administered Medications  Medication Dose Route Frequency Provider Last Rate Last Dose  . acetaminophen (TYLENOL) tablet 650 mg  650 mg Oral Q6H PRN Sanjuana Kava, NP   650 mg at 11/08/15 2122  . alum & mag hydroxide-simeth (MAALOX/MYLANTA) 200-200-20 MG/5ML suspension 30 mL  30 mL Oral Q4H PRN Sanjuana Kava, NP      . FLUoxetine (PROZAC) capsule 20 mg  20 mg Oral Daily Sanjuana Kava, NP   20 mg at 11/09/15 0818  . gabapentin (NEURONTIN) capsule 100 mg  100 mg  Oral TID Sanjuana Kava, NP   100 mg at 11/09/15 1654  . magnesium hydroxide (MILK OF MAGNESIA) suspension 30 mL  30 mL Oral Daily PRN Sanjuana Kava, NP      . multivitamin with minerals tablet 1 tablet  1 tablet Oral Daily Sanjuana Kava, NP   1 tablet at 11/09/15 0819  . thiamine (VITAMIN B-1) tablet 100 mg  100 mg Oral Daily Sanjuana Kava, NP   100 mg at 11/09/15 0818  . traZODone (DESYREL) tablet 50 mg  50 mg Oral QHS Sanjuana Kava, NP   50 mg at 11/08/15 2122    Lab Results: No results found for this or any previous visit (from the past 48 hour(s)).  Physical Findings: AIMS: Facial and Oral Movements Muscles of Facial Expression: None, normal Lips and Perioral Area: None, normal Jaw: None, normal Tongue: None, normal,Extremity Movements Upper (arms, wrists, hands, fingers): None, normal Lower (legs,  knees, ankles, toes): None, normal, Trunk Movements Neck, shoulders, hips: None, normal, Overall Severity Severity of abnormal movements (highest score from questions above): None, normal Incapacitation due to abnormal movements: None, normal Patient's awareness of abnormal movements (rate only patient's report): No Awareness, Dental Status Current problems with teeth and/or dentures?: Yes (missing teeth; no c/o pain) Does patient usually wear dentures?: No  CIWA:  CIWA-Ar Total: 1 COWS:     Musculoskeletal: Strength & Muscle Tone: within normal limits Gait & Station: normal Patient leans: normal  Psychiatric Specialty Exam: Review of Systems  Constitutional: Positive for malaise/fatigue.  Eyes: Negative.   Respiratory: Negative.   Cardiovascular: Negative.   Gastrointestinal: Negative.   Genitourinary: Negative.   Musculoskeletal: Positive for myalgias.  Skin: Negative.   Neurological: Positive for tremors, weakness and headaches.  Endo/Heme/Allergies: Negative.   Psychiatric/Behavioral: Positive for depression and substance abuse. The patient is nervous/anxious.     Blood pressure 104/66, pulse 74, temperature 97 F (36.1 C), temperature source Oral, resp. rate 20, height  (1.88 m), weight 82.101 kg (181 lb).Body mass index is 23.23 kg/(m^2).  General Appearance: Fairly Groomed  Patent attorney::  Fair  Speech:  Clear and Coherent  Volume:  Decreased  Mood:  Anxious and Depressed  Affect:  anxious worried  Thought Process:  Coherent and Goal Directed  Orientation:  Full (Time, Place, and Person)  Thought Content:  symptoms events worries concerns  Suicidal Thoughts:  No  Homicidal Thoughts:  No  Memory:  Immediate;   Fair Recent;   Fair Remote;   Fair  Judgement:  Fair  Insight:  Present and Shallow  Psychomotor Activity:  Restlessness  Concentration:  Fair  Recall:  [employer] of Knowledge:Fair  Language: Fair  Akathisia:  No  Handed:  Right  AIMS (if  indicated):     Assets:  Desire for Improvement Housing  ADL's:  Intact  Cognition: WNL  Sleep:  Number of Hours: 6.25   Treatment Plan Summary: Daily contact with patient to assess and evaluate symptoms and progress in treatment and Medication management  Supportive approach/coping skills Alcohol dependence; continue to work a relapse prevention plan Alcohol cravings; will start a trial with Campral 333 mg two TID Depression continue the Prozac 20 mg daily Anxiety-agitation; increase the Neurontin to 200 mg TID Work with CBT/mindfulness  Jayziah Bankhead A 11/09/2015, 7:02 PM

## 2015-11-09 NOTE — Progress Notes (Signed)
D: Pt presents anxious on approach. Pt rates depression 3/10. Anxiety 6/10. Hopeless 2/10. Pt denies suicidal thoughts. Pt reports withdrawal symptoms of irritability and cravings. Pt verbalized to writer this morning that he doesn't like to attend AA meeting and listen to people war stories. Pt compliant with taking meds, no adverse reactions to meds verbalized by pt. A: Medications reviewed with pt. Medications administered as ordered per MD. Verbal support given. Pt encouraged to attend groups. 15 minute checks performed for safety. R: Pt stated goal "be a better person". Pt verbalizes understanding of med regimen. Pt receptive to tx.

## 2015-11-09 NOTE — Plan of Care (Signed)
Problem: Alteration in mood & ability to function due to Goal: STG-Patient will comply with prescribed medication regimen (Patient will comply with prescribed medication regimen)  Outcome: Progressing Pt has been compliant with scheduled medication this shift.

## 2015-11-09 NOTE — Progress Notes (Addendum)
Pt attended spiritual care group on grief and loss facilitated by chaplain Burnis Kingfisher  Group opened with brief discussion and psycho-social ed around grief and loss in relationships and in relation to self - identifying life patterns, circumstances, changes that cause losses. Established group norm of speaking from own life experience. Group goal of establishing open and affirming space for members to share loss and experience with grief, normalize grief experience and provide psycho social education and grief support.  Tim [name] was present throughout group.  He was attentive, but did not engage verbally.  He left group to speak with provider, and then returned.    Belva Crome MDiv

## 2015-11-09 NOTE — Plan of Care (Signed)
Problem: Alteration in mood & ability to function due to Goal: LTG-Pt reports reduction in suicidal thoughts (Patient reports reduction in suicidal thoughts and is able to verbalize a safety plan for whenever patient is feeling suicidal)  Outcome: Progressing Pt denies suicidal thoughts. Pt verbally contracts for safety Goal: LTG-Pt verbalizes understanding of importance of med regimen (Patient verbalizes understanding of importance of medication regimen and need to continue outpatient care and support groups)  Outcome: Progressing Pt verbalizes understanding of med regimen. Pt compliant with taking meds. Goal: STG-Patient will report withdrawal symptoms Outcome: Progressing Pt reports decreased alcohol withdrawal symptoms. Pt reports having cravings and mild tremors.

## 2015-11-09 NOTE — Progress Notes (Deleted)
Pt attended spiritual care group on grief and loss facilitated by chaplain Betsie Peckman   Group opened with brief discussion and psycho-social ed around grief and loss in relationships and in relation to self - identifying life patterns, circumstances, changes that cause losses. Established group norm of speaking from own life experience. Group goal of establishing open and affirming space for members to share loss and experience with grief, normalize grief experience and provide psycho social education and grief support.     

## 2015-11-09 NOTE — [facility] Group Notes (Signed)
[facility] LCSW Group Therapy  11/09/2015 11:53 AM  Type of Therapy:  Group Therapy  Participation Level:  Active  Participation Quality:  Attentive  Affect:  Appropriate  Cognitive:  Alert and Oriented  Insight:  Engaged  Engagement in Therapy:  Improving  Modes of Intervention:  Confrontation, Discussion, Education, Exploration, Problem-solving, Rapport Building, Socialization and Support  Summary of Progress/Problems: Today's Topic: Overcoming Obstacles. Patients identified one short term goal and potential obstacles in reaching this goal. Patients processed barriers involved in overcoming these obstacles. Patients identified steps necessary for overcoming these obstacles and explored motivation (internal and external) for facing these difficulties head on. Tim [name] was attentive and engaged during today's processing group. He shared that his [place] obstacle [place] Tim [name] stated that he is working on fixing his scooter and is hoping that his mother will help him finance this.   Smart, Tim Loeza LCSW 11/09/2015, 11:53 AM

## 2015-11-09 NOTE — Plan of Care (Signed)
Problem: Diagnosis: Increased Risk For Suicide Attempt Goal: STG-Patient Will Attend All Groups On The Unit Outcome: Progressing Pt attended evening group on 11/09/15 Goal: STG-Patient Will Comply With Medication Regime Outcome: Progressing Pt has been compliant with medications tonight.

## 2015-11-09 NOTE — Progress Notes (Signed)
Recreation Therapy Notes  Date: 01.23.2017 Time: 9:30am Location: [address] Group Room   Group Topic: Stress Management  Goal Area(s) Addresses:  Patient will actively participate in stress management techniques presented during session.   Behavioral Response: Did not attend.   Emojean Gertz L Julitza Rickles, LRT/CTRS        Aynslee Mulhall L 11/09/2015 3:38 PM 

## 2015-11-10 NOTE — Progress Notes (Signed)
D: Pt reports that he's making good progress. Pt rates depression 3/10. Anxiety 6/10. Hopeless 6/10. Pt denies suicidal thoughts. Pt verbally contracts for safety. Pt reports good sleep last night. Pt reports decreased withdrawal symptoms, pt c/o tremors and cravings. Pt stated that after d/c he will f/u with [facility] and work on his bike. Pt compliant with taking meds and attending groups. No adverse reaction to meds verbalized by pt. A: Medications reviewed with pt. Medications administered as ordered per MD. Verbal support provided. Pt encouraged to attend groups.  R: Pt stated goal "to be productive as I can". Pt verbalizes understanding of med regimen. Pt receptive to tx.

## 2015-11-10 NOTE — [facility] Group Notes (Signed)
Adult Psychoeducational Group Note  Date:  11/10/2015 Time:  9:56 PM  Group Topic/Focus:  AA Group   Participation Level:  Active  Participation Quality:  Appropriate  Affect:  Flat  Cognitive:  Appropriate  Insight: Good  Engagement in Group:  Engaged  Modes of Intervention:  Discussion and Education  Additional Comments:  Pt attended AA group.  Caroll Rancher A 11/10/2015, 9:56 PM

## 2015-11-10 NOTE — Progress Notes (Signed)
[facility] MD Progress Note  11/10/2015 6:13 PM Tim [name]  MRN:  [id number] Subjective:  Tim [name] states that he is still feeling shaky, feels he is still detoxing afraid of being home right now. States he is really afraid of relapsing Principal Problem: Alcohol use disorder, severe, dependence (HCC) Diagnosis:   Patient Active Problem List   Diagnosis Date Noted  . Alcohol use disorder, severe, dependence (HCC) [F10.20] 11/05/2015  . 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 GAD (generalized anxiety disorder) [F41.1] 11/05/2015  . Alcohol dependence with uncomplicated withdrawal (HCC) [F10.230] 07/15/2015  . Cellulitis of right lower extremity [L03.115] 03/29/2015  . Alcohol dependence (HCC) [F10.20] 03/05/2012   Total Time spent with patient: 20 minutes  Past Psychiatric History: see admission H and P  Past Medical History:  Past Medical History  Diagnosis Date  . Mental health disorder   . Scoliosis   . Anxiety   . Depression   . Hep C w/o coma, chronic (HCC)   . COPD (chronic obstructive pulmonary disease) (HCC)   . Alcoholism (HCC)   . Thyroid disease     Past Surgical History  Procedure Laterality Date  . Thyroid surgery    . Skin cancer excision     Family History:  Family History  Problem Relation Age of Onset  . Dementia Mother    Family Psychiatric  History: see admission H and P Social History:  History  Alcohol Use  . Yes    Comment: 4 40 oz beers per day     History  Drug Use  . Yes  . Special: Cocaine, Marijuana    Comment: also states will take pain pills every now and then, last use of cocaine and ETOH today at 1400    Social History   Social History  . Marital Status: Single    Spouse Name: N/A  . Number of Children: N/A  . Years of Education: N/A   Social History Main Topics  . Smoking status: Current Every Day Smoker -- 0.50 packs/day for 40 years    Types: Cigarettes  . Smokeless tobacco: Never Used  .  Alcohol Use: Yes     Comment: 4 40 oz beers per day  . Drug Use: Yes    Special: Cocaine, Marijuana     Comment: also states will take pain pills every now and then, last use of cocaine and ETOH today at 1400  . Sexual Activity: No   Other Topics Concern  . None   Social History Narrative   ** Merged History Encounter **       Additional Social History:    History of alcohol / drug use?: Yes Longest period of sobriety (when/how long): one day recently Negative Consequences of Use: Financial Withdrawal Symptoms: DTs, Irritability, Tremors                    Sleep: Fair  Appetite:  Fair  Current Medications: Current Facility-Administered Medications  Medication Dose Route Frequency Provider Last Rate Last Dose  . acamprosate (CAMPRAL) tablet 666 mg  666 mg Oral TID WC Rachael Fee, MD   666 mg at 11/10/15 1639  . acetaminophen (TYLENOL) tablet 650 mg  650 mg Oral Q6H PRN Sanjuana Kava, NP   650 mg at 11/10/15 1548  . alum & mag hydroxide-simeth (MAALOX/MYLANTA) 200-200-20 MG/5ML suspension 30 mL  30 mL Oral Q4H PRN Sanjuana Kava, NP      . FLUoxetine (PROZAC) capsule  20 mg  20 mg Oral Daily Sanjuana Kava, NP   20 mg at 11/10/15 0741  . gabapentin (NEURONTIN) capsule 200 mg  200 mg Oral TID Rachael Fee, MD   200 mg at 11/10/15 1639  . hydrOXYzine (ATARAX/VISTARIL) tablet 25 mg  25 mg Oral Q6H PRN Kerry Hough, PA-C   25 mg at 11/09/15 2104  . magnesium hydroxide (MILK OF MAGNESIA) suspension 30 mL  30 mL Oral Daily PRN Sanjuana Kava, NP      . multivitamin with minerals tablet 1 tablet  1 tablet Oral Daily Sanjuana Kava, NP   1 tablet at 11/10/15 0741  . thiamine (VITAMIN B-1) tablet 100 mg  100 mg Oral Daily Sanjuana Kava, NP   100 mg at 11/10/15 0742  . traZODone (DESYREL) tablet 50 mg  50 mg Oral QHS Sanjuana Kava, NP   50 mg at 11/09/15 2104    Lab Results: No results found for this or any previous visit (from the past 48 hour(s)).  Physical Findings: AIMS:  Facial and Oral Movements Muscles of Facial Expression: None, normal Lips and Perioral Area: None, normal Jaw: None, normal Tongue: None, normal,Extremity Movements Upper (arms, wrists, hands, fingers): None, normal Lower (legs, knees, ankles, toes): None, normal, Trunk Movements Neck, shoulders, hips: None, normal, Overall Severity Severity of abnormal movements (highest score from questions above): None, normal Incapacitation due to abnormal movements: None, normal Patient's awareness of abnormal movements (rate only patient's report): No Awareness, Dental Status Current problems with teeth and/or dentures?: Yes (missing teeth; no c/o pain) Does patient usually wear dentures?: No  CIWA:  CIWA-Ar Total: 1 COWS:     Musculoskeletal: Strength & Muscle Tone: within normal limits Gait & Station: normal Patient leans: normal  Psychiatric Specialty Exam: Review of Systems  Constitutional: Positive for malaise/fatigue.  HENT: Negative.   Eyes: Negative.   Respiratory: Negative.   Cardiovascular: Negative.   Gastrointestinal: Negative.   Genitourinary: Negative.   Musculoskeletal: Negative.   Skin: Negative.   Neurological: Positive for weakness.  Endo/Heme/Allergies: Negative.   Psychiatric/Behavioral: Positive for depression and substance abuse. The patient is nervous/anxious.     Blood pressure 102/63, pulse 63, temperature 97.5 F (36.4 C), temperature source Oral, resp. rate 20, height  (1.88 m), weight 82.101 kg (181 lb).Body mass index is 23.23 kg/(m^2).  General Appearance: Fairly Groomed  Patent attorney::  Fair  Speech:  Clear and Coherent  Volume:  Decreased  Mood:  Anxious, Depressed and worried  Affect:  anxious worried  Thought Process:  Coherent and Goal Directed  Orientation:  Full (Time, Place, and Person)  Thought Content:  symptoms events worries concerns  Suicidal Thoughts:  No  Homicidal Thoughts:  No  Memory:  Immediate;   Fair Recent;   Fair Remote;    Fair  Judgement:  Fair  Insight:  Present and Shallow  Psychomotor Activity:  Restlessness  Concentration:  Fair  Recall:  [employer] of Knowledge:Fair  Language: Fair  Akathisia:  No  Handed:  Right  AIMS (if indicated):     Assets:  Desire for Improvement Housing Social Support  ADL's:  Intact  Cognition: WNL  Sleep:  Number of Hours: 6.5   Treatment Plan Summary: Daily contact with patient to assess and evaluate symptoms and progress in treatment and Medication management  Supportive approach/coping skills Alcohol dependence: continue to work a relapse prevention plan Alcohol cravings; pursue the Campral two TID Anxiety-agitation; continue the  Neurontin 200 mg TID and reassess Insomnia; will continue the Trazodone 50 mg HS PRN sleep Depression; continue Prozac 20 mg daily Use CBT/mindfulness Carmine Youngberg A 11/10/2015, 6:13 PM

## 2015-11-10 NOTE — [facility] Group Notes (Signed)
[facility] LCSW Group Therapy  11/10/2015 1:20 PM  Type of Therapy:  Group Therapy  Participation Level:  Active  Participation Quality:  Attentive  Affect:  Appropriate  Cognitive:  Alert  Insight:  Improving  Engagement in Therapy:  Improving  Modes of Intervention:  Discussion, Education, Exploration, Problem-solving, Rapport Building, Socialization and Support  Summary of Progress/Problems: MHA Speaker came to talk about his personal journey with substance abuse and addiction. The pt processed ways by which to relate to the speaker. MHA speaker provided handouts and educational information pertaining to groups and services offered by the [facility].   Smart, Chalice Philbert LCSW 11/10/2015, 1:20 PM

## 2015-11-10 NOTE — Progress Notes (Signed)
Adult Psychoeducational Group Note  Date:  11/10/2015 Time:  0845   Group Topic/Focus:  Orientation:   The focus of this group is to educate the patient on the purpose and policies of crisis stabilization and provide a format to answer questions about their admission.  The group details unit policies and expectations of patients while admitted.  Participation Level:  Active  Participation Quality:  Appropriate  Affect:  Appropriate  Cognitive:  Appropriate  Insight: Appropriate  Engagement in Group:  Engaged  Modes of Intervention:  Orientation  Additional Comments:    Kyal Arts L 11/10/2015, 12:06 PM

## 2015-11-10 NOTE — Progress Notes (Signed)
Adult Psychoeducational Group Note  Date:  11/10/2015 Time:  12:23 AM  Group Topic/Focus:  Wrap-Up Group:   The focus of this group is to help patients review their daily goal of treatment and discuss progress on daily workbooks.  Participation Level:  Active  Participation Quality:  Appropriate and Sharing  Affect:  Appropriate  Cognitive:  Appropriate  Insight: Appropriate  Engagement in Group:  Engaged  Modes of Intervention:  Discussion  Additional Comments:  Pt said he had a good day, but some anxiety. Rated day an 8. Pt said he wants to be the best he can be and relate to others. Pt shared he has enjoyed his time here at [facility].  Burman Freestone 11/10/2015, 12:23 AM

## 2015-11-11 MED ORDER — ACAMPROSATE CALCIUM 333 MG PO TBEC: 666.0000 mg | DELAYED_RELEASE_TABLET | Freq: Three times a day (TID) | ORAL | Status: DC

## 2015-11-11 MED ORDER — GABAPENTIN 100 MG PO CAPS: 200.0000 mg | ORAL_CAPSULE | Freq: Three times a day (TID) | ORAL | Status: DC

## 2015-11-11 MED ORDER — TRAZODONE HCL 50 MG PO TABS: 50.0000 mg | ORAL_TABLET | Freq: Every day | ORAL | Status: DC

## 2015-11-11 MED ORDER — HYDROXYZINE HCL 25 MG PO TABS: 25.0000 mg | ORAL_TABLET | Freq: Four times a day (QID) | ORAL | Status: DC | PRN

## 2015-11-11 MED ORDER — FLUOXETINE HCL 20 MG PO CAPS: 20.0000 mg | ORAL_CAPSULE | Freq: Every day | ORAL | Status: DC

## 2015-11-11 NOTE — Progress Notes (Signed)
  [facility] Adult Case Management Discharge Plan :  Will you be returning to the same living situation after discharge:  Yes,  home with his mother At discharge, do you have transportation home?: Yes,  another pt will transport pt home Do you have the ability to pay for your medications: Yes,  mental health  Release of information consent forms completed and submitted to medical records by CSW.  Patient to Follow up at: Follow-up Information    Follow up with [facility] On 11/23/2015.   Why:  Appt on this date at 8:30AM (per patient) for medication management. (please double check appt date). Please walk in between 8am-9am Monday through Friday if you would like to be seen prior to you scheduled appt. Thank you.    Contact information:   [address], [place] [address] Phone: [phone] Fax: [phone]      Next level of care provider has access to [facility] Link:no  Safety Planning and Suicide Prevention discussed: Yes,  SPE completed with pt's mother. SPI pamphlet and mobile crisis information provided to pt.   Have you used any form of tobacco in the last 30 days? (Cigarettes, Smokeless Tobacco, Cigars, and/or Pipes): Yes  Has patient been referred to the Quitline?: Patient refused referral  Patient has been referred for addiction treatment: Pt declined   Smart, Chieko Neises LCSW 11/11/2015, 10:41 AM

## 2015-11-11 NOTE — [facility] Suicide Risk Assessment (Signed)
[facility] Discharge Suicide Risk Assessment   Principal Problem: Alcohol use disorder, severe, dependence (HCC) Discharge Diagnoses:  Patient Active Problem List   Diagnosis Date Noted  . Alcohol use disorder, severe, dependence (HCC) [F10.20] 11/05/2015  . 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 GAD (generalized anxiety disorder) [F41.1] 11/05/2015  . Alcohol dependence with uncomplicated withdrawal (HCC) [F10.230] 07/15/2015  . Cellulitis of right lower extremity [L03.115] 03/29/2015  . Alcohol dependence (HCC) [F10.20] 03/05/2012    Total Time spent with patient: 15 minutes  Musculoskeletal: Strength & Muscle Tone: within normal limits Gait & Station: normal Patient leans: normal  Psychiatric Specialty Exam: Review of Systems  Constitutional: Negative.   HENT: Negative.   Eyes: Negative.   Respiratory: Negative.   Cardiovascular: Negative.   Gastrointestinal: Negative.   Genitourinary: Negative.   Musculoskeletal: Negative.   Skin: Negative.   Neurological: Negative.   Endo/Heme/Allergies: Negative.   Psychiatric/Behavioral: Positive for substance abuse.    Blood pressure 111/70, pulse 67, temperature 97.5 F (36.4 C), temperature source Oral, resp. rate 18, height  (1.88 m), weight 82.101 kg (181 lb).Body mass index is 23.23 kg/(m^2).  General Appearance: Fairly Groomed  Patent attorney::  Fair  Speech:  Clear and Coherent409  Volume:  Normal  Mood:  Euthymic  Affect:  Appropriate  Thought Process:  Coherent and Goal Directed  Orientation:  Full (Time, Place, and Person)  Thought Content:  plans as he moves on, relapse prevention plan  Suicidal Thoughts:  No  Homicidal Thoughts:  No  Memory:  Immediate;   Fair Recent;   Fair Remote;   Fair  Judgement:  Fair  Insight:  Present  Psychomotor Activity:  Normal  Concentration:  Fair  Recall:  [employer] of Knowledge:Fair  Language: Fair  Akathisia:  No  Handed:  Right  AIMS (if  indicated):     Assets:  Desire for Improvement Housing  Sleep:  Number of Hours: 6.5  Cognition: WNL  ADL's:  Intact  IN full contact with reality. There are no active S/S of withdrawal. There are no active SI plans or intent. He states he is ready to be D/C. He is going to go back and stay with his mother. He states his employer is going to have work for him in the next couple of weeks. He states he is committed to abstinence. Mental Status Per Nursing Assessment::   On Admission:     Demographic Factors:  Male and Caucasian  Loss Factors: Financial problems/change in socioeconomic status  Historical Factors: NA  Risk Reduction Factors:   Employed, Living with another person, especially a relative and Positive social support  Continued Clinical Symptoms:  Depression:   Comorbid alcohol abuse/dependence Alcohol/Substance Abuse/Dependencies  Cognitive Features That Contribute To Risk:  Closed-mindedness, Polarized thinking and Thought constriction (tunnel vision)    Suicide Risk:  Minimal: No identifiable suicidal ideation.  Patients presenting with no risk factors but with morbid ruminations; may be classified as minimal risk based on the severity of the depressive symptoms  Follow-up Information    Follow up with [facility] On 11/23/2015.   Why:  Appt on this date at 8:30AM (per patient) for medication management. (please double check appt date). Please walk in between 8am-9am Monday through Friday if you would like to be seen prior to you scheduled appt. Thank you.    Contact information:   [address] [place] [address] Phone: [phone] Fax: [phone]      Plan Of  Care/Follow-up recommendations:  Activity:  as tolerated Diet:  regular  Nary Sneed A, MD 11/11/2015, 12:54 PM

## 2015-11-11 NOTE — Tx Team (Signed)
Interdisciplinary Treatment Plan Update (Adult)  Date:  11/11/2015  Time Reviewed:  10:50 AM   Progress in Treatment: Attending groups: Yes Participating in groups:  Yes Taking medication as prescribed:  Yes. Tolerating medication:  Yes. Family/Significant othe contact made:  SPE completed with pt's mother.  Patient understands diagnosis:  Yes. and As evidenced by:  seeking treatment for SI, depression, ETOH abuse, medication stabilization. Discussing patient identified problems/goals with staff:  Yes. Medical problems stabilized or resolved:  Yes. Denies suicidal/homicidal ideation: Yes. Issues/concerns per patient self-inventory:  Other:  Discharge Plan or Barriers: Pt lives with his mother and follows up at [facility] for mental health outpatient services. He declined any other referrals.   Reason for Continuation of Hospitalization: none  Comments:  Tim [name] is an 60 y.o. male. Pt called police about feeling that he may kill himself. Pt says he may kill himself by stepping into traffic or "suicide by cop."Patient is loud voiced. He appears to talk loudly all the time because he was not particularly agitated and was generally pleasant to interact with. Patient has been anxious and depressed for the past few weeks. He says that he feels he is not contributing to society. He says other family member "help support me because of my mental health." Patient lives with mother currently. Patient says he never seems to be able to break this cycle of using ETOH and being depressed all the time.Patient drinks four 40's or a 6-pack per day. He smokes marijuana about "a couple of joints per month." Denies current withdrawal symptoms.Patient denies any HI or A/V hallucinations.Patient has been with [facility] for [facility] services since 2000 when [facility] provided service. Patient was at [facility] in 09/16, 06/14, 05/13, 08/12. Diagnosis: MDD recurrent, severe; ETOH use d/o severe  Estimated  length of stay:  D/c today   Additional Comments:  Patient and CSW reviewed pt's identified goals and treatment plan. Patient verbalized understanding and agreed to treatment plan. CSW reviewed [facility] "Discharge Process and Patient Involvement" Form. Pt verbalized understanding of information provided and signed form.    Review of initial/current patient goals per problem list:  1. Goal(s): Patient will participate in aftercare plan  Met:Yes  Target date: at discharge  As evidenced by: Patient will participate within aftercare plan AEB aftercare provider and housing plan at discharge being identified.  1/19: Pt lives with his mother and is current with [facility]. CSW assessing.   1/25: Pt plans to return home with his mother and has appt at [facility]. Pt plans to go to [facility] for peer support and groups.   2. Goal (s): Patient will exhibit decreased depressive symptoms and suicidal ideations.  Met: Yes   Target date: at discharge  As evidenced by: Patient will utilize self rating of depression at 3 or below and demonstrate decreased signs of depression or be deemed stable for discharge by MD.  1/19: Pt reports high depression. No SI/HI/AVH today.   1/25: Pt reports depression is low today and "I feel ready to go home."   3. Goal(s): Patient will demonstrate decreased signs of withdrawal due to substance abuse  Met:Yes   Target date:at discharge   As evidenced by: Patient will produce a CIWA/COWS score of 0, have stable vitals signs, and no symptoms of withdrawal.  1/19: Pt reports no signs of withdrawal with stable vitals and CIWA score of 1.   1/25: Pt reports no signs of withdrawal with stable vitals and CIWA score of 0.  Attendees: Patient:   11/11/2015 10:50 AM   Family:   11/11/2015 10:50 AM   Physician:  Dr. Carlton Adam, MD 11/11/2015 10:50 AM   Nursing:   Francene Boyers RN  11/11/2015 10:50 AM   Clinical Social Worker: Maxie Better, LCSW 11/11/2015 10:50 AM   Clinical Social Worker: Erasmo Downer Drinkard LCSWA; Peri Maris LCSWA 11/11/2015 10:50 AM   Other:  Gerline Legacy Nurse Case Manager 11/11/2015 10:50 AM   Other:  Agustina Caroli NP 11/11/2015 10:50 AM   Other:   11/11/2015 10:50 AM   Other:  11/11/2015 10:50 AM   Other:  11/11/2015 10:50 AM   Other:  11/11/2015 10:50 AM    11/11/2015 10:50 AM    11/11/2015 10:50 AM    11/11/2015 10:50 AM    11/11/2015 10:50 AM    Scribe for Treatment Team:   Maxie Better, LCSW 11/11/2015 10:50 AM

## 2015-11-11 NOTE — Discharge Summary (Signed)
Physician's Observation Discharge Summary Note  Patient:  Tim [name] is an 60 y.o., male MRN:  [id number] DOB:  [birth year]  Patient phone:  [phone] (home)   Patient address:   [address] [place] [address],   Total Time spent with patient: Greater than 30 minutes  Date of Admission:  11/05/2015  Date of Discharge:  11-11-15  Reason for Admission:  Alcohol drinking  Principal Problem: Alcohol use disorder, severe, dependence [facility]) Discharge Diagnoses: Patient Active Problem List   Diagnosis Date Noted  . Alcohol use disorder, severe, dependence (HCC) [F10.20] 11/05/2015  . 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 GAD (generalized anxiety disorder) [F41.1] 11/05/2015  . Alcohol dependence with uncomplicated withdrawal (HCC) [F10.230] 07/15/2015  . Cellulitis of right lower extremity [L03.115] 03/29/2015  . Alcohol dependence (HCC) [F10.20] 03/05/2012   Musculoskeletal: Strength & Muscle Tone: within normal limits Gait & Station: normal Patient leans: N/A  Psychiatric Specialty Exam:   Physical Exam  Vitals reviewed. Constitutional: He is oriented to person, place, and time. He appears well-developed and well-nourished.  HENT:  Head: Normocephalic.  Eyes: Pupils are equal, round, and reactive to light.  Neck: Normal range of motion.  Cardiovascular: Normal rate and regular rhythm.   Respiratory: Effort normal and breath sounds normal.  GI: Soft.  Genitourinary:  Denies any issues in this area  Musculoskeletal: Normal range of motion.  Neurological: He is alert and oriented to person, place, and time.  Skin: Skin is warm and dry.  Psychiatric: His speech is normal and behavior is normal. Judgment and thought content normal. His mood appears not anxious. His affect is not angry, not blunt, not labile and not inappropriate. He is not withdrawn. Thought content is not paranoid and not delusional. Cognition and memory are  normal. He does not exhibit a depressed mood. He expresses no homicidal and no suicidal ideation. He expresses no suicidal plans and no homicidal plans.    Review of Systems  Constitutional: Negative.   HENT: Negative.   Eyes: Negative.   Respiratory: Negative.   Cardiovascular: Negative.   Gastrointestinal: Negative.   Genitourinary: Negative.   Musculoskeletal: Negative.   Skin: Negative.   Neurological: Negative.   Endo/Heme/Allergies: Negative.   Psychiatric/Behavioral: Positive for depression (Stable) and substance abuse (Hx. alcohol dependence). Negative for hallucinations and memory loss. The patient has insomnia (Stable). The patient is not nervous/anxious.   All other systems reviewed and are negative.   Blood pressure 111/70, pulse 67, temperature 97.5 F (36.4 C), temperature source Oral, resp. rate 18, height  (1.88 m), weight 82.101 kg (181 lb).Body mass index is 23.23 kg/(m^2).  See Md's SRA  Have you used any form of tobacco in the last 30 days? (Cigarettes, Smokeless Tobacco, Cigars, and/or Pipes): Yes  Has this patient used any form of tobacco in the last 30 days? (Cigarettes, Smokeless Tobacco, Cigars, and/or Pipes) offered smoking cessation education and resources  Past Medical History:  Past Medical History  Diagnosis Date  . Mental health disorder   . Scoliosis   . Anxiety   . Depression   . Hep C w/o coma, chronic (HCC)   . COPD (chronic obstructive pulmonary disease) (HCC)   . Alcoholism (HCC)   . Thyroid disease     Past Surgical History  Procedure Laterality Date  . Thyroid surgery    . Skin cancer excision     Family History:  Family History  Problem Relation Age of Onset  .  Dementia Mother    Social History:  History  Alcohol Use  . Yes    Comment: 4 40 oz beers per day     History  Drug Use  . Yes  . Special: Cocaine, Marijuana    Comment: also states will take pain pills every now and then, last use of cocaine and ETOH today at  1400    Social History   Social History  . Marital Status: Single    Spouse Name: N/A  . Number of Children: N/A  . Years of Education: N/A   Social History Main Topics  . Smoking status: Current Every Day Smoker -- 0.50 packs/day for 40 years    Types: Cigarettes  . Smokeless tobacco: Never Used  . Alcohol Use: Yes     Comment: 4 40 oz beers per day  . Drug Use: Yes    Special: Cocaine, Marijuana     Comment: also states will take pain pills every now and then, last use of cocaine and ETOH today at 1400  . Sexual Activity: No   Other Topics Concern  . None   Social History Narrative   ** Merged History Encounter **       Risk to Self: Is patient at risk for suicide?: No Risk to Others: No Prior Inpatient Therapy: Yes Prior Outpatient Therapy: Yes  Level of Care:  OP  Hospital Course:  60 Y/O male who states he is here for "alcoholism." Started on a binge during the holidays. Drinks couple of 40's a day yesterday drank about 6. Quit crack a year ago. States he wants help. He felt like killing himself but states he is not feeling like hurting himself anymore. He wants help.  Tim [name] was admitted to the hospital with his UDS test reports positive for [facility] & toxicology test result showing a BAL of 230. He admitted having been abusing alcohol & now wanted help to stop drinking. He required alcohol detoxification treatments. His detoxification treatment was achieved using Librium detoxification treatment protocols. Tim [name] was also enrolled and participated in the group counseling sessions & AA/NA meetings being offered and held on this unit. He learned coping skills that should help him after discharge to cope well & maintain sobriety.  Besides the detoxification treatments, Tim [name] was also medicated & discharged on Gabapentin 100 mg for agitation/substance withdrawal syndrome, Acamprosate 666 mg for alcoholism, Prozac 20 mg for depression, Hydroxyzine 25 mg for anxiety & Trazodone  50 mg Q bedtime for sleep. He presented no other significant medical issues that required treatments. He tolerated his treatment regimen without any adverse effects reported.  Tim [name] has completed detoxification treatments & his mood is stable. This is evidenced by his reports of improved mood, absence of suicidal ideations/substance withdrawal symptoms. He is currently being discharged to follow-up care on an outpatient basis as noted below. He is provided with all the necessary information needed to make this appointment without problems. Upon discharge, he adamantly denies any SIHI, AVH, delusional thoughts, paranoia and or substance withdrawal symptoms. He received from the  [facility] pharmacy, a 7 days worth, supply samples of his [facility] discharge medications. He left [facility]  with all personal belongings in no apparent distress. Transportation per his arrangement.  Consults:  psychiatry  Discharge Vitals:   Blood pressure 111/70, pulse 67, temperature 97.5 F (36.4 C), temperature source Oral, resp. rate 18, height  (1.88 m), weight 82.101 kg (181 lb). Body mass index is 23.23 kg/(m^2). Lab Results:   No  results found for this or any previous visit (from the past 72 hour(s)).  Physical Findings: AIMS: Facial and Oral Movements Muscles of Facial Expression: None, normal Lips and Perioral Area: None, normal Jaw: None, normal Tongue: None, normal,Extremity Movements Upper (arms, wrists, hands, fingers): None, normal Lower (legs, knees, ankles, toes): None, normal, Trunk Movements Neck, shoulders, hips: None, normal, Overall Severity Severity of abnormal movements (highest score from questions above): None, normal Incapacitation due to abnormal movements: None, normal Patient's awareness of abnormal movements (rate only patient's report): No Awareness, Dental Status Current problems with teeth and/or dentures?: Yes (missing teeth; no c/o pain) Does patient usually wear dentures?: No  CIWA:   CIWA-Ar Total: 1 COWS:     See Psychiatric Specialty Exam and Suicide Risk Assessment completed by Attending Physician prior to discharge.  Discharge destination:  Home  Is patient on multiple antipsychotic therapies at discharge:  No   Has Patient had three or more failed trials of antipsychotic monotherapy by history:  No  Recommended Plan for Multiple Antipsychotic Therapies: NA    Medication List    TAKE these medications      Indication   acamprosate 333 MG tablet  Commonly known as:  CAMPRAL  Take 2 tablets (666 mg total) by mouth 3 (three) times daily with meals. For alcoholism   Indication:  Excessive Use of Alcohol     FLUoxetine 20 MG capsule  Commonly known as:  PROZAC  Take 1 capsule (20 mg total) by mouth daily. For depression   Indication:  Depression     gabapentin 100 MG capsule  Commonly known as:  NEURONTIN  Take 2 capsules (200 mg total) by mouth 3 (three) times daily. For agitation/pain   Indication:  Agitation, Neuropathic Pain, Anxiety symptoms     hydrOXYzine 25 MG tablet  Commonly known as:  ATARAX/VISTARIL  Take 1 tablet (25 mg total) by mouth every 6 (six) hours as needed for anxiety.   Indication:  Anxiety     traZODone 50 MG tablet  Commonly known as:  DESYREL  Take 1 tablet (50 mg total) by mouth at bedtime. For sleep   Indication:  Trouble Sleeping       Follow-up Information    Follow up with [facility] On 11/23/2015.   Why:  Appt on this date at 8:30AM (per patient) for medication management. (please double check appt date). Please walk in between 8am-9am Monday through Friday if you would like to be seen prior to you scheduled appt. Thank you.    Contact information:   [address], [place] [address] Phone: [phone] Fax: [phone]     Follow-up recommendations: Activity:  As tolerated Diet: As recommended by your primary care doctor. Keep all scheduled follow-up appointments as recommended.   Comments: Take all your  medications as prescribed by your mental healthcare provider. Report any adverse effects and or reactions from your medicines to your outpatient provider promptly. Patient is instructed and cautioned to not engage in alcohol and or illegal drug use while on prescription medicines. In the event of worsening symptoms, patient is instructed to call the crisis hotline, 911 and or go to the nearest ED for appropriate evaluation and treatment of symptoms. Follow-up with your primary care provider for your other medical issues, concerns and or health care needs.     Total Discharge Time: Greater than 30 minutes  Signed: Armandina Stammer I PMHNP, FNP-BC 11/12/2015, 1:40 PM  I personally assessed the patient and formulated the plan  Geralyn Flash A. Sabra Heck, M.D.

## 2015-11-11 NOTE — Progress Notes (Signed)
Discharge note: Pt received both written and verbal discharge instructions. Pt verbalized understanding of discharge instructions. Pt agreed to f/u appt and med regimen. Pt received sample meds, belongings, prescriptions and (AVS, SRA, BH trans rec). Pt safely discharged to the lobby. 

## 2015-11-11 NOTE — Progress Notes (Signed)
D: Pt presents with appropriate affect and pleasant mood.  He reports he "got up, played some basketball, got to exercise earlier."  Pt reports his goal is to "pay attention in the meeting tonight."  Pt denies SI/HI, denies hallucinations, denies pain.  He attended evening group.  Pt has been interacting appropriately with peers and staff.    A: Medications administered per order.  Met with pt 1:1 and offered support and encouragement.  Actively listened to pt.   R: Pt is compliant with medications.  He verbally contracts for safety.  Will continue to monitor and assess.

## 2015-11-22 ENCOUNTER — Emergency Department (HOSPITAL_COMMUNITY)
Admission: EM | Admit: 2015-11-22 | Discharge: 2015-11-22 | Disposition: A | Discharge status: Home / Self Care | Payer: Self-pay | Attending: Emergency Medicine | Admitting: Emergency Medicine

## 2015-11-22 ENCOUNTER — Encounter (HOSPITAL_COMMUNITY): Payer: Self-pay | Admitting: Emergency Medicine

## 2015-11-22 DIAGNOSIS — J449 Chronic obstructive pulmonary disease, unspecified: Secondary | ICD-10-CM | POA: Insufficient documentation

## 2015-11-22 DIAGNOSIS — Z79899 Other long term (current) drug therapy: Secondary | ICD-10-CM | POA: Insufficient documentation

## 2015-11-22 DIAGNOSIS — F329 Major depressive disorder, single episode, unspecified: Secondary | ICD-10-CM | POA: Insufficient documentation

## 2015-11-22 DIAGNOSIS — M419 Scoliosis, unspecified: Secondary | ICD-10-CM | POA: Insufficient documentation

## 2015-11-22 DIAGNOSIS — F419 Anxiety disorder, unspecified: Secondary | ICD-10-CM | POA: Insufficient documentation

## 2015-11-22 DIAGNOSIS — F1210 Cannabis abuse, uncomplicated: Secondary | ICD-10-CM | POA: Insufficient documentation

## 2015-11-22 DIAGNOSIS — Z8639 Personal history of other endocrine, nutritional and metabolic disease: Secondary | ICD-10-CM | POA: Insufficient documentation

## 2015-11-22 DIAGNOSIS — F10230 Alcohol dependence with withdrawal, uncomplicated: Secondary | ICD-10-CM | POA: Insufficient documentation

## 2015-11-22 DIAGNOSIS — F1310 Sedative, hypnotic or anxiolytic abuse, uncomplicated: Secondary | ICD-10-CM | POA: Insufficient documentation

## 2015-11-22 DIAGNOSIS — F17210 Nicotine dependence, cigarettes, uncomplicated: Secondary | ICD-10-CM | POA: Insufficient documentation

## 2015-11-22 DIAGNOSIS — Z8619 Personal history of other infectious and parasitic diseases: Secondary | ICD-10-CM | POA: Insufficient documentation

## 2015-11-22 LAB — CBC WITH DIFFERENTIAL/PLATELET
BASOS ABS: 0.1 10*3/uL (ref 0.0–0.1)
BASOS PCT: 1 %
Eosinophils Absolute: 0.2 10*3/uL (ref 0.0–0.7)
Eosinophils Relative: 3 %
HEMATOCRIT: 42.6 % (ref 39.0–52.0)
HEMOGLOBIN: 14.5 g/dL (ref 13.0–17.0)
Lymphocytes Relative: 29 %
Lymphs Abs: 1.8 10*3/uL (ref 0.7–4.0)
MCH: 31.4 pg (ref 26.0–34.0)
MCHC: 34 g/dL (ref 30.0–36.0)
MCV: 92.2 fL (ref 78.0–100.0)
MONOS PCT: 6 %
Monocytes Absolute: 0.4 10*3/uL (ref 0.1–1.0)
NEUTROS ABS: 3.9 10*3/uL (ref 1.7–7.7)
NEUTROS PCT: 61 %
Platelets: 218 10*3/uL (ref 150–400)
RBC: 4.62 MIL/uL (ref 4.22–5.81)
RDW: 13.3 % (ref 11.5–15.5)
WBC: 6.4 10*3/uL (ref 4.0–10.5)

## 2015-11-22 LAB — COMPREHENSIVE METABOLIC PANEL
ALBUMIN: 4.2 g/dL (ref 3.5–5.0)
ALT: 20 U/L (ref 17–63)
ANION GAP: 8 (ref 5–15)
AST: 21 U/L (ref 15–41)
Alkaline Phosphatase: 69 U/L (ref 38–126)
BILIRUBIN TOTAL: 0.7 mg/dL (ref 0.3–1.2)
BUN: 13 mg/dL (ref 6–20)
CO2: 25 mmol/L (ref 22–32)
Calcium: 9.8 mg/dL (ref 8.9–10.3)
Chloride: 105 mmol/L (ref 101–111)
Creatinine, Ser: 1.03 mg/dL (ref 0.61–1.24)
GFR calc Af Amer: >60 mL/min (ref >60)
GFR calc non Af Amer: >60 mL/min (ref >60)
GLUCOSE: 85 mg/dL (ref 65–99)
POTASSIUM: 3.9 mmol/L (ref 3.5–5.1)
SODIUM: 138 mmol/L (ref 135–145)
TOTAL PROTEIN: 7.2 g/dL (ref 6.5–8.1)

## 2015-11-22 LAB — ETHANOL: Alcohol, Ethyl (B): 155 mg/dL — ABNORMAL HIGH (ref <5)

## 2015-11-22 LAB — RAPID URINE DRUG SCREEN, HOSP PERFORMED
AMPHETAMINES: NOT DETECTED
BENZODIAZEPINES: POSITIVE — AB
Barbiturates: NOT DETECTED
COCAINE: NOT DETECTED
Opiates: NOT DETECTED
Tetrahydrocannabinol: POSITIVE — AB

## 2015-11-22 MED ORDER — CHLORDIAZEPOXIDE HCL 25 MG PO CAPS: ORAL_CAPSULE | ORAL | Status: DC

## 2015-11-22 NOTE — ED Notes (Signed)
Ambulatory

## 2015-11-22 NOTE — Discharge Instructions (Signed)
Alcohol Withdrawal Alcohol withdrawal is a group of symptoms that can develop when a person who drinks heavily and regularly stops drinking or drinks less. CAUSES Heavy and regular drinking can cause chemicals that send signals from the brain to the body (neurotransmitters) to deactivate. Alcohol withdrawal develops when deactivated neurotransmitters reactivate because a person stops drinking or drinks less. RISK FACTORS The more a person drinks and the longer he or she drinks, the greater the risk of alcohol withdrawal. Severe withdrawal is more likely to develop in someone who:  Had severe alcohol withdrawal in the past.  Had a seizure during a previous episode of alcohol withdrawal.  Is elderly.  Is pregnant.  Has been abusing drugs.  Has other medical problems, including:  Infection.  Heart, lung, or liver disease.  Seizures.  Mental health problems. SYMPTOMS Symptoms of this condition can be mild to moderate, or they can be severe. Mild to moderate symptoms may include:  Fatigue.  Nightmares.  Trouble sleeping.  Depression.  Anxiety.  Inability to think clearly.  Mood swings.  Irritability.  Loss of appetite.  Nausea or vomiting.  Clammy skin.  Extreme sweating.  Rapid heartbeat.  Shakiness.  Uncontrollable shaking (tremor). Severe symptoms may include:  Fever.  Seizures.  Severeconfusion.  Feeling or seeing things that are not there (hallucinations). Symptoms usually begin within eight hours after a person stops drinking or drinks less. They can last for weeks. DIAGNOSIS Alcohol withdrawal is diagnosed with a medical history and physical exam. Sometimes, urine and blood tests are also done. TREATMENT Treatment may involve:  Monitoring blood pressure, pulse, and breathing.  Getting fluids through an IV tube.  Medicine to reduce anxiety.  Medicine to prevent or control seizures.  Multivitamins and B vitamins.  Having a health  care provider check on you daily. If symptoms are moderate to severe or if there is a risk of severe withdrawal, treatment may be done at a hospital or treatment center. HOME CARE INSTRUCTIONS  Take medicines and vitamin supplements only as directed by your health care provider.  Do not drink alcohol.  Have someone stay with you or be available if you need help.  Drink enough fluid to keep your urine clear or pale yellow.  Consider joining a 12-step program or another alcohol support group. SEEK MEDICAL CARE IF:  Your symptoms get worse or do not go away.  You cannot keep food or water in your stomach.  You are struggling with not drinking alcohol.  You cannot stop drinking alcohol. SEEK IMMEDIATE MEDICAL CARE IF:   You have an irregular heartbeat.  You have chest pain.  You have trouble breathing.  You have symptoms of severe withdrawal, such as:  A fever.  Seizures.  Severe confusion.  Hallucinations.   This information is not intended to replace advice given to you by your health care provider. Make sure you discuss any questions you have with your health care provider.   Document Released: 07/13/2005 Document Revised: 10/24/2014 Document Reviewed: 07/22/2014 Elsevier Interactive Patient Education 2016 [employer].  Finding Treatment for Addiction WHAT IS ADDICTION? Addiction is a complex disease of the brain. It causes an uncontrollable (compulsive) need for a substance. You can be addicted to alcohol, illegal drugs, or prescription medicines such as painkillers. Addiction can also be a behavior, like gambling or shopping. The need for the drug or activity can become so strong that you think about it all the time. You can also become physically dependent on a substance. Addiction  can change the way your brain works. Because of these changes, getting more of whatever you are addicted to becomes the most important thing to you and feels better than other activities  or relationships. Addiction can lead to changes in health, behavior, emotions, relationships, and choices that affect you and everyone around you. HOW DO I KNOW IF I NEED TREATMENT FOR ADDICTION? Addiction is a progressive disease. Without treatment, addiction can get worse. Living with addiction puts you at higher risk for injury, poor health, lost employment, loss of money, and even death. You might need treatment for addiction if:  You have tried to stop or cut down, but you cannot.  Your addiction is causing physical health problems.  You find it annoying that your friends and family are concerned about your alcohol or substance use.  You feel guilty about substance abuse or a compulsive behavior.  You have lied or tried to hide your addiction.  You need a particular substance or activity to start your day or to calm down.  You are getting in trouble at school, work, home, or with the police.  You have done something illegal to support your addiction.  You are running out of money because of your addiction.  You have no time for anything other than your addiction. WHAT TYPES OF TREATMENT ARE AVAILABLE? The treatment program that is right for you will depend on many factors, including the type of addiction you have. Treatment programs can be outpatient or inpatient. In an outpatient program, you live at home and go to work or school, but you also go to a clinic for treatment. With an inpatient program, you live and sleep at the program facility during treatment. After treatment, you might need a plan for support during recovery. Other treatment options include:   Medicine.  Some addictions may be treated with prescription medicines.  You might also need medicine to treat anxiety or depression.  Counseling and behavior therapy. Therapy can help individuals and families behave in healthier ways and relate more effectively.  Support groups. Confidential group therapy, such as a  12-step program, can help individuals and families during treatment and recovery. No single type of program is right for everyone. Many treatment programs involve a combination of education, counseling, and a 12-step, spiritually-based approach. Some treatment programs are government sponsored. They are geared for patients who do not have private insurance. Treatment programs can vary in many respects, such as:  Cost and types of insurance that are accepted.  Types of on-site medical services that are offered.  Length of stay, setting, and size.  Overall philosophy of treatment. WHAT SHOULD I CONSIDER WHEN SELECTING A TREATMENT PROGRAM? It is important to think about your individual requirements when selecting a treatment program. There are a number of things to consider, such as:  If the program is certified by the appropriate government agency. Even private programs must be certified and employ certified professionals.  If the program is covered by your insurance. If finances are a concern, the first call you should make is to your insurance company, if you have health insurance. Ask for a list of treatment programs that are in your network, and confirm any copayments and deductibles that you may have to pay.  If you do not have insurance, or if you choose to attend a program that does not accept your insurance, discuss whether a payment plan can be set up.  If treatment is available in languages other than English, if needed.  If the program offers detoxification treatment, if needed.  If 12-step meetings are held at the center or if transport is available for patients to attend meetings at other locations.  If the program is professional, organized, and clean.  If the program meets all of your needs, including physical and cultural needs.  If the facility offers specific treatment for your particular addiction.  If support continues to be offered after you have left the  program.  If your treatment plan is continually looked at to make sure you are receiving the right treatment at the right time.  If mental health counseling is part of your treatment.  If medicine is included in treatment, if needed.  If your family is included in your treatment plan and if support is offered to them throughout the treatment process.  How the treatment works to prevent relapse. WHERE ELSE CAN I GET HELP?  Your health care provider. Ask him or her to help you find addiction treatment. These discussions are confidential.  The [employer] on Alcoholism and Drug Dependence (NCADD). This group has information about treatment centers and programs for people who have an addiction and for family members.  The telephone number is 1-800-NCA-CALL (854-094-0592).  The website is https://ncadd.org/about-ncadd/our-affiliates  The Substance Abuse and Mental Health Services Administration [facility]). This group will help you find publicly funded treatment centers, help hotlines, and counseling services near you.  The telephone number is 1-800-662-HELP (819 316 6472).  The website is www.findtreatment.[employer].pl In countries outside of the [place]. and [place], look in [employer] for contact information for services in your area.   This information is not intended to replace advice given to you by your health care provider. Make sure you discuss any questions you have with your health care provider.   Document Released: 09/01/2005 Document Revised: 06/24/2015 Document Reviewed: 07/22/2014 Elsevier Interactive Patient Education [employer]

## 2015-11-22 NOTE — ED Provider Notes (Signed)
CSN: [id number]     Arrival date & time 11/22/15  0328 History   First MD Initiated Contact with Patient 11/22/15 0421     Chief Complaint  Patient presents with  . alcohol detox   . Anxiety     (Consider location/radiation/quality/duration/timing/severity/associated sxs/prior Treatment) HPI   Patient with a past medical history of mental health disorder, scoliosis, anxiety, depression, hep C, COPD, alcoholism or thyroid disease presents to the emergency department requesting detox from alcohol and treatment for his anxiety. He recently stayed in behavioral health on January 25 for 5 days. He reports things sober for 1 week until he relapsed. He says that he was around his enablers and fell into temptation. He reports having a difficult time finding a job and this creates a lot of anxiety in his life. He says that when he does not have alcohol in his system he gets very shaky, but has never had DTs or seizures or any serious complications from alcohol withdrawal in the past. He denies feeling suicidal or homicidal, he denies hallucinations or delusions. He does admit to smoking marijuana but denies any other illicit drugs    Past Medical History  Diagnosis Date  . Mental health disorder   . Scoliosis   . Anxiety   . Depression   . Hep C w/o coma, chronic (HCC)   . COPD (chronic obstructive pulmonary disease) (HCC)   . Alcoholism (HCC)   . Thyroid disease    Past Surgical History  Procedure Laterality Date  . Thyroid surgery    . Skin cancer excision     Family History  Problem Relation Age of Onset  . Dementia Mother    Social History  Substance Use Topics  . Smoking status: Current Every Day Smoker -- 0.50 packs/day for 40 years    Types: Cigarettes  . Smokeless tobacco: Never Used  . Alcohol Use: Yes     Comment: 4 40 oz beers per day    Review of Systems  Review of Systems All other systems negative except as documented in the HPI. All pertinent positives and  negatives as reviewed in the HPI.   Allergies  Review of patient's allergies indicates no known allergies.  Home Medications   Prior to Admission medications   Medication Sig Start Date End Date Taking? Authorizing Provider  acamprosate (CAMPRAL) 333 MG tablet Take 2 tablets (666 mg total) by mouth 3 (three) times daily with meals. For alcoholism 11/11/15  Yes Sanjuana Kava, NP  FLUoxetine (PROZAC) 20 MG capsule Take 1 capsule (20 mg total) by mouth daily. For depression 11/11/15  Yes Sanjuana Kava, NP  gabapentin (NEURONTIN) 100 MG capsule Take 2 capsules (200 mg total) by mouth 3 (three) times daily. For agitation/pain 11/11/15  Yes Sanjuana Kava, NP  hydrOXYzine (ATARAX/VISTARIL) 25 MG tablet Take 1 tablet (25 mg total) by mouth every 6 (six) hours as needed for anxiety. 11/11/15  Yes Sanjuana Kava, NP  traZODone (DESYREL) 50 MG tablet Take 1 tablet (50 mg total) by mouth at bedtime. For sleep 11/11/15  Yes Sanjuana Kava, NP  chlordiazePOXIDE (LIBRIUM) 25 MG capsule  PO TID x 1D, then 25-50mg  PO BID X 1D, then 25-50mg  PO QD X 1D 11/22/15   Maynard David, PA-C   BP 104/71 mmHg  Pulse 115  Temp(Src) 97.8 F (36.6 C) (Oral)  Resp 19  SpO2 94% Physical Exam  Constitutional: He appears well-developed and well-nourished. No distress.  HENT:  Head: Normocephalic  and atraumatic.  Right Ear: Tympanic membrane and ear canal normal.  Left Ear: Tympanic membrane and ear canal normal.  Nose: Nose normal.  Mouth/Throat: Uvula is midline, oropharynx is clear and moist and mucous membranes are normal.  Eyes: Pupils are equal, round, and reactive to light.  Neck: Normal range of motion. Neck supple.  Cardiovascular: Normal rate and regular rhythm.   Pulmonary/Chest: Effort normal.  Abdominal: Soft.  No signs of abdominal distention  Neurological: He is alert.  Skin: Skin is warm and dry. No rash noted.  Psychiatric: His mood appears not anxious. His speech is not rapid and/or pressured. He is  not actively hallucinating. He does not exhibit a depressed mood. He expresses no homicidal and no suicidal ideation. He expresses no suicidal plans and no homicidal plans.  Nursing note and vitals reviewed.   ED Course  Procedures (including critical care time) Labs Review Labs Reviewed  ETHANOL - Abnormal; Notable for the following:    Alcohol, Ethyl (B) 155 (*)    All other components within normal limits  URINE RAPID DRUG SCREEN, HOSP PERFORMED - Abnormal; Notable for the following:    Benzodiazepines POSITIVE (*)    Tetrahydrocannabinol POSITIVE (*)    All other components within normal limits  COMPREHENSIVE METABOLIC PANEL  CBC WITH DIFFERENTIAL/PLATELET    Imaging Review No results found. I have personally reviewed and evaluated these images and lab results as part of my medical decision-making.   EKG Interpretation None      MDM   Final diagnoses:  Alcohol dependence with uncomplicated withdrawal (HCC)    the patient recently stayed at a inpatient's facility for suicidal ideation and alcohol treatment. He relapsed after one week. He currently is not having any suicidal, homicidal ideations and he denies having history of DTs or seizures from alcohol withdrawal. He does not meet the criteria to be placed in a inpatient facility from the emergency department. He'll be given a small Librium taper as well as resources so that he can try to find placement for himself. He is a given strict return to the emergency precautions.  At discharge patients pulse is steady between 80-85.   Marlon Pel, PA-C 11/22/15 4098  April Palumbo, MD 11/22/15 [phone]

## 2015-11-22 NOTE — ED Notes (Signed)
Pt. Came in for alcohol detox, last drink was an hour ago prior to coming to ED, pt. Also stated that he has some anxiety . Denies SI nor HI. Cooperative. Pt. Stated that he was admitted at [facility] last Jan.25 and was on 5 day detox. Came here today for help.

## 2015-12-23 ENCOUNTER — Encounter (HOSPITAL_COMMUNITY): Payer: Self-pay | Admitting: Vascular Surgery

## 2015-12-23 ENCOUNTER — Emergency Department (HOSPITAL_COMMUNITY): Payer: Self-pay

## 2015-12-23 ENCOUNTER — Emergency Department (HOSPITAL_COMMUNITY)
Admission: EM | Admit: 2015-12-23 | Discharge: 2015-12-23 | Disposition: A | Discharge status: Home / Self Care | Payer: Self-pay | Attending: Emergency Medicine | Admitting: Emergency Medicine

## 2015-12-23 DIAGNOSIS — K4090 Unilateral inguinal hernia, without obstruction or gangrene, not specified as recurrent: Secondary | ICD-10-CM | POA: Insufficient documentation

## 2015-12-23 DIAGNOSIS — Z79899 Other long term (current) drug therapy: Secondary | ICD-10-CM | POA: Insufficient documentation

## 2015-12-23 DIAGNOSIS — M419 Scoliosis, unspecified: Secondary | ICD-10-CM | POA: Insufficient documentation

## 2015-12-23 DIAGNOSIS — F1010 Alcohol abuse, uncomplicated: Secondary | ICD-10-CM | POA: Insufficient documentation

## 2015-12-23 DIAGNOSIS — M25561 Pain in right knee: Secondary | ICD-10-CM | POA: Insufficient documentation

## 2015-12-23 DIAGNOSIS — Z8639 Personal history of other endocrine, nutritional and metabolic disease: Secondary | ICD-10-CM | POA: Insufficient documentation

## 2015-12-23 DIAGNOSIS — Z8619 Personal history of other infectious and parasitic diseases: Secondary | ICD-10-CM | POA: Insufficient documentation

## 2015-12-23 DIAGNOSIS — F17210 Nicotine dependence, cigarettes, uncomplicated: Secondary | ICD-10-CM | POA: Insufficient documentation

## 2015-12-23 DIAGNOSIS — R05 Cough: Secondary | ICD-10-CM

## 2015-12-23 DIAGNOSIS — F329 Major depressive disorder, single episode, unspecified: Secondary | ICD-10-CM | POA: Insufficient documentation

## 2015-12-23 DIAGNOSIS — M25562 Pain in left knee: Secondary | ICD-10-CM | POA: Insufficient documentation

## 2015-12-23 DIAGNOSIS — J449 Chronic obstructive pulmonary disease, unspecified: Secondary | ICD-10-CM | POA: Insufficient documentation

## 2015-12-23 DIAGNOSIS — R059 Cough, unspecified: Secondary | ICD-10-CM

## 2015-12-23 DIAGNOSIS — F419 Anxiety disorder, unspecified: Secondary | ICD-10-CM | POA: Insufficient documentation

## 2015-12-23 NOTE — ED Notes (Signed)
MD at bedside. 

## 2015-12-23 NOTE — ED Notes (Addendum)
Pt. Came back inside from smoking and walking around waiting room.

## 2015-12-23 NOTE — ED Notes (Signed)
Pt reports to the ED for eval of multiple complaints. 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 Pt has hx of hernia and states it has gotten larger and is causing him more pain. Pt denies any difficulty urinating or having BMs. Pt denies any known injury to his knees. Pt A&Ox4, resp e/u, and skin warm and dry.

## 2015-12-23 NOTE — ED Provider Notes (Signed)
CSN: [id number]     Arrival date & time 12/23/15  1554 History   First MD Initiated Contact with Patient 12/23/15 2116     Chief Complaint  Patient presents with  . Hernia     (Consider location/radiation/quality/duration/timing/severity/associated sxs/prior Treatment) HPI  60 year old male presents with multiple complaints. First complaint is a cough that he has had for at least 2 weeks. Occasionally productive for the most part has no sputum. No shortness of breath. Has a history of COPD and is a smoker. Denies fevers. No chest pain. Second complaint is a slowly growing right inguinal hernia. He states this is been growing for some time and has had the hernia overall for 10 years. No pain, abdominal pain, vomiting, or constipation. Last complaint is bilateral knee pain. This is also been ongoing for several months. 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 Denies any knee swelling. No injury. No weakness/numbness. Does not take anything for the pain.  Patient walked outside to smoke while in the waiting room. When he came back he stated he feels like he was going through alcohol withdrawal. When I'm talking to patient he states he feels fine. He states earlier he got the shakes for "a little bit" and currently does not feel like he is going through withdrawal. Has never been through withdrawal. Last drink was this morning.  Past Medical History  Diagnosis Date  . Mental health disorder   . Scoliosis   . Anxiety   . Depression   . Hep C w/o coma, chronic (HCC)   . COPD (chronic obstructive pulmonary disease) (HCC)   . Alcoholism (HCC)   . Thyroid disease    Past Surgical History  Procedure Laterality Date  . Thyroid surgery    . Skin cancer excision     Family History  Problem Relation Age of Onset  . Dementia Mother    Social History  Substance Use Topics  . Smoking status: Current Every Day Smoker  -- 0.50 packs/day for 40 years    Types: Cigarettes  . Smokeless tobacco: Never Used  . Alcohol Use: Yes     Comment: 4 40 oz beers per day    Review of Systems  Constitutional: Negative for fever.  Respiratory: Positive for cough. Negative for shortness of breath.   Cardiovascular: Negative for chest pain.  Gastrointestinal: Negative for vomiting, abdominal pain and constipation.  Musculoskeletal: Positive for arthralgias. Negative for joint swelling.  All other systems reviewed and are negative.     Allergies  Review of patient's allergies indicates no known allergies.  Home Medications   Prior to Admission medications   Medication Sig Start Date End Date Taking? Authorizing Provider  acamprosate (CAMPRAL) 333 MG tablet Take 2 tablets (666 mg total) by mouth 3 (three) times daily with meals. For alcoholism 11/11/15  Yes Sanjuana KavaAgnes I Nwoko, NP  FLUoxetine (PROZAC) 20 MG capsule Take 1 capsule (20 mg total) by mouth daily. For depression 11/11/15  Yes Sanjuana KavaAgnes I Nwoko, NP  gabapentin (NEURONTIN) 100 MG capsule Take 2 capsules (200 mg total) by mouth 3 (three) times daily. For agitation/pain 11/11/15  Yes Sanjuana KavaAgnes I Nwoko, NP  hydrOXYzine (ATARAX/VISTARIL) 25 MG tablet Take 1 tablet (25 mg total) by mouth every 6 (six) hours as needed for anxiety. 11/11/15  Yes Sanjuana KavaAgnes I Nwoko, NP  traZODone (DESYREL) 50 MG tablet Take 1 tablet (50 mg total)  by mouth at bedtime. For sleep 11/11/15  Yes Sanjuana Kava, NP  chlordiazePOXIDE (LIBRIUM) 25 MG capsule  PO TID x 1D, then 25-50mg  PO BID X 1D, then 25-50mg  PO QD X 1D 11/22/15   Tiffany Greene, PA-C   BP 101/70 mmHg  Pulse 70  Temp(Src) 98.3 F (36.8 C) (Oral)  Resp 18  SpO2 97% Physical Exam  Constitutional: He is oriented to person, place, and time. He appears well-developed and well-nourished.  HENT:  Head: Normocephalic and atraumatic.  Right Ear: External ear normal.  Left Ear: External ear normal.  Nose: Nose normal.  Eyes: Right eye exhibits  no discharge. Left eye exhibits no discharge.  Neck: Neck supple.  Cardiovascular: Normal rate, regular rhythm, normal heart sounds and intact distal pulses.   Pulses:      Dorsalis pedis pulses are 2+ on the right side, and 2+ on the left side.  Pulmonary/Chest: Effort normal and breath sounds normal. He has no wheezes.  Abdominal: Soft. He exhibits no distension. There is no tenderness. A hernia is present. Hernia confirmed positive in the right inguinal area (soft, easily reducible).  Genitourinary: Testes normal and penis normal.  Musculoskeletal: He exhibits no edema.       Right knee: He exhibits normal range of motion and no swelling. No tenderness found.       Left knee: He exhibits normal range of motion and no swelling. No tenderness found.  Neurological: He is alert and oriented to person, place, and time. He displays no tremor.  Skin: Skin is warm and dry.  Nursing note and vitals reviewed.   ED Course  Procedures (including critical care time) Labs Review Labs Reviewed - No data to display  Imaging Review Dg Chest 2 View  12/23/2015  CLINICAL DATA:  Productive cough EXAM: CHEST  2 VIEW COMPARISON:  03/09/2015 FINDINGS: Cardiac shadow is within normal limits. The lungs are hyperinflated. Diffuse interstitial changes are again noted. No focal infiltrate or sizable effusion is seen. No bony abnormality is noted. IMPRESSION: COPD without acute abnormality. Electronically Signed   By: Alcide Clever M.D.   On: 12/23/2015 16:31   I have personally reviewed and evaluated these images and lab results as part of my medical decision-making.   EKG Interpretation None      MDM   Final diagnoses:  Right inguinal hernia  Cough  Bilateral knee pain  Alcohol abuse    Patient's cough is likely from smoking. No wheezing, hypoxia or dyspnea. CXR negative. Hernia is soft and reducible, appears to be a chronic issue, will refer to surgery as outpatient. Knees are unremarkable at this  time. No tenderness or swelling. Don't feel xrays would be beneficial as I think this is likely OA. No signs of withdrawal right now, no tremors, tachycardia, etc. F/u with a PCP, discussed return precautions.    Pricilla Loveless, MD 12/24/15 [phone]

## 2015-12-23 NOTE — ED Notes (Signed)
Pt answer in waiting room for recheck vitals

## 2015-12-27 ENCOUNTER — Encounter (HOSPITAL_COMMUNITY): Payer: Self-pay | Admitting: Emergency Medicine

## 2015-12-27 ENCOUNTER — Emergency Department (HOSPITAL_COMMUNITY)
Admission: EM | Admit: 2015-12-27 | Discharge: 2015-12-28 | Disposition: A | Discharge status: Other Acute Inpatient Hospital | Payer: Self-pay | Attending: Emergency Medicine | Admitting: Emergency Medicine

## 2015-12-27 DIAGNOSIS — F10220 Alcohol dependence with intoxication, uncomplicated: Secondary | ICD-10-CM | POA: Diagnosis present

## 2015-12-27 DIAGNOSIS — M419 Scoliosis, unspecified: Secondary | ICD-10-CM | POA: Insufficient documentation

## 2015-12-27 DIAGNOSIS — F1010 Alcohol abuse, uncomplicated: Secondary | ICD-10-CM

## 2015-12-27 DIAGNOSIS — R45851 Suicidal ideations: Secondary | ICD-10-CM

## 2015-12-27 DIAGNOSIS — F10230 Alcohol dependence with withdrawal, uncomplicated: Secondary | ICD-10-CM

## 2015-12-27 DIAGNOSIS — J449 Chronic obstructive pulmonary disease, unspecified: Secondary | ICD-10-CM | POA: Insufficient documentation

## 2015-12-27 DIAGNOSIS — F17210 Nicotine dependence, cigarettes, uncomplicated: Secondary | ICD-10-CM | POA: Insufficient documentation

## 2015-12-27 DIAGNOSIS — Z79899 Other long term (current) drug therapy: Secondary | ICD-10-CM | POA: Insufficient documentation

## 2015-12-27 DIAGNOSIS — F419 Anxiety disorder, unspecified: Secondary | ICD-10-CM | POA: Insufficient documentation

## 2015-12-27 DIAGNOSIS — F331 Major depressive disorder, recurrent, moderate: Secondary | ICD-10-CM

## 2015-12-27 DIAGNOSIS — F411 Generalized anxiety disorder: Secondary | ICD-10-CM | POA: Diagnosis present

## 2015-12-27 DIAGNOSIS — Z8639 Personal history of other endocrine, nutritional and metabolic disease: Secondary | ICD-10-CM | POA: Insufficient documentation

## 2015-12-27 DIAGNOSIS — Z8619 Personal history of other infectious and parasitic diseases: Secondary | ICD-10-CM | POA: Insufficient documentation

## 2015-12-27 DIAGNOSIS — F1020 Alcohol dependence, uncomplicated: Secondary | ICD-10-CM

## 2015-12-27 NOTE — ED Notes (Signed)
Pt states he is suicidal with plan to cut his wrists with glass.  States he is suicidal because he is an alcoholic and if he keeps doing what he is doing, he wants to die.  Denies HI.

## 2015-12-28 DIAGNOSIS — R45851 Suicidal ideations: Secondary | ICD-10-CM

## 2015-12-28 DIAGNOSIS — F1020 Alcohol dependence, uncomplicated: Secondary | ICD-10-CM

## 2015-12-28 LAB — COMPREHENSIVE METABOLIC PANEL
ALT: 17 U/L (ref 17–63)
AST: 20 U/L (ref 15–41)
Albumin: 4.6 g/dL (ref 3.5–5.0)
Alkaline Phosphatase: 77 U/L (ref 38–126)
Anion gap: 9 (ref 5–15)
BUN: 13 mg/dL (ref 6–20)
CO2: 26 mmol/L (ref 22–32)
Calcium: 10.3 mg/dL (ref 8.9–10.3)
Chloride: 107 mmol/L (ref 101–111)
Creatinine, Ser: 0.84 mg/dL (ref 0.61–1.24)
GFR calc Af Amer: >60 mL/min (ref >60)
GFR calc non Af Amer: >60 mL/min (ref >60)
Glucose, Bld: 95 mg/dL (ref 65–99)
Potassium: 4.3 mmol/L (ref 3.5–5.1)
Sodium: 142 mmol/L (ref 135–145)
Total Bilirubin: 0.5 mg/dL (ref 0.3–1.2)
Total Protein: 7.8 g/dL (ref 6.5–8.1)

## 2015-12-28 LAB — CBC
HCT: 46.4 % (ref 39.0–52.0)
Hemoglobin: 16.2 g/dL (ref 13.0–17.0)
MCH: 31.6 pg (ref 26.0–34.0)
MCHC: 34.9 g/dL (ref 30.0–36.0)
MCV: 90.4 fL (ref 78.0–100.0)
Platelets: 201 10*3/uL (ref 150–400)
RBC: 5.13 MIL/uL (ref 4.22–5.81)
RDW: 13.4 % (ref 11.5–15.5)
WBC: 7 10*3/uL (ref 4.0–10.5)

## 2015-12-28 LAB — RAPID URINE DRUG SCREEN, HOSP PERFORMED
Amphetamines: NOT DETECTED
Barbiturates: NOT DETECTED
Benzodiazepines: NOT DETECTED
Cocaine: NOT DETECTED
Opiates: NOT DETECTED
Tetrahydrocannabinol: NOT DETECTED

## 2015-12-28 LAB — ACETAMINOPHEN LEVEL: Acetaminophen (Tylenol), Serum: <10 ug/mL — ABNORMAL LOW (ref 10–30)

## 2015-12-28 LAB — ETHANOL: Alcohol, Ethyl (B): 97 mg/dL — ABNORMAL HIGH (ref <5)

## 2015-12-28 LAB — SALICYLATE LEVEL: Salicylate Lvl: <4 mg/dL (ref 2.8–30.0)

## 2015-12-28 MED ORDER — NICOTINE 21 MG/24HR TD PT24
21.0000 mg | MEDICATED_PATCH | Freq: Every day | TRANSDERMAL | Status: DC
  Administered 2015-12-28: 21 mg via TRANSDERMAL
  Filled 2015-12-28: qty 1

## 2015-12-28 MED ORDER — ONDANSETRON HCL 4 MG PO TABS: 4.0000 mg | ORAL_TABLET | Freq: Three times a day (TID) | ORAL | Status: DC | PRN

## 2015-12-28 MED ORDER — FLUOXETINE HCL 20 MG PO CAPS
20.0000 mg | ORAL_CAPSULE | Freq: Every day | ORAL | Status: DC
  Administered 2015-12-28: 20 mg via ORAL
  Filled 2015-12-28: qty 1

## 2015-12-28 MED ORDER — ALUM & MAG HYDROXIDE-SIMETH 200-200-20 MG/5ML PO SUSP: 30.0000 mL | ORAL | Status: DC | PRN

## 2015-12-28 MED ORDER — GABAPENTIN 100 MG PO CAPS
200.0000 mg | ORAL_CAPSULE | Freq: Three times a day (TID) | ORAL | Status: DC
  Administered 2015-12-28 (×2): 200 mg via ORAL
  Filled 2015-12-28 (×2): qty 2

## 2015-12-28 MED ORDER — LORAZEPAM 1 MG PO TABS
1.0000 mg | ORAL_TABLET | Freq: Three times a day (TID) | ORAL | Status: DC | PRN
  Administered 2015-12-28: 1 mg via ORAL
  Filled 2015-12-28: qty 1

## 2015-12-28 MED ORDER — VITAMIN B-1 100 MG PO TABS
100.0000 mg | ORAL_TABLET | Freq: Every day | ORAL | Status: DC
  Administered 2015-12-28: 100 mg via ORAL
  Filled 2015-12-28: qty 1

## 2015-12-28 MED ORDER — TRAZODONE HCL 50 MG PO TABS: 50.0000 mg | ORAL_TABLET | Freq: Every day | ORAL | Status: DC

## 2015-12-28 MED ORDER — IBUPROFEN 200 MG PO TABS
600.0000 mg | ORAL_TABLET | Freq: Three times a day (TID) | ORAL | Status: DC | PRN
Start: 2015-12-28 — End: 2015-12-28

## 2015-12-28 MED ORDER — THIAMINE HCL 100 MG/ML IJ SOLN: 100.0000 mg | Freq: Every day | INTRAMUSCULAR | Status: DC
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
## 2015-12-28 NOTE — BH Assessment (Signed)
[facility] Assessment Progress Note  At 15:05 Tim [name] at [name] calls about this pt.  Pt has been accepted to their facility by Tim [name].  Tim MeansJamison Lord, NP concurs with this decision.  This Clinical research associatewriter spoke to pt, who is under voluntary status, and he agrees to transfer.  Pt's nurse has been notified, and agrees to call report to [phone] as pt is being prepared to depart.  [facility].  Yoshi Vicencio, MA Triage Specialist [phone]

## 2015-12-28 NOTE — Consult Note (Signed)
[place] Psychiatry Consult   Reason for Consult:  Depression Referring Physician:  EPD Patient Identification: Tim [name] MRN:  [id number] Principal Diagnosis: Alcohol use disorder, severe, dependence ([place]) Diagnosis:   Patient Active Problem List   Diagnosis Date Noted  . Alcohol use disorder, severe, dependence ([place]) [F10.20] 11/05/2015  . Depression, major, recurrent, moderate ([place]) [F33.1] 11/05/2015  . GAD (generalized anxiety disorder) [F41.1] 11/05/2015  . Alcohol dependence with uncomplicated withdrawal ([place]) [F10.230] 07/15/2015  . Cellulitis of right lower extremity [L03.115] 03/29/2015  . Alcohol dependence ([place]) [F10.20] 03/05/2012    Total Time spent with patient: 30 minutes  Subjective:   Tim [name] is a 60 y.o. male patient admitted with Depression. Patient is awake and alert. With history of depression and alcohol abuse. Per treatment assessment patient will benefit from psychiatry observation and medication management.  HPI: Per Tele Assessment Note-Tim [name] is a caucasian, single 60 y.o. male voluntarily presenting to [facility] c/o worsening depression with SI and chronic alcohol abuse. Pt says that he is "stuck in a cycle of drinking and depression". He expresses that he is unhappy with his life and "went on another alcohol binge". Pt states that he lives with his mother and feels a lack of independence, as if he will never be able to care for himself due to his alcoholism and depression. He reports drinking three 40 oz beers PTA. BAL is 97 and UDS is negative for substances. He says he drinks 7 days a week and is unable to stop drinking. Pt also endorses "occasional" marijuana use a few times per month. He denies any other drug use to this Probation officer. He reports depressive sx including hopelessness, sleep disturbance, feeling worthless, feeling guilty, increased irritability, and loss of interest in previously enjoyed activities. He reports a hx of 3 past  suicide attempts "in my youth". He reports that he is currently experiencing SI with a plan to slit his wrists with glass, "or some other way, I'm not sure how I'd do it". Pt has a hx of multiple inpt admissions to [facility], most recently discharged from [facility] on 11/05/15. Pt is followed by [facility] for med management. He reports compliance with his psychiatric medications. He denies HI, self-harming behaviors, and A/VH.   Past Psychiatric History: See Above  Risk to Self: Suicidal Ideation: Yes-Currently Present Suicidal Intent: Yes-Currently Present Is patient at risk for suicide?: Yes Suicidal Plan?: Yes-Currently Present Specify Current Suicidal Plan: Cut wrists with piece of glass Access to Means: Yes Specify Access to Suicidal Means: Anything sharp What has been your use of drugs/alcohol within the last 12 months?: Daily etoh use, regular marijuana use How many times?: 3 Other Self Harm Risks: SA Triggers for Past Attempts: Unpredictable Intentional Self Injurious Behavior: None Risk to Others: Homicidal Ideation: No Thoughts of Harm to Others: No Current Homicidal Intent: No Current Homicidal Plan: No Access to Homicidal Means: No Identified Victim: n/a History of harm to others?: Yes Assessment of Violence: In distant past Violent Behavior Description: Fights in the past Does patient have access to weapons?: No Criminal Charges Pending?: No Does patient have a court date: No Prior Inpatient Therapy: Prior Inpatient Therapy: Yes Prior Therapy Dates: Multiple (most recently in Jan 2017) Prior Therapy Facilty/Provider(s): [facility] Reason for Treatment: SA, SI Prior Outpatient Therapy: Prior Outpatient Therapy: Yes Prior Therapy Dates: Ongoing Prior Therapy Facilty/Provider(s): [facility] Reason for Treatment: Med management Does patient have an ACCT team?: No Does patient have Intensive In-House Services?  : No  Does patient have [facility] services? : Yes Does patient have P4CC services?:  No  Past Medical History:  Past Medical History  Diagnosis Date  . Mental health disorder   . Scoliosis   . Anxiety   . Depression   . Hep C w/o coma, chronic ([place])   . COPD (chronic obstructive pulmonary disease) ([place])   . Alcoholism ([place])   . Thyroid disease     Past Surgical History  Procedure Laterality Date  . Thyroid surgery    . Skin cancer excision     Family History:  Family History  Problem Relation Age of Onset  . Dementia Mother    Family Psychiatric  History: See Above Social History:  History  Alcohol Use  . Yes    Comment: 4 40 oz beers per day     History  Drug Use  . Yes  . Special: Cocaine, Marijuana    Comment: also states will take pain pills every now and then, last use of cocaine and ETOH today at 1400    Social History   Social History  . Marital Status: Single    Spouse Name: N/A  . Number of Children: N/A  . Years of Education: N/A   Social History Main Topics  . Smoking status: Current Every Day Smoker -- 0.50 packs/day for 40 years    Types: Cigarettes  . Smokeless tobacco: Never Used  . Alcohol Use: Yes     Comment: 4 40 oz beers per day  . Drug Use: Yes    Special: Cocaine, Marijuana     Comment: also states will take pain pills every now and then, last use of cocaine and ETOH today at 1400  . Sexual Activity: No   Other Topics Concern  . None   Social History Narrative   ** Merged History Encounter **       Additional Social History:    Allergies:  No Known Allergies  Labs:  Results for orders placed or performed during the hospital encounter of 12/27/15 (from the past 48 hour(s))  Comprehensive metabolic panel     Status: None   Collection Time: 12/28/15 12:14 AM  Result Value Ref Range   Sodium 142 135 - 145 mmol/L   Potassium 4.3 3.5 - 5.1 mmol/L   Chloride 107 101 - 111 mmol/L   CO2 26 22 - 32 mmol/L   Glucose, Bld 95 65 - 99 mg/dL   BUN 13 6 - 20 mg/dL   Creatinine, Ser 0.84 0.61 - 1.24 mg/dL   Calcium  10.3 8.9 - 10.3 mg/dL   Total Protein 7.8 6.5 - 8.1 g/dL   Albumin 4.6 3.5 - 5.0 g/dL   AST 20 15 - 41 U/L   ALT 17 17 - 63 U/L   Alkaline Phosphatase 77 38 - 126 U/L   Total Bilirubin 0.5 0.3 - 1.2 mg/dL   GFR calc non Af Amer >60 >60 mL/min   GFR calc Af Amer >60 >60 mL/min    Comment: (NOTE) The eGFR has been calculated using the CKD EPI equation. This calculation has not been validated in all clinical situations. eGFR's persistently <60 mL/min signify possible Chronic Kidney Disease.    Anion gap 9 5 - 15  Ethanol (ETOH)     Status: Abnormal   Collection Time: 12/28/15 12:14 AM  Result Value Ref Range   Alcohol, Ethyl (B) 97 (H) <5 mg/dL    Comment:        LOWEST DETECTABLE  LIMIT FOR SERUM ALCOHOL IS 5 mg/dL FOR MEDICAL PURPOSES ONLY   Salicylate level     Status: None   Collection Time: 12/28/15 12:14 AM  Result Value Ref Range   Salicylate Lvl <6.0 2.8 - 30.0 mg/dL  Acetaminophen level     Status: Abnormal   Collection Time: 12/28/15 12:14 AM  Result Value Ref Range   Acetaminophen (Tylenol), Serum <10 (L) 10 - 30 ug/mL    Comment:        THERAPEUTIC CONCENTRATIONS VARY SIGNIFICANTLY. A RANGE OF 10-30 ug/mL MAY BE AN EFFECTIVE CONCENTRATION FOR MANY PATIENTS. HOWEVER, SOME ARE BEST TREATED AT CONCENTRATIONS OUTSIDE THIS RANGE. ACETAMINOPHEN CONCENTRATIONS >150 ug/mL AT 4 HOURS AFTER INGESTION AND >50 ug/mL AT 12 HOURS AFTER INGESTION ARE OFTEN ASSOCIATED WITH TOXIC REACTIONS.   CBC     Status: None   Collection Time: 12/28/15 12:14 AM  Result Value Ref Range   WBC 7.0 4.0 - 10.5 K/uL   RBC 5.13 4.22 - 5.81 MIL/uL   Hemoglobin 16.2 13.0 - 17.0 g/dL   HCT 46.4 39.0 - 52.0 %   MCV 90.4 78.0 - 100.0 fL   MCH 31.6 26.0 - 34.0 pg   MCHC 34.9 30.0 - 36.0 g/dL   RDW 13.4 11.5 - 15.5 %   Platelets 201 150 - 400 K/uL  Urine rapid drug screen (hosp performed) (Not at [facility])     Status: None   Collection Time: 12/28/15 12:18 AM  Result Value Ref Range   Opiates  NONE DETECTED NONE DETECTED   Cocaine NONE DETECTED NONE DETECTED   Benzodiazepines NONE DETECTED NONE DETECTED   Amphetamines NONE DETECTED NONE DETECTED   Tetrahydrocannabinol NONE DETECTED NONE DETECTED   Barbiturates NONE DETECTED NONE DETECTED    Comment:        DRUG SCREEN FOR MEDICAL PURPOSES ONLY.  IF CONFIRMATION IS NEEDED FOR ANY PURPOSE, NOTIFY LAB WITHIN 5 DAYS.        LOWEST DETECTABLE LIMITS FOR URINE DRUG SCREEN Drug Class       Cutoff (ng/mL) Amphetamine      1000 Barbiturate      200 Benzodiazepine   630 Tricyclics       160 Opiates          300 Cocaine          300 THC              50     Current Facility-Administered Medications  Medication Dose Route Frequency Provider Last Rate Last Dose  . alum & mag hydroxide-simeth (MAALOX/MYLANTA) 200-200-20 MG/5ML suspension 30 mL  30 mL Oral PRN Waynetta Pean, PA-C      . FLUoxetine (PROZAC) capsule 20 mg  20 mg Oral Daily Waynetta Pean, PA-C   20 mg at 12/28/15 1145  . gabapentin (NEURONTIN) capsule 200 mg  200 mg Oral TID Waynetta Pean, PA-C   200 mg at 12/28/15 1145  . ibuprofen (ADVIL,MOTRIN) tablet 600 mg  600 mg Oral Q8H PRN Waynetta Pean, PA-C      . LORazepam (ATIVAN) tablet 1 mg  1 mg Oral Q8H PRN Waynetta Pean, PA-C      . nicotine (NICODERM CQ - dosed in mg/24 hours) patch 21 mg  21 mg Transdermal Daily Waynetta Pean, PA-C      . ondansetron [facility]) tablet 4 mg  4 mg Oral Q8H PRN Waynetta Pean, PA-C      . thiamine (VITAMIN B-1) tablet 100 mg  100 mg Oral Daily Gwyndolyn Saxon  Dansie, PA-C   100 mg at 12/28/15 1145   Or  . thiamine (B-1) injection 100 mg  100 mg Intravenous Daily Waynetta Pean, PA-C      . traZODone (DESYREL) tablet 50 mg  50 mg Oral QHS Waynetta Pean, PA-C       Current Outpatient Prescriptions  Medication Sig Dispense Refill  . FLUoxetine (PROZAC) 20 MG capsule Take 1 capsule (20 mg total) by mouth daily. For depression 30 capsule 0  . gabapentin (NEURONTIN) 100 MG capsule Take 2  capsules (200 mg total) by mouth 3 (three) times daily. For agitation/pain 180 capsule 0  . traZODone (DESYREL) 50 MG tablet Take 1 tablet (50 mg total) by mouth at bedtime. For sleep 30 tablet 0  . acamprosate (CAMPRAL) 333 MG tablet Take 2 tablets (666 mg total) by mouth 3 (three) times daily with meals. For alcoholism (Patient not taking: Reported on 12/23/2015) 180 tablet 0  . chlordiazePOXIDE (LIBRIUM) 25 MG capsule 3m PO TID x 1D, then 25-564mPO BID X 1D, then 25-5073mO QD X 1D (Patient not taking: Reported on 12/23/2015) 10 capsule 0  . hydrOXYzine (ATARAX/VISTARIL) 25 MG tablet Take 1 tablet (25 mg total) by mouth every 6 (six) hours as needed for anxiety. (Patient not taking: Reported on 12/27/2015) 60 tablet 0    Musculoskeletal: Strength & Muscle Tone: within normal limits Gait & Station: normal Patient leans: N/A  Psychiatric Specialty Exam: Review of Systems  Psychiatric/Behavioral: Positive for depression. The patient is nervous/anxious.   All other systems reviewed and are negative.   Blood pressure 118/83, pulse 59, temperature 97.8 F (36.6 C), temperature source Oral, resp. rate 20, SpO2 100 %.There is no weight on file to calculate BMI.  General Appearance: Casual  Eye Contact::  Fair  Speech:  Clear and Coherent  Volume:  Normal  Mood:  Depressed  Affect:  Congruent  Thought Process:  Coherent  Orientation:  Full (Time, Place, and Person)  Thought Content:  Hallucinations: None  Suicidal Thoughts:  Yes.  with intent/plan  Homicidal Thoughts:  No  Memory:  Immediate;   Fair Recent;   Fair Remote;   Fair  Judgement:  Fair  Insight:  Fair  Psychomotor Activity:  Restlessness  Concentration:  Fair  Recall:  [employer] Knowledge:Fair  Language: Good  Akathisia:  No  Handed:  Right  AIMS (if indicated):     Assets:  Resilience  ADL's:  Intact  Cognition: WNL  Sleep:       I agree with current treatment plan on 12/27/2015, Patient  for psychiatric  evaluation follow-up, chart reviewed and case discussed with the MD Yehya Brendle, Advanced Practice Provider and Treatment team. Reviewed the information documented and agree with the treatment plan.  Treatment Plan Summary: Daily contact with patient to assess and evaluate symptoms and progress in treatment and Medication management  -Crisis stabilization -Medication management: Prozac 23m37mr mood stabilization, Neurontin 200 mg PO TID  Trazodone 50 mg at bedtime for sleep PRN,  -Individual counseling  Disposition: Recommend psychiatric observation unit when medically cleared   [facility] 12/28/2015 12:34 PM Patient seen face-to-face for psychiatric evaluation, chart reviewed and case discussed with the physician extender and developed treatment plan. Reviewed the information documented and agree with the treatment plan. MojeCorena Pilgrim

## 2015-12-28 NOTE — ED Notes (Signed)
Pt oriented to room and unit.  Pt contracts for safety and said he was ready to give up drinking.  Pt is alert and oriented.  No signs or symptoms of withdrawel.  15 minute checks and video monitoring continue.

## 2015-12-28 NOTE — ED Notes (Signed)
Pt discharged ambulatory with [place] driver.  Pt was in no distress at discharge.  All belongings were sent with patient.

## 2015-12-28 NOTE — ED Notes (Signed)
Patient has three bags of belongings in locker 26. 

## 2015-12-28 NOTE — ED Provider Notes (Signed)
TTS reports pt needs inpatient treatment.  They will try to find placement for patient  Tim AreasLeslie K Basil Buffin, Cordelia Poche-C 12/28/15 [phone]  Raeford RazorStephen Kohut, MD 12/28/15 [phone]

## 2015-12-28 NOTE — ED Provider Notes (Signed)
CSN: [id number]     Arrival date & time 12/27/15  2223 History   First MD Initiated Contact with Patient 12/27/15 2342     Chief Complaint  Patient presents with  . Suicidal    Tim [name] is a 60 y.o. male with a history of anxiety, depression and alcoholism who presents to the emergency department complaining of suicidal ideations. The patient reports he's been having suicidal ideations over the past week that it worsened today. He reports a plan to cut his wrist or jump in front of a train. Patient also admits to using alcohol tonight. He denies illicit substance abuse tonight. He reports he is feeling like he is not contributing to society. He denies visual or auditory hallucinations. He denies new physical complaints. He is followed by [facility]. He denies fevers, chest pain, shortness of breath, abdominal pain, nausea, vomiting, diarrhea or rashes.  The history is provided by the patient. No language interpreter was used.    Past Medical History  Diagnosis Date  . Mental health disorder   . Scoliosis   . Anxiety   . Depression   . Hep C w/o coma, chronic ([facility])   . COPD (chronic obstructive pulmonary disease) ([facility])   . Alcoholism ([facility])   . Thyroid disease    Past Surgical History  Procedure Laterality Date  . Thyroid surgery    . Skin cancer excision     Family History  Problem Relation Age of Onset  . Dementia Mother    Social History  Substance Use Topics  . Smoking status: Current Every Day Smoker -- 0.50 packs/day for 40 years    Types: Cigarettes  . Smokeless tobacco: Never Used  . Alcohol Use: Yes     Comment: 4 40 oz beers per day    Review of Systems  Constitutional: Negative for fever and chills.  HENT: Negative for congestion and sore throat.   Eyes: Negative for visual disturbance.  Respiratory: Negative for cough and shortness of breath.   Cardiovascular: Negative for chest pain and palpitations.  Gastrointestinal: Negative for nausea, vomiting,  abdominal pain and diarrhea.  Genitourinary: Negative for dysuria.  Musculoskeletal: Negative for back pain and neck pain.  Skin: Negative for rash.  Neurological: Negative for headaches.  Psychiatric/Behavioral: Positive for suicidal ideas and dysphoric mood. Negative for hallucinations, sleep disturbance, self-injury and agitation. The patient is nervous/anxious.       Allergies  Review of patient's allergies indicates no known allergies.  Home Medications   Prior to Admission medications   Medication Sig Start Date End Date Taking? Authorizing Provider  FLUoxetine (PROZAC) 20 MG capsule Take 1 capsule (20 mg total) by mouth daily. For depression 11/11/15  Yes Sanjuana KavaAgnes I Nwoko, NP  gabapentin (NEURONTIN) 100 MG capsule Take 2 capsules (200 mg total) by mouth 3 (three) times daily. For agitation/pain 11/11/15  Yes Sanjuana KavaAgnes I Nwoko, NP  traZODone (DESYREL) 50 MG tablet Take 1 tablet (50 mg total) by mouth at bedtime. For sleep 11/11/15  Yes Sanjuana KavaAgnes I Nwoko, NP  acamprosate (CAMPRAL) 333 MG tablet Take 2 tablets (666 mg total) by mouth 3 (three) times daily with meals. For alcoholism Patient not taking: Reported on 12/23/2015 11/11/15   Sanjuana KavaAgnes I Nwoko, NP  chlordiazePOXIDE (LIBRIUM) 25 MG capsule 50mg  PO TID x 1D, then 25-50mg  PO BID X 1D, then 25-50mg  PO QD X 1D Patient not taking: Reported on 12/23/2015 11/22/15   Marlon Peliffany Greene, PA-C  hydrOXYzine (ATARAX/VISTARIL) 25 MG tablet Take 1 tablet (  25 mg total) by mouth every 6 (six) hours as needed for anxiety. Patient not taking: Reported on 12/27/2015 11/11/15   Sanjuana Kava, NP   BP 93/69 mmHg  Pulse 64  Temp(Src) 98 F (36.7 C) (Oral)  Resp 20  SpO2 95% Physical Exam  Constitutional: He is oriented to person, place, and time. He appears well-developed and well-nourished. No distress.  Nontoxic appearing. Smells slightly of alcohol.  HENT:  Head: Normocephalic and atraumatic.  Right Ear: External ear normal.  Left Ear: External ear normal.   Mouth/Throat: Oropharynx is clear and moist.  Eyes: Conjunctivae are normal. Pupils are equal, round, and reactive to light. Right eye exhibits no discharge. Left eye exhibits no discharge.  Neck: Neck supple.  Cardiovascular: Normal rate, regular rhythm, normal heart sounds and intact distal pulses.   Pulmonary/Chest: Effort normal and breath sounds normal. No respiratory distress. He has no wheezes. He has no rales.  Abdominal: Soft. There is no tenderness.  Neurological: He is alert and oriented to person, place, and time. Coordination normal.  Alert and oriented 3.  Skin: Skin is warm and dry. No rash noted. He is not diaphoretic. No erythema. No pallor.  Psychiatric: His behavior is normal. His speech is tangential. He is not actively hallucinating. He exhibits a depressed mood. He expresses suicidal ideation. He expresses no homicidal ideation.  Patient endorses feeling depressed. He is tangential. He does not appear to be responding to internal stimuli. He endorses suicidal ideations with a plan to cut his wrists or jump in front of a train. He denies homicidal ideations. He denies illicit substance abuse.  Nursing note and vitals reviewed.   ED Course  Procedures (including critical care time) Labs Review Labs Reviewed  ETHANOL - Abnormal; Notable for the following:    Alcohol, Ethyl (B) 97 (*)    All other components within normal limits  COMPREHENSIVE METABOLIC PANEL  SALICYLATE LEVEL  CBC  URINE RAPID DRUG SCREEN, HOSP PERFORMED  ACETAMINOPHEN LEVEL    Imaging Review No results found. I have personally reviewed and evaluated these lab results as part of my medical decision-making.   EKG Interpretation None      Filed Vitals:   12/27/15 2242  BP: 93/69  Pulse: 64  Temp: 98 F (36.7 C)  TempSrc: Oral  Resp: 20  SpO2: 95%     MDM   Meds given in ED:  Medications  thiamine (VITAMIN B-1) tablet 100 mg (not administered)    Or  thiamine (B-1) injection  100 mg (not administered)  alum & mag hydroxide-simeth (MAALOX/MYLANTA) 200-200-20 MG/5ML suspension 30 mL (not administered)  ondansetron (ZOFRAN) tablet 4 mg (not administered)  nicotine (NICODERM CQ - dosed in mg/24 hours) patch 21 mg (not administered)  ibuprofen (ADVIL,MOTRIN) tablet 600 mg (not administered)  LORazepam (ATIVAN) tablet 1 mg (not administered)    New Prescriptions   No medications on file    Final diagnoses:  Suicidal ideations  Alcohol abuse   This  is a 60 y.o. male with a history of anxiety, depression and alcoholism who presents to the emergency department complaining of suicidal ideations. The patient reports he's been having suicidal ideations over the past week that it worsened today. He reports a plan to cut his wrist or jump in front of a train. Patient also admits to using alcohol tonight. He denies illicit substance abuse tonight. He reports he is feeling like he is not contributing to society. He denies visual or auditory hallucinations. He  denies new physical complaints.  On exam he is afebrile nontoxic appearing. He endorses suicidal ideations with a plan to jump in front of traffic or cut his wrists. Medical clearance labs are remarkable only for an alcohol level of 97. Patient is medically clear for behavioral health admission. Psychiatric holding orders placed.    Everlene Farrier, PA-C 12/28/15 0217  Raeford Razor, MD 12/28/15 [phone]

## 2016-04-02 ENCOUNTER — Emergency Department (HOSPITAL_COMMUNITY): Payer: Self-pay

## 2016-04-02 ENCOUNTER — Emergency Department (HOSPITAL_COMMUNITY)
Admission: EM | Admit: 2016-04-02 | Discharge: 2016-04-02 | Disposition: A | Discharge status: Home / Self Care | Payer: Self-pay | Attending: Emergency Medicine | Admitting: Emergency Medicine

## 2016-04-02 ENCOUNTER — Encounter (HOSPITAL_COMMUNITY): Payer: Self-pay | Admitting: Emergency Medicine

## 2016-04-02 DIAGNOSIS — Y999 Unspecified external cause status: Secondary | ICD-10-CM | POA: Insufficient documentation

## 2016-04-02 DIAGNOSIS — F17210 Nicotine dependence, cigarettes, uncomplicated: Secondary | ICD-10-CM | POA: Insufficient documentation

## 2016-04-02 DIAGNOSIS — W11XXXA Fall on and from ladder, initial encounter: Secondary | ICD-10-CM | POA: Insufficient documentation

## 2016-04-02 DIAGNOSIS — Y939 Activity, unspecified: Secondary | ICD-10-CM | POA: Insufficient documentation

## 2016-04-02 DIAGNOSIS — F329 Major depressive disorder, single episode, unspecified: Secondary | ICD-10-CM | POA: Insufficient documentation

## 2016-04-02 DIAGNOSIS — M545 Low back pain: Secondary | ICD-10-CM | POA: Insufficient documentation

## 2016-04-02 DIAGNOSIS — F1020 Alcohol dependence, uncomplicated: Secondary | ICD-10-CM

## 2016-04-02 DIAGNOSIS — M25552 Pain in left hip: Secondary | ICD-10-CM | POA: Insufficient documentation

## 2016-04-02 DIAGNOSIS — Y929 Unspecified place or not applicable: Secondary | ICD-10-CM | POA: Insufficient documentation

## 2016-04-02 DIAGNOSIS — F10239 Alcohol dependence with withdrawal, unspecified: Secondary | ICD-10-CM | POA: Insufficient documentation

## 2016-04-02 DIAGNOSIS — J449 Chronic obstructive pulmonary disease, unspecified: Secondary | ICD-10-CM | POA: Insufficient documentation

## 2016-04-02 MED ORDER — LORAZEPAM 1 MG PO TABS
1.0000 mg | ORAL_TABLET | Freq: Once | ORAL | Status: AC
  Administered 2016-04-02: 1 mg via ORAL
  Filled 2016-04-02: qty 1

## 2016-04-02 MED ORDER — ONDANSETRON 4 MG PO TBDP: 4.0000 mg | ORAL_TABLET | Freq: Three times a day (TID) | ORAL | Status: DC | PRN

## 2016-04-02 MED ORDER — CHLORDIAZEPOXIDE HCL 25 MG PO CAPS: ORAL_CAPSULE | ORAL | Status: DC

## 2016-04-02 NOTE — ED Notes (Signed)
[facility]  They left and pt has went outside.  States he is here for detox from etoh.

## 2016-04-02 NOTE — ED Notes (Signed)
Patient arrives requesting detox for ETOH and polysubstance abuse. States he has been on a binge for about 4 months. Last drink tonight around midnight. Reports falling from a 6 foot ladder about 1 week ago. States that he is sore and achy all over.

## 2016-04-02 NOTE — ED Notes (Signed)
Pt has returned.  

## 2016-04-02 NOTE — Discharge Instructions (Signed)
Take the medications prescribed to help with the alcohol withdrawals. DO NOT MIX THE MEDICATIONS PRESCRIBED WITH ALCOHOL - that can lead to severe complications for you.  Utilize the outpatient resources provided.  Community Resource Guide Outpatient Counseling/Substance Abuse Adult The [employer] 211 is a great source of information about community services available.  Access by dialing 2-1-1 from anywhere in [place], or by website -  PooledIncome.pl.   Other Local Resources (Updated 10/2015)  Crisis Hotlines   Services     Area Served  [employer]  Crisis Hotline, available 24 hours a day, 7 days a week: (667)834-9860 [facility], [place]   [facility]  Crisis Hotline, available 24 hours a day, 7 days a week: 203-125-2551 [facility], [place]  [facility]  Suicide Prevention Hotline, available 24 hours a day, 7 days a week: (903)074-8215 [facility], [place]  [employer], available 24 hours a day, 7 days a week: [phone] [facility], [place]   [facility] Access to [employer], available 24 hours a day, 7 days a week: (906) 128-1583 All   [facility]  Crisis Hotline, available 24 hours a day, 7 days a week: 402-276-6855 All   Other Local Resources (Updated 10/2015)  Outpatient Counseling/ Substance Abuse Programs  Services     Address and Phone Number  ADS (Alcohol and Drug Services)   Options include Individual counseling, group counseling, intensive outpatient program (several hours a day, several days a week)  Offers depression assessments  Provides methadone maintenance program [phone] [name], [address] [place], [place] [address]   [facility]   Offers partial hospitalization/day treatment and DUI/DWI programs  [employer], private insurance [phone] [address] [place], [place] [address]  [facility]    Services  include intensive outpatient program (several hours a day, several days a week), outpatient treatment, DUI/DWI services, family education  Also has some services specifically for [employer] transitional housing  [phone] [address] [place], [place] [address]     [facility]  [employer], private pay, and private insurance [phone] [address], [address] [place], [place] [address]  [employer] [facility]  Services include individual counseling, substance abuse intensive outpatient program (several hours a day, several days a week), day treatment  Delene Loll, Medicaid, private insurance [phone] [address], [address] [place], [place] [address]  [facility]   Offers substance abuse intensive outpatient program (several hours a day, several days a week), partial hospitalization program [phone] [address] [place], [place] [address]  [phone] [address] [place], [place] [address]  [phone] [address] [place], [place] [address]  [phone], [address] [place], [place] [address]  [facility]  Individual counseling only  Accepts private insurance only [phone] [address], [address] [place], [place] [address]  [facility]  Methadone maintenance program [phone] [address] [place], [place] [address]  [facility]  Walk-In Clinic providing substance abuse and mental health counseling  Accepts Medicaid, Medicare, private insurance  Offers sliding scale for uninsured [phone] [address] [place], [place]   [facility] [place], [employer].  Offers individual counseling, and intensive in-home services [phone] [address], [address] [place], [place] [address]  [facility] individual counseling, family counseling, group therapy, domestic violence  counseling, consumer credit counseling  Accepts Medicare, Medicaid,  private insurance  Offers sliding scale for uninsured [phone] [address] [place], [place] [address]  [phone] [facility], [address] [place], [place] [address]  [facility]  Offers individual, family and group counseling  3 locations - [place], [place], and [place]  [phone]  [address] [place], [place] [address]  [address] [place], [place] [address]  [address] [place], [place] [address]  [facility]    Offers psychiatric assessment, 8-week Intensive Outpatient Program (several hours a day, several times a week, daytime or evenings), early recovery group, family Program, medication management  Private pay or private insurance only [phone], or  [phone] [address] [place], [place] [address]  [facility]  Offers individual, couples and family counseling  Accepts Medicaid, private insurance, and sliding scale for uninsured [phone] [address] [place], [place] [address]  Len Blalockavid Fuller, MD  Individual counseling  Private insurance [phone] [address] [place], [place] [address]  [facility]   Offers assessment, substance abuse treatment, and behavioral health treatment [phone] [address] [place], [place] [address]  [facility]  Individual counseling  Accepts private insurance [phone] [address] [place], [place] [address]  [facility]  Individual counseling  Delene Lollccepts Medicare, private insurance [phone] [address] [place], [place] [address]  [facility]    Offers intensive outpatient program (several hours a day, several times a week)  Private pay, private insurance [phone] [facility] [address] [facility] [place], [place]  [facility]  Individual counseling  Medicare, private insurance  [phone] [address], [address] [place], [place] [address]  [facility]    Offers intensive outpatient program (several hours a day, several times a week) and partial hospitalization program [phone] [address] [place], [place] [address]  Emerson MonteParrish McKinney, MD  Individual counseling [phone] [address], [address] [place], [place] [address]  [facility]  Offers Christian counseling to individuals, couples, and families  Accepts Medicare and private insurance; offers sliding scale for uninsured [phone] [address] [place], [place] [address]  [facility]  [facility] [phone] [address], [address] [place], [place] [address]  [facility]   Offers crisis counseling, individual counseling, group therapy, in-home therapy, domestic violence services, day treatment, DWI services, Administrator, artsCommunity Support Team (CST), Assertive Community Treatment Team (ACTT), substance abuse Intensive Outpatient Program (several hours a day, several times a week)  2 locations - [place] and [place] [phone] [address] [place], [place] [address]  [phone] [address] [place], [place] [address]  [facility]     Individual counseling and group therapy  Accepts private insurance, [place], [place] [phone] [address] [place], [place]  [facility]  Offers individual and family counseling  Offers LGBTQ services  Accepts private insurance and private pay [phone] [address] [place], [place] [address]  [facility]    Offers individual counseling, group therapy, and outpatient detox  Accepts private insurance [phone] [address] [place], [place]  [facility]  Individual counseling  Accepts Medicare, private insurance [phone] [address], [address] [place], [place] [address]  [facility]  Individual counseling  Accepts Medicare, private insurance [phone] [address] [place], [place] [address]  [facility]   Offers substance abuse Intensive Outpatient Program (several hours a day, several times a week) [phone], or [phone] [place], [place]   Alcohol  Withdrawal Alcohol withdrawal is a group of symptoms that can develop when a person who drinks heavily and regularly stops drinking or drinks less. CAUSES Heavy and regular drinking can cause chemicals that send signals from the brain to the body (neurotransmitters) to deactivate. Alcohol withdrawal develops when deactivated neurotransmitters reactivate because a person stops drinking or drinks less. RISK FACTORS The more a person drinks and the longer he or she drinks, the greater the risk of alcohol withdrawal. Severe withdrawal is more likely to develop in someone who:  Had severe alcohol withdrawal in the past.  Had a seizure during a previous episode of alcohol withdrawal.  Is elderly.  Is pregnant.  Has been abusing drugs.  Has other medical problems, including:  Infection.  Heart, lung, or liver disease.  Seizures.  Mental health problems. SYMPTOMS Symptoms of this condition can be mild to moderate, or they can be severe. Mild to moderate symptoms may include:  Fatigue.  Nightmares.  Trouble sleeping.  Depression.  Anxiety.  Inability to think clearly.  Mood swings.  Irritability.  Loss of appetite.  Nausea or vomiting.  Clammy skin.  Extreme sweating.  Rapid heartbeat.  Shakiness.  Uncontrollable shaking (tremor). Severe symptoms may include:  Fever.  Seizures.  Severeconfusion.  Feeling or seeing things that are not there (hallucinations). Symptoms usually begin within eight hours after a person stops drinking or drinks less. They can last for weeks. DIAGNOSIS Alcohol withdrawal is  diagnosed with a medical history and physical exam. Sometimes, urine and blood tests are also done. TREATMENT Treatment may involve:  Monitoring blood pressure, pulse, and breathing.  Getting fluids through an IV tube.  Medicine to reduce anxiety.  Medicine to prevent or control seizures.  Multivitamins and B vitamins.  Having a health care provider check on you daily. If symptoms are moderate to severe or if there is a risk of severe withdrawal, treatment may be done at a hospital or treatment center. HOME CARE INSTRUCTIONS  Take medicines and vitamin supplements only as directed by your health care provider.  Do not drink alcohol.  Have someone stay with you or be available if you need help.  Drink enough fluid to keep your urine clear or pale yellow.  Consider joining a 12-step program or another alcohol support group. SEEK MEDICAL CARE IF:  Your symptoms get worse or do not go away.  You cannot keep food or water in your stomach.  You are struggling with not drinking alcohol.  You cannot stop drinking alcohol. SEEK IMMEDIATE MEDICAL CARE IF:   You have an irregular heartbeat.  You have chest pain.  You have trouble breathing.  You have symptoms of severe withdrawal, such as:  A fever.  Seizures.  Severe confusion.  Hallucinations.   This information is not intended to replace advice given to you by your health care provider. Make sure you discuss any questions you have with your health care provider.   Document Released: 07/13/2005 Document Revised: 10/24/2014 Document Reviewed: 07/22/2014 Elsevier Interactive Patient Education 2016 [employer]. Alcohol Intoxication Alcohol intoxication occurs when the amount of alcohol that a person has consumed impairs his or her ability to mentally and physically function. Alcohol directly impairs the normal chemical activity of the brain. Drinking large amounts of alcohol can lead to changes in mental function and  behavior, and it can cause many physical effects that can be harmful.  Alcohol intoxication can range in severity from mild to very severe. Various factors can affect the level  of intoxication that occurs, such as the person's age, gender, weight, frequency of alcohol consumption, and the presence of other medical conditions (such as diabetes, seizures, or heart conditions). Dangerous levels of alcohol intoxication may occur when people drink large amounts of alcohol in a short period (binge drinking). Alcohol can also be especially dangerous when combined with certain prescription medicines or "recreational" drugs. SIGNS AND SYMPTOMS Some common signs and symptoms of mild alcohol intoxication include:  Loss of coordination.  Changes in mood and behavior.  Impaired judgment.  Slurred speech. As alcohol intoxication progresses to more severe levels, other signs and symptoms will appear. These may include:  Vomiting.  Confusion and impaired memory.  Slowed breathing.  Seizures.  Loss of consciousness. DIAGNOSIS  Your health care provider will take a medical history and perform a physical exam. You will be asked about the amount and type of alcohol you have consumed. Blood tests will be done to measure the concentration of alcohol in your blood. In many places, your blood alcohol level must be lower than 80 mg/dL (9.51%) to legally drive. However, many dangerous effects of alcohol can occur at much lower levels.  TREATMENT  People with alcohol intoxication often do not require treatment. Most of the effects of alcohol intoxication are temporary, and they go away as the alcohol naturally leaves the body. Your health care provider will monitor your condition until you are stable enough to go home. Fluids are sometimes given through an IV access tube to help prevent dehydration.  HOME CARE INSTRUCTIONS  Do not drive after drinking alcohol.  Stay hydrated. Drink enough water and fluids to  keep your urine clear or pale yellow. Avoid caffeine.   Only take over-the-counter or prescription medicines as directed by your health care provider.  SEEK MEDICAL CARE IF:   You have persistent vomiting.   You do not feel better after a few days.  You have frequent alcohol intoxication. Your health care provider can help determine if you should see a substance use treatment counselor. SEEK IMMEDIATE MEDICAL CARE IF:   You become shaky or tremble when you try to stop drinking.   You shake uncontrollably (seizure).   You throw up (vomit) blood. This may be bright red or may look like black coffee grounds.   You have blood in your stool. This may be bright red or may appear as a black, tarry, bad smelling stool.   You become lightheaded or faint.  MAKE SURE YOU:   Understand these instructions.  Will watch your condition.  Will get help right away if you are not doing well or get worse.   This information is not intended to replace advice given to you by your health care provider. Make sure you discuss any questions you have with your health care provider.   Document Released: 07/13/2005 Document Revised: 06/05/2013 Document Reviewed: 03/08/2013 Elsevier Interactive Patient Education [employer]

## 2016-04-02 NOTE — ED Provider Notes (Signed)
CSN: [id number]     Arrival date & time 04/02/16  0413 History   First MD Initiated Contact with Patient 04/02/16 [phone]     Chief Complaint  Patient presents with  . Alcohol Problem  . Medical Clearance     (Consider location/radiation/quality/duration/timing/severity/associated sxs/prior Treatment) HPI Comments: Pt with hx of alcoholism and mental illness comes in with cc of detox. Pt reports that he has been binge eating for 4 months now and has decided to stop. He admits to drinking until miodnight. Pt denies nausea, emesis, fevers, chills, chest pains, shortness of breath, headaches, abdominal pain, uti like symptoms. Pt does endorse pain in his hip, back and leg from an injury he sustained from a fall. Pt has no SI, hallucinations.   ROS 10 Systems reviewed and are negative for acute change except as noted in the HPI.   Patient is a 61 y.o. male presenting with alcohol problem. The history is provided by the patient.  Alcohol Problem    Past Medical History  Diagnosis Date  . Mental health disorder   . Scoliosis   . Anxiety   . Depression   . Hep C w/o coma, chronic (HCC)   . COPD (chronic obstructive pulmonary disease) (HCC)   . Alcoholism (HCC)   . Thyroid disease    Past Surgical History  Procedure Laterality Date  . Thyroid surgery    . Skin cancer excision     Family History  Problem Relation Age of Onset  . Dementia Mother    Social History  Substance Use Topics  . Smoking status: Current Every Day Smoker -- 0.50 packs/day for 40 years    Types: Cigarettes  . Smokeless tobacco: Never Used  . Alcohol Use: Yes     Comment: 4 40 oz beers per day    Review of Systems    Allergies  Review of patient's allergies indicates no known allergies.  Home Medications   Prior to Admission medications   Medication Sig Start Date End Date Taking? Authorizing Provider  FLUoxetine (PROZAC) 20 MG capsule Take 1 capsule (20 mg total) by mouth daily. For depression  11/11/15  Yes Sanjuana Kava, NP  gabapentin (NEURONTIN) 100 MG capsule Take 2 capsules (200 mg total) by mouth 3 (three) times daily. For agitation/pain 11/11/15  Yes Sanjuana Kava, NP  traZODone (DESYREL) 50 MG tablet Take 1 tablet (50 mg total) by mouth at bedtime. For sleep 11/11/15  Yes Sanjuana Kava, NP  acamprosate (CAMPRAL) 333 MG tablet Take 2 tablets (666 mg total) by mouth 3 (three) times daily with meals. For alcoholism Patient not taking: Reported on 12/23/2015 11/11/15   Sanjuana Kava, NP  chlordiazePOXIDE (LIBRIUM) 25 MG capsule  PO TID x 1D, then 25-50mg  PO BID X 1D, then 25-50mg  PO QD X 1D 04/02/16   Derwood Kaplan, MD  hydrOXYzine (ATARAX/VISTARIL) 25 MG tablet Take 1 tablet (25 mg total) by mouth every 6 (six) hours as needed for anxiety. Patient not taking: Reported on 12/27/2015 11/11/15   Sanjuana Kava, NP  ondansetron (ZOFRAN ODT) 4 MG disintegrating tablet Take 1 tablet (4 mg total) by mouth every 8 (eight) hours as needed for nausea or vomiting. 04/02/16   Derwood Kaplan, MD   BP 107/76 mmHg  Pulse 76  Temp(Src) 97.9 F (36.6 C) (Oral)  Resp 18  SpO2 94% Physical Exam  Constitutional: He is oriented to person, place, and time. He appears well-developed.  HENT:  Head: Atraumatic.  Neck:  Neck supple.  Cardiovascular: Normal rate.   Pulmonary/Chest: Effort normal.  Musculoskeletal:  Pt has tenderness over the lumbar region and L hip No step offs, no erythema. Pt has 2+ patellar reflex bilaterally. Able to discriminate between sharp and dull. Able to ambulate   Neurological: He is alert and oriented to person, place, and time.  Skin: Skin is warm.  Nursing note and vitals reviewed.   ED Course  Procedures (including critical care time) Labs Review Labs Reviewed - No data to display  Imaging Review Dg Lumbar Spine Complete  04/02/2016  CLINICAL DATA:  Larey SeatFell from a ladder 1 week ago. Low back and left hip pain. EXAM: LUMBAR SPINE - COMPLETE 4+ VIEW COMPARISON:   None. FINDINGS: Normal alignment of the lumbar spine. Degenerative changes with narrowed interspaces and endplate hypertrophic changes throughout. Degenerative disc disease at the lumbosacral interspace. No vertebral compression deformities. No focal bone lesion or bone destruction. Visualized sacrum appears intact. Vascular calcifications. IMPRESSION: Degenerative changes in the lumbar spine. Normal alignment. No acute displaced fractures are identified. Electronically Signed   By: Burman NievesWilliam  Stevens M.D.   On: 04/02/2016 05:36   Dg Hip Unilat With Pelvis 2-3 Views Left  04/02/2016  CLINICAL DATA:  Larey SeatFell from a ladder 1 week ago. Low back pain and left hip pain. EXAM: DG HIP (WITH OR WITHOUT PELVIS) 2-3V LEFT COMPARISON:  None. FINDINGS: Pelvis appears intact. Left hip appears intact. No evidence of acute fracture or dislocation. No focal bone lesions or bone destruction. SI joints and symphysis pubis are not displaced. Mild degenerative changes in the lower lumbar spine. IMPRESSION: No acute bony abnormalities. Electronically Signed   By: Burman NievesWilliam  Stevens M.D.   On: 04/02/2016 05:35   I have personally reviewed and evaluated these images and lab results as part of my medical decision-making.   EKG Interpretation None      MDM   Final diagnoses:  Hip pain, acute, left  Uncomplicated alcohol dependence (HCC)    Pt comes in with cc of detox. He admits to heavy drinking. He wants detox, but he was informed that we dont have a detox in the hospital - and so patients who are not acutely withdrawing are sent home with meds and outpatient f/u. Pt not happy, since he came 20 miles to get admitted. Outpatient resources provided.  Patient is clinically sober. He is talking coherently, gait is stable, and is demonstrating rational thought process. We shall discharge him shortly, and we have discussed the warning signs of alcohol withdrawal with him verbally, and the information will be provided with the  discharge instructions as well.      Derwood KaplanAnkit Sofiya Ezelle, MD 04/02/16 [phone]

## 2016-05-03 DIAGNOSIS — F17210 Nicotine dependence, cigarettes, uncomplicated: Secondary | ICD-10-CM | POA: Insufficient documentation

## 2016-05-03 DIAGNOSIS — F329 Major depressive disorder, single episode, unspecified: Secondary | ICD-10-CM | POA: Insufficient documentation

## 2016-05-03 DIAGNOSIS — J449 Chronic obstructive pulmonary disease, unspecified: Secondary | ICD-10-CM | POA: Insufficient documentation

## 2016-05-03 DIAGNOSIS — F1490 Cocaine use, unspecified, uncomplicated: Secondary | ICD-10-CM | POA: Insufficient documentation

## 2016-05-03 DIAGNOSIS — F1290 Cannabis use, unspecified, uncomplicated: Secondary | ICD-10-CM | POA: Insufficient documentation

## 2016-05-03 DIAGNOSIS — Z79899 Other long term (current) drug therapy: Secondary | ICD-10-CM | POA: Insufficient documentation

## 2016-05-03 DIAGNOSIS — F1020 Alcohol dependence, uncomplicated: Secondary | ICD-10-CM | POA: Insufficient documentation

## 2016-05-04 ENCOUNTER — Emergency Department (HOSPITAL_COMMUNITY): Payer: Self-pay

## 2016-05-04 ENCOUNTER — Encounter (HOSPITAL_COMMUNITY): Payer: Self-pay | Admitting: Emergency Medicine

## 2016-05-04 ENCOUNTER — Emergency Department (HOSPITAL_COMMUNITY)
Admission: EM | Admit: 2016-05-04 | Discharge: 2016-05-04 | Disposition: A | Discharge status: Home / Self Care | Payer: Self-pay | Attending: Emergency Medicine | Admitting: Emergency Medicine

## 2016-05-04 ENCOUNTER — Ambulatory Visit (HOSPITAL_COMMUNITY): Admission: RE | Admit: 2016-05-04 | Payer: Self-pay | Source: Home / Self Care | Admitting: Psychiatry

## 2016-05-04 DIAGNOSIS — J449 Chronic obstructive pulmonary disease, unspecified: Secondary | ICD-10-CM

## 2016-05-04 DIAGNOSIS — F1020 Alcohol dependence, uncomplicated: Secondary | ICD-10-CM

## 2016-05-04 DIAGNOSIS — F329 Major depressive disorder, single episode, unspecified: Secondary | ICD-10-CM

## 2016-05-04 DIAGNOSIS — F32A Depression, unspecified: Secondary | ICD-10-CM

## 2016-05-04 LAB — ACETAMINOPHEN LEVEL: Acetaminophen (Tylenol), Serum: <10 ug/mL — ABNORMAL LOW (ref 10–30)

## 2016-05-04 LAB — COMPREHENSIVE METABOLIC PANEL
ALT: 27 U/L (ref 17–63)
AST: 28 U/L (ref 15–41)
Albumin: 4.7 g/dL (ref 3.5–5.0)
Alkaline Phosphatase: 70 U/L (ref 38–126)
Anion gap: 8 (ref 5–15)
BUN: 9 mg/dL (ref 6–20)
CO2: 24 mmol/L (ref 22–32)
Calcium: 9.7 mg/dL (ref 8.9–10.3)
Chloride: 108 mmol/L (ref 101–111)
Creatinine, Ser: 0.98 mg/dL (ref 0.61–1.24)
GFR calc Af Amer: >60 mL/min (ref >60)
GFR calc non Af Amer: >60 mL/min (ref >60)
Glucose, Bld: 102 mg/dL — ABNORMAL HIGH (ref 65–99)
Potassium: 3.9 mmol/L (ref 3.5–5.1)
Sodium: 140 mmol/L (ref 135–145)
Total Bilirubin: 0.5 mg/dL (ref 0.3–1.2)
Total Protein: 7.5 g/dL (ref 6.5–8.1)

## 2016-05-04 LAB — CBC
HCT: 45.8 % (ref 39.0–52.0)
Hemoglobin: 15.7 g/dL (ref 13.0–17.0)
MCH: 31.1 pg (ref 26.0–34.0)
MCHC: 34.3 g/dL (ref 30.0–36.0)
MCV: 90.7 fL (ref 78.0–100.0)
Platelets: 224 10*3/uL (ref 150–400)
RBC: 5.05 MIL/uL (ref 4.22–5.81)
RDW: 13.8 % (ref 11.5–15.5)
WBC: 7.2 10*3/uL (ref 4.0–10.5)

## 2016-05-04 LAB — ETHANOL: Alcohol, Ethyl (B): 173 mg/dL — ABNORMAL HIGH (ref <5)

## 2016-05-04 LAB — SALICYLATE LEVEL: Salicylate Lvl: <4 mg/dL (ref 2.8–30.0)

## 2016-05-04 MED ORDER — ONDANSETRON 4 MG PO TBDP: 4.0000 mg | ORAL_TABLET | Freq: Three times a day (TID) | ORAL | Status: DC | PRN

## 2016-05-04 MED ORDER — CHLORDIAZEPOXIDE HCL 25 MG PO CAPS: ORAL_CAPSULE | ORAL | Status: DC

## 2016-05-04 MED ORDER — HYDROXYZINE HCL 25 MG PO TABS: 25.0000 mg | ORAL_TABLET | Freq: Four times a day (QID) | ORAL | Status: DC | PRN

## 2016-05-04 MED ORDER — LORAZEPAM 1 MG PO TABS: 0.0000 mg | ORAL_TABLET | Freq: Two times a day (BID) | ORAL | Status: DC

## 2016-05-04 MED ORDER — LORAZEPAM 1 MG PO TABS: 0.0000 mg | ORAL_TABLET | Freq: Four times a day (QID) | ORAL | Status: DC

## 2016-05-04 MED ORDER — SODIUM CHLORIDE 0.9 % IV BOLUS (SEPSIS)
1000.0000 mL | Freq: Once | INTRAVENOUS | Status: AC
  Administered 2016-05-04: 1000 mL via INTRAVENOUS

## 2016-05-04 MED ORDER — ALBUTEROL SULFATE HFA 108 (90 BASE) MCG/ACT IN AERS
2.0000 | INHALATION_SPRAY | RESPIRATORY_TRACT | Status: DC | PRN
  Administered 2016-05-04: 2 via RESPIRATORY_TRACT
  Filled 2016-05-04: qty 6.7

## 2016-05-04 MED ORDER — IPRATROPIUM-ALBUTEROL 0.5-2.5 (3) MG/3ML IN SOLN
3.0000 mL | Freq: Once | RESPIRATORY_TRACT | Status: AC
  Administered 2016-05-04: 3 mL via RESPIRATORY_TRACT
  Filled 2016-05-04: qty 3

## 2016-05-04 NOTE — ED Notes (Signed)
Patient still in the waiting room bathroom.
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
## 2016-05-04 NOTE — ED Notes (Signed)
Pt took to shower back in TCU to get cleaned up.
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
## 2016-05-04 NOTE — ED Notes (Signed)
Pt states that he wants detox from alcohol and wants to get his mental health 'back on track' in order to be able to live by himself and function on his own. States he has passive SI some days but not currently. Alert and oriented.

## 2016-05-04 NOTE — Discharge Instructions (Signed)
You have been given an inhaler to use for your COPD 2 puffs every 4-6 hours as needed for shortness of breath You've been given a prescription for Librium to use to help control withdrawal symptoms.  Please use as directed You've been given referrals to intensive outpatient therapy to help with your recovery Return anytime for further evaluation as needed for shortness of breath withdrawal symptoms, increased depression or anxiety

## 2016-05-21 ENCOUNTER — Encounter (HOSPITAL_COMMUNITY): Payer: Self-pay | Admitting: *Deleted

## 2016-05-21 ENCOUNTER — Emergency Department (HOSPITAL_COMMUNITY)
Admission: EM | Admit: 2016-05-21 | Discharge: 2016-05-21 | Disposition: A | Discharge status: Other Acute Inpatient Hospital | Payer: Self-pay | Attending: Emergency Medicine | Admitting: Emergency Medicine

## 2016-05-21 ENCOUNTER — Encounter (HOSPITAL_COMMUNITY): Payer: Self-pay

## 2016-05-21 ENCOUNTER — Inpatient Hospital Stay (HOSPITAL_COMMUNITY)
Admission: AD | Admit: 2016-05-21 | Discharge: 2016-05-27 | DRG: 885 | Disposition: A | Discharge status: Home / Self Care | Payer: Federal, State, Local not specified - Other | Source: Intra-hospital | Attending: Psychiatry | Admitting: Psychiatry

## 2016-05-21 DIAGNOSIS — R45851 Suicidal ideations: Secondary | ICD-10-CM | POA: Diagnosis present

## 2016-05-21 DIAGNOSIS — F329 Major depressive disorder, single episode, unspecified: Secondary | ICD-10-CM | POA: Diagnosis present

## 2016-05-21 DIAGNOSIS — F1020 Alcohol dependence, uncomplicated: Secondary | ICD-10-CM | POA: Diagnosis present

## 2016-05-21 DIAGNOSIS — F17210 Nicotine dependence, cigarettes, uncomplicated: Secondary | ICD-10-CM | POA: Insufficient documentation

## 2016-05-21 DIAGNOSIS — F411 Generalized anxiety disorder: Secondary | ICD-10-CM | POA: Diagnosis not present

## 2016-05-21 DIAGNOSIS — J449 Chronic obstructive pulmonary disease, unspecified: Secondary | ICD-10-CM | POA: Insufficient documentation

## 2016-05-21 DIAGNOSIS — F332 Major depressive disorder, recurrent severe without psychotic features: Principal | ICD-10-CM | POA: Diagnosis present

## 2016-05-21 DIAGNOSIS — Z79899 Other long term (current) drug therapy: Secondary | ICD-10-CM | POA: Insufficient documentation

## 2016-05-21 DIAGNOSIS — F10220 Alcohol dependence with intoxication, uncomplicated: Secondary | ICD-10-CM | POA: Diagnosis present

## 2016-05-21 DIAGNOSIS — F1210 Cannabis abuse, uncomplicated: Secondary | ICD-10-CM | POA: Insufficient documentation

## 2016-05-21 DIAGNOSIS — F1410 Cocaine abuse, uncomplicated: Secondary | ICD-10-CM | POA: Insufficient documentation

## 2016-05-21 DIAGNOSIS — Y906 Blood alcohol level of 120-199 mg/100 ml: Secondary | ICD-10-CM | POA: Diagnosis present

## 2016-05-21 DIAGNOSIS — F32A Depression, unspecified: Secondary | ICD-10-CM

## 2016-05-21 DIAGNOSIS — F331 Major depressive disorder, recurrent, moderate: Secondary | ICD-10-CM | POA: Insufficient documentation

## 2016-05-21 DIAGNOSIS — B1920 Unspecified viral hepatitis C without hepatic coma: Secondary | ICD-10-CM

## 2016-05-21 LAB — RAPID URINE DRUG SCREEN, HOSP PERFORMED
AMPHETAMINES: NOT DETECTED
BARBITURATES: NOT DETECTED
BENZODIAZEPINES: POSITIVE — AB
Cocaine: NOT DETECTED
Opiates: NOT DETECTED
TETRAHYDROCANNABINOL: NOT DETECTED

## 2016-05-21 LAB — COMPREHENSIVE METABOLIC PANEL
ALK PHOS: 68 U/L (ref 38–126)
ALT: 21 U/L (ref 17–63)
ANION GAP: 9 (ref 5–15)
AST: 26 U/L (ref 15–41)
Albumin: 4.5 g/dL (ref 3.5–5.0)
BUN: 12 mg/dL (ref 6–20)
CALCIUM: 9.7 mg/dL (ref 8.9–10.3)
CHLORIDE: 107 mmol/L (ref 101–111)
CO2: 23 mmol/L (ref 22–32)
Creatinine, Ser: 1.17 mg/dL (ref 0.61–1.24)
GFR calc Af Amer: >60 mL/min (ref >60)
GFR calc non Af Amer: >60 mL/min (ref >60)
GLUCOSE: 92 mg/dL (ref 65–99)
Potassium: 3.7 mmol/L (ref 3.5–5.1)
Sodium: 139 mmol/L (ref 135–145)
Total Bilirubin: 0.7 mg/dL (ref 0.3–1.2)
Total Protein: 7.4 g/dL (ref 6.5–8.1)

## 2016-05-21 LAB — ETHANOL: Alcohol, Ethyl (B): 141 mg/dL — ABNORMAL HIGH (ref <5)

## 2016-05-21 LAB — CBC
HEMATOCRIT: 43.8 % (ref 39.0–52.0)
HEMOGLOBIN: 15.2 g/dL (ref 13.0–17.0)
MCH: 31.3 pg (ref 26.0–34.0)
MCHC: 34.7 g/dL (ref 30.0–36.0)
MCV: 90.1 fL (ref 78.0–100.0)
Platelets: 219 10*3/uL (ref 150–400)
RBC: 4.86 MIL/uL (ref 4.22–5.81)
RDW: 13.7 % (ref 11.5–15.5)
WBC: 7.3 10*3/uL (ref 4.0–10.5)

## 2016-05-21 LAB — ACETAMINOPHEN LEVEL

## 2016-05-21 LAB — SALICYLATE LEVEL

## 2016-05-21 MED ORDER — NICOTINE 21 MG/24HR TD PT24
21.0000 mg | MEDICATED_PATCH | Freq: Every day | TRANSDERMAL | Status: DC
  Administered 2016-05-22 – 2016-05-27 (×6): 21 mg via TRANSDERMAL
  Filled 2016-05-21 (×8): qty 1

## 2016-05-21 MED ORDER — FLUOXETINE HCL 20 MG PO CAPS
60.0000 mg | ORAL_CAPSULE | Freq: Every day | ORAL | Status: DC
  Administered 2016-05-22 – 2016-05-23 (×2): 60 mg via ORAL
  Filled 2016-05-21 (×4): qty 3

## 2016-05-21 MED ORDER — CHLORDIAZEPOXIDE HCL 25 MG PO CAPS
25.0000 mg | ORAL_CAPSULE | ORAL | Status: AC
  Administered 2016-05-23 – 2016-05-24 (×2): 25 mg via ORAL
  Filled 2016-05-21 (×2): qty 1

## 2016-05-21 MED ORDER — HYDROXYZINE HCL 25 MG PO TABS
25.0000 mg | ORAL_TABLET | Freq: Four times a day (QID) | ORAL | Status: AC | PRN
  Administered 2016-05-22 – 2016-05-23 (×2): 25 mg via ORAL
  Filled 2016-05-21 (×2): qty 1

## 2016-05-21 MED ORDER — LORAZEPAM 1 MG PO TABS: 0.0000 mg | ORAL_TABLET | Freq: Two times a day (BID) | ORAL | Status: DC

## 2016-05-21 MED ORDER — FLUOXETINE HCL 20 MG PO CAPS
60.0000 mg | ORAL_CAPSULE | Freq: Every day | ORAL | Status: DC
  Administered 2016-05-21: 60 mg via ORAL
  Filled 2016-05-21: qty 3

## 2016-05-21 MED ORDER — ADULT MULTIVITAMIN W/MINERALS CH
1.0000 | ORAL_TABLET | Freq: Every day | ORAL | Status: DC
  Administered 2016-05-21 – 2016-05-27 (×7): 1 via ORAL
  Filled 2016-05-21 (×9): qty 1

## 2016-05-21 MED ORDER — PNEUMOCOCCAL VAC POLYVALENT 25 MCG/0.5ML IJ INJ
0.5000 mL | INJECTION | INTRAMUSCULAR | Status: AC
Start: 2016-05-22 — End: 2016-05-22
  Administered 2016-05-22: 0.5 mL via INTRAMUSCULAR

## 2016-05-21 MED ORDER — LORAZEPAM 1 MG PO TABS
0.0000 mg | ORAL_TABLET | Freq: Four times a day (QID) | ORAL | Status: DC
  Administered 2016-05-21: 1 mg via ORAL
  Filled 2016-05-21: qty 1

## 2016-05-21 MED ORDER — CHLORDIAZEPOXIDE HCL 25 MG PO CAPS
25.0000 mg | ORAL_CAPSULE | Freq: Three times a day (TID) | ORAL | Status: AC
  Administered 2016-05-22 – 2016-05-23 (×3): 25 mg via ORAL
  Filled 2016-05-21 (×3): qty 1

## 2016-05-21 MED ORDER — CHLORDIAZEPOXIDE HCL 25 MG PO CAPS
25.0000 mg | ORAL_CAPSULE | Freq: Every day | ORAL | Status: AC
  Administered 2016-05-25: 25 mg via ORAL
  Filled 2016-05-21: qty 1

## 2016-05-21 MED ORDER — LORAZEPAM 1 MG PO TABS: 0.0000 mg | ORAL_TABLET | Freq: Four times a day (QID) | ORAL | Status: DC

## 2016-05-21 MED ORDER — LOPERAMIDE HCL 2 MG PO CAPS: 2.0000 mg | ORAL_CAPSULE | ORAL | Status: AC | PRN

## 2016-05-21 MED ORDER — NICOTINE 21 MG/24HR TD PT24
21.0000 mg | MEDICATED_PATCH | Freq: Every day | TRANSDERMAL | Status: DC
  Administered 2016-05-21: 21 mg via TRANSDERMAL
  Filled 2016-05-21: qty 1

## 2016-05-21 MED ORDER — TRAZODONE HCL 50 MG PO TABS
50.0000 mg | ORAL_TABLET | Freq: Every evening | ORAL | Status: DC | PRN
  Administered 2016-05-22 – 2016-05-26 (×5): 50 mg via ORAL
  Filled 2016-05-21: qty 1
  Filled 2016-05-21: qty 7
  Filled 2016-05-21 (×4): qty 1

## 2016-05-21 MED ORDER — ACETAMINOPHEN 325 MG PO TABS: 650.0000 mg | ORAL_TABLET | ORAL | Status: DC | PRN

## 2016-05-21 MED ORDER — CHLORDIAZEPOXIDE HCL 25 MG PO CAPS
25.0000 mg | ORAL_CAPSULE | Freq: Four times a day (QID) | ORAL | Status: AC
  Administered 2016-05-21 – 2016-05-22 (×4): 25 mg via ORAL
  Filled 2016-05-21 (×4): qty 1

## 2016-05-21 MED ORDER — ALUM & MAG HYDROXIDE-SIMETH 200-200-20 MG/5ML PO SUSP: 30.0000 mL | ORAL | Status: DC | PRN

## 2016-05-21 MED ORDER — MAGNESIUM HYDROXIDE 400 MG/5ML PO SUSP: 30.0000 mL | Freq: Every day | ORAL | Status: DC | PRN

## 2016-05-21 MED ORDER — ONDANSETRON 4 MG PO TBDP
4.0000 mg | ORAL_TABLET | Freq: Four times a day (QID) | ORAL | Status: AC | PRN
Start: 2016-05-21 — End: 2016-05-24

## 2016-05-21 MED ORDER — VITAMIN B-1 100 MG PO TABS
100.0000 mg | ORAL_TABLET | Freq: Every day | ORAL | Status: DC
  Administered 2016-05-23 – 2016-05-27 (×5): 100 mg via ORAL
  Filled 2016-05-21 (×8): qty 1

## 2016-05-21 MED ORDER — VITAMIN B-1 100 MG PO TABS
100.0000 mg | ORAL_TABLET | Freq: Every day | ORAL | Status: DC
  Administered 2016-05-22: 100 mg via ORAL
  Filled 2016-05-21 (×4): qty 1

## 2016-05-21 MED ORDER — VITAMIN B-1 100 MG PO TABS
100.0000 mg | ORAL_TABLET | Freq: Every day | ORAL | Status: DC
  Administered 2016-05-21: 100 mg via ORAL
  Filled 2016-05-21: qty 1

## 2016-05-21 MED ORDER — ONDANSETRON HCL 4 MG PO TABS: 4.0000 mg | ORAL_TABLET | Freq: Three times a day (TID) | ORAL | Status: DC | PRN

## 2016-05-21 MED ORDER — THIAMINE HCL 100 MG/ML IJ SOLN: 100.0000 mg | Freq: Every day | INTRAMUSCULAR | Status: DC

## 2016-05-21 MED ORDER — GABAPENTIN 100 MG PO CAPS
200.0000 mg | ORAL_CAPSULE | Freq: Three times a day (TID) | ORAL | Status: DC | PRN
  Administered 2016-05-23: 200 mg via ORAL
  Filled 2016-05-21: qty 2

## 2016-05-21 MED ORDER — CHLORDIAZEPOXIDE HCL 25 MG PO CAPS: 25.0000 mg | ORAL_CAPSULE | Freq: Four times a day (QID) | ORAL | Status: AC | PRN

## 2016-05-21 MED ORDER — ACETAMINOPHEN 325 MG PO TABS
650.0000 mg | ORAL_TABLET | Freq: Four times a day (QID) | ORAL | Status: DC | PRN
  Administered 2016-05-24 – 2016-05-26 (×2): 650 mg via ORAL
  Filled 2016-05-21 (×2): qty 2

## 2016-05-21 MED ORDER — IBUPROFEN 200 MG PO TABS: 600.0000 mg | ORAL_TABLET | Freq: Three times a day (TID) | ORAL | Status: DC | PRN

## 2016-05-21 MED ORDER — GABAPENTIN 100 MG PO CAPS: 200.0000 mg | ORAL_CAPSULE | Freq: Three times a day (TID) | ORAL | Status: DC | PRN

## 2016-05-21 NOTE — BH Assessment (Signed)
Assessment completed. Consulted with Donell Sievert, PA-C who recommends inpatient treatment at this time.   Davina Poke, LCSW Therapeutic Triage Specialist [facility] 05/21/2016 3:38 AM

## 2016-05-21 NOTE — Tx Team (Signed)
Initial Interdisciplinary Treatment Plan   PATIENT STRESSORS: Substance abuse   PATIENT STRENGTHS: Ability for insight Average or above average intelligence Capable of independent living Licensed conveyancer Motivation for treatment/growth Physical Health Supportive family/friends Work skills   PROBLEM LIST: Problem List/Patient Goals Date to be addressed Date deferred Reason deferred Estimated date of resolution  "I'm trying hard to quit drinking, but it's been worse" Substance Abuse 05/21/16     Alteration in mood (Anxiety and Depression) 05/21/16     Ineffective Coping Skills 05/21/16                                          DISCHARGE CRITERIA:  Improved stabilization in mood, thinking, and/or behavior Motivation to continue treatment in a less acute level of care Reduction of life-threatening or endangering symptoms to within safe limits Verbal commitment to aftercare and medication compliance Withdrawal symptoms are absent or subacute and managed without 24-hour nursing intervention  PRELIMINARY DISCHARGE PLAN: Outpatient therapy Return to previous living arrangement  PATIENT/FAMIILY INVOLVEMENT: This treatment plan has been presented to and reviewed with the patient, Tim [name]. The patient have been given the opportunity to ask questions and make suggestions.  Sherryl Manges 05/21/2016, 1:46 PM
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
## 2016-05-21 NOTE — ED Notes (Signed)
Bed: KTG2 Expected date:  Expected time:  Means of arrival:  Comments: EMS 60yo M SI / ETOH

## 2016-05-21 NOTE — ED Notes (Signed)
SBAR Report received from previous nurse. Pt received calm and visible on unit. Pt denies current  HI, A/V H, and pain at this time, and is otherwise stable. Pt reminded of camera surveillance, q 15 min rounds, and rules of the milieu. Will continue to assess.

## 2016-05-21 NOTE — ED Notes (Signed)
Patient was changed out belongings in patient bag and wanded by security.  Patient being escorted to room 42.
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
## 2016-05-21 NOTE — BH Assessment (Signed)
Patient has been accepted to [facility] after 8AM per [facility], RN, [facility]. Patient will sign voluntary consent to treat and ROI. Patient to be transported via [facility] due to voluntary status. Call nurse report to [phone]  Davina Poke, LCSW Therapeutic Triage Specialist [facility] 05/21/2016 3:53 AM

## 2016-05-21 NOTE — ED Notes (Signed)
[facility] transport on unit to transfer pt to [facility] Adult unit per MD order. Pt signed for personal belongings and belongings given to [facility] transport for transfer. Pt signed e-signature. Ambulatory off unit.

## 2016-05-21 NOTE — H&P (Signed)
Psychiatric Admission Assessment Adult  Patient Identification: Tim [name] MRN:  [id number] Date of Evaluation:  05/22/2016  Chief Complaint: "I was depressed, drunk & feeling suicidal".   Principal Diagnosis: Alcohol use disorder, chronic  Diagnosis:   Patient Active Problem List   Diagnosis Date Noted  . MDD (major depressive disorder) ([place]) [F32.9] 05/21/2016  . Alcohol use disorder, severe, dependence ([place]) [F10.20] 11/05/2015  . Depression, major, recurrent, moderate ([place]) [F33.1] 11/05/2015  . GAD (generalized anxiety disorder) [F41.1] 11/05/2015  . Alcohol dependence with uncomplicated withdrawal ([place]) [F10.230] 07/15/2015  . Cellulitis of right lower extremity [L03.115] 03/29/2015  . Alcohol dependence ([place]) [F10.20] 03/05/2012   History of Present Illness: Tim [name] was admitted to the [facility] adult unit from the [facility] with a BAL of 141 & UDS positive for Benzodiazepine. He presented with complaint of suicidal ideations with a plan that lasted 2 weeks. During this assessment, Tim [name] reports, "Some christian people saw me distressed & took me to the hospital the other day. 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 I got frustrated & depressed because I felt like I'm no good no more. I could no longer do what I used to do well. I drank few cans of beer, went & stood by the rail way tract watching the train go by. I was thinking whether or to job in front of the train. Some people saw me. They asked if I was alright, I told them that I was feeling suicidal. They took me to the hospital. I guess I stayed drunk & snapped".  Associated Signs/Symptoms: Depression Symptoms:  depressed mood, insomnia, fatigue, anxiety, panic attacks, loss of energy/fatigue,  (Hypo) Manic Symptoms:  Irritable Mood, Labiality of Mood,  Anxiety Symptoms:  Excessive Worry, Panic Symptoms,  Psychotic Symptoms:   denies  PTSD Symptoms: Denies  Total Time spent with patient: 1 hour  Past Psychiatric History: Alcoholism, chronic.   Risk to Self: Is patient at risk for suicide?: No  Risk to Others:  No  Prior Inpatient Therapy:  [facility] August last time [facility]  Prior Outpatient Therapy:  [facility]   Alcohol Screening: Patient refused Alcohol Screening Tool: Yes 1. How often do you have a drink containing alcohol?: 2 to 3 times a week 2. How many drinks containing alcohol do you have on a typical day when you are drinking?: 5 or 6 ("sometimes I drink a bottle of Wild rose--the Red one" 5th of etoh) 3. How often do you have six or more drinks on one occasion?: Daily or almost daily Preliminary Score: 6 4. How often during the last year have you found that you were not able to stop drinking once you had started?: Daily or almost daily 5. How often during the last year have you failed to do what was normally expected from you becasue of drinking?: Monthly 6. How often during the last year have you needed a first drink in the morning to get yourself going after a heavy drinking session?: Weekly 7. How often during the last year have you had a feeling of guilt of remorse after drinking?: Weekly 8. How often during the last year have you been unable to remember what happened the night before because you had been drinking?: Weekly 9. Have you or someone else been injured as a result of your drinking?: No 10. Has a relative or friend or a doctor or another health  worker been concerned about your drinking or suggested you cut down?: Yes, during the last year Alcohol Use Disorder Identification Test Final Score (AUDIT): 28 Brief Intervention: Yes  Substance Abuse History in the last 12 months:  Yes.    Consequences of Substance Abuse: Legal Consequences:  few DWI's up to 5 years ago Withdrawal Symptoms:   Diaphoresis Diarrhea Nausea Tremors  Previous Psychotropic Medications: Yes, Prozac  Neurontin Trazodone  Psychological Evaluations: No   Past Medical History:  Past Medical History:  Diagnosis Date  . Alcoholism ([place])   . Anxiety   . COPD (chronic obstructive pulmonary disease) ([place])   . Depression   . Hep C w/o coma, chronic ([place])   . Mental health disorder   . Scoliosis   . Thyroid disease     Past Surgical History:  Procedure Laterality Date  . SKIN CANCER EXCISION    . THYROID SURGERY     Family History:  Family History  Problem Relation Age of Onset  . Dementia Mother    Family Psychiatric  History: Father alcoholic niece alcohol   Social History:  History  Alcohol Use  . Yes    Comment: drank 1/5 today.     History  Drug Use  . Types: Cocaine, Marijuana    Comment: also states will take pain pills every now and then, last use of cocaine and ETOH today at 1400    Social History   Social History  . Marital status: Single    Spouse name: N/A  . Number of children: N/A  . Years of education: N/A   Social History Main Topics  . Smoking status: Current Every Day Smoker    Packs/day: 0.50    Years: 40.00    Types: Cigarettes  . Smokeless tobacco: Never Used  . Alcohol use Yes     Comment: drank 1/5 today.  . Drug use:     Types: Cocaine, Marijuana     Comment: also states will take pain pills every now and then, last use of cocaine and ETOH today at 1400  . Sexual activity: No   Other Topics Concern  . None   Social History Narrative   ** Merged History Encounter **       Lives with his mother. Got GED.  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 Single no children in no relationship   Allergies:  No Known Allergies  Lab Results:  Results for orders placed or performed during the hospital encounter of 05/21/16 (from the past 48 hour(s))  Rapid urine drug screen (hospital performed)     Status: Abnormal   Collection Time: 05/21/16  1:03 AM  Result Value Ref Range   Opiates NONE DETECTED NONE DETECTED    Cocaine NONE DETECTED NONE DETECTED   Benzodiazepines POSITIVE (A) NONE DETECTED   Amphetamines NONE DETECTED NONE DETECTED   Tetrahydrocannabinol NONE DETECTED NONE DETECTED   Barbiturates NONE DETECTED NONE DETECTED    Comment:        DRUG SCREEN FOR MEDICAL PURPOSES ONLY.  IF CONFIRMATION IS NEEDED FOR ANY PURPOSE, NOTIFY LAB WITHIN 5 DAYS.        LOWEST DETECTABLE LIMITS FOR URINE DRUG SCREEN Drug Class       Cutoff (ng/mL) Amphetamine      1000 Barbiturate      200 Benzodiazepine   998 Tricyclics       338 Opiates          300 Cocaine  300 THC              50   Comprehensive metabolic panel     Status: None   Collection Time: 05/21/16  1:14 AM  Result Value Ref Range   Sodium 139 135 - 145 mmol/L   Potassium 3.7 3.5 - 5.1 mmol/L   Chloride 107 101 - 111 mmol/L   CO2 23 22 - 32 mmol/L   Glucose, Bld 92 65 - 99 mg/dL   BUN 12 6 - 20 mg/dL   Creatinine, Ser 1.17 0.61 - 1.24 mg/dL   Calcium 9.7 8.9 - 10.3 mg/dL   Total Protein 7.4 6.5 - 8.1 g/dL   Albumin 4.5 3.5 - 5.0 g/dL   AST 26 15 - 41 U/L   ALT 21 17 - 63 U/L   Alkaline Phosphatase 68 38 - 126 U/L   Total Bilirubin 0.7 0.3 - 1.2 mg/dL   GFR calc non Af Amer >60 >60 mL/min   GFR calc Af Amer >60 >60 mL/min    Comment: (NOTE) The eGFR has been calculated using the CKD EPI equation. This calculation has not been validated in all clinical situations. eGFR's persistently <60 mL/min signify possible Chronic Kidney Disease.    Anion gap 9 5 - 15  Ethanol     Status: Abnormal   Collection Time: 05/21/16  1:14 AM  Result Value Ref Range   Alcohol, Ethyl (B) 141 (H) <5 mg/dL    Comment:        LOWEST DETECTABLE LIMIT FOR SERUM ALCOHOL IS 5 mg/dL FOR MEDICAL PURPOSES ONLY   cbc     Status: None   Collection Time: 05/21/16  1:14 AM  Result Value Ref Range   WBC 7.3 4.0 - 10.5 K/uL   RBC 4.86 4.22 - 5.81 MIL/uL   Hemoglobin 15.2 13.0 - 17.0 g/dL   HCT 43.8 39.0 - 52.0 %   MCV 90.1 78.0 - 100.0 fL    MCH 31.3 26.0 - 34.0 pg   MCHC 34.7 30.0 - 36.0 g/dL   RDW 13.7 11.5 - 15.5 %   Platelets 219 856 - 314 K/uL  Salicylate level     Status: None   Collection Time: 05/21/16  1:14 AM  Result Value Ref Range   Salicylate Lvl <9.7 2.8 - 30.0 mg/dL  Acetaminophen level     Status: Abnormal   Collection Time: 05/21/16  1:14 AM  Result Value Ref Range   Acetaminophen (Tylenol), Serum <10 (L) 10 - 30 ug/mL    Comment:        THERAPEUTIC CONCENTRATIONS VARY SIGNIFICANTLY. A RANGE OF 10-30 ug/mL MAY BE AN EFFECTIVE CONCENTRATION FOR MANY PATIENTS. HOWEVER, SOME ARE BEST TREATED AT CONCENTRATIONS OUTSIDE THIS RANGE. ACETAMINOPHEN CONCENTRATIONS >150 ug/mL AT 4 HOURS AFTER INGESTION AND >50 ug/mL AT 12 HOURS AFTER INGESTION ARE OFTEN ASSOCIATED WITH TOXIC REACTIONS.    Metabolic Disorder Labs:  No results found for: HGBA1C, MPG No results found for: PROLACTIN No results found for: CHOL, TRIG, HDL, CHOLHDL, VLDL, LDLCALC  Current Medications: Current Facility-Administered Medications  Medication Dose Route Frequency Provider Last Rate Last Dose  . acetaminophen (TYLENOL) tablet 650 mg  650 mg Oral Q6H PRN Encarnacion Slates, NP      . chlordiazePOXIDE (LIBRIUM) capsule 25 mg  25 mg Oral Q6H PRN Encarnacion Slates, NP      . chlordiazePOXIDE (LIBRIUM) capsule 25 mg  25 mg Oral TID Encarnacion Slates, NP  Followed by  . [START ON 05/23/2016] chlordiazePOXIDE (LIBRIUM) capsule 25 mg  25 mg Oral BH-qamhs Encarnacion Slates, NP       Followed by  . [START ON 05/25/2016] chlordiazePOXIDE (LIBRIUM) capsule 25 mg  25 mg Oral Daily Encarnacion Slates, NP      . FLUoxetine (PROZAC) capsule 60 mg  60 mg Oral Daily Shuvon B Rankin, NP   60 mg at 05/22/16 0751  . gabapentin (NEURONTIN) capsule 200 mg  200 mg Oral TID PRN Shuvon B Rankin, NP      . hydrOXYzine (ATARAX/VISTARIL) tablet 25 mg  25 mg Oral Q6H PRN Encarnacion Slates, NP   25 mg at 05/22/16 1159  . loperamide (IMODIUM) capsule 2-4 mg  2-4 mg Oral PRN Encarnacion Slates, NP      . magnesium hydroxide (MILK OF MAGNESIA) suspension 30 mL  30 mL Oral Daily PRN Shuvon B Rankin, NP      . multivitamin with minerals tablet 1 tablet  1 tablet Oral Daily Encarnacion Slates, NP   1 tablet at 05/22/16 0750  . nicotine (NICODERM CQ - dosed in mg/24 hours) patch 21 mg  21 mg Transdermal Daily Shuvon B Rankin, NP   21 mg at 05/22/16 0751  . ondansetron (ZOFRAN-ODT) disintegrating tablet 4 mg  4 mg Oral Q6H PRN Encarnacion Slates, NP      . thiamine (VITAMIN B-1) tablet 100 mg  100 mg Oral Daily Shuvon B Rankin, NP      . traZODone (DESYREL) tablet 50 mg  50 mg Oral QHS PRN Lurena Nida, NP       PTA Medications: Prescriptions Prior to Admission  Medication Sig Dispense Refill Last Dose  . acamprosate (CAMPRAL) 333 MG tablet Take 2 tablets (666 mg total) by mouth 3 (three) times daily with meals. For alcoholism (Patient not taking: Reported on 12/23/2015) 180 tablet 0 Not Taking at Unknown time  . chlordiazePOXIDE (LIBRIUM) 25 MG capsule 47m PO TID x 1D, then 25-556mPO BID X 1D, then 25-5090mO QD X 1D (Patient not taking: Reported on 05/21/2016) 10 capsule 0 Not Taking at Unknown time  . FLUoxetine (PROZAC) 20 MG capsule Take 1 capsule (20 mg total) by mouth daily. For depression (Patient taking differently: Take 60 mg by mouth daily. For depression) 30 capsule 0 05/20/2016 at Unknown time  . gabapentin (NEURONTIN) 100 MG capsule Take 2 capsules (200 mg total) by mouth 3 (three) times daily. For agitation/pain (Patient taking differently: Take 200 mg by mouth 3 (three) times daily as needed. For agitation/pain) 180 capsule 0 05/20/2016 at Unknown time  . hydrOXYzine (ATARAX/VISTARIL) 25 MG tablet Take 1 tablet (25 mg total) by mouth every 6 (six) hours as needed for anxiety or nausea. 60 tablet 0 05/19/2016  . ondansetron (ZOFRAN ODT) 4 MG disintegrating tablet Take 1 tablet (4 mg total) by mouth every 8 (eight) hours as needed for nausea or vomiting. (Patient not taking: Reported on  05/21/2016) 20 tablet 0 Not Taking at Unknown time  . traZODone (DESYREL) 50 MG tablet Take 1 tablet (50 mg total) by mouth at bedtime. For sleep 30 tablet 0 Past Week at Unknown time   Musculoskeletal: Strength & Muscle Tone: within normal limits Gait & Station: normal Patient leans: normal  Psychiatric Specialty Exam: Physical Exam  Constitutional: He is oriented to person, place, and time. He appears well-developed.  HENT:  Head: Normocephalic.  Eyes: Pupils are equal, round, and reactive to  light.  Neck: Normal range of motion.  Cardiovascular: Normal rate.   Respiratory: Effort normal.  GI: Soft.  Genitourinary:  Genitourinary Comments: Denies any issues in this area  Musculoskeletal: Normal range of motion.  Neurological: He is alert and oriented to person, place, and time.  Skin: Skin is warm and dry.    Review of Systems  Constitutional: Positive for malaise/fatigue.  HENT: Negative.   Eyes: Positive for blurred vision.  Respiratory: Negative.        10 a day has COPD  Cardiovascular: Negative.   Gastrointestinal: Positive for heartburn.  Genitourinary: Negative.   Musculoskeletal: Positive for joint pain.  Skin: Negative.   Neurological: Positive for weakness.  Endo/Heme/Allergies: Negative.   Psychiatric/Behavioral: Positive for depression and substance abuse. The patient is nervous/anxious and has insomnia.     Blood pressure 122/83, pulse 68, temperature 98 F (36.7 C), temperature source Oral, resp. rate 16, height 6' 2"  (1.88 m), weight 82.1 kg (181 lb), SpO2 99 %.Body mass index is 23.24 kg/m.  General Appearance: Fairly Groomed  Engineer, water::  Fair  Speech:  Clear and Coherent  Volume:  fluctuates  Mood:  Anxious and Depressed  Affect:  Restricted  Thought Process:  Coherent and Goal Directed  Orientation:  Full (Time, Place, and Person)  Thought Content:  symptoms events worries concerns  Suicidal Thoughts:  No  Homicidal Thoughts:  No  Memory:   Immediate;   Fair Recent;   Fair Remote;   Fair  Judgement:  Fair  Insight:  Present and Shallow  Psychomotor Activity:  Restlessness  Concentration:  Fair  Recall:  [employer] of Knowledge:Fair  Language: Fair  Akathisia:  No  Handed:  Right  AIMS (if indicated):     Assets:  Desire for Improvement Housing Social Support  ADL's:  Intact  Cognition: WNL  Sleep:  Number of Hours: 6.25   Treatment Plan Summary: Daily contact with patient to assess and evaluate symptoms and progress in treatment and Medication management Supportive approach/coping skills Alcohol dependence; Librium detox protocol/work a relapse prevention plan Depression; Resume the Prozac 20 mg and optimize response Anxiety/agitation: Continue the Neurontin 200 mg TID and optimize response Work with CBT/mindfulness Explore residential treatment options  Observation Level/Precautions:  15 minute checks  Laboratory:  As per the ED  Psychotherapy:  Individual/group  Medications:  Librium detox protocol  Consultations: As needed  Discharge Concerns:  Need for residential treatment  Estimated LOS: 3-5 days  Other: Admit to [facility].    I certify that inpatient services furnished can reasonably be expected to improve the patient's condition.    [place], [place], FNP-BC 8/6/20171:12 PM I have examined the patient and agreed with the findings of H&P and treatment plan. I have also done suicide assessment on this patient.

## 2016-05-21 NOTE — ED Provider Notes (Signed)
[facility]-EMERGENCY DEPT Provider Note   CSN: [id number] Arrival date & time: 05/21/16  6213  By signing my name below, I, Alyssa Grove, attest that this documentation has been prepared under the direction and in the presence of Gilda Crease, MD. Electronically Signed: Alyssa Grove, ED Scribe. 05/21/16 . 1:28 AM.   First MD Initiated Contact with Patient 05/21/16 0117    History   Chief Complaint Chief Complaint  Patient presents with  . Suicidal   The history is provided by the patient and the EMS personnel. No language interpreter was used.    HPI Comments: Tim [name] is a 60 y.o. male with PMHx of Anxiety, Depression, and Alcoholism who presents to the Emergency Department by EMS complaining of suicidal ideations onset earlier today. Pt states he has been feeling like this for the last couple of weeks. Pt states he was standing by the train tracks when he was approached by a bystander who asked if he was okay. Pt told the bystander that he was not and they called EMS. Pt states he was thinking about jumping in front of the train. Pt is currently taking Prozac. Pt has not been seen in the hospital recently for depression. Pt states he had a few beers this week, but not a lot. He states he has had trouble sleeping.   Past Medical History:  Diagnosis Date  . Alcoholism ([facility])   . Anxiety   . COPD (chronic obstructive pulmonary disease) ([facility])   . Depression   . Hep C w/o coma, chronic ([facility])   . Mental health disorder   . Scoliosis   . Thyroid disease     Patient Active Problem List   Diagnosis Date Noted  . Alcohol use disorder, severe, dependence ([facility]) 11/05/2015  . Depression, major, recurrent, moderate ([facility]) 11/05/2015  . GAD (generalized anxiety disorder) 11/05/2015  . Alcohol dependence with uncomplicated withdrawal ([facility]) 07/15/2015  . Cellulitis of right lower extremity 03/29/2015  . Alcohol dependence ([facility]) 03/05/2012    Past Surgical History:  Procedure  Laterality Date  . SKIN CANCER EXCISION    . THYROID SURGERY       Home Medications    Prior to Admission medications   Medication Sig Start Date End Date Taking? Authorizing Provider  acamprosate (CAMPRAL) 333 MG tablet Take 2 tablets (666 mg total) by mouth 3 (three) times daily with meals. For alcoholism Patient not taking: Reported on 12/23/2015 11/11/15   Sanjuana Kava, NP  chlordiazePOXIDE (LIBRIUM) 25 MG capsule  PO TID x 1D, then 25-50mg  PO BID X 1D, then 25-50mg  PO QD X 1D 05/04/16   Earley Favor, NP  FLUoxetine (PROZAC) 20 MG capsule Take 1 capsule (20 mg total) by mouth daily. For depression 11/11/15   Sanjuana Kava, NP  gabapentin (NEURONTIN) 100 MG capsule Take 2 capsules (200 mg total) by mouth 3 (three) times daily. For agitation/pain 11/11/15   Sanjuana Kava, NP  hydrOXYzine (ATARAX/VISTARIL) 25 MG tablet Take 1 tablet (25 mg total) by mouth every 6 (six) hours as needed for anxiety or nausea. 05/04/16   Earley Favor, NP  ondansetron (ZOFRAN ODT) 4 MG disintegrating tablet Take 1 tablet (4 mg total) by mouth every 8 (eight) hours as needed for nausea or vomiting. 05/04/16   Earley Favor, NP  traZODone (DESYREL) 50 MG tablet Take 1 tablet (50 mg total) by mouth at bedtime. For sleep 11/11/15   Sanjuana Kava, NP    Family History Family History  Problem Relation Age of Onset  . Dementia Mother     Social History Social History  Substance Use Topics  . Smoking status: Current Every Day Smoker    Packs/day: 0.50    Years: 40.00    Types: Cigarettes  . Smokeless tobacco: Never Used  . Alcohol use Yes     Comment: drank 1/5 today.     Allergies   Review of patient's allergies indicates no known allergies.   Review of Systems Review of Systems  Constitutional: Negative for fever.  Psychiatric/Behavioral: Positive for sleep disturbance and suicidal ideas.     Physical Exam Updated Vital Signs BP 95/61 (BP Location: Left Arm)   Pulse 74   Temp 98.1 F (36.7 C)  (Oral)   Resp 15   SpO2 95%   Physical Exam  Constitutional: He is oriented to person, place, and time. He appears well-developed and well-nourished. No distress.  HENT:  Head: Normocephalic and atraumatic.  Right Ear: Hearing normal.  Left Ear: Hearing normal.  Nose: Nose normal.  Mouth/Throat: Oropharynx is clear and moist and mucous membranes are normal.  Eyes: Conjunctivae and EOM are normal. Pupils are equal, round, and reactive to light.  Neck: Normal range of motion. Neck supple.  Cardiovascular: Regular rhythm, S1 normal and S2 normal.  Exam reveals no gallop and no friction rub.   No murmur heard. Pulmonary/Chest: Effort normal and breath sounds normal. No respiratory distress. He exhibits no tenderness.  Abdominal: Soft. Normal appearance and bowel sounds are normal. There is no hepatosplenomegaly. There is no tenderness. There is no rebound, no guarding, no tenderness at McBurney's point and negative Murphy's sign. No hernia.  Musculoskeletal: Normal range of motion.  Neurological: He is alert and oriented to person, place, and time. He has normal strength. No cranial nerve deficit or sensory deficit. Coordination normal. GCS eye subscore is 4. GCS verbal subscore is 5. GCS motor subscore is 6.  Skin: Skin is warm, dry and intact. No rash noted. No cyanosis.  Psychiatric: His speech is slurred. He is withdrawn. He exhibits a depressed mood. He expresses suicidal ideation. He expresses suicidal plans.  Nursing note and vitals reviewed.    ED Treatments / Results  DIAGNOSTIC STUDIES: Oxygen Saturation is 95% on RA, adequate by my interpretation.    COORDINATION OF CARE: 1:20 AM Discussed treatment plan with pt at bedside which includes lab work and Consult to TTS and pt agreed to plan.  Labs (all labs ordered are listed, but only abnormal results are displayed) Labs Reviewed  ETHANOL - Abnormal; Notable for the following:       Result Value   Alcohol, Ethyl (B) 141 (*)     All other components within normal limits  URINE RAPID DRUG SCREEN, HOSP PERFORMED - Abnormal; Notable for the following:    Benzodiazepines POSITIVE (*)    All other components within normal limits  ACETAMINOPHEN LEVEL - Abnormal; Notable for the following:    Acetaminophen (Tylenol), Serum <10 (*)    All other components within normal limits  COMPREHENSIVE METABOLIC PANEL  CBC  SALICYLATE LEVEL    EKG  EKG Interpretation None       Radiology No results found.  Procedures Procedures (including critical care time)  Medications Ordered in ED Medications - No data to display   Initial Impression / Assessment and Plan / ED Course  I have reviewed the triage vital signs and the nursing notes.  Pertinent labs & imaging results that were available during  my care of the patient were reviewed by me and considered in my medical decision making (see chart for details).  Clinical Course   Patient presents to the emergency department with complaints of depression. Patient reports that he has been having difficulty with depression over the last couple of weeks, has not been eating, sleeping. Patient reports severe anxiety and is now suicidal. Patient reports that he was found down by a train tracks, the plating stepping out in front of a train that he saw when an individualized. He was all right and he admitted to them that he was feeling badly and was brought to the ER. Patient will require psychiatric evaluation. Patient medically clear for psychiatric evaluation and treatment.  Final Clinical Impressions(s) / ED Diagnoses   Final diagnoses:  None  depression  New Prescriptions New Prescriptions   No medications on file   I personally performed the services described in this documentation, which was scribed in my presence. The recorded information has been reviewed and is accurate.     Gilda Crease, MD 05/21/16 [phone]
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
## 2016-05-21 NOTE — BH Assessment (Signed)
Patient reviewed and singed voluntary consent to treat and ROI to no one. Patient denies questions or concerns. Patients paperwork faxed to [facility] and given to patients nurse who agrees to provide the paperwork with [facility] to transport with patient.   Davina Poke, LCSW Therapeutic Triage Specialist [facility] 05/21/2016 4:05 AM

## 2016-05-21 NOTE — Progress Notes (Signed)
Adult Psychoeducational Group Note  Date:  05/21/2016 Time:  11:54 PM  Group Topic/Focus:  Wrap-Up Group:   The focus of this group is to help patients review their daily goal of treatment and discuss progress on daily workbooks.   Participation Level:  Active  Participation Quality:  Appropriate  Affect:  Appropriate  Cognitive:  Alert, Appropriate and Oriented  Insight: Appropriate  Engagement in Group:  Engaged  Modes of Intervention:  Discussion  Additional Comments:  Patient attended group and stated that his day was a 1 (very good).  His goal for today was to learn to socialize more and he did.  He is still working on his coping skills.  Margaurite Salido W Gitel Beste 05/21/2016, 11:54 PM

## 2016-05-22 DIAGNOSIS — F1020 Alcohol dependence, uncomplicated: Secondary | ICD-10-CM

## 2016-05-22 DIAGNOSIS — F332 Major depressive disorder, recurrent severe without psychotic features: Principal | ICD-10-CM

## 2016-05-22 NOTE — Progress Notes (Signed)
D: Pt was isolative and withdrawn to self. Pt was observed pacing the hallway-Pt denied anxiety; states, "I do this every night as part of my bedtime routine." Pt also denied depression, SI, HI, pain or AVH. Pt remained calm and cooperative.  A: Medications offered as prescribed.  Support, encouragement, and safe environment provided.  15-minute safety checks continue. R: Pt was med compliant.  Pt did attended wrap-up group. Safety checks continue.

## 2016-05-22 NOTE — Progress Notes (Signed)
Patient attended AA group meeting.  

## 2016-05-22 NOTE — Progress Notes (Signed)
D: Pt alert and oriented 3. Pt denies SI, HI, AVH and pain. Endorsed anxiety on initial contact. Reported fair sleep, good appetite and hyperactive mood on self inventory sheet. Rated his depression 3/10, hopelessness 5/10 and anxiety 4.5/10. Visible in milieu pacing hall and isolative / with drawned.  A: Support and availability provided to pt. Scheduled and PRN (Vistaril--c/o anxiety) medications administered as prescribed.  Verbal encouragement offered towards treatment compliance.  Safety checks maintained at Q 15 minutes intervals without incidents.  R: Pt attended scheduled groups and was engaged. Verbalizes needs and concerns safely. Compliant with medications without issues. Remains safe on and off unit. POC continues.

## 2016-05-22 NOTE — BHH Group Notes (Signed)
BHH Group Notes:  (Clinical Social Work)  05/22/2016  10:00-11:00AM  Summary of Progress/Problems:   The main focus of today's process group was to   1)  Discuss the importance of adding supports  2)  Talk about the need for healthy supports to be able to pursue various healthy coping techniques, I.e. Need for a doctor if one is going to take medications for depression  3)  Identify the patient's current healthy supports and plan what to add.  An emphasis was placed on using counselor, doctor, therapy groups, 12-step groups, and problem-specific support groups to expand supports.    The patient expressed full comprehension of the concepts presented, and agreed that there is a need to add more supports.  The patient stated his current healthy supports include his brothers and mother, as well as the professional supports at [facility].  He wants to add a Secondary school teachereer Support Specialist as well.  He was very upbeat and encouraging to others about trying to follow up after discharge.  Type of Therapy:  Process Group with Motivational Interviewing  Participation Level:  Active  Participation Quality:  Attentive and Sharing  Affect:  Blunted  Cognitive:  Appropriate  Insight:  Engaged  Engagement in Therapy:  Engaged  Modes of Intervention:   Education, Teacher, English as a foreign languageupport and Processing, Activity  Ambrose MantleMareida Grossman-Orr, LCSW 05/22/2016

## 2016-05-22 NOTE — BHH Suicide Risk Assessment (Signed)
[facility]   Nursing information obtained from:    Demographic factors:    Current Mental Status:    Loss Factors:    Historical Factors:    Risk Reduction Factors:     Total Time spent with patient: 1.5 hours Principal Problem: <principal problem not specified> Diagnosis:   Patient Active Problem List   Diagnosis Date Noted  . MDD (major depressive disorder) (HCC) [F32.9] 05/21/2016  . Alcohol use disorder, severe, dependence (HCC) [F10.20] 11/05/2015  . 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 GAD (generalized anxiety disorder) [F41.1] 11/05/2015  . Alcohol dependence with uncomplicated withdrawal (HCC) [F10.230] 07/15/2015  . Cellulitis of right lower extremity [L03.115] 03/29/2015  . Alcohol dependence (HCC) [F10.20] 03/05/2012   Subjective Data: Alert and oriented. Had poor sleep. Feeling down and has been drinking. Detox protocol started   Continued Clinical Symptoms:  Alcohol Use Disorder Identification Test Final Score (AUDIT): 28 The "Alcohol Use Disorders Identification Test", Guidelines for Use in Primary Care, Second Edition.  World Science writerHealth Organization [facility](WHO). Score between 0-7:  no or low risk or alcohol related problems. Score between 8-15:  moderate risk of alcohol related problems. Score between 16-19:  high risk of alcohol related problems. Score 20 or above:  warrants further diagnostic evaluation for alcohol dependence and treatment.   CLINICAL FACTORS:   Severe Anxiety and/or Agitation Depression:   Anhedonia Comorbid alcohol abuse/dependence Insomnia Alcohol/Substance Abuse/Dependencies More than one psychiatric diagnosis Unstable or Poor Therapeutic Relationship Previous Psychiatric Diagnoses and Treatments   Musculoskeletal: Strength & Muscle Tone: within normal limits Gait & Station: normal Patient leans: no lean  Psychiatric Specialty Exam: Physical Exam  Constitutional: He appears  well-developed.  Neurological: He is alert.    Review of Systems  Cardiovascular: Negative for chest pain.  Neurological: Positive for tremors.  Psychiatric/Behavioral: Positive for depression and substance abuse. The patient is nervous/anxious.     Blood pressure 122/83, pulse 68, temperature 98 F (36.7 C), temperature source Oral, resp. rate 16, height 6\' 2"  (1.88 m), weight 82.1 kg (181 lb), SpO2 99 %.Body mass index is 23.24 kg/m.  General Appearance: Casual  Eye Contact:  Fair  Speech:  Normal Rate  Volume:  Decreased  Mood:  Depressed and Dysphoric  Affect:  Congruent  Thought Process:  Goal Directed  Orientation:  Full (Time, Place, and Person)  Thought Content:  rumination  Suicidal Thoughts:  No  Homicidal Thoughts:  No  Memory:  Immediate;   Fair Recent;   Fair  Judgement:  Poor  Insight:  Shallow  Psychomotor Activity:  Decreased  Concentration:  Concentration: Fair and Attention Span: Fair  Recall:  [employer]  Fund of Knowledge:  Fair  Language:  Fair  Akathisia:  No  Handed:  Right  AIMS (if indicated):     Assets:  Desire for Improvement  ADL's:  Intact  Cognition:  WNL  Sleep:  Number of Hours: 6.25      COGNITIVE FEATURES THAT CONTRIBUTE TO RISK:  Closed-mindedness    SUICIDE RISK:   Moderate:  Frequent suicidal ideation with limited intensity, and duration, some specificity in terms of plans, no associated intent, good self-control, limited dysphoria/symptomatology, some risk factors present, and identifiable protective factors, including available and accessible social support.   PLAN OF CARE: Admit for stabilization. Medication management and detox of alcohol.  I certify that inpatient services furnished can reasonably be expected to improve the patient's condition.  Thresa RossAKHTAR, Aquilla Voiles, MD 05/22/2016, 9:43 AM
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
## 2016-05-23 DIAGNOSIS — F332 Major depressive disorder, recurrent severe without psychotic features: Secondary | ICD-10-CM | POA: Diagnosis present

## 2016-05-23 DIAGNOSIS — B1920 Unspecified viral hepatitis C without hepatic coma: Secondary | ICD-10-CM

## 2016-05-23 LAB — TSH: TSH: 3.865 u[IU]/mL (ref 0.350–4.500)

## 2016-05-23 MED ORDER — FLUOXETINE HCL 20 MG PO CAPS
80.0000 mg | ORAL_CAPSULE | Freq: Every day | ORAL | Status: DC
  Administered 2016-05-24 – 2016-05-27 (×4): 80 mg via ORAL
  Filled 2016-05-23 (×3): qty 4
  Filled 2016-05-23: qty 28
  Filled 2016-05-23 (×2): qty 4

## 2016-05-23 MED ORDER — GABAPENTIN 300 MG PO CAPS
300.0000 mg | ORAL_CAPSULE | Freq: Three times a day (TID) | ORAL | Status: DC
  Administered 2016-05-23 – 2016-05-27 (×12): 300 mg via ORAL
  Filled 2016-05-23 (×2): qty 1
  Filled 2016-05-23: qty 21
  Filled 2016-05-23 (×6): qty 1
  Filled 2016-05-23: qty 21
  Filled 2016-05-23 (×4): qty 1
  Filled 2016-05-23: qty 21
  Filled 2016-05-23 (×5): qty 1

## 2016-05-23 NOTE — Plan of Care (Signed)
Problem: Education: Goal: Utilization of techniques to improve thought processes will improve Outcome: Progressing Nurse discussed depression/coping skills with patient.    

## 2016-05-23 NOTE — Progress Notes (Signed)
[facility]  05/23/2016 4:04 PM Delmar LandauJeffrey L Docken  MRN:  [id number] Subjective:  Patient states " I feel anxious ."  Objective:Pantelis L Rennis Hardingllis is a 60 y.o. caucasian male, who has a hx of alcoholism and depression , who presented voluntarily to ED requesting assistance with substance abuse treatment.    Patient seen and chart reviewed.Discussed patient with treatment team. Pt today is seen as anxious , with pushed speech , although goal directed. Pt continues to be motivated to go to a substance abuse treatment program. Discussed medication readjustment- he agrees. Per staff pt continues to have periodic anxiety sx , tolerating medications well. Will continue to encourage and support.   Principal Problem: MDD (major depressive disorder), recurrent severe, without psychosis (HCC) Diagnosis:   Patient Active Problem List   Diagnosis Date Noted  . MDD (major depressive disorder), recurrent severe, without psychosis (HCC) [F33.2] 05/23/2016  . Alcohol use disorder, severe, dependence (HCC) [F10.20] 11/05/2015  . 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 GAD (generalized anxiety disorder) [F41.1] 11/05/2015  . Cellulitis of right lower extremity [L03.115] 03/29/2015   Total Time spent with patient: 20 minutes  Past Psychiatric History: Please see H&P.   Past Medical History:  Past Medical History:  Diagnosis Date  . Alcoholism (HCC)   . Anxiety   . COPD (chronic obstructive pulmonary disease) (HCC)   . Depression   . Hep C w/o coma, chronic (HCC)   . Mental health disorder   . Scoliosis   . Thyroid disease     Past Surgical History:  Procedure Laterality Date  . SKIN CANCER EXCISION    . THYROID SURGERY     Family History:  Family History  Problem Relation Age of Onset  . Dementia Mother    Family Psychiatric  History: Please see H&P.  Social History:  History  Alcohol Use  . Yes    Comment: drank 1/5 today.     History  Drug Use   . Types: Cocaine, Marijuana    Comment: also states will take pain pills every now and then, last use of cocaine and ETOH today at 1400    Social History   Social History  . Marital status: Single    Spouse name: N/A  . Number of children: N/A  . Years of education: N/A   Social History Main Topics  . Smoking status: Current Every Day Smoker    Packs/day: 0.50    Years: 40.00    Types: Cigarettes  . Smokeless tobacco: Never Used  . Alcohol use Yes     Comment: drank 1/5 today.  . Drug use:     Types: Cocaine, Marijuana     Comment: also states will take pain pills every now and then, last use of cocaine and ETOH today at 1400  . Sexual activity: No   Other Topics Concern  . None   Social History Narrative   ** Merged History Encounter **       Additional Social History:                         Sleep: Fair  Appetite:  Fair  Current Medications: Current Facility-Administered Medications  Medication Dose Route Frequency Provider Last Rate Last Dose  . acetaminophen (TYLENOL) tablet 650 mg  650 mg Oral Q6H PRN Sanjuana KavaAgnes I Nwoko, NP      . chlordiazePOXIDE (LIBRIUM) capsule 25 mg  25 mg Oral Q6H PRN Nicole KindredAgnes  I Nwoko, NP      . chlordiazePOXIDE (LIBRIUM) capsule 25 mg  25 mg Oral BH-qamhs Sanjuana Kava, NP       Followed by  . [START ON 05/25/2016] chlordiazePOXIDE (LIBRIUM) capsule 25 mg  25 mg Oral Daily Sanjuana Kava, NP      . FLUoxetine (PROZAC) capsule 60 mg  60 mg Oral Daily Shuvon B Rankin, NP   60 mg at 05/23/16 0808  . gabapentin (NEURONTIN) capsule 300 mg  300 mg Oral TID Jomarie Longs, MD      . hydrOXYzine (ATARAX/VISTARIL) tablet 25 mg  25 mg Oral Q6H PRN Sanjuana Kava, NP   25 mg at 05/22/16 1159  . loperamide (IMODIUM) capsule 2-4 mg  2-4 mg Oral PRN Sanjuana Kava, NP      . magnesium hydroxide (MILK OF MAGNESIA) suspension 30 mL  30 mL Oral Daily PRN Shuvon B Rankin, NP      . multivitamin with minerals tablet 1 tablet  1 tablet Oral Daily Sanjuana Kava, NP   1 tablet at 05/23/16 [phone]  . nicotine (NICODERM CQ - dosed in mg/24 hours) patch 21 mg  21 mg Transdermal Daily Shuvon B Rankin, NP   21 mg at 05/23/16 0809  . ondansetron (ZOFRAN-ODT) disintegrating tablet 4 mg  4 mg Oral Q6H PRN Sanjuana Kava, NP      . thiamine (VITAMIN B-1) tablet 100 mg  100 mg Oral Daily Shuvon B Rankin, NP   100 mg at 05/23/16 0809  . traZODone (DESYREL) tablet 50 mg  50 mg Oral QHS PRN Kristeen Mans, NP   50 mg at 05/22/16 2152    Lab Results: No results found for this or any previous visit (from the past 48 hour(s)).  Blood Alcohol level:  Lab Results  Component Value Date   ETH 141 (H) 05/21/2016   ETH 173 (H) 05/04/2016    Metabolic Disorder Labs: No results found for: HGBA1C, MPG No results found for: PROLACTIN No results found for: CHOL, TRIG, HDL, CHOLHDL, VLDL, LDLCALC  Physical Findings: AIMS: Facial and Oral Movements Muscles of Facial Expression: None, normal Lips and Perioral Area: None, normal Jaw: None, normal Tongue: None, normal,Extremity Movements Upper (arms, wrists, hands, fingers): None, normal Lower (legs, knees, ankles, toes): None, normal, Trunk Movements Neck, shoulders, hips: None, normal, Overall Severity Severity of abnormal movements (highest score from questions above): None, normal Incapacitation due to abnormal movements: None, normal Patient's awareness of abnormal movements (rate only patient's report): No Awareness, Dental Status Current problems with teeth and/or dentures?: No Does patient usually wear dentures?: No  CIWA:  CIWA-Ar Total: 1 COWS:  COWS Total Score: 1  Musculoskeletal: Strength & Muscle Tone: within normal limits Gait & Station: normal Patient leans: N/A  Psychiatric Specialty Exam: Physical Exam  Nursing note and vitals reviewed.   Review of Systems  Psychiatric/Behavioral: Positive for substance abuse. The patient is nervous/anxious.   All other systems reviewed and are  negative.   Blood pressure 111/75, pulse 72, temperature 97.7 F (36.5 C), temperature source Oral, resp. rate 18, height 6\' 2"  (1.88 m), weight 82.1 kg (181 lb), SpO2 99 %.Body mass index is 23.24 kg/m.  General Appearance: Guarded  Eye Contact:  Fair  Speech:  pushed  Volume:  Normal  Mood:  Anxious and Depressed  Affect:  Congruent  Thought Process:  Goal Directed and Descriptions of Associations: Circumstantial  Orientation:  Full (Time, Place, and Person)  Thought  Content:  Rumination  Suicidal Thoughts:  No  Homicidal Thoughts:  No  Memory:  Immediate;   Fair Recent;   Fair Remote;   Fair  Judgement:  Impaired  Insight:  Shallow  Psychomotor Activity:  Restlessness  Concentration:  Concentration: Fair and Attention Span: Fair  Recall:  [employer] of Knowledge:  Fair  Language:  Fair  Akathisia:  No  Handed:  Right  AIMS (if indicated):     Assets:  Desire for Improvement  ADL's:  Intact  Cognition:  WNL  Sleep:  Number of Hours: 5.75     Treatment Plan Summary:effrey L Dotson is a 60 y.o. caucasian male, who has a hx of alcoholism and depression , who presented voluntarily to ED requesting assistance with substance abuse treatment.  Pt with continued anxiety, restlessness, will continue readjusting his medications.  Daily contact with patient to assess and evaluate symptoms and progress in treatment and Medication management Will continue CIWA/librium protocol for withdrawal sx. Will increase Prozac to 80 mg po daily for affective sx. Will change Gabapentin to 300 mg po tid for anxiety sx. Will continue Trazodone to 50 mg po qhs prn for sleep. Reviewed past medical records,treatment plan.  Will continue to monitor vitals ,medication compliance and treatment side effects while patient is here.  Will monitor for medical issues as well as call consult as needed. Pt with hx of Hep c- [facility]. Reviewed labs ,will order tsh.  HCV RNA quant. CSW will start working on disposition.  Patient to participate in therapeutic milieu .      Jasan Doughtie, MD 05/23/2016, 4:04 PM

## 2016-05-23 NOTE — [facility] Group Notes (Signed)
[facility] LCSW Group Therapy  05/23/2016 2:59 PM  Type of Therapy:  Group Therapy  Participation Level:  Active  Participation Quality:  Attentive  Affect:  Appropriate  Cognitive:  Appropriate  Insight:  Improving  Engagement in Therapy:  Engaged  Modes of Intervention:  Discussion, Education, Exploration, Problem-solving, Rapport Building, Socialization and Support  Summary of Progress/Problems: Today's Topic: Overcoming Obstacles. Patients identified one short term goal and potential obstacles in reaching this goal. Patients processed barriers involved in overcoming these obstacles. Patients identified steps necessary for overcoming these obstacles and explored motivation (internal and external) for facing these difficulties head on. Trey PaulaJeff was attentive and engaged during today's processing group. He shared that his biggest obstacle is "staying away from certain people who try to get me to drink with them." Trey PaulaJeff shared that he does well in the hospital setting. "I like being around people who are trying to get healthy and stay in recovery. " Trey PaulaJeff plans to resume AA groups and possibly [employer] peer support groups. He continues to make progress in the group setting with improving insight.   Smart, Brexley Cutshaw LCSW 05/23/2016, 2:59 PM
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
## 2016-05-23 NOTE — Progress Notes (Signed)
Recreation Therapy Notes  Date: 05/23/16 Time: 0930 Location: [address] Group Room  Group Topic: Stress Management  Goal Area(s) Addresses:  Patient will verbalize importance of using healthy stress management.  Patient will identify positive emotions associated with healthy stress management.   Intervention: Stress Management  Activity :  Forest Visualization.  LRT introduced pt to the technique of guided imagery.  Patients were to follow along as LRT read script to engage in the activity.   Education:  Stress Management, Discharge Planning.   Clinical Observations/Feedback: Pt did not attend group.     Charma Mocarski, LRT/CTRS  

## 2016-05-23 NOTE — [facility] Group Notes (Signed)
Pt attended spiritual care group on grief and loss facilitated by chaplain Audry Kauzlarich   Group opened with brief discussion and psycho-social ed around grief and loss in relationships and in relation to self - identifying life patterns, circumstances, changes that cause losses. Established group norm of speaking from own life experience. Group goal of establishing open and affirming space for members to share loss and experience with grief, normalize grief experience and provide psycho social education and grief support.   Group facilitation drew on Narrative, Adlerian, and brief CBT frameworks.  

## 2016-05-23 NOTE — Progress Notes (Signed)
D: Pt also denied anxiety, depression, SI, HI, pain or AVH. Pt was isolative and withdrawn to self even while in the dayroom. Pt was observed pacing the hallway; states, "remember I told you I do this before I go to bed." Pt remained calm, cooperative and pleasant. A: Medications offered as prescribed.  Support, encouragement, and safe environment provided.  15-minute safety checks continue. R: Pt was med compliant.  Pt did not attend AA group. Safety checks continue.

## 2016-05-23 NOTE — [facility] Group Notes (Signed)
[facility]   05/23/2016 10:01 AM  Participation Quality:  Appropriate   Mood/Affect:  Appropriate  Depression Rating:  5  Anxiety Rating:  5  Thoughts of Suicide:  No Will you contract for safety?   NA  Current AVH:  No  Plan for Discharge/Comments:  Pt reports lots of stressors at home. "I plan to go back home after a few more days here to clear my head." [facility] and is not interested in inpatient treatment. Pt reports mild withdrawals "tremors." Pt acknowledges "I'm a chronic alcoholic and need to get a handle on that."   Transportation Means: bus or family   Supports: some limited family supports   Counselling psychologistmart, Conservation officer, natureHeather LCSW

## 2016-05-23 NOTE — Progress Notes (Signed)
D:  Patient's self inventory sheet, patient sleeps good, medication helpful.  Good appetite, hyper energy level, poor concentration.  Rated depression 3, hopeless 4, anxiety 5.  Withdrawals, tremors.  Denied SI.  Physical problems, toothache, pain 4.5, no pain medication.  Goal it to pay attention in group, be nice to everyone.  Plans to do the best he can.  No discharge plans. A:  Medications administered per MD orders.  Emotional support and encouragement given patient. R:  Patient denied SI and HI, contracts for safety.  Denied A/V hallucinations.  Safety maintained with 15 minute checks.

## 2016-05-24 MED ORDER — HYDROXYZINE HCL 25 MG PO TABS
25.0000 mg | ORAL_TABLET | Freq: Four times a day (QID) | ORAL | Status: DC | PRN
  Administered 2016-05-24 – 2016-05-26 (×3): 25 mg via ORAL
  Filled 2016-05-24 (×2): qty 1
  Filled 2016-05-24: qty 10
  Filled 2016-05-24: qty 1

## 2016-05-24 NOTE — [facility] Group Notes (Signed)

## 2016-05-24 NOTE — Progress Notes (Signed)
D:  Patient's self inventory sheet, patient sleeps good, sleep medication is helpful.  Good appetite, hyper energy level, poor concentration.  Rated depression 5, hopeless 4, anxiety 6.  Withdrawals, tremors, agitation.  Denied SI.  Physical problems, knees, shoulder, worst pain #5 in past 24 hours.  No pain medication.  Goal is get more out of my classes and maintain good impression of myself.  Plans to be attentive.  "Thank you.  I have no complaints." A:  Medications administered per MD orders.  Emotional support and encouragement given patient. R:  Denied SI and HI.  Denied A/V hallucinations.  Contracts for safety.  Safety maintained with 15 minute checks.

## 2016-05-24 NOTE — Progress Notes (Signed)
Patient seen watching TV and interacting with peers. Denies pain, SI, AH/VH at this time. Endorses anxiety. No behavioral issues noted.   Staff offered supports and encouragement as needed. Every 15 minutes check maintained for safety. Will continue to monitor patient for safety and stability.

## 2016-05-24 NOTE — [facility] Group Notes (Signed)
[facility] LCSW Group Therapy  05/24/2016 1:26 PM  Type of Therapy:  Group Therapy  Participation Level:  Did Not Attend--pt invited. Chose to rest in room.   Summary of Progress/Problems: MHA Speaker came to talk about his personal journey with substance abuse and addiction.   Smart, Maeola Mchaney LCSW 05/24/2016, 1:26 PM

## 2016-05-24 NOTE — Progress Notes (Signed)
Patient ID: Tim [name], male   DOB: 09/05/1956, 60 y.o.   MRN: [id number] [facility] MD Progress Note  05/24/2016 12:21 PM Tim [name]  MRN:  [id number] Subjective:  Patient reports he is feeling better than on admission . He states he is still depressed, but " better", and at this time denies any suicidal ideations .  Denies medication side effects. Denies any major symptoms of alcohol withdrawal at this time, and does not appear to be in any acute discomfort or distress .  Objective: I have discussed case with treatment team and have met with patient . Patient is a 60 year old male, history of alcohol dependence and of depression, reports recent suicidal ideations with thoughts of walking in front of a train / Reports he is feeling partially better, less severely depressed, and no longer having any suicidal ideations . Contracts for safety on the unit . At this time not presenting with significant alcohol WDL symptoms, no significant tremors, no diaphoresis, no acute distress . Denies medication side effects. No disruptive or agitated behaviors on unit  TSH WNL.    Principal Problem: MDD (major depressive disorder), recurrent severe, without psychosis (HCC) Diagnosis:   Patient Active Problem List   Diagnosis Date Noted  . MDD (major depressive disorder), recurrent severe, without psychosis (HCC) [F33.2] 05/23/2016  . Hepatitis C [B19.20] 05/23/2016  . Alcohol use disorder, severe, dependence (HCC) [F10.20] 11/05/2015  . 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 GAD (generalized anxiety disorder) [F41.1] 11/05/2015  . Cellulitis of right lower extremity [L03.115] 03/29/2015   Total Time spent with patient: 20 minutes  Past Psychiatric History: Please see H&P.   Past Medical History:  Past Medical History:  Diagnosis Date  . Alcoholism (HCC)   . Anxiety   . COPD (chronic obstructive pulmonary disease) (HCC)   . Depression   . Hep C w/o coma, chronic  (HCC)   . Mental health disorder   . Scoliosis   . Thyroid disease     Past Surgical History:  Procedure Laterality Date  . SKIN CANCER EXCISION    . THYROID SURGERY     Family History:  Family History  Problem Relation Age of Onset  . Dementia Mother    Family Psychiatric  History: Please see H&P.  Social History:  History  Alcohol Use  . Yes    Comment: drank 1/5 today.     History  Drug Use  . Types: Cocaine, Marijuana    Comment: also states will take pain pills every now and then, last use of cocaine and ETOH today at 1400    Social History   Social History  . Marital status: Single    Spouse name: N/A  . Number of children: N/A  . Years of education: N/A   Social History Main Topics  . Smoking status: Current Every Day Smoker    Packs/day: 0.50    Years: 40.00    Types: Cigarettes  . Smokeless tobacco: Never Used  . Alcohol use Yes     Comment: drank 1/5 today.  . Drug use:     Types: Cocaine, Marijuana     Comment: also states will take pain pills every now and then, last use of cocaine and ETOH today at 1400  . Sexual activity: No   Other Topics Concern  . None   Social History Narrative   ** Merged History Encounter **       Additional Social History:     Sleep: improving   Appetite:  Improving   Current Medications: Current Facility-Administered Medications  Medication Dose Route Frequency Provider Last Rate Last Dose  . acetaminophen (TYLENOL) tablet 650 mg  650 mg Oral Q6H PRN Agnes I Nwoko, NP   650 mg at 05/24/16 0814  . chlordiazePOXIDE (LIBRIUM) capsule 25 mg  25 mg Oral Q6H PRN Agnes I Nwoko, NP      . [START ON 05/25/2016] chlordiazePOXIDE (LIBRIUM) capsule 25 mg  25 mg Oral Daily Agnes I Nwoko, NP      . FLUoxetine (PROZAC) capsule 80 mg  80 mg Oral Daily Saramma Eappen, MD   80 mg at 05/24/16 0807  . gabapentin (NEURONTIN) capsule 300 mg  300 mg Oral TID Saramma Eappen, MD   300 mg at 05/24/16 0808  . hydrOXYzine (ATARAX/VISTARIL)  tablet 25 mg  25 mg Oral Q6H PRN Agnes I Nwoko, NP   25 mg at 05/23/16 2143  . loperamide (IMODIUM) capsule 2-4 mg  2-4 mg Oral PRN Agnes I Nwoko, NP      . magnesium hydroxide (MILK OF MAGNESIA) suspension 30 mL  30 mL Oral Daily PRN Shuvon B Rankin, NP      . multivitamin with minerals tablet 1 tablet  1 tablet Oral Daily Agnes I Nwoko, NP   1 tablet at 05/24/16 0808  . nicotine (NICODERM CQ - dosed in mg/24 hours) patch 21 mg  21 mg Transdermal Daily Shuvon B Rankin, NP   21 mg at 05/24/16 0809  . ondansetron (ZOFRAN-ODT) disintegrating tablet 4 mg  4 mg Oral Q6H PRN Agnes I Nwoko, NP      . thiamine (VITAMIN B-1) tablet 100 mg  100 mg Oral Daily Shuvon B Rankin, NP   100 mg at 05/24/16 0808  . traZODone (DESYREL) tablet 50 mg  50 mg Oral QHS PRN Fran E Hobson, NP   50 mg at 05/23/16 2143    Lab Results:  Results for orders placed or performed during the hospital encounter of 05/21/16 (from the past 48 hour(s))  TSH     Status: None   Collection Time: 05/23/16  6:22 PM  Result Value Ref Range   TSH 3.865 0.350 - 4.500 uIU/mL    Comment: Performed at [facility]    Blood Alcohol level:  Lab Results  Component Value Date   ETH 141 (H) 05/21/2016   ETH 173 (H) 05/04/2016    Metabolic Disorder Labs: No results found for: HGBA1C, MPG No results found for: PROLACTIN No results found for: CHOL, TRIG, HDL, CHOLHDL, VLDL, LDLCALC  Physical Findings: AIMS: Facial and Oral Movements Muscles of Facial Expression: None, normal Lips and Perioral Area: None, normal Jaw: None, normal Tongue: None, normal,Extremity Movements Upper (arms, wrists, hands, fingers): None, normal Lower (legs, knees, ankles, toes): None, normal, Trunk Movements Neck, shoulders, hips: None, normal, Overall Severity Severity of abnormal movements (highest score from questions above): None, normal Incapacitation due to abnormal movements: None, normal Patient's awareness of abnormal movements  (rate only patient's report): No Awareness, Dental Status Current problems with teeth and/or dentures?: No Does patient usually wear dentures?: No  CIWA:  CIWA-Ar Total: 3 COWS:  COWS Total Score: 1  Musculoskeletal: Strength & Muscle Tone: within normal limits Gait & Station: normal Patient leans: N/A  Psychiatric Specialty Exam: Physical Exam  Nursing note and vitals reviewed.   Review of Systems  Psychiatric/Behavioral: Positive for substance abuse. The patient is nervous/anxious.   All other systems reviewed and are   negative.   Blood pressure 110/67, pulse 65, temperature 97.7 F (36.5 C), temperature source Oral, resp. rate 16, height 6' 2" (1.88 m), weight 181 lb (82.1 kg), SpO2 99 %.Body mass index is 23.24 kg/m.  General Appearance: Fairly Groomed  Eye Contact:  Good  Speech:  Normal Rate  Volume:  Normal  Mood:  Reports improving mood, still presents somewhat depressed, anxious   Affect:  Anxious, does smile briefly , appropriately at times   Thought Process:  Linear   Orientation:  Full (Time, Place, and Person)  Thought Content: denies hallucinations,no delusions expressed   Suicidal Thoughts:  No currently denies active suicidal ideations or self injurious ideations, contracts for safety on the unit   Homicidal Thoughts:  No denies any homicidal ideations   Memory: recent and remote grossly intact   Judgement:  Fair   Insight:  Fair   Psychomotor Activity: normal, no gross tremors , no diaphoresis   Concentration:  Concentration: Good and Attention Span: Good  Recall:  Good  Fund of Knowledge:  Good  Language:  Good  Akathisia:  No  Handed:  Right  AIMS (if indicated):     Assets:  Desire for Improvement  ADL's:  Intact  Cognition:  WNL  Sleep:  Number of Hours: 6.75     Treatment Plan Summary: patient reports partial improvement in mood , states feels better, still presents vaguely depressed, anxious. Denies suicidal ideations . At this time not  presenting with symptoms of alcohol withdrawal. Tolerating medications well, denies side effects.    Daily contact with patient to assess and evaluate symptoms and progress in treatment and Medication management Will continue CIWA/librium protocol for withdrawal sx. Will increase Prozac to 80 mg po daily for affective sx. Will change Gabapentin to 300 mg po tid for anxiety sx. Will continue Trazodone to 50 mg po qhs prn for sleep. Treatment team working on disposition planning options     Neita Garnet, MD 05/24/2016, 12:21 PM

## 2016-05-24 NOTE — Progress Notes (Signed)
Recreation Therapy Notes  Animal-Assisted Activity (AAA) Program Checklist/Progress Notes Patient Eligibility Criteria Checklist & Daily Group note for Rec TxIntervention  Date: 08.08.2017 Time: 2:45pm [address]: [address]    AAA/T Program Assumption of Risk Form signed by Patient/ or Parent Legal Guardian Yes  Patient is free of allergies or sever asthma Yes  Patient reports no fear of animals Yes  Patient reports no history of cruelty to animals Yes  Patient understands his/her participation is voluntary Yes  Behavioral Response: Did not attend.   Marykay Lexenise L Dane Bloch, LRT/CTRS   Hatem Cull L 05/24/2016 3:01 PM

## 2016-05-24 NOTE — Plan of Care (Signed)
Problem: Education: Goal: Knowledge of [facility] Education information/materials will improve Outcome: Progressing Nurse discussed depression/anxiety/coping skills with patient.    

## 2016-05-24 NOTE — [facility] Group Notes (Signed)
Patient attended AA group meeting.  

## 2016-05-25 DIAGNOSIS — F411 Generalized anxiety disorder: Secondary | ICD-10-CM

## 2016-05-25 LAB — HCV RNA QUANT: HCV Quantitative: NOT DETECTED IU/mL (ref >50)

## 2016-05-25 NOTE — Progress Notes (Signed)
Patient has been on the unit in the day room for the majority of the shift.  Patient denies SI, HI, and AVH this shift, but has reported a increased anxiety due to cravings.  Patient has had a bright affect and been engaged in care this shift.   Assess patient for safety, offer medications as prescribed, engage patient in 1:1 staff talks.   Continue to monitor as prescribed, patient able to contract for safety.

## 2016-05-25 NOTE — Progress Notes (Signed)
Recreation Therapy Notes  Date: 05/25/16  Time: 0930 Location: [address] Group Room  Group Topic: Stress Management  Goal Area(s) Addresses:  Patient will verbalize importance of using healthy stress management.  Patient will identify positive emotions associated with healthy stress management.   Intervention: Stress Management  Activity :  Deep Breathing.  LRT introduced patients to the stress management technique of deep breathing.  Pt were to follow along as LRT demonstrated and read a script to allow patients to participate in the activity.  Education:  Stress Management, Discharge Planning.   Education Outcome: Acknowledges edcuation/In group clarification offered/Needs additional education  Clinical Observations/Feedback: Pt did not attend group.    Cyd Hostler, LRT/CTRS  

## 2016-05-25 NOTE — Progress Notes (Signed)
Patient ID: Tim [name], male   DOB: February 11, 1956, 60 y.o.   MRN: [id number] Patient ID: Tim [name], male   DOB: 08-08-1956, 60 y.o.   MRN: [id number] [facility] MD Progress Note  05/25/2016 1:57 PM MILFRED KRAMMES  MRN:  [id number]  Subjective:  [name] reports, "My mood is getting to feeling better some. I know that alcoholism brings depression because I was down & out when I came to this hospital. Now the alcohol is getting out of my system, my mood improving. I still have a while to go get my mood happy again".  Objective: I have discussed case with treatment team and have met with patient . Patient is a 60 year old male, history of alcohol dependence and of depression, reports recent suicidal ideations with thoughts of walking in front of a train / Reports he is feeling partially better, less severely depressed, and no longer having any suicidal ideations . Contracts for safety on the unit . At this time not presenting with significant alcohol WDL symptoms, no significant tremors, no diaphoresis, no acute distress . Denies medication side effects. No disruptive or agitated behaviors on unit. Is participating in some group milieu. TSH WNL.  Principal Problem: MDD (major depressive disorder), recurrent severe, without psychosis ([place]) Diagnosis:   Patient Active Problem List   Diagnosis Date Noted  . MDD (major depressive disorder), recurrent severe, without psychosis ([place]) [F33.2] 05/23/2016  . Hepatitis C [B19.20] 05/23/2016  . Alcohol use disorder, severe, dependence ([place]) [F10.20] 11/05/2015  . Depression, major, recurrent, moderate ([place]) [F33.1] 11/05/2015  . GAD (generalized anxiety disorder) [F41.1] 11/05/2015  . Cellulitis of right lower extremity [L03.115] 03/29/2015   Total Time spent with patient: 20 minutes  Past Psychiatric History: Alcoholism, chronic.  Past Medical History:  Past Medical History:  Diagnosis Date  . Alcoholism ([place])   . Anxiety   . COPD (chronic  obstructive pulmonary disease) ([place])   . Depression   . Hep C w/o coma, chronic ([place])   . Mental health disorder   . Scoliosis   . Thyroid disease     Past Surgical History:  Procedure Laterality Date  . SKIN CANCER EXCISION    . THYROID SURGERY     Family History:  Family History  Problem Relation Age of Onset  . Dementia Mother    Family Psychiatric  History: Please see H&P.  Social History:  History  Alcohol Use  . Yes    Comment: drank 1/5 today.     History  Drug Use  . Types: Cocaine, Marijuana    Comment: also states will take pain pills every now and then, last use of cocaine and ETOH today at 1400    Social History   Social History  . Marital status: Single    Spouse name: N/A  . Number of children: N/A  . Years of education: N/A   Social History Main Topics  . Smoking status: Current Every Day Smoker    Packs/day: 0.50    Years: 40.00    Types: Cigarettes  . Smokeless tobacco: Never Used  . Alcohol use Yes     Comment: drank 1/5 today.  . Drug use:     Types: Cocaine, Marijuana     Comment: also states will take pain pills every now and then, last use of cocaine and ETOH today at 1400  . Sexual activity: No   Other Topics Concern  . None   Social History Narrative   ** Merged  History Encounter **       Additional Social History:   Sleep: Improving   Appetite:  Improving   Current Medications: Current Facility-Administered Medications  Medication Dose Route Frequency Provider Last Rate Last Dose  . acetaminophen (TYLENOL) tablet 650 mg  650 mg Oral Q6H PRN Encarnacion Slates, NP   650 mg at 05/24/16 0814  . FLUoxetine (PROZAC) capsule 80 mg  80 mg Oral Daily Ursula Alert, MD   80 mg at 05/25/16 0827  . gabapentin (NEURONTIN) capsule 300 mg  300 mg Oral TID Ursula Alert, MD   300 mg at 05/25/16 1214  . hydrOXYzine (ATARAX/VISTARIL) tablet 25 mg  25 mg Oral Q6H PRN Laverle Hobby, PA-C   25 mg at 05/24/16 2220  . magnesium hydroxide (MILK  OF MAGNESIA) suspension 30 mL  30 mL Oral Daily PRN Shuvon B Rankin, NP      . multivitamin with minerals tablet 1 tablet  1 tablet Oral Daily Encarnacion Slates, NP   1 tablet at 05/25/16 0827  . nicotine (NICODERM CQ - dosed in mg/24 hours) patch 21 mg  21 mg Transdermal Daily Shuvon B Rankin, NP   21 mg at 05/25/16 0826  . thiamine (VITAMIN B-1) tablet 100 mg  100 mg Oral Daily Shuvon B Rankin, NP   100 mg at 05/25/16 0827  . traZODone (DESYREL) tablet 50 mg  50 mg Oral QHS PRN Lurena Nida, NP   50 mg at 05/24/16 2220   Lab Results:  Results for orders placed or performed during the hospital encounter of 05/21/16 (from the past 48 hour(s))  TSH     Status: None   Collection Time: 05/23/16  6:22 PM  Result Value Ref Range   TSH 3.865 0.350 - 4.500 uIU/mL    Comment: Performed at [name]   Blood Alcohol level:  Lab Results  Component Value Date   ETH 141 (H) 05/21/2016   ETH 173 (H) 97/35/3299   Metabolic Disorder Labs: No results found for: HGBA1C, MPG No results found for: PROLACTIN No results found for: CHOL, TRIG, HDL, CHOLHDL, VLDL, LDLCALC  Physical Findings: AIMS: Facial and Oral Movements Muscles of Facial Expression: None, normal Lips and Perioral Area: None, normal Jaw: None, normal Tongue: None, normal,Extremity Movements Upper (arms, wrists, hands, fingers): None, normal Lower (legs, knees, ankles, toes): None, normal, Trunk Movements Neck, shoulders, hips: None, normal, Overall Severity Severity of abnormal movements (highest score from questions above): None, normal Incapacitation due to abnormal movements: None, normal Patient's awareness of abnormal movements (rate only patient's report): No Awareness, Dental Status Current problems with teeth and/or dentures?: No Does patient usually wear dentures?: No  CIWA:  CIWA-Ar Total: 1 COWS:  COWS Total Score: 1  Musculoskeletal: Strength & Muscle Tone: within normal limits Gait & Station:  normal Patient leans: N/A  Psychiatric Specialty Exam: Physical Exam  Nursing note and vitals reviewed.   Review of Systems  Psychiatric/Behavioral: Positive for substance abuse. The patient is nervous/anxious.   All other systems reviewed and are negative.   Blood pressure 106/65, pulse 74, temperature 97.8 F (36.6 C), temperature source Oral, resp. rate 16, height _0  (1.88 m), weight 82.1 kg (181 lb), SpO2 99 %.Body mass index is 23.24 kg/m.  General Appearance: Fairly Groomed  Eye Contact:  Good  Speech:  Normal Rate  Volume:  Normal  Mood:  Reports improving mood, still presents somewhat depressed, anxious   Affect:  Anxious, does smile briefly ,  appropriately at times   Thought Process:  Linear   Orientation:  Full (Time, Place, and Person)  Thought Content: denies hallucinations,no delusions expressed   Suicidal Thoughts:  No currently denies active suicidal ideations or self injurious ideations, contracts for safety on the unit   Homicidal Thoughts:  No denies any homicidal ideations   Memory: recent and remote grossly intact   Judgement:  Fair   Insight:  Fair   Psychomotor Activity: normal, no gross tremors , no diaphoresis   Concentration:  Concentration: Good and Attention Span: Good  Recall:  Good  Fund of Knowledge:  Good  Language:  Good  Akathisia:  No  Handed:  Right  AIMS (if indicated):     Assets:  Desire for Improvement  ADL's:  Intact  Cognition:  WNL  Sleep:  Number of Hours: 6.25   Treatment Plan Summary: patient reports partial improvement in mood , states feels better, still presents vaguely depressed, anxious. Denies suicidal ideations . At this time not presenting with symptoms of alcohol withdrawal. Tolerating medications well, denies side effects.   Daily contact with patient to assess and evaluate symptoms and progress in treatment and Medication management Will continue CIWA/librium protocol for ETOH/Benzodiazepine withdrawal sx. Will  continue Prozac 80 mg po daily for affective symptoms. Will continue Gabapentin 300 mg po tid for agitation/substance withdrawal symptoms. Will continue Trazodone to 50 mg po qhs prn for sleep. Treatment team working on disposition planning options  Encarnacion Slates, NP, PMHNP, FNP-BC 05/25/2016, 1:57 PM   Discussed the case and I agree with the note written by Ms. Lindell Spar, NP as above.

## 2016-05-25 NOTE — Progress Notes (Addendum)
Pt has been observed in the dayroom all evening either watching TV or talking with peers.  Pt reports he had a fairly good day and says that his withdrawal symptoms are decreasing although he is still having cravings for alcohol.  Pt is pleasant and cooperative with staff.  He denies SI/HI/AVH.  He makes his needs known to staff.  He is open to rehab for his alcohol addiction.  Support and encouragement offered.  Discharge plans are in process.  Safety maintained with q15 minute checks.

## 2016-05-25 NOTE — [facility] Group Notes (Signed)
[facility]   05/25/2016 9:38 AM  Participation Quality:  Appropriate   Mood/Affect:  Appropriate  Depression Rating:  5  Anxiety Rating:  6  Thoughts of Suicide:  No Will you contract for safety?   NA  Current AVH:  No  Plan for Discharge/Comments:  Pt reports that his mood is continuing to improve "although I have racing thoughts and I want the doctor to evaluate me for ADD or ADHD." Pt plans to discharge home on Friday and [facility].   Transportation Means: family member  Supports: some family members identified.   Smart, Tim Beauregard LCSW

## 2016-05-26 MED ORDER — IBUPROFEN 600 MG PO TABS: 600.0000 mg | ORAL_TABLET | Freq: Four times a day (QID) | ORAL | Status: DC | PRN

## 2016-05-26 MED ORDER — DICLOFENAC SODIUM 1 % TD GEL
2.0000 g | Freq: Four times a day (QID) | TRANSDERMAL | Status: DC
  Administered 2016-05-26 – 2016-05-27 (×5): 2 g via TOPICAL
  Filled 2016-05-26: qty 100

## 2016-05-26 NOTE — Progress Notes (Signed)
Patient ID: Tim [name], male   DOB: 1956-03-27, 60 y.o.   MRN: [id number] Patient ID: Tim [name], male   DOB: 09/19/1956, 60 y.o.   MRN: [id number] Patient ID: Tim [name], male   DOB: 01-27-1956, 60 y.o.   MRN: [id number] [facility] MD Progress Note  05/26/2016 2:28 PM THOMAS MABRY  MRN:  [id number]  Subjective:  [name] reports, "I feel a little anxious today. But my mood is better. I feel like I have improved a lot. I would like to be discharged in the morning by 10:00 am. One thing that I will suggest about this hospital is to do more group sessions. The current group session schedule is not enough for everyone that needs to learn coping skills".  Objective: I have discussed case with treatment team and have met with patient . Patient is a 60 year old male, history of alcohol dependence and of depression, reports recent suicidal ideations with thoughts of walking in front of a train / Reports he is feeling a lot better, less severely depressed, and no longer having any suicidal ideations . Contracts for safety on the unit . At this time not presenting with significant alcohol WDL symptoms, no significant tremors, no diaphoresis, no acute distress . Denies medication side effects. No disruptive or agitated behaviors on unit. Is participating in some group milieu. TSH WNL. Wants to be discharged in am.  Principal Problem: MDD (major depressive disorder), recurrent severe, without psychosis ([place]) Diagnosis:   Patient Active Problem List   Diagnosis Date Noted  . MDD (major depressive disorder), recurrent severe, without psychosis ([place]) [F33.2] 05/23/2016  . Hepatitis C [B19.20] 05/23/2016  . Alcohol use disorder, severe, dependence ([place]) [F10.20] 11/05/2015  . Depression, major, recurrent, moderate ([place]) [F33.1] 11/05/2015  . GAD (generalized anxiety disorder) [F41.1] 11/05/2015  . Cellulitis of right lower extremity [L03.115] 03/29/2015   Total Time spent with patient: 15  minutes  Past Psychiatric History: Alcoholism, chronic.  Past Medical History:  Past Medical History:  Diagnosis Date  . Alcoholism ([place])   . Anxiety   . COPD (chronic obstructive pulmonary disease) ([place])   . Depression   . Hep C w/o coma, chronic ([place])   . Mental health disorder   . Scoliosis   . Thyroid disease     Past Surgical History:  Procedure Laterality Date  . SKIN CANCER EXCISION    . THYROID SURGERY     Family History:  Family History  Problem Relation Age of Onset  . Dementia Mother    Family Psychiatric  History: Please see H&P.  Social History:  History  Alcohol Use  . Yes    Comment: drank 1/5 today.     History  Drug Use  . Types: Cocaine, Marijuana    Comment: also states will take pain pills every now and then, last use of cocaine and ETOH today at 1400    Social History   Social History  . Marital status: Single    Spouse name: N/A  . Number of children: N/A  . Years of education: N/A   Social History Main Topics  . Smoking status: Current Every Day Smoker    Packs/day: 0.50    Years: 40.00    Types: Cigarettes  . Smokeless tobacco: Never Used  . Alcohol use Yes     Comment: drank 1/5 today.  . Drug use:     Types: Cocaine, Marijuana     Comment: also states will take pain pills  every now and then, last use of cocaine and ETOH today at 1400  . Sexual activity: No   Other Topics Concern  . None   Social History Narrative   ** Merged History Encounter **       Additional Social History:   Sleep: Improving   Appetite:  Improving   Current Medications: Current Facility-Administered Medications  Medication Dose Route Frequency Provider Last Rate Last Dose  . acetaminophen (TYLENOL) tablet 650 mg  650 mg Oral Q6H PRN Encarnacion Slates, NP   650 mg at 05/26/16 0801  . diclofenac sodium (VOLTAREN) 1 % transdermal gel 2 g  2 g Topical QID Kerrie Buffalo, NP   2 g at 05/26/16 1137  . FLUoxetine (PROZAC) capsule 80 mg  80 mg Oral Daily  Ursula Alert, MD   80 mg at 05/26/16 0758  . gabapentin (NEURONTIN) capsule 300 mg  300 mg Oral TID Ursula Alert, MD   300 mg at 05/26/16 1201  . hydrOXYzine (ATARAX/VISTARIL) tablet 25 mg  25 mg Oral Q6H PRN Laverle Hobby, PA-C   25 mg at 05/25/16 2205  . magnesium hydroxide (MILK OF MAGNESIA) suspension 30 mL  30 mL Oral Daily PRN Shuvon B Rankin, NP      . multivitamin with minerals tablet 1 tablet  1 tablet Oral Daily Encarnacion Slates, NP   1 tablet at 05/26/16 0759  . nicotine (NICODERM CQ - dosed in mg/24 hours) patch 21 mg  21 mg Transdermal Daily Shuvon B Rankin, NP   21 mg at 05/26/16 0758  . thiamine (VITAMIN B-1) tablet 100 mg  100 mg Oral Daily Shuvon B Rankin, NP   100 mg at 05/26/16 0759  . traZODone (DESYREL) tablet 50 mg  50 mg Oral QHS PRN Lurena Nida, NP   50 mg at 05/25/16 2205   Lab Results:  No results found for this or any previous visit (from the past 48 hour(s)).  Blood Alcohol level:  Lab Results  Component Value Date   ETH 141 (H) 05/21/2016   ETH 173 (H) 71/03/2693   Metabolic Disorder Labs: No results found for: HGBA1C, MPG No results found for: PROLACTIN No results found for: CHOL, TRIG, HDL, CHOLHDL, VLDL, LDLCALC  Physical Findings: AIMS: Facial and Oral Movements Muscles of Facial Expression: None, normal Lips and Perioral Area: None, normal Jaw: None, normal Tongue: None, normal,Extremity Movements Upper (arms, wrists, hands, fingers): None, normal Lower (legs, knees, ankles, toes): None, normal, Trunk Movements Neck, shoulders, hips: None, normal, Overall Severity Severity of abnormal movements (highest score from questions above): None, normal Incapacitation due to abnormal movements: None, normal Patient's awareness of abnormal movements (rate only patient's report): No Awareness, Dental Status Current problems with teeth and/or dentures?: No Does patient usually wear dentures?: No  CIWA:  CIWA-Ar Total: 0 COWS:  COWS Total Score:  1  Musculoskeletal: Strength & Muscle Tone: within normal limits Gait & Station: normal Patient leans: N/A  Psychiatric Specialty Exam: Physical Exam  Nursing note and vitals reviewed.   Review of Systems  Psychiatric/Behavioral: Positive for substance abuse. The patient is nervous/anxious.   All other systems reviewed and are negative.   Blood pressure 98/66, pulse 76, temperature 98 F (36.7 C), temperature source Oral, resp. rate 20, height 6' 2" (1.88 m), weight 82.1 kg (181 lb), SpO2 99 %.Body mass index is 23.24 kg/m.  General Appearance: Fairly Groomed  Eye Contact:  Good  Speech:  Normal Rate  Volume:  Normal  Mood:  Reports improving mood, still presents somewhat depressed, anxious   Affect:  Anxious, does smile briefly , appropriately at times   Thought Process:  Linear   Orientation:  Full (Time, Place, and Person)  Thought Content: denies hallucinations,no delusions expressed   Suicidal Thoughts:  No currently denies active suicidal ideations or self injurious ideations, contracts for safety on the unit   Homicidal Thoughts:  No denies any homicidal ideations   Memory: recent and remote grossly intact   Judgement:  Fair   Insight:  Fair   Psychomotor Activity: normal, no gross tremors , no diaphoresis   Concentration:  Concentration: Good and Attention Span: Good  Recall:  Good  Fund of Knowledge:  Good  Language:  Good  Akathisia:  No  Handed:  Right  AIMS (if indicated):     Assets:  Desire for Improvement  ADL's:  Intact  Cognition:  WNL  Sleep:  Number of Hours: 6.5   Treatment Plan Summary: patient reports partial improvement in mood , states feels better, still presents vaguely depressed, anxious. Denies suicidal ideations . At this time not presenting with symptoms of alcohol withdrawal. Tolerating medications well, denies side effects.   Daily contact with patient to assess and evaluate symptoms and progress in treatment and Medication  management Completed CIWA/librium protocol for ETOH/Benzodiazepine withdrawal sx. Will continue Prozac 80 mg po daily for affective symptoms. Will continue Gabapentin 300 mg po tid for agitation/substance withdrawal symptoms. Will continue Trazodone to 50 mg po qhs prn for sleep. Treatment team working on disposition planning options. Patient wants to be discharged in am 05-27-16.  Encarnacion Slates, NP, PMHNP, FNP-BC 05/26/2016, 2:28 PM

## 2016-05-26 NOTE — Progress Notes (Signed)
Nursing Note 05/26/2016 [id number]  Data Reports sleeping good with sleep med.  Rates depression 5/10, hopelessness 6/10, and anxiety 3/10. Affect bright mood euthymic.  Denies HI, SI, AVH.  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  C/O chronic left knee pain.  Observed going to groups, meals, and spending free time in day area.  Action Spoke with patient 1:1, nurse offered support to patient throughout shift.  Given tylenol for knee pain, NP notified of knee pain received orders for Voltaren gel- given to patient.  Continues to be monitored on 15 minute checks for safety.  Response Patient states Voltaren gel/tylenol effective.  Remains safe and appropriate on unit.

## 2016-05-26 NOTE — Progress Notes (Signed)
Nutrition Education Note  Pt attended group focusing on general, healthful nutrition education.  RD emphasized the importance of eating regular meals and snacks throughout the day. Consuming sugar-free beverages and incorporating fruits and vegetables into diet when possible. Provided examples of healthy snacks. Patient encouraged to leave group with a goal to improve nutrition/healthy eating.   Diet Order: Diet regular Room service appropriate?: Yes; Fluid consistency:: Thin Pt is also offered choice of unit snacks mid-morning and mid-afternoon.  Pt is eating as desired.   If additional nutrition issues arise, please consult RD.  [name], MS, RD, LDN Pager: [phone] After Hours Pager: [phone]     

## 2016-05-26 NOTE — Progress Notes (Signed)
Patient ID: Delmar LandauJeffrey L Ramnauth, male   DOB: 1956/08/02, 60 y.o.   MRN: [id number] D: Client visible on the unit, reports no concerns at this time. "mind feel good, body feel good, I'm alive" Client reports he will be going home upon discharge. "[place] some chickens and a dog, he chases the chickens." A: Clinical research associateWriter provided emotional support, reviewed medication, administered as ordered. Staff will monitor q4315min for safety. R: client is safe on the unit, attended karaoke.

## 2016-05-26 NOTE — Progress Notes (Signed)
Pt has been in the dayroom most of the evening.  He denies SI/HI/AVH.  He reports he had a good day and says that he is supposed to discharge home on Friday.  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  Pt says he plans [facility] and [facility].  Pt has been pleasant and cooperative with staff.  Support and encouragement offered.  Discharge plans are in process.  Safety maintained with q15 minute checks.

## 2016-05-26 NOTE — [facility] Suicide Risk Assessment (Signed)
[facility] INPATIENT:  Family/Significant Other Suicide Prevention Education  Suicide Prevention Education:  Education Completed; Tim [name] (pt's mother) [phone] has been identified by the patient as the family member/significant other with whom the patient will be residing, and identified as the person(s) who will aid the patient in the event of a mental health crisis (suicidal ideations/suicide attempt).  With written consent from the patient, the family member/significant other has been provided the following suicide prevention education, prior to the and/or following the discharge of the patient.  The suicide prevention education provided includes the following:  Suicide risk factors  Suicide prevention and interventions  National Suicide Hotline telephone number  [facility] assessment telephone number  [facility] Emergency Assistance 911  [facility] and/or Residential Mobile Crisis Unit telephone number  Request made of family/significant other to:  Remove weapons (e.g., guns, rifles, knives), all items previously/currently identified as safety concern.    Remove drugs/medications (over-the-counter, prescriptions, illicit drugs), all items previously/currently identified as a safety concern.  The family member/significant other verbalizes understanding of the suicide prevention education information provided.  The family member/significant other agrees to remove the items of safety concern listed above.  Pt's mother reported no concerns about pt's tentative discharge for Friday 05/27/16. Aftercare plan and SPE material reviewed with her and she verbalized understanding of above.   Tim Joerger N Smart LCSW 05/26/2016, 11:00 AM

## 2016-05-27 MED ORDER — DICLOFENAC SODIUM 1 % TD GEL: 2.0000 g | Freq: Four times a day (QID) | TRANSDERMAL | 0 refills | Status: DC

## 2016-05-27 MED ORDER — GABAPENTIN 300 MG PO CAPS: 300.0000 mg | ORAL_CAPSULE | Freq: Three times a day (TID) | ORAL | 0 refills | Status: DC

## 2016-05-27 MED ORDER — TRAZODONE HCL 50 MG PO TABS: 50.0000 mg | ORAL_TABLET | Freq: Every evening | ORAL | 0 refills | Status: DC | PRN

## 2016-05-27 MED ORDER — FLUOXETINE HCL 40 MG PO CAPS: 80.0000 mg | ORAL_CAPSULE | Freq: Every day | ORAL | 0 refills | Status: DC

## 2016-05-27 MED ORDER — NICOTINE 21 MG/24HR TD PT24: 21.0000 mg | MEDICATED_PATCH | Freq: Every day | TRANSDERMAL | 0 refills | Status: DC

## 2016-05-27 MED ORDER — HYDROXYZINE HCL 25 MG PO TABS: 25.0000 mg | ORAL_TABLET | Freq: Four times a day (QID) | ORAL | 0 refills | Status: DC | PRN

## 2016-05-27 NOTE — Plan of Care (Signed)
Problem: Safety: Goal: Periods of time without injury will increase Outcome: Progressing Client is safe on the unit, AEB q9315min safety check. Client denies suicidal ideations.

## 2016-05-27 NOTE — Progress Notes (Signed)
Recreation Therapy Notes  Date: 05/27/16 Time: 930 Location: 300 Hall Group Room  Group Topic: Stress Management  Goal Area(s) Addresses:  Patient will verbalize importance of using healthy stress management.  Patient will identify positive emotions associated with healthy stress management.   Intervention: Guided Imagery Script  Activity :  Peaceful Place Script.  LRT introduced the technique of guided imagery to patients.  Patients were to follow along as LRT read script so they could participate in activity.  Education:  Stress Management, Discharge Planning.   Education Outcome: Needs additional education  Clinical Observations/Feedback: Pt did not attend group.    Tamya Denardo, LRT/CTRS  

## 2016-05-27 NOTE — [facility] Suicide Risk Assessment (Signed)
[facility]   Principal Problem: MDD (major depressive disorder), recurrent severe, without psychosis (HCC) Discharge Diagnoses:  Patient Active Problem List   Diagnosis Date Noted  . MDD (major depressive disorder), recurrent severe, without psychosis (HCC) [F33.2] 05/23/2016  . Hepatitis C [B19.20] 05/23/2016  . Alcohol use disorder, severe, dependence (HCC) [F10.20] 11/05/2015  . 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 GAD (generalized anxiety disorder) [F41.1] 11/05/2015  . Cellulitis of right lower extremity [L03.115] 03/29/2015    Total Time spent with patient: 30 minutes  Musculoskeletal: Strength & Muscle Tone: within normal limits Gait & Station: normal Patient leans: N/A  Psychiatric Specialty Exam: ROS denies headache, no nausea, no vomiting, no rash   Blood pressure 99/74, pulse 72, temperature 97.7 F (36.5 C), temperature source Oral, resp. rate 16, height 6\' 2"  (1.88 m), weight 181 lb (82.1 kg), SpO2 99 %.Body mass index is 23.24 kg/m.  General Appearance: improved grooming   Eye Contact::  Good  Speech:  Normal Rate409  Volume:  Normal  Mood:  improved mood , currently euthymic   Affect:  Appropriate and more reactive   Thought Process:  Linear  Orientation:  Other:  fully alert and attentive   Thought Content:  linear   Suicidal Thoughts:  No- denies any suicidal ideations ,no self injurious ideations   Homicidal Thoughts:  No denies any violent or homicidal ideations   Memory:  recent and remote grossly intact   Judgement:  Other:  improved   Insight:  improved   Psychomotor Activity:  Normal  Concentration:  Good  Recall:  Good  Fund of Knowledge:Good  Language: Good  Akathisia:  Negative  Handed:  Right  AIMS (if indicated):     Assets:  Desire for Improvement Resilience  Sleep:  Number of Hours: 6.5  Cognition: WNL  ADL's:   Improved    Mental Status Per Nursing Assessment::   On  Admission:     Demographic Factors:  60 year old man, single, no children,  currently lives with mother   Loss Factors: 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 Denies any lingering alcohol WDL , denies any cravings at this time Tolerating medications well, denies side effects   Cognitive Features That Contribute To Risk:  No gross cognitive deficits noted upon discharge. Is alert , attentive, and oriented x 3   Suicide Risk:  Mild:  Suicidal ideation of limited frequency, intensity, duration, and specificity.  There are no identifiable plans, no associated intent, mild dysphoria and related symptoms, good self-control (both objective and subjective assessment), few other risk factors, and identifiable protective factors, including available and accessible social support.  Follow-up Information    [facility] .   Specialty:  Behavioral Health Why:  Social worker [facility] who advised that you come through the [facility] (hours 8am-9am Monday through Friday) within 2-3 days of your discharge from the hospital. Your current appt is booked out to October. Thank you!  Contact information: [address] [place] [address] [phone]  Plan Of Care/Follow-up recommendations:  Activity:  as tolerated  Diet:  Regular  Tests:  NA Other:  See below  Patient leaving unit in good spirits  Plans to return home- lives with mother  States brother will be picking him up later today Plans to go to [employer]  regularly  Nehemiah Massed, MD 05/27/2016, 10:42 AM

## 2016-05-27 NOTE — Progress Notes (Signed)
  [facility] :  Will you be returning to the same living situation after discharge:  Yes,  patient plans to return home At discharge, do you have transportation home?: Yes,  family will transport Do you have the ability to pay for your medications: Yes,  patient will be provided with prescriptions at discharge  Release of information consent forms completed and in the chart;  Patient's signature needed at discharge.  Patient to Follow up at: Follow-up Information    [facility] .   Specialty:  Behavioral Health Why:  Social worker spoke with [facility]uler [facility] who advised that you come through the [facility] (hours 8am-9am Monday through Friday) within 2-3 days of your discharge from the hospital. Your current appt is booked out to October. Thank you!  Contact information: [address] [place] [address] [phone]           Next level of care provider has access to [facility] Link:no  Safety Planning and Suicide Prevention discussed: Yes,  with patient and mother  Have you used any form of tobacco in the last 30 days? (Cigarettes, Smokeless Tobacco, Cigars, and/or Pipes): Yes  Has patient been referred to the Quitline?: Patient refused referral  Patient has been referred for addiction treatment: Yes  Geralynn Capri L Ritvik Mczeal 05/27/2016, 9:49 AM

## 2016-05-27 NOTE — Progress Notes (Signed)
Patient appropriate, polite this shift.  Spent free time in day area, attended groups.  Knee pain well managed with Voltaren gel per patient.  Reviewed findings from assessment with treatment team. Received discharge orders in afternoon.  Reviewed medications, discharge instructions, and follow up appointments with patient .  Patient denied SI, HI, and AVH.  Agrees to contact someone or 911 if she has thoughts/intent to harm self or others.    Medication samples given to patient scripts given to patient.  Paperwork, AVS, SRA, and transition record handed to patient.   Escorted off of unit at 1352. Belongings returned per belongings form.  Discharged to lobby where patient stated ride was awaiting him.  To follow up per AVS.

## 2016-05-27 NOTE — [facility] Group Notes (Signed)
[facility] LCSW Aftercare Discharge Planning Group Note  05/27/2016  8:45 AM  Participation Quality: Did Not Attend. Patient invited to participate but declined.  Furkan Keenum, MSW, LCSW Clinical Social Worker [facility] [phone]   
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
## 2016-05-27 NOTE — Discharge Summary (Signed)
Physician Discharge Summary Note  Patient:  Tim [name] is an 60 y.o., male MRN:  [id number] DOB:  [birth year] Patient phone:  [phone] (home)  Patient address:   [address] [place] [address],  Total Time spent with patient: 30 minutes  Date of Admission:  05/21/2016 Date of Discharge: 05/27/2016  Reason for Admission:   Alcohol abuse  Principal Problem: MDD (major depressive disorder), recurrent severe, without psychosis [facility]) Discharge Diagnoses: Patient Active Problem List   Diagnosis Date Noted  . MDD (major depressive disorder), recurrent severe, without psychosis (HCC) [F33.2] 05/23/2016  . Hepatitis C [B19.20] 05/23/2016  . Alcohol use disorder, severe, dependence (HCC) [F10.20] 11/05/2015  . 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 GAD (generalized anxiety disorder) [F41.1] 11/05/2015  . Cellulitis of right lower extremity [L03.115] 03/29/2015    Past Psychiatric History: see HPI  Past Medical History:  Past Medical History:  Diagnosis Date  . Alcoholism (HCC)   . Anxiety   . COPD (chronic obstructive pulmonary disease) (HCC)   . Depression   . Hep C w/o coma, chronic (HCC)   . Mental health disorder   . Scoliosis   . Thyroid disease     Past Surgical History:  Procedure Laterality Date  . SKIN CANCER EXCISION    . THYROID SURGERY     Family History:  Family History  Problem Relation Age of Onset  . Dementia Mother    Family Psychiatric  History: see HPI Social History:  History  Alcohol Use  . Yes    Comment: drank 1/5 today.     History  Drug Use  . Types: Cocaine, Marijuana    Comment: also states will take pain pills every now and then, last use of cocaine and ETOH today at 1400    Social History   Social History  . Marital status: Single    Spouse name: N/A  . Number of children: N/A  . Years of education: N/A   Social History Main Topics  . Smoking status: Current Every Day Smoker     Packs/day: 0.50    Years: 40.00    Types: Cigarettes  . Smokeless tobacco: Never Used  . Alcohol use Yes     Comment: drank 1/5 today.  . Drug use:     Types: Cocaine, Marijuana     Comment: also states will take pain pills every now and then, last use of cocaine and ETOH today at 1400  . Sexual activity: No   Other Topics Concern  . None   Social History Narrative   ** Merged History Encounter **        Hospital Course:  Tim [name] was admitted to the [facility] adult unit from the [facility] with a BAL of 141 & UDS positive for Benzodiazepine.  He presented with complaints of SI in the last 2 weeks.    Tim [name] was admitted for MDD (major depressive disorder), recurrent severe, without psychosis (HCC) and crisis management.  He was treated with meds and their indications listed below.  Medical problems were identified and treated as needed.  Home medications were restarted as appropriate.  Improvement was monitored by observation and Tim [name] daily report of symptom reduction.  Emotional and mental status was monitored by daily self inventory reports completed by Tim [name] and clinical staff.  Patient reported continued improvement, denied any new concerns.  Patient had been compliant on medications and denied side effects.  Support and encouragement was provided.    Patient encouraged to attend groups to help with recognizing triggers of emotional crises and de-stabilizations.  Patient encouraged to attend group to help identify the positive things in life that would help in dealing with feelings of loss, depression and unhealthy or abusive tendencies.         Tim [name] was evaluated by the treatment team for stability and plans for continued recovery upon discharge.  He was offered further treatment options upon discharge including Residential, Intensive Outpatient and Outpatient treatment. He will follow up with agencies listed below for medication management  and counseling.  Encouraged patient to maintain satisfactory support network and home environment.  Advised to adhere to medication compliance and outpatient treatment follow up.  Prescriptions provided.       Tim [name] motivation was an integral factor for scheduling further treatment.  Employment, transportation, bed availability, health status, family support, and any pending legal issues were also considered during his hospital stay.  Upon completion of this admission the patient was both mentally and medically stable for discharge denying suicidal/homicidal ideation, auditory/visual/tactile hallucinations, delusional thoughts and paranoia.      Physical Findings: AIMS: Facial and Oral Movements Muscles of Facial Expression: None, normal Lips and Perioral Area: None, normal Jaw: None, normal Tongue: None, normal,Extremity Movements Upper (arms, wrists, hands, fingers): None, normal Lower (legs, knees, ankles, toes): None, normal, Trunk Movements Neck, shoulders, hips: None, normal, Overall Severity Severity of abnormal movements (highest score from questions above): None, normal Incapacitation due to abnormal movements: None, normal Patient's awareness of abnormal movements (rate only patient's report): No Awareness, Dental Status Current problems with teeth and/or dentures?: No Does patient usually wear dentures?: No  CIWA:  CIWA-Ar Total: 0 COWS:  COWS Total Score: 1  Musculoskeletal: Strength & Muscle Tone: within normal limits Gait & Station: normal Patient leans: N/A  Psychiatric Specialty Exam:  SEE MD SRA Physical Exam  Nursing note and vitals reviewed. Constitutional: He appears well-developed.    Review of Systems  Constitutional: Negative.  Negative for fever.  HENT: Negative.   Eyes: Negative.  Negative for blurred vision.  Respiratory: Negative.  Negative for cough.   Cardiovascular: Negative.  Negative for chest pain.  Gastrointestinal: Negative.  Negative  for heartburn.  Genitourinary: Negative.  Negative for dysuria.  Musculoskeletal: Negative.  Negative for myalgias.  Skin: Negative.  Negative for rash.  Neurological: Negative.  Negative for dizziness and headaches.  Endo/Heme/Allergies: Negative.  Does not bruise/bleed easily.  Psychiatric/Behavioral: Negative.  Negative for depression.  All other systems reviewed and are negative.   Blood pressure 99/74, pulse 72, temperature 97.7 F (36.5 C), temperature source Oral, resp. rate 16, height  (1.88 m), weight 82.1 kg (181 lb), SpO2 99 %.Body mass index is 23.24 kg/m.    Have you used any form of tobacco in the last 30 days? (Cigarettes, Smokeless Tobacco, Cigars, and/or Pipes): Yes  Has this patient used any form of tobacco in the last 30 days? (Cigarettes, Smokeless Tobacco, Cigars, and/or Pipes) Yes, N/A  Blood Alcohol level:  Lab Results  Component Value Date   ETH 141 (H) 05/21/2016   ETH 173 (H) 05/04/2016    Metabolic Disorder Labs:  No results found for: HGBA1C, MPG No results found for: PROLACTIN No results found for: CHOL, TRIG, HDL, CHOLHDL, VLDL, LDLCALC  See Psychiatric Specialty Exam and Suicide Risk Assessment completed by Attending Physician prior to discharge.  Discharge destination:  Home  Is  patient on multiple antipsychotic therapies at discharge:  No   Has Patient had three or more failed trials of antipsychotic monotherapy by history:  No  Recommended Plan for Multiple Antipsychotic Therapies: NA     Medication List    STOP taking these medications   acamprosate 333 MG tablet Commonly known as:  CAMPRAL   chlordiazePOXIDE 25 MG capsule Commonly known as:  LIBRIUM   FLUoxetine 20 MG capsule Commonly known as:  PROZAC   ondansetron 4 MG disintegrating tablet Commonly known as:  ZOFRAN ODT     TAKE these medications     Indication  diclofenac sodium 1 % Gel Commonly known as:  VOLTAREN Apply 2 g topically 4 (four) times daily.   Indication:  Joint Damage causing Pain and Loss of Function   gabapentin 300 MG capsule Commonly known as:  NEURONTIN Take 1 capsule (300 mg total) by mouth 3 (three) times daily. What changed:  medication strength  how much to take  additional instructions  Indication:  Agitation, Neuropathic Pain, Anxiety symptoms   hydrOXYzine 25 MG tablet Commonly known as:  ATARAX/VISTARIL Take 1 tablet (25 mg total) by mouth every 6 (six) hours as needed for anxiety (or CIWA score </= 10). What changed:  reasons to take this  Indication:  Anxiety Neurosis   nicotine 21 mg/24hr patch Commonly known as:  NICODERM CQ - dosed in mg/24 hours Place 1 patch (21 mg total) onto the skin daily.  Indication:  Nicotine Addiction   traZODone 50 MG tablet Commonly known as:  DESYREL Take 1 tablet (50 mg total) by mouth at bedtime as needed for sleep. What changed:  when to take this  reasons to take this  additional instructions  Indication:  Trouble Sleeping      Follow-up Information    [facility]   Specialty:  Behavioral Health Why:  Social worker spoke with scheduler [facility] who advised that you come through the [facility] Clinic (hours 8am-9am Monday through Friday) within 2-3 days of your discharge from the hospital. Your current appt is booked out to October. Thank you!  Contact informationElpidio Eric: [address] [place] [address] [phone]           Follow-up recommendations:  Activity:  as tol Diet:  as tol  Comments:  1.  Take all your medications as prescribed.   2.  Report any adverse side effects to outpatient provider. 3.  Patient instructed to not use alcohol or illegal drugs while on prescription medicines. 4.  In the event of worsening symptoms, instructed patient to call 911, the crisis hotline or go to nearest emergency room for evaluation of symptoms.  Signed: Lindwood QuaSheila May Agustin, NP [facility] 05/27/2016, 1:16 PM

## 2016-05-27 NOTE — Progress Notes (Signed)
Patient attended Karaoke group, but did not participate.

## 2016-06-11 ENCOUNTER — Emergency Department (HOSPITAL_COMMUNITY): Payer: Self-pay

## 2016-06-11 ENCOUNTER — Emergency Department (HOSPITAL_COMMUNITY)
Admission: EM | Admit: 2016-06-11 | Discharge: 2016-06-11 | Disposition: A | Discharge status: Home / Self Care | Payer: Self-pay | Attending: Emergency Medicine | Admitting: Emergency Medicine

## 2016-06-11 ENCOUNTER — Encounter (HOSPITAL_COMMUNITY): Payer: Self-pay | Admitting: Emergency Medicine

## 2016-06-11 DIAGNOSIS — M25561 Pain in right knee: Secondary | ICD-10-CM | POA: Insufficient documentation

## 2016-06-11 DIAGNOSIS — J449 Chronic obstructive pulmonary disease, unspecified: Secondary | ICD-10-CM | POA: Insufficient documentation

## 2016-06-11 DIAGNOSIS — F17210 Nicotine dependence, cigarettes, uncomplicated: Secondary | ICD-10-CM | POA: Insufficient documentation

## 2016-06-11 DIAGNOSIS — Z79899 Other long term (current) drug therapy: Secondary | ICD-10-CM | POA: Insufficient documentation

## 2016-06-11 DIAGNOSIS — F1020 Alcohol dependence, uncomplicated: Secondary | ICD-10-CM | POA: Insufficient documentation

## 2016-06-11 DIAGNOSIS — R52 Pain, unspecified: Secondary | ICD-10-CM

## 2016-06-11 MED ORDER — NAPROXEN 375 MG PO TABS: 375.0000 mg | ORAL_TABLET | Freq: Two times a day (BID) | ORAL | 0 refills | Status: DC

## 2016-06-11 MED ORDER — MUPIROCIN CALCIUM 2 % EX CREA: TOPICAL_CREAM | Freq: Once | CUTANEOUS | Status: DC

## 2016-06-11 NOTE — ED Triage Notes (Signed)
Pt requesting detox from alcohol. Pt just discharged from fast track with knee pain and alcohol intoxication.

## 2016-06-11 NOTE — ED Notes (Signed)
Pt able to ambulate in hallway. Ace bandage applied to R knee per Arthor CaptainAbigail Harris, PA

## 2016-06-11 NOTE — ED Triage Notes (Signed)
Pt states "ive had a lot to drink".

## 2016-06-11 NOTE — ED Provider Notes (Signed)
[facility]-EMER[facility]ENCY DEPT Provider Note   CSN: [id number] Arrival date & time: 06/11/16  2106  By signing my name below, I, Alyssa Grove, attest that this documentation has been prepared under the direction and in the presence of Arthor Captain, PA-C. Electronically Signed: Alyssa Grove, ED Scribe. 06/11/16. 11:10 PM.  History   Chief Complaint Chief Complaint  Patient presents with  . Knee Pain    The history is provided by the patient. No language interpreter was used.    HPI Comments: Tim [name] is a 60 y.o. male with PMHx of Alcoholism who presents to the Emergency Department complaining of right knee pain onset 3 weeks ago. Pt states he stepped off a high porch that he thought was lower and twisted his knee. Pt is ambulatory. Pt states he cannot sit with his knee bent and has trouble standing up. Pt states he has worked [employer] for his whole life and has had arthritis in his hands.   Past Medical History:  Diagnosis Date  . Alcoholism (HCC)   . Anxiety   . COPD (chronic obstructive pulmonary disease) (HCC)   . Depression   . Hep C w/o coma, chronic (HCC)   . Mental health disorder   . Scoliosis   . Thyroid disease     Patient Active Problem List   Diagnosis Date Noted  . MDD (major depressive disorder), recurrent severe, without psychosis (HCC) 05/23/2016  . Hepatitis C 05/23/2016  . Alcohol use disorder, severe, dependence (HCC) 11/05/2015  . Depression, major, recurrent, moderate (HCC) 11/05/2015  . GAD (generalized anxiety disorder) 11/05/2015  . Cellulitis of right lower extremity 03/29/2015    Past Surgical History:  Procedure Laterality Date  . SKIN CANCER EXCISION    . THYROID SURGERY         Home Medications    Prior to Admission medications   Medication Sig Start Date End Date Taking? Authorizing Provider  diclofenac sodium (VOLTAREN) 1 % GEL Apply 2 g topically 4 (four) times daily. 05/27/16   Adonis Brook, NP  FLUoxetine (PROZAC) 40 MG  capsule Take 2 capsules (80 mg total) by mouth daily. 05/27/16   Adonis Brook, NP  gabapentin (NEURONTIN) 300 MG capsule Take 1 capsule (300 mg total) by mouth 3 (three) times daily. 05/27/16   Adonis Brook, NP  hydrOXYzine (ATARAX/VISTARIL) 25 MG tablet Take 1 tablet (25 mg total) by mouth every 6 (six) hours as needed for anxiety (or CIWA score </= 10). 05/27/16   Adonis Brook, NP  nicotine (NICODERM CQ - DOSED IN MG/24 HOURS) 21 mg/24hr patch Place 1 patch (21 mg total) onto the skin daily. 05/27/16   Adonis Brook, NP  traZODone (DESYREL) 50 MG tablet Take 1 tablet (50 mg total) by mouth at bedtime as needed for sleep. 05/27/16   Adonis Brook, NP    Family History Family History  Problem Relation Age of Onset  . Dementia Mother     Social History Social History  Substance Use Topics  . Smoking status: Current Every Day Smoker    Packs/day: 0.50    Years: 40.00    Types: Cigarettes  . Smokeless tobacco: Never Used  . Alcohol use Yes     Comment: drank 1/5 today.     Allergies   Review of patient's allergies indicates no known allergies.   Review of Systems Review of Systems  Constitutional: Negative for fever.  Musculoskeletal: Positive for arthralgias.    Physical Exam Updated Vital Signs BP 112/56   Pulse Marland Kitchen)  58   Temp 98.1 F (36.7 C) (Oral)   Resp 16   SpO2 98%   Physical Exam  Constitutional: He appears well-developed and well-nourished.  HENT:  Head: Normocephalic.  Eyes: Conjunctivae are normal.  Cardiovascular: Normal rate.   Pulmonary/Chest: Effort normal. No respiratory distress.  Abdominal: He exhibits no distension.  Musculoskeletal: Normal range of motion.  Knee exam: right positive for moderate crepitations, some mild tenderness and pain on range of motion, no effusion is present, no pseudo noted.   Neurological: He is alert.  Skin: Skin is warm and dry.  Psychiatric: He has a normal mood and affect. His behavior is normal.  Nursing note  and vitals reviewed.   ED Treatments / Results  DIAGNOSTIC STUDIES: Oxygen Saturation is 98% on RA, normal by my interpretation.    COORDINATION OF CARE: 11:10 PM Discussed treatment plan with pt at bedside which includes Naproxen and referral to orthopedist and pt agreed to plan.  Labs (all labs ordered are listed, but only abnormal results are displayed) Labs Reviewed - No data to display  EKG  EKG Interpretation None       Radiology Dg Knee Complete 4 Views Right  Result Date: 06/11/2016 CLINICAL DATA:  Right knee pain after twisting fall several weeks prior. EXAM: RIGHT KNEE - COMPLETE 4+ VIEW COMPARISON:  None. FINDINGS: No fracture or dislocation mild tricompartmental osteoarthritis with peripheral spurring and spurring of the tibial spines. Mild medial tibial femoral joint space narrowing. Suspect small joint effusion. Mild anterior soft tissue edema. IMPRESSION: Tricompartmental osteoarthritis with small joint effusion. No acute fracture or dislocation. Electronically Signed   By: Rubye OaksMelanie  Ehinger M.D.   On: 06/11/2016 22:43    Procedures Procedures (including critical care time)  Medications Ordered in ED Medications - No data to display   Initial Impression / Assessment and Plan / ED Course  I have reviewed the triage vital signs and the nursing notes.  Pertinent labs & imaging results that were available during my care of the patient were reviewed by me and considered in my medical decision making (see chart for details).  Clinical Course    Patient intoxicated but able to ambulate in the ED. Patient X-Ray negative for obvious fracture or dislocation. Pain managed in ED. Pt advised to follow up with orthopedics if symptoms persist. Patient given brace while in ED, conservative therapy recommended and discussed. Patient will be dc home & is agreeable with above plan.   Final Clinical Impressions(s) / ED Diagnoses   Final diagnoses:  Pain    New  Prescriptions New Prescriptions   No medications on file    I personally performed the services described in this documentation, which was scribed in my presence. The recorded information has been reviewed and is accurate.       Arthor CaptainAbigail Redina Zeller, PA-C 06/12/16 0012    Arby BarretteMarcy Pfeiffer, MD 06/12/16 [phone]

## 2016-06-11 NOTE — Progress Notes (Signed)
Orthopedic Tech Progress Note Patient Details:  Tim LandauJeffrey L [name] 09/07/1956 [id number]  Ortho Devices Type of Ortho Device: Knee Sleeve Ortho Device/Splint Location: RLE Ortho Device/Splint Interventions: Ordered, Application   Jennye MoccasinHughes, Elieser Tetrick Craig 06/11/2016, 11:46 PM

## 2016-06-11 NOTE — ED Triage Notes (Signed)
Per [facility], c/o R knee pain, was walking down the road and just "tore his knee up" c/o pain for a couple of weeks. Pt has heavy ETOH on board and is red in the face. Pt wanted to get his knee checked out.

## 2016-06-12 ENCOUNTER — Encounter (HOSPITAL_COMMUNITY): Payer: Self-pay | Admitting: *Deleted

## 2016-06-12 ENCOUNTER — Emergency Department (HOSPITAL_COMMUNITY)
Admission: EM | Admit: 2016-06-12 | Discharge: 2016-06-12 | Disposition: A | Discharge status: Home / Self Care | Payer: Self-pay | Attending: Physician Assistant | Admitting: Physician Assistant

## 2016-06-12 DIAGNOSIS — F1020 Alcohol dependence, uncomplicated: Secondary | ICD-10-CM

## 2016-06-12 LAB — COMPREHENSIVE METABOLIC PANEL
ALBUMIN: 4.3 g/dL (ref 3.5–5.0)
ALK PHOS: 71 U/L (ref 38–126)
ALT: 25 U/L (ref 17–63)
AST: 25 U/L (ref 15–41)
Anion gap: 9 (ref 5–15)
BUN: 9 mg/dL (ref 6–20)
CHLORIDE: 103 mmol/L (ref 101–111)
CO2: 25 mmol/L (ref 22–32)
Calcium: 9.9 mg/dL (ref 8.9–10.3)
Creatinine, Ser: 1.07 mg/dL (ref 0.61–1.24)
GFR calc Af Amer: >60 mL/min (ref >60)
GFR calc non Af Amer: >60 mL/min (ref >60)
GLUCOSE: 83 mg/dL (ref 65–99)
Potassium: 3.6 mmol/L (ref 3.5–5.1)
Sodium: 137 mmol/L (ref 135–145)
Total Bilirubin: 0.8 mg/dL (ref 0.3–1.2)
Total Protein: 7.1 g/dL (ref 6.5–8.1)

## 2016-06-12 LAB — CBC
HCT: 46.8 % (ref 39.0–52.0)
HEMOGLOBIN: 15.7 g/dL (ref 13.0–17.0)
MCH: 31 pg (ref 26.0–34.0)
MCHC: 33.5 g/dL (ref 30.0–36.0)
MCV: 92.5 fL (ref 78.0–100.0)
Platelets: 231 10*3/uL (ref 150–400)
RBC: 5.06 MIL/uL (ref 4.22–5.81)
RDW: 13.1 % (ref 11.5–15.5)
WBC: 7.1 10*3/uL (ref 4.0–10.5)

## 2016-06-12 LAB — RAPID URINE DRUG SCREEN, HOSP PERFORMED
AMPHETAMINES: NOT DETECTED
BARBITURATES: NOT DETECTED
Benzodiazepines: POSITIVE — AB
Cocaine: NOT DETECTED
Opiates: NOT DETECTED
TETRAHYDROCANNABINOL: NOT DETECTED

## 2016-06-12 LAB — ETHANOL: Alcohol, Ethyl (B): 206 mg/dL — ABNORMAL HIGH (ref <5)

## 2016-06-12 MED ORDER — SODIUM CHLORIDE 0.9 % IV BOLUS (SEPSIS)
1000.0000 mL | Freq: Once | INTRAVENOUS | Status: AC
  Administered 2016-06-12: 1000 mL via INTRAVENOUS

## 2016-06-12 MED ORDER — THIAMINE HCL 100 MG/ML IJ SOLN
Freq: Once | INTRAVENOUS | Status: AC
  Administered 2016-06-12: 03:00:00 via INTRAVENOUS
  Filled 2016-06-12: qty 1000

## 2016-06-12 NOTE — ED Notes (Signed)
Boluses still in process.

## 2016-06-12 NOTE — ED Provider Notes (Signed)
[facility] Provider Note   CSN: [id number] Arrival date & time: 06/11/16  2351     History   Chief Complaint Chief Complaint  Patient presents with  . Alcohol Problem    HPI Tim [name] is a 60 y.o. male.  Patient presents 30 minutes after discharge from this ED in evaluation of knee pain (chronic), and returns for detox from alcohol. No SI/HI, or hallucinations. He is requesting inpatient detox only.   The history is provided by the patient. No language interpreter was used.  Alcohol Problem  This is a chronic problem.    Past Medical History:  Diagnosis Date  . Alcoholism (HCC)   . Anxiety   . COPD (chronic obstructive pulmonary disease) (HCC)   . Depression   . Hep C w/o coma, chronic (HCC)   . Mental health disorder   . Scoliosis   . Thyroid disease     Patient Active Problem List   Diagnosis Date Noted  . MDD (major depressive disorder), recurrent severe, without psychosis (HCC) 05/23/2016  . Hepatitis C 05/23/2016  . Alcohol use disorder, severe, dependence (HCC) 11/05/2015  . Depression, major, recurrent, moderate (HCC) 11/05/2015  . GAD (generalized anxiety disorder) 11/05/2015  . Cellulitis of right lower extremity 03/29/2015    Past Surgical History:  Procedure Laterality Date  . SKIN CANCER EXCISION    . THYROID SURGERY         Home Medications    Prior to Admission medications   Medication Sig Start Date End Date Taking? Authorizing Provider  diclofenac sodium (VOLTAREN) 1 % GEL Apply 2 g topically 4 (four) times daily. 05/27/16   Adonis Brook, NP  FLUoxetine (PROZAC) 40 MG capsule Take 2 capsules (80 mg total) by mouth daily. 05/27/16   Adonis Brook, NP  gabapentin (NEURONTIN) 300 MG capsule Take 1 capsule (300 mg total) by mouth 3 (three) times daily. 05/27/16   Adonis Brook, NP  hydrOXYzine (ATARAX/VISTARIL) 25 MG tablet Take 1 tablet (25 mg total) by mouth every 6 (six) hours as needed for anxiety (or CIWA score </= 10).  05/27/16   Adonis Brook, NP  naproxen (NAPROSYN) 375 MG tablet Take 1 tablet (375 mg total) by mouth 2 (two) times daily. 06/11/16   Arthor Captain, PA-C  nicotine (NICODERM CQ - DOSED IN MG/24 HOURS) 21 mg/24hr patch Place 1 patch (21 mg total) onto the skin daily. 05/27/16   Adonis Brook, NP  traZODone (DESYREL) 50 MG tablet Take 1 tablet (50 mg total) by mouth at bedtime as needed for sleep. 05/27/16   Adonis Brook, NP    Family History Family History  Problem Relation Age of Onset  . Dementia Mother     Social History Social History  Substance Use Topics  . Smoking status: Current Every Day Smoker    Packs/day: 0.50    Years: 40.00    Types: Cigarettes  . Smokeless tobacco: Never Used  . Alcohol use Yes     Comment: drank 1/5 today.     Allergies   Review of patient's allergies indicates no known allergies.   Review of Systems Review of Systems  Constitutional: Negative for chills and fever.  HENT: Negative.   Respiratory: Negative.   Cardiovascular: Negative.   Gastrointestinal: Negative.   Musculoskeletal:       Complains of chronic knee pain.  Skin: Negative.   Neurological: Negative.   Psychiatric/Behavioral: Negative for dysphoric mood, hallucinations and suicidal ideas.     Physical Exam Updated Vital  Signs BP 102/71   Pulse (!) 57   Temp 98.2 F (36.8 C) (Oral)   Resp 17   Ht 6\' 2"  (1.88 m)   Wt 86.2 kg   SpO2 99%   BMI 24.39 kg/m   Physical Exam  Constitutional: He appears well-developed and well-nourished.  Acutely intoxicated but coherent, ambulatory.  HENT:  Head: Normocephalic.  Neck: Normal range of motion. Neck supple.  Cardiovascular: Normal rate and regular rhythm.   Pulmonary/Chest: Effort normal and breath sounds normal.  Abdominal: Soft. Bowel sounds are normal. There is no tenderness. There is no rebound and no guarding.  Musculoskeletal: Normal range of motion.  Neurological: He is alert. No cranial nerve deficit.  Skin:  Skin is warm and dry. No rash noted.     ED Treatments / Results  Labs (all labs ordered are listed, but only abnormal results are displayed) Labs Reviewed  ETHANOL - Abnormal; Notable for the following:       Result Value   Alcohol, Ethyl (B) 206 (*)    All other components within normal limits  URINE RAPID DRUG SCREEN, HOSP PERFORMED - Abnormal; Notable for the following:    Benzodiazepines POSITIVE (*)    All other components within normal limits  COMPREHENSIVE METABOLIC PANEL  CBC    EKG  EKG Interpretation None       Radiology Dg Knee Complete 4 Views Right  Result Date: 06/11/2016 CLINICAL DATA:  Right knee pain after twisting fall several weeks prior. EXAM: RIGHT KNEE - COMPLETE 4+ VIEW COMPARISON:  None. FINDINGS: No fracture or dislocation mild tricompartmental osteoarthritis with peripheral spurring and spurring of the tibial spines. Mild medial tibial femoral joint space narrowing. Suspect small joint effusion. Mild anterior soft tissue edema. IMPRESSION: Tricompartmental osteoarthritis with small joint effusion. No acute fracture or dislocation. Electronically Signed   By: Rubye OaksMelanie  Ehinger M.D.   On: 06/11/2016 22:43    Procedures Procedures (including critical care time)  Medications Ordered in ED Medications  sodium chloride 0.9 % bolus 1,000 mL (not administered)  sodium chloride 0.9 % 1,000 mL with thiamine 100 mg, folic acid 1 mg, multivitamins adult 10 mL infusion (not administered)  sodium chloride 0.9 % bolus 1,000 mL (1,000 mLs Intravenous New Bag/Given 06/12/16 0114)     Initial Impression / Assessment and Plan / ED Course  I have reviewed the triage vital signs and the nursing notes.  Pertinent labs & imaging results that were available during my care of the patient were reviewed by me and considered in my medical decision making (see chart for details).  Clinical Course    Patient well known to the ED for alcohol dependence presents with  request for detox placement. No SI/HI or hallucinations. Resources will be provided for residential as well as outpatient facilities. He was found to be hypotensive, IVF's provided along with a banana bag for supplementation.  Final Clinical Impressions(s) / ED Diagnoses   Final diagnoses:  None   1. Chronic alcohol intoxication  New Prescriptions New Prescriptions   No medications on file     Elpidio AnisShari Amariyana Heacox, PA-C 06/12/16 [phone]    Courteney Randall AnLyn Mackuen, MD 06/12/16 [phone]

## 2016-07-07 ENCOUNTER — Encounter (HOSPITAL_COMMUNITY): Payer: Self-pay | Admitting: Emergency Medicine

## 2016-07-07 ENCOUNTER — Emergency Department (HOSPITAL_COMMUNITY)
Admission: EM | Admit: 2016-07-07 | Discharge: 2016-07-08 | Disposition: A | Discharge status: Home / Self Care | Payer: Self-pay | Attending: Emergency Medicine | Admitting: Emergency Medicine

## 2016-07-07 DIAGNOSIS — J449 Chronic obstructive pulmonary disease, unspecified: Secondary | ICD-10-CM | POA: Insufficient documentation

## 2016-07-07 DIAGNOSIS — F17210 Nicotine dependence, cigarettes, uncomplicated: Secondary | ICD-10-CM | POA: Insufficient documentation

## 2016-07-07 DIAGNOSIS — Z79899 Other long term (current) drug therapy: Secondary | ICD-10-CM | POA: Insufficient documentation

## 2016-07-07 DIAGNOSIS — F10129 Alcohol abuse with intoxication, unspecified: Secondary | ICD-10-CM | POA: Insufficient documentation

## 2016-07-07 DIAGNOSIS — F1010 Alcohol abuse, uncomplicated: Secondary | ICD-10-CM

## 2016-07-07 MED ORDER — CHLORDIAZEPOXIDE HCL 25 MG PO CAPS: ORAL_CAPSULE | ORAL | 0 refills | Status: DC

## 2016-07-07 NOTE — ED Triage Notes (Addendum)
Pt from home requesting help detoxing from ETOH. Pt states he has had 4 40's this evening.  Pt ambulates without assistance. Pt is calm and cooperative at time of assessment. Pt denies HI, SI, AVH  Pt states he needs detox to get away from his enablers.

## 2016-07-07 NOTE — ED Notes (Signed)
Bed: WTR6 Expected date:  Expected time:  Means of arrival:  Comments: 

## 2016-07-08 ENCOUNTER — Encounter (HOSPITAL_COMMUNITY): Payer: Self-pay | Admitting: *Deleted

## 2016-07-08 ENCOUNTER — Emergency Department (HOSPITAL_COMMUNITY)
Admission: EM | Admit: 2016-07-08 | Discharge: 2016-07-08 | Disposition: A | Discharge status: Home / Self Care | Payer: Self-pay | Attending: Emergency Medicine | Admitting: Emergency Medicine

## 2016-07-08 ENCOUNTER — Emergency Department (HOSPITAL_COMMUNITY): Admission: EM | Admit: 2016-07-08 | Discharge: 2016-07-08 | Discharge status: Absent without leave | Payer: Self-pay

## 2016-07-08 ENCOUNTER — Observation Stay (HOSPITAL_COMMUNITY)
Admission: RE | Admit: 2016-07-08 | Discharge: 2016-07-09 | Disposition: A | Discharge status: Home / Self Care | Payer: Federal, State, Local not specified - Other | Attending: Psychiatry | Admitting: Psychiatry

## 2016-07-08 ENCOUNTER — Observation Stay (HOSPITAL_COMMUNITY)
Admission: AD | Admit: 2016-07-08 | Payer: Federal, State, Local not specified - Other | Source: Intra-hospital | Admitting: Psychiatry

## 2016-07-08 DIAGNOSIS — J449 Chronic obstructive pulmonary disease, unspecified: Secondary | ICD-10-CM | POA: Insufficient documentation

## 2016-07-08 DIAGNOSIS — F332 Major depressive disorder, recurrent severe without psychotic features: Secondary | ICD-10-CM

## 2016-07-08 DIAGNOSIS — F1290 Cannabis use, unspecified, uncomplicated: Secondary | ICD-10-CM | POA: Insufficient documentation

## 2016-07-08 DIAGNOSIS — B182 Chronic viral hepatitis C: Secondary | ICD-10-CM | POA: Insufficient documentation

## 2016-07-08 DIAGNOSIS — M419 Scoliosis, unspecified: Secondary | ICD-10-CM | POA: Insufficient documentation

## 2016-07-08 DIAGNOSIS — F1010 Alcohol abuse, uncomplicated: Secondary | ICD-10-CM | POA: Insufficient documentation

## 2016-07-08 DIAGNOSIS — Z85828 Personal history of other malignant neoplasm of skin: Secondary | ICD-10-CM | POA: Insufficient documentation

## 2016-07-08 DIAGNOSIS — F1020 Alcohol dependence, uncomplicated: Secondary | ICD-10-CM | POA: Insufficient documentation

## 2016-07-08 DIAGNOSIS — F10220 Alcohol dependence with intoxication, uncomplicated: Secondary | ICD-10-CM | POA: Diagnosis present

## 2016-07-08 DIAGNOSIS — F17210 Nicotine dependence, cigarettes, uncomplicated: Secondary | ICD-10-CM | POA: Insufficient documentation

## 2016-07-08 DIAGNOSIS — Z79899 Other long term (current) drug therapy: Secondary | ICD-10-CM | POA: Insufficient documentation

## 2016-07-08 DIAGNOSIS — F1490 Cocaine use, unspecified, uncomplicated: Secondary | ICD-10-CM | POA: Insufficient documentation

## 2016-07-08 DIAGNOSIS — F411 Generalized anxiety disorder: Secondary | ICD-10-CM | POA: Insufficient documentation

## 2016-07-08 HISTORY — DX: Personal history of other diseases of the digestive system: Z87.19

## 2016-07-08 LAB — CBC
HEMATOCRIT: 42.9 % (ref 39.0–52.0)
HEMOGLOBIN: 14.9 g/dL (ref 13.0–17.0)
MCH: 31.4 pg (ref 26.0–34.0)
MCHC: 34.7 g/dL (ref 30.0–36.0)
MCV: 90.3 fL (ref 78.0–100.0)
Platelets: 206 10*3/uL (ref 150–400)
RBC: 4.75 MIL/uL (ref 4.22–5.81)
RDW: 13.1 % (ref 11.5–15.5)
WBC: 6.8 10*3/uL (ref 4.0–10.5)

## 2016-07-08 LAB — COMPREHENSIVE METABOLIC PANEL
ALK PHOS: 56 U/L (ref 38–126)
ALT: 19 U/L (ref 17–63)
AST: 21 U/L (ref 15–41)
Albumin: 4.2 g/dL (ref 3.5–5.0)
Anion gap: 11 (ref 5–15)
BUN: 13 mg/dL (ref 6–20)
CALCIUM: 9.8 mg/dL (ref 8.9–10.3)
CO2: 21 mmol/L — AB (ref 22–32)
CREATININE: 1.03 mg/dL (ref 0.61–1.24)
Chloride: 108 mmol/L (ref 101–111)
GFR calc non Af Amer: >60 mL/min (ref >60)
GLUCOSE: 88 mg/dL (ref 65–99)
Potassium: 3.3 mmol/L — ABNORMAL LOW (ref 3.5–5.1)
SODIUM: 140 mmol/L (ref 135–145)
Total Bilirubin: 0.6 mg/dL (ref 0.3–1.2)
Total Protein: 6.7 g/dL (ref 6.5–8.1)

## 2016-07-08 LAB — RAPID URINE DRUG SCREEN, HOSP PERFORMED
Amphetamines: NOT DETECTED
BARBITURATES: NOT DETECTED
Benzodiazepines: POSITIVE — AB
Cocaine: NOT DETECTED
Opiates: NOT DETECTED
TETRAHYDROCANNABINOL: NOT DETECTED

## 2016-07-08 LAB — SALICYLATE LEVEL: Salicylate Lvl: <4 mg/dL (ref 2.8–30.0)

## 2016-07-08 LAB — ACETAMINOPHEN LEVEL: Acetaminophen (Tylenol), Serum: <10 ug/mL — ABNORMAL LOW (ref 10–30)

## 2016-07-08 LAB — ETHANOL: Alcohol, Ethyl (B): 81 mg/dL — ABNORMAL HIGH (ref <5)

## 2016-07-08 MED ORDER — POTASSIUM CHLORIDE CRYS ER 20 MEQ PO TBCR: 40.0000 meq | EXTENDED_RELEASE_TABLET | Freq: Once | ORAL | Status: DC

## 2016-07-08 MED ORDER — MAGNESIUM HYDROXIDE 400 MG/5ML PO SUSP: 30.0000 mL | Freq: Every day | ORAL | Status: DC | PRN

## 2016-07-08 MED ORDER — POTASSIUM CHLORIDE CRYS ER 20 MEQ PO TBCR
40.0000 meq | EXTENDED_RELEASE_TABLET | Freq: Once | ORAL | Status: AC
  Administered 2016-07-08: 40 meq via ORAL
  Filled 2016-07-08: qty 2

## 2016-07-08 MED ORDER — LORAZEPAM 1 MG PO TABS: 0.0000 mg | ORAL_TABLET | Freq: Two times a day (BID) | ORAL | Status: DC

## 2016-07-08 MED ORDER — VITAMIN B-1 100 MG PO TABS: 100.0000 mg | ORAL_TABLET | Freq: Every day | ORAL | Status: DC

## 2016-07-08 MED ORDER — GABAPENTIN 100 MG PO CAPS
100.0000 mg | ORAL_CAPSULE | Freq: Three times a day (TID) | ORAL | Status: DC
  Administered 2016-07-08 – 2016-07-09 (×2): 100 mg via ORAL
  Filled 2016-07-08 (×2): qty 1

## 2016-07-08 MED ORDER — LORAZEPAM 1 MG PO TABS: 0.0000 mg | ORAL_TABLET | Freq: Four times a day (QID) | ORAL | Status: DC

## 2016-07-08 MED ORDER — THIAMINE HCL 100 MG/ML IJ SOLN: 100.0000 mg | Freq: Every day | INTRAMUSCULAR | Status: DC

## 2016-07-08 MED ORDER — TRAZODONE HCL 50 MG PO TABS
50.0000 mg | ORAL_TABLET | Freq: Every evening | ORAL | Status: DC | PRN
  Administered 2016-07-08: 50 mg via ORAL
  Filled 2016-07-08: qty 1

## 2016-07-08 MED ORDER — ALUM & MAG HYDROXIDE-SIMETH 200-200-20 MG/5ML PO SUSP: 30.0000 mL | ORAL | Status: DC | PRN

## 2016-07-08 MED ORDER — ACETAMINOPHEN 325 MG PO TABS: 650.0000 mg | ORAL_TABLET | Freq: Four times a day (QID) | ORAL | Status: DC | PRN

## 2016-07-08 MED ORDER — LORAZEPAM 1 MG PO TABS
1.0000 mg | ORAL_TABLET | Freq: Four times a day (QID) | ORAL | Status: DC | PRN
  Administered 2016-07-08: 1 mg via ORAL
  Filled 2016-07-08: qty 1

## 2016-07-08 NOTE — ED Triage Notes (Signed)
Pt was seen earlier this evening requesting detox from ETOH; pt was evaluated and given resources to follow up with; pt left the ER and [facility] and advised that he wanted to get help with detox and that he was suicidal; pt had denied being suicidal when was in the ER earlier; when pt came back and asked about being suicidal, pt states " I had to say that I am suicidal so that they will keep me"

## 2016-07-08 NOTE — Progress Notes (Signed)
Pt admitted to the Observation Unit requesting help with detox and [place]. Pt reports that he did not have the phone number to call himself. Pt says that he lives in a mobile home in [place] with his 58 yr old mother. He says that his scooter is broke down and he has no transportation except a brother that takes him to his appointments sometimes. Pt currently denies si and hi thoughts. He denies hallucination.

## 2016-07-08 NOTE — H&P (Signed)
Behavioral Health Medical Screening Exam  Tim LandauJeffrey L [name] is an 60 y.o. male who presents seeking inpatient treatment for alcoholism. States that he is tired of letting his family down and wants help to get back on his feet. Lives with his mother and states that he will be able to return to his current living situation.   Total Time spent with patient: 30 minutes  Psychiatric Specialty Exam: Physical Exam  Constitutional: He is oriented to person, place, and time.  HENT:  Head: Normocephalic and atraumatic.  Cardiovascular: Normal rate, regular rhythm and normal heart sounds.  Exam reveals no gallop and no friction rub.   No murmur heard. Respiratory: Effort normal and breath sounds normal.  Musculoskeletal: Normal range of motion. He exhibits no edema.  Neurological: He is alert and oriented to person, place, and time. No cranial nerve deficit. Coordination abnormal.  Skin: Skin is warm and dry. He is not diaphoretic.  Psychiatric: Judgment and thought content normal.    Review of Systems  Constitutional: Negative.   HENT: Negative.   Eyes: Negative.   Respiratory: Negative.   Cardiovascular: Negative.   Gastrointestinal: Negative.   Genitourinary: Negative.   Musculoskeletal: Negative.   Skin: Negative.   Neurological: Negative.   Endo/Heme/Allergies: Negative.   Psychiatric/Behavioral: Positive for depression and substance abuse. Negative for hallucinations, memory loss and suicidal ideas. The patient is nervous/anxious and has insomnia.     Blood pressure 114/73, pulse 64, temperature 97.7 F (36.5 C), temperature source Oral, SpO2 95 %.There is no height or weight on file to calculate BMI.  General Appearance: Disheveled  Eye Contact:  Good  Speech:  Slow and Slurred  Volume:  Normal  Mood:  Depressed  Affect:  Depressed  Thought Process:  Coherent  Orientation:  Full (Time, Place, and Person)  Thought Content:  Logical  Suicidal Thoughts:  No  Homicidal Thoughts:   No  Memory:  Immediate;   Good Recent;   Good Remote;   Good  Judgement:  Good  Insight:  Fair  Psychomotor Activity:  Decreased  Concentration: Concentration: Fair and Attention Span: Poor  Recall:  Good  Fund of Knowledge:Good  Language: Fair  Akathisia:  No  Handed:  Right  AIMS (if indicated):     Assets:  Desire for Improvement Housing  Sleep:       Musculoskeletal: Strength & Muscle Tone: within normal limits Gait & Station: unsteady Patient leans: N/A  Blood pressure 114/73, pulse 64, temperature 97.7 F (36.5 C), temperature source Oral, SpO2 95 %.  Recommendations:  Based on my evaluation the patient appears to have an emergency medical condition for which I recommend the patient be transferred to the emergency department for further evaluation.  Jackelyn PolingJason A Imoni Kohen, NP 07/08/2016, 2:21 AM

## 2016-07-08 NOTE — ED Notes (Signed)
Attempted to call report to the Obs Unit; RN would not take report at this time

## 2016-07-08 NOTE — H&P (Signed)
[facility] Psychiatric Admission Assessment Adult  Patient Identification: Tim [name] MRN:  [id number] Date of Evaluation:  07/08/2016  Chief Complaint: Patient states "A lot of things got me down and then I started drinking again."   Principal Diagnosis: Alcohol use disorder, chronic  Diagnosis:   Patient Active Problem List   Diagnosis Date Noted  . Severe episode of recurrent major depressive disorder, without psychotic features ([place]) [F33.2] 07/08/2016  . MDD (major depressive disorder), recurrent severe, without psychosis ([place]) [F33.2] 05/23/2016  . Hepatitis C [B19.20] 05/23/2016  . Alcohol use disorder, severe, dependence ([place]) [F10.20] 11/05/2015  . Depression, major, recurrent, moderate ([place]) [F33.1] 11/05/2015  . GAD (generalized anxiety disorder) [F41.1] 11/05/2015  . Cellulitis of right lower extremity [L03.115] 03/29/2015   History of Present Illness:    Tim [name] a 60 year old white male that is a walk in at [facility]. He was sent to [facility] for medical clearance prior to being admitted to the [facility]. Patient reported passive SI without a plan.  Patient reports increased depression associated with his inability to stop drinking.  Patient reports that he would be able to contract for safety if he was able to go to a substance abuse facility. Patient reported drinking three forty ounce beers daily.  Patient reported that he has been addicted to alcohol all of his life.    Patient is currently reporting some mild withdrawal symptoms.  Patient reports that he has been to [facility], [facility], [facility], [facility] in the past for substance abuse and depression.  Patient denies a history of seizures.   Patient denies HI and psychosis.  Patient reports that he lives with his mother and he has not worked since 2009 and he just wants to get his life back. The patient was recently discharged from [facility] on 05/21/2016 with follow up at [facility] in place. Patient was discharged at  that time on gabapentin and trazodone. Since being admitted to the [facility] Unit the patient has appeared motivated for treatment to address his alcohol use. The Observation Unit staff have contacted [facility] so far but no beds are available.   Associated Signs/Symptoms: Depression Symptoms:  depressed mood, insomnia, fatigue, anxiety, panic attacks, loss of energy/fatigue,  (Hypo) Manic Symptoms:  Irritable Mood, Labiality of Mood,  Anxiety Symptoms:  Excessive Worry, Panic Symptoms,  Psychotic Symptoms:  denies  PTSD Symptoms: Denies  Total Time spent with patient: 45 minutes  Past Psychiatric History: Alcoholism, chronic.   Risk to Self: Suicidal Ideation: Denies Suicidal Intent: No Is patient at risk for suicide?: No Suicidal Plan?: No Access to Means: No What has been your use of drugs/alcohol within the last 12 months?: Alcohol How many times?: 3 Other Self Harm Risks: Cutting in the past Triggers for Past Attempts: Family contact Intentional Self Injurious Behavior: Cutting (2014 was the last time that he cut himself) Comment - Self Injurious Behavior: None currently  Risk to Others: Homicidal Ideation: No Thoughts of Harm to Others: No Current Homicidal Intent: No Current Homicidal Plan: No Access to Homicidal Means: No Identified Victim: None Reported History of harm to others?: No Assessment of Violence: None Noted Violent Behavior Description: n Does patient have access to weapons?: No Criminal Charges Pending?: No Does patient have a court date: NoNo  Prior Inpatient Therapy: Prior Inpatient Therapy: Yes Prior Therapy Dates: 2017 - Feb  Prior Therapy Facilty/Provider(s): [facility] Reason for Treatment: SI and [facility] August last time [facility]  Prior Outpatient Therapy: Prior Outpatient Therapy:  Yes Prior Therapy Dates: Ongoing Prior Therapy Facilty/Provider(s): [facility] Reason for Treatment: Medication Management Does patient have an ACCT  team?: No Does patient have Intensive In-House Services?  : No Does patient have [facility] services? : No [facility]   Alcohol Screening:    Substance Abuse History in the last 12 months:  Yes.  Alcohol level on admission was 81.   Consequences of Substance Abuse: Legal Consequences:  few DWI's up to 5 years ago Withdrawal Symptoms:   Diaphoresis Diarrhea Nausea Tremors  Previous Psychotropic Medications: Yes, Prozac Neurontin Trazodone  Psychological Evaluations: No   Past Medical History:  Past Medical History:  Diagnosis Date  . Alcoholism ([place])   . Anxiety   . COPD (chronic obstructive pulmonary disease) ([employer])   . Depression   . Hep C w/o coma, chronic ([place])   . History of hiatal hernia    hernia rt groin  . Mental health disorder   . Scoliosis   . Thyroid disease     Past Surgical History:  Procedure Laterality Date  . SKIN CANCER EXCISION    . THYROID SURGERY     Family History:  Family History  Problem Relation Age of Onset  . Dementia Mother    Family Psychiatric  History: Father alcoholic, niece alcohol   Social History:  History  Alcohol Use  . Yes    Comment: drank 1/5 today.     History  Drug Use  . Types: Cocaine, Marijuana    Comment: also states will take pain pills every now and then, last use of cocaine and ETOH today at 1400    Social History   Social History  . Marital status: Single    Spouse name: N/A  . Number of children: N/A  . Years of education: N/A   Social History Main Topics  . Smoking status: Current Every Day Smoker    Packs/day: 0.50    Years: 40.00    Types: Cigarettes  . Smokeless tobacco: Never Used  . Alcohol use Yes     Comment: drank 1/5 today.  . Drug use:     Types: Cocaine, Marijuana     Comment: also states will take pain pills every now and then, last use of cocaine and ETOH today at 1400  . Sexual activity: No   Other Topics Concern  . None   Social History  Narrative   ** Merged History Encounter **       Lives with his mother. Got GED.  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 Single no children in no relationship   Allergies:  No Known Allergies  Lab Results:  Results for orders placed or performed during the hospital encounter of 07/08/16 (from the past 48 hour(s))  Comprehensive metabolic panel     Status: Abnormal   Collection Time: 07/08/16  3:58 AM  Result Value Ref Range   Sodium 140 135 - 145 mmol/L   Potassium 3.3 (L) 3.5 - 5.1 mmol/L   Chloride 108 101 - 111 mmol/L   CO2 21 (L) 22 - 32 mmol/L   Glucose, Bld 88 65 - 99 mg/dL   BUN 13 6 - 20 mg/dL   Creatinine, Ser 1.03 0.61 - 1.24 mg/dL   Calcium 9.8 8.9 - 10.3 mg/dL   Total Protein 6.7 6.5 - 8.1 g/dL   Albumin 4.2 3.5 - 5.0 g/dL   AST 21 15 - 41 U/L   ALT 19 17 - 63 U/L  Alkaline Phosphatase 56 38 - 126 U/L   Total Bilirubin 0.6 0.3 - 1.2 mg/dL   GFR calc non Af Amer >60 >60 mL/min   GFR calc Af Amer >60 >60 mL/min    Comment: (NOTE) The eGFR has been calculated using the CKD EPI equation. This calculation has not been validated in all clinical situations. eGFR's persistently <60 mL/min signify possible Chronic Kidney Disease.    Anion gap 11 5 - 15  Ethanol     Status: Abnormal   Collection Time: 07/08/16  3:58 AM  Result Value Ref Range   Alcohol, Ethyl (B) 81 (H) <5 mg/dL    Comment:        LOWEST DETECTABLE LIMIT FOR SERUM ALCOHOL IS 5 mg/dL FOR MEDICAL PURPOSES ONLY   Salicylate level     Status: None   Collection Time: 07/08/16  3:58 AM  Result Value Ref Range   Salicylate Lvl <1.3 2.8 - 30.0 mg/dL  Acetaminophen level     Status: Abnormal   Collection Time: 07/08/16  3:58 AM  Result Value Ref Range   Acetaminophen (Tylenol), Serum <10 (L) 10 - 30 ug/mL    Comment:        THERAPEUTIC CONCENTRATIONS VARY SIGNIFICANTLY. A RANGE OF 10-30 ug/mL MAY BE AN EFFECTIVE CONCENTRATION FOR MANY PATIENTS. HOWEVER, SOME ARE BEST TREATED AT  CONCENTRATIONS OUTSIDE THIS RANGE. ACETAMINOPHEN CONCENTRATIONS >150 ug/mL AT 4 HOURS AFTER INGESTION AND >50 ug/mL AT 12 HOURS AFTER INGESTION ARE OFTEN ASSOCIATED WITH TOXIC REACTIONS.   cbc     Status: None   Collection Time: 07/08/16  3:58 AM  Result Value Ref Range   WBC 6.8 4.0 - 10.5 K/uL   RBC 4.75 4.22 - 5.81 MIL/uL   Hemoglobin 14.9 13.0 - 17.0 g/dL   HCT 42.9 39.0 - 52.0 %   MCV 90.3 78.0 - 100.0 fL   MCH 31.4 26.0 - 34.0 pg   MCHC 34.7 30.0 - 36.0 g/dL   RDW 13.1 11.5 - 15.5 %   Platelets 206 150 - 400 K/uL  Rapid urine drug screen (hospital performed)     Status: Abnormal   Collection Time: 07/08/16  5:35 AM  Result Value Ref Range   Opiates NONE DETECTED NONE DETECTED   Cocaine NONE DETECTED NONE DETECTED   Benzodiazepines POSITIVE (A) NONE DETECTED   Amphetamines NONE DETECTED NONE DETECTED   Tetrahydrocannabinol NONE DETECTED NONE DETECTED   Barbiturates NONE DETECTED NONE DETECTED    Comment:        DRUG SCREEN FOR MEDICAL PURPOSES ONLY.  IF CONFIRMATION IS NEEDED FOR ANY PURPOSE, NOTIFY LAB WITHIN 5 DAYS.        LOWEST DETECTABLE LIMITS FOR URINE DRUG SCREEN Drug Class       Cutoff (ng/mL) Amphetamine      1000 Barbiturate      200 Benzodiazepine   244 Tricyclics       010 Opiates          300 Cocaine          300 THC              50    Metabolic Disorder Labs:  No results found for: HGBA1C, MPG No results found for: PROLACTIN No results found for: CHOL, TRIG, HDL, CHOLHDL, VLDL, LDLCALC  Current Medications: Current Facility-Administered Medications  Medication Dose Route Frequency Provider Last Rate Last Dose  . acetaminophen (TYLENOL) tablet 650 mg  650 mg Oral Q6H PRN Patrecia Pour, NP      .  alum & mag hydroxide-simeth (MAALOX/MYLANTA) 200-200-20 MG/5ML suspension 30 mL  30 mL Oral Q4H PRN Patrecia Pour, NP      . gabapentin (NEURONTIN) capsule 100 mg  100 mg Oral TID Niel Hummer, NP      . LORazepam (ATIVAN) tablet 1 mg  1 mg Oral  Q6H PRN Niel Hummer, NP      . magnesium hydroxide (MILK OF MAGNESIA) suspension 30 mL  30 mL Oral Daily PRN Patrecia Pour, NP      . traZODone (DESYREL) tablet 50 mg  50 mg Oral QHS PRN Niel Hummer, NP       PTA Medications: Prescriptions Prior to Admission  Medication Sig Dispense Refill Last Dose  . diclofenac sodium (VOLTAREN) 1 % GEL Apply 2 g topically 4 (four) times daily. 1 Tube 0 Past Month at Unknown time  . FLUoxetine (PROZAC) 40 MG capsule Take 2 capsules (80 mg total) by mouth daily. 60 capsule 0 07/07/2016 at Unknown time  . gabapentin (NEURONTIN) 300 MG capsule Take 1 capsule (300 mg total) by mouth 3 (three) times daily. 90 capsule 0 07/07/2016 at Unknown time  . hydrOXYzine (ATARAX/VISTARIL) 25 MG tablet Take 1 tablet (25 mg total) by mouth every 6 (six) hours as needed for anxiety (or CIWA score </= 10). 30 tablet 0 07/07/2016 at Unknown time  . traZODone (DESYREL) 50 MG tablet Take 1 tablet (50 mg total) by mouth at bedtime as needed for sleep. 30 tablet 0 07/07/2016 at Unknown time  . chlordiazePOXIDE (LIBRIUM) 25 MG capsule '50mg'$  PO TID x 1D, then 25-'50mg'$  PO BID X 1D, then 25-'50mg'$  PO QD X 1D (Patient not taking: Reported on 07/08/2016) 10 capsule 0 Not Taking at Unknown time  . naproxen (NAPROSYN) 375 MG tablet Take 1 tablet (375 mg total) by mouth 2 (two) times daily. (Patient not taking: Reported on 07/08/2016) 20 tablet 0 Not Taking at Unknown time  . nicotine (NICODERM CQ - DOSED IN MG/24 HOURS) 21 mg/24hr patch Place 1 patch (21 mg total) onto the skin daily. (Patient not taking: Reported on 07/08/2016) 28 patch 0 Not Taking at Unknown time   Musculoskeletal: Strength & Muscle Tone: within normal limits Gait & Station: normal Patient leans: normal  Psychiatric Specialty Exam: Physical Exam  Genitourinary:  Genitourinary Comments: Denies any issues in this area  Skin: Skin is dry.    Review of Systems  Constitutional: Positive for malaise/fatigue.  HENT: Negative.    Respiratory: Negative.        10 a day has COPD  Cardiovascular: Negative.   Genitourinary: Negative.   Skin: Negative.   Endo/Heme/Allergies: Negative.   Psychiatric/Behavioral: Positive for depression and substance abuse. Negative for hallucinations, memory loss and suicidal ideas. The patient is nervous/anxious and has insomnia.     Blood pressure 115/74, pulse 75, temperature 98.2 F (36.8 C), temperature source Oral, resp. rate 16, height '6\' 3"'$  (1.905 m), weight 82.6 kg (182 lb), SpO2 98 %.Body mass index is 22.75 kg/m.  General Appearance: Fairly Groomed  Engineer, water::  Fair  Speech:  Clear and Coherent  Volume:  fluctuates  Mood:  Anxious and Depressed  Affect:  Restricted  Thought Process:  Coherent and Goal Directed  Orientation:  Full (Time, Place, and Person)  Thought Content:  symptoms events worries concerns  Suicidal Thoughts:  No  Homicidal Thoughts:  No  Memory:  Immediate;   Fair Recent;   Fair Remote;   Fair  Judgement:  Fair  Insight:  Present and Shallow  Psychomotor Activity:  Restlessness  Concentration:  Fair  Recall:  [place]  Language: Fair  Akathisia:  No  Handed:  Right  AIMS (if indicated):     Assets:  Desire for Improvement Housing Social Support  ADL's:  Intact  Cognition: WNL  Sleep:      Treatment Plan Summary: Daily contact with patient to assess and evaluate symptoms and progress in treatment and Medication management Supportive approach/coping skills Alcohol dependence; Assist with relapse prevention plan, ativan 1 mg every six hours prn withdrawal symptoms  Depression; Determined to be alcohol induced, exacerbated by situational stressors  Anxiety/agitation: Continue the Neurontin 100 mg TID and optimize response Work with CBT/mindfulness Explore residential treatment options  Observation Level/Precautions:  Continuous Observation  Laboratory:  As per the ED  Psychotherapy:  Individual/group   Medications:  Ativan 1 mg every six hours as needed for symptoms of alcohol withdrawal, CIWA every shift to monitor   Consultations: As needed  Discharge Concerns:  Need for residential treatment  Estimated LOS: 24-48 hours  Other: Observation staff to explore residential treatment options   Elmarie Shiley, PMHNP-BC 9/22/20173:27 PM  Agree with NP note and assessment as above

## 2016-07-08 NOTE — Progress Notes (Signed)
[facility] in [place] called. There are no rooms available.

## 2016-07-08 NOTE — ED Provider Notes (Signed)
8:50 AM patient is resting comfortably in no distress alert Glasgow Coma Score 15 and ambulates without difficulty. Plan [facility]   Doug SouSam Aijah Lattner, MD 07/08/16 0900

## 2016-07-08 NOTE — ED Notes (Signed)
  [facility]

## 2016-07-08 NOTE — ED Notes (Signed)
Pt cleared for D/C by Dr. Ethelda ChickJacubowitz

## 2016-07-08 NOTE — BH Assessment (Signed)
[facility]  Patient reports passive SI without a plan.  Patient reports drinking 3 and a half (40oz of ) beer tonight.  Patient reports that he usually drinks 4 -5 (40oz) beer daily.  Patient denies HI and Psychosis.    After the patient received his MSE Screening it was determined [facility].  Kim Banker(RN) [facility] inform [facility] that that patient will be coming by [place] for medical clearance.   Per [facility](AC) Kim the patient has been accepted to the Observation Unit.

## 2016-07-08 NOTE — BH Assessment (Signed)
Tele Assessment Note   Tim [name] is a 60 year old white male that is a [facility].  Patient received a MSE Screening from the FNP, Nira ConnJDelmar Landauason Berry.  Patient reports passive SI without a plan.  Patient reports increased depression associated with his inability to stop drinking.  Patient reports that he would be able to contract for safety if he was able to go to a substance abuse facility.   Patient reports drinking 3 and a half (40oz of ) beer tonight.  Patient reports that he usually drinks 4 -5 (40oz) beer daily.  Patient reports that he has been addicted to alcohol all of his life.    Patient reports some withdrawal symptoms.  Patient reports that he has been to [facility], [facility], [facility], [facility] in the past for substance abuse and depression.  Patient reports that his last drink was last night at 9:30pm.  Patient denies a history of seizures.  Patient reports [facility].  Patient reports that he has been compliant with taking his psychiatric medication.    Patient denies HI and Psychosis.   Patient reports that he lives with his mother and he has not worked since 2009 and he just wants to get his life back.    Diagnosis: Alcohol Abuse and Anxiety Disorder   Past Medical History:  Past Medical History:  Diagnosis Date  . Alcoholism (HCC)   . Anxiety   . COPD (chronic obstructive pulmonary disease) (HCC)   . Depression   . Hep C w/o coma, chronic (HCC)   . Mental health disorder   . Scoliosis   . Thyroid disease     Past Surgical History:  Procedure Laterality Date  . SKIN CANCER EXCISION    . THYROID SURGERY      Family History:  Family History  Problem Relation Age of Onset  . Dementia Mother     Social History:  reports that he has been smoking Cigarettes.  He has a 20.00 pack-year smoking history. He has never used smokeless tobacco. He reports that he drinks alcohol. He reports that he uses drugs, including Cocaine and  Marijuana.  Additional Social History:  Alcohol / Drug Use History of alcohol / drug use?: Yes Longest period of sobriety (when/how long): Patient reprots that he is not able to remember  Negative Consequences of Use: Financial, Personal relationships, Work / School Withdrawal Symptoms: Tingling, Patient aware of relationship between substance abuse and physical/medical complications, Weakness, Sweats, Fever / Chills (Hands are shaking) Substance #1 Name of Substance 1: Alcohol 1 - Age of First Use: 12 1 - Amount (size/oz): 4-5 (40 0z) beer 1 - Frequency: Daily 1 - Duration: Patient reports drinking all of his life.  CIWA:   COWS:    PATIENT STRENGTHS: (choose at least two) Average or above average intelligence Communication skills  Allergies: No Known Allergies  Home Medications:  (Not in a hospital admission)  OB/GYN Status:  No LMP for male patient.  General Assessment Data [facility] Assessment[facility] TTS Assessment: In system Is this a Tele or Face-to-Face Assessment?: Face-to-Face Is this an Initial Assessment or a Re-assessment for this encounter?: Initial Assessment Marital status: Single Maiden name: NA Is patient pregnant?: No Pregnancy Status: No Living Arrangements: Parent Can pt return to current living arrangement?: Yes Admission Status: Voluntary Is patient capable of signing voluntary admission?: Yes Referral Source: Self/Family/Friend Insurance type: Self Pay  Medical Screening Exam (  [facility] Walk-in ONLY) Medical Exam completed: Yes (MSE completed by FNP, Nira Conn)  Crisis Care Plan Living Arrangements: Parent Legal Guardian:  (NA) Name of Psychiatrist: [facility] Name of Therapist: [facility]  Education Status Is patient currently in school?: No Current Grade: NA Highest grade of school patient has completed: NA Name of school: NA Contact person: NA  Risk to self with the past 6 months Suicidal Ideation:  Yes-Currently Present Has patient been a risk to self within the past 6 months prior to admission? : No Suicidal Intent: No Has patient had any suicidal intent within the past 6 months prior to admission? : No Is patient at risk for suicide?: No Suicidal Plan?: No Has patient had any suicidal plan within the past 6 months prior to admission? : No Access to Means: No What has been your use of drugs/alcohol within the last 12 months?: Alcohol Previous Attempts/Gestures: Yes How many times?: 3 Other Self Harm Risks: Cutting in the past Triggers for Past Attempts: Family contact Intentional Self Injurious Behavior: Cutting (2014 was the last time that he cut himself) Comment - Self Injurious Behavior: None currently Family Suicide History: No Recent stressful life event(s): Conflict (Comment), Job Loss, Financial Problems Persecutory voices/beliefs?: No Depression: Yes Depression Symptoms: Despondent, Insomnia, Tearfulness, Isolating, Fatigue, Guilt, Loss of interest in usual pleasures, Feeling worthless/self pity Substance abuse history and/or treatment for substance abuse?: Yes (Alcohol) Suicide prevention information given to non-admitted patients: Not applicable  Risk to Others within the past 6 months Homicidal Ideation: No Does patient have any lifetime risk of violence toward others beyond the six months prior to admission? : No Thoughts of Harm to Others: No Current Homicidal Intent: No Current Homicidal Plan: No Access to Homicidal Means: No Identified Victim: None Reported History of harm to others?: No Assessment of Violence: None Noted Violent Behavior Description: n Does patient have access to weapons?: No Criminal Charges Pending?: No Does patient have a court date: No Is patient on probation?: No  Psychosis Hallucinations: None noted Delusions: None noted  Mental Status Report Appearance/Hygiene: Body odor (Strong odor of alcohol) Eye Contact: Fair Motor  Activity: Freedom of movement, Restlessness Speech: Logical/coherent Level of Consciousness: Alert Mood: Depressed Affect: Appropriate to circumstance Anxiety Level: Minimal Thought Processes: Coherent, Relevant Judgement: Unimpaired Orientation: Person, Place, Time, Situation Obsessive Compulsive Thoughts/Behaviors: None  Cognitive Functioning Concentration: Decreased Memory: Recent Intact, Remote Intact IQ: Average Insight: Fair Impulse Control: Poor Appetite: Poor Weight Loss: 5 Weight Gain: 0 Sleep: Decreased Total Hours of Sleep: 5 Vegetative Symptoms: Decreased grooming, Not bathing  ADLScreening [facility]) Patient's cognitive ability adequate to safely complete daily activities?: Yes Patient able to express need for assistance with ADLs?: Yes Independently performs ADLs?: Yes (appropriate for developmental age)  Prior Inpatient Therapy Prior Inpatient Therapy: Yes Prior Therapy Dates: 2017 - Feb  Prior Therapy Facilty/Provider(s): [facility] Reason for Treatment: SI and SA  Prior Outpatient Therapy Prior Outpatient Therapy: Yes Prior Therapy Dates: Ongoing Prior Therapy Facilty/Provider(s): [facility] Reason for Treatment: Medication Management Does patient have an ACCT team?: No Does patient have Intensive In-House Services?  : No Does patient have [facility] services? : No Does patient have P4CC services?: No  ADL Screening (condition at time of admission) Patient's cognitive ability adequate to safely complete daily activities?: Yes Is the patient deaf or have difficulty hearing?: No Does the patient have difficulty seeing, even when wearing glasses/contacts?: No Does the patient have difficulty concentrating, remembering, or making decisions?: Yes Patient able to express need  for assistance with ADLs?: Yes Does the patient have difficulty dressing or bathing?: No Independently performs ADLs?: Yes (appropriate for developmental age) Does the patient  have difficulty walking or climbing stairs?: No Weakness of Legs: None Weakness of Arms/Hands: None  Home Assistive Devices/Equipment Home Assistive Devices/Equipment: None    Abuse/Neglect Assessment (Assessment to be complete while patient is alone) Physical Abuse: Denies Verbal Abuse: Denies Sexual Abuse: Denies Exploitation of patient/patient's resources: Denies Self-Neglect: Denies Values / Beliefs Cultural Requests During Hospitalization: None Spiritual Requests During Hospitalization: None Consults Spiritual Care Consult Needed: No Social Work Consult Needed: No Merchant navy officer (For Healthcare) Does patient have an advance directive?: No Would patient like information on creating an advanced directive?: No - patient declined information    Additional Information 1:1 In Past 12 Months?: No CIRT Risk: No Elopement Risk: No Does patient have medical clearance?: Yes     Disposition: Per Nira Conn, FNP - patient meets criteria for the OBS Unit.   Disposition Initial Assessment Completed for this Encounter: Yes Disposition of Patient: Other dispositions (Patient meets criteria for the OBS Unit )  Phillip Heal LaVerne 07/08/2016 3:19 AM

## 2016-07-08 NOTE — ED Provider Notes (Signed)
[facility]-EMERGENCY DEPT Provider Note   CSN: [id number] Arrival date & time: 07/08/16  0258  By signing my name below, I, [name], attest that this documentation has been prepared under the direction and in the presence of Pricilla LovelessScott Karis Emig, MD . Electronically Signed: [name], Scribe. 07/08/2016. 3:44 AM.  History   Chief Complaint Chief Complaint  Patient presents with  . Suicidal   The history is provided by the patient. No language interpreter was used.   HPI Comments: Tim [name] is a 60 y.o. male with PMHx of alcoholism and depression, who presents to the Emergency Department for an evaluation of suicidal ideation that has been ongoing over the past couple days. Pt reports he is "sick of the way his life is going and does not care about anything anymore." He states he has attempted to hurt himself in the past but does not currently have a plan to kill himself. He denies fever, chest pain and shortness of breath. He notes he drank 4 40 oz beers tonight.   Per note: pt was seen earlier this evening requesting detox from [facility] and was evaluated and given resources to follow-up with. Pt left the ED [facility] and stated that he was suicidal. [facility] medical lab screen[facility]. When pt was asked about his suicidal ideations, he stated that he "had to say that he was suicidal so they would keep me."    Past Medical History:  Diagnosis Date  . Alcoholism (HCC)   . Anxiety   . COPD (chronic obstructive pulmonary disease) (HCC)   . Depression   . Hep C w/o coma, chronic (HCC)   . Mental health disorder   . Scoliosis   . Thyroid disease     Patient Active Problem List   Diagnosis Date Noted  . MDD (major depressive disorder), recurrent severe, without psychosis (HCC) 05/23/2016  . Hepatitis C 05/23/2016  . Alcohol use disorder, severe, dependence (HCC) 11/05/2015  . Depression, major, recurrent, moderate (HCC)  11/05/2015  . GAD (generalized anxiety disorder) 11/05/2015  . Cellulitis of right lower extremity 03/29/2015    Past Surgical History:  Procedure Laterality Date  . SKIN CANCER EXCISION    . THYROID SURGERY      Home Medications    Prior to Admission medications   Medication Sig Start Date End Date Taking? Authorizing Provider  diclofenac sodium (VOLTAREN) 1 % GEL Apply 2 g topically 4 (four) times daily. 05/27/16  Yes Adonis BrookSheila Agustin, NP  FLUoxetine (PROZAC) 40 MG capsule Take 2 capsules (80 mg total) by mouth daily. 05/27/16  Yes Adonis BrookSheila Agustin, NP  gabapentin (NEURONTIN) 300 MG capsule Take 1 capsule (300 mg total) by mouth 3 (three) times daily. 05/27/16  Yes Adonis BrookSheila Agustin, NP  hydrOXYzine (ATARAX/VISTARIL) 25 MG tablet Take 1 tablet (25 mg total) by mouth every 6 (six) hours as needed for anxiety (or CIWA score </= 10). 05/27/16  Yes Adonis BrookSheila Agustin, NP  traZODone (DESYREL) 50 MG tablet Take 1 tablet (50 mg total) by mouth at bedtime as needed for sleep. 05/27/16  Yes Adonis BrookSheila Agustin, NP  chlordiazePOXIDE (LIBRIUM) 25 MG capsule 50mg  PO TID x 1D, then 25-50mg  PO BID X 1D, then 25-50mg  PO QD X 1D 07/07/16   Melene Planan Floyd, DO  naproxen (NAPROSYN) 375 MG tablet Take 1 tablet (375 mg total) by mouth 2 (two) times daily. Patient not taking: Reported on 07/07/2016 06/11/16   Cammy CopaAbigail  Harris, PA-C  nicotine (NICODERM CQ - DOSED IN MG/24 HOURS) 21 mg/24hr patch Place 1 patch (21 mg total) onto the skin daily. Patient not taking: Reported on 07/07/2016 05/27/16   Adonis Brook, NP    Family History Family History  Problem Relation Age of Onset  . Dementia Mother     Social History Social History  Substance Use Topics  . Smoking status: Current Every Day Smoker    Packs/day: 0.50    Years: 40.00    Types: Cigarettes  . Smokeless tobacco: Never Used  . Alcohol use Yes     Comment: drank 1/5 today.     Allergies   Review of patient's allergies indicates no known allergies.   Review of  Systems Review of Systems  Constitutional: Negative for fever.  Respiratory: Negative for shortness of breath.   Cardiovascular: Negative for chest pain.  Psychiatric/Behavioral: Positive for suicidal ideas.   Physical Exam Updated Vital Signs BP 98/64 (BP Location: Left Arm)   Pulse 75   Temp 98.1 F (36.7 C) (Oral)   Resp 16   SpO2 95%   Physical Exam  Constitutional: He is oriented to person, place, and time. He appears well-developed and well-nourished.  HENT:  Head: Normocephalic and atraumatic.  Right Ear: External ear normal.  Left Ear: External ear normal.  Nose: Nose normal.  Eyes: Right eye exhibits no discharge. Left eye exhibits no discharge.  Neck: Neck supple.  Cardiovascular: Normal rate, regular rhythm and normal heart sounds.   Pulmonary/Chest: Effort normal and breath sounds normal.  Abdominal: Soft. There is no tenderness.  Musculoskeletal: He exhibits no edema.  Neurological: He is alert and oriented to person, place, and time.  Skin: Skin is warm and dry.  Psychiatric: He has a normal mood and affect. His speech is normal and behavior is normal. He expresses suicidal ideation. He expresses no suicidal plans.  Nursing note and vitals reviewed.  ED Treatments / Results  Labs (all labs ordered are listed, but only abnormal results are displayed) Labs Reviewed  COMPREHENSIVE METABOLIC PANEL - Abnormal; Notable for the following:       Result Value   Potassium 3.3 (*)    CO2 21 (*)    All other components within normal limits  ETHANOL - Abnormal; Notable for the following:    Alcohol, Ethyl (B) 81 (*)    All other components within normal limits  ACETAMINOPHEN LEVEL - Abnormal; Notable for the following:    Acetaminophen (Tylenol), Serum <10 (*)    All other components within normal limits  URINE RAPID DRUG SCREEN, HOSP PERFORMED - Abnormal; Notable for the following:    Benzodiazepines POSITIVE (*)    All other components within normal limits    SALICYLATE LEVEL  CBC    EKG  EKG Interpretation None       Radiology No results found.  Procedures Procedures (including critical care time)  Medications Ordered in ED Medications  LORazepam (ATIVAN) tablet 0-4 mg (not administered)    Followed by  LORazepam (ATIVAN) tablet 0-4 mg (not administered)  thiamine (VITAMIN B-1) tablet 100 mg (not administered)    Or  thiamine (B-1) injection 100 mg (not administered)  potassium chloride SA (K-DUR,KLOR-CON) CR tablet 40 mEq (not administered)    DIAGNOSTIC STUDIES:  Oxygen Saturation is 95% on RA, adequate by my interpretation.    COORDINATION OF CARE:  3:40 AM Discussed treatment plan with pt at bedside and pt agreed to plan.  Initial Impression / Assessment  and Plan / ED Course  I have reviewed the triage vital signs and the nursing notes.  Pertinent labs & imaging results that were available during my care of the patient were reviewed by me and considered in my medical decision making (see chart for details).  Clinical Course  Comment By Time  Patient will await medical screening labs. Otherwise he has been accepted to behavioral health for observation per the TTS note from earlier in the evening. Pricilla Loveless, MD 09/22 [phone]    Patient appears medically stable for psychiatric admission and treatment. Initially had a soft blood pressure of 98 systolic but repeat is normal. Plan to admit to behavioral health observation unit.  I personally performed the services described in this documentation, which was scribed in my presence. The recorded information has been reviewed and is accurate.   Final Clinical Impressions(s) / ED Diagnoses   Final diagnoses:  Alcohol abuse    New Prescriptions New Prescriptions   No medications on file     Pricilla Loveless, MD 07/08/16 [phone]

## 2016-07-08 NOTE — Progress Notes (Signed)
Patient ID: Tim LandauJeffrey L [name], male   DOB: November 27, 1955, 60 y.o.   MRN: [id number]  D: Patient pleasant on approach tonight. Reports that he needs to use the resources given to him instead of doing the same things over and over again. Reports minimal withdrawal symptoms at this time. Reports some depressed mood but denies any SI tonight. 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 CIWA=4. A: Staff will continue to monitor on q 15 minute checks, follow treatment plan, and give medications as ordered. R: Cooperative on the unit.

## 2016-07-08 NOTE — Progress Notes (Signed)
Pt has been sleeping since he came in for admission. Writer [place] [facility] [place]. Per call no men's beds are available at this time. Was given two other facilities to [place] [facility] [place] [phone] and [place] [phone]. Will wait until pt wakes and ask if pt is interested in either of these services.

## 2016-07-08 NOTE — ED Provider Notes (Signed)
[facility] DEPT Provider Note   CSN: [id number] Arrival date & time: 07/07/16  2308     History   Chief Complaint Chief Complaint  Patient presents with  . detox    HPI Tim [name] is a 60 y.o. male.  60 yo M with a chief complaint of alcohol intoxication. Patient is looking for inpatient rehabilitation. States that he walks 6 hours from home to be evaluated and put in an inpatient facility. Denies chest pain shortness of breath suicidal or homicidal ideation denies hallucinations.   The history is provided by the patient.  Illness  This is a new problem. The current episode started less than 1 hour ago. The problem occurs constantly. The problem has not changed since onset.Pertinent negatives include no chest pain, no abdominal pain, no headaches and no shortness of breath. Nothing aggravates the symptoms. Nothing relieves the symptoms. He has tried nothing for the symptoms. The treatment provided no relief.    Past Medical History:  Diagnosis Date  . Alcoholism (HCC)   . Anxiety   . COPD (chronic obstructive pulmonary disease) (HCC)   . Depression   . Hep C w/o coma, chronic (HCC)   . Mental health disorder   . Scoliosis   . Thyroid disease     Patient Active Problem List   Diagnosis Date Noted  . MDD (major depressive disorder), recurrent severe, without psychosis (HCC) 05/23/2016  . Hepatitis C 05/23/2016  . Alcohol use disorder, severe, dependence (HCC) 11/05/2015  . Depression, major, recurrent, moderate (HCC) 11/05/2015  . GAD (generalized anxiety disorder) 11/05/2015  . Cellulitis of right lower extremity 03/29/2015    Past Surgical History:  Procedure Laterality Date  . SKIN CANCER EXCISION    . THYROID SURGERY         Home Medications    Prior to Admission medications   Medication Sig Start Date End Date Taking? Authorizing Provider  diclofenac sodium (VOLTAREN) 1 % GEL Apply 2 g topically 4 (four) times daily. 05/27/16  Yes Adonis BrookSheila  Agustin, NP  FLUoxetine (PROZAC) 40 MG capsule Take 2 capsules (80 mg total) by mouth daily. 05/27/16  Yes Adonis BrookSheila Agustin, NP  gabapentin (NEURONTIN) 300 MG capsule Take 1 capsule (300 mg total) by mouth 3 (three) times daily. 05/27/16  Yes Adonis BrookSheila Agustin, NP  hydrOXYzine (ATARAX/VISTARIL) 25 MG tablet Take 1 tablet (25 mg total) by mouth every 6 (six) hours as needed for anxiety (or CIWA score </= 10). 05/27/16  Yes Adonis BrookSheila Agustin, NP  traZODone (DESYREL) 50 MG tablet Take 1 tablet (50 mg total) by mouth at bedtime as needed for sleep. 05/27/16  Yes Adonis BrookSheila Agustin, NP  chlordiazePOXIDE (LIBRIUM) 25 MG capsule 50mg  PO TID x 1D, then 25-50mg  PO BID X 1D, then 25-50mg  PO QD X 1D 07/07/16   Melene Planan Tracie Dore, DO  naproxen (NAPROSYN) 375 MG tablet Take 1 tablet (375 mg total) by mouth 2 (two) times daily. Patient not taking: Reported on 07/07/2016 06/11/16   Arthor CaptainAbigail Harris, PA-C  nicotine (NICODERM CQ - DOSED IN MG/24 HOURS) 21 mg/24hr patch Place 1 patch (21 mg total) onto the skin daily. Patient not taking: Reported on 07/07/2016 05/27/16   Adonis BrookSheila Agustin, NP    Family History Family History  Problem Relation Age of Onset  . Dementia Mother     Social History Social History  Substance Use Topics  . Smoking status: Current Every Day Smoker    Packs/day: 0.50    Years: 40.00    Types: Cigarettes  .  Smokeless tobacco: Never Used  . Alcohol use Yes     Comment: drank 1/5 today.     Allergies   Review of patient's allergies indicates no known allergies.   Review of Systems Review of Systems  Constitutional: Negative for chills and fever.  HENT: Negative for congestion and facial swelling.   Eyes: Negative for discharge and visual disturbance.  Respiratory: Negative for shortness of breath.   Cardiovascular: Negative for chest pain and palpitations.  Gastrointestinal: Negative for abdominal pain, diarrhea and vomiting.  Musculoskeletal: Negative for arthralgias and myalgias.  Skin: Negative for  color change and rash.  Neurological: Negative for tremors, syncope and headaches.  Psychiatric/Behavioral: Negative for confusion and dysphoric mood.     Physical Exam Updated Vital Signs BP 96/65   Pulse 64   Temp 97.7 F (36.5 C) (Oral)   Physical Exam  Constitutional: He is oriented to person, place, and time. He appears well-developed and well-nourished.  Clinically intoxicated  HENT:  Head: Normocephalic and atraumatic.  Eyes: EOM are normal. Pupils are equal, round, and reactive to light.  Neck: Normal range of motion. Neck supple. No JVD present.  Cardiovascular: Normal rate and regular rhythm.  Exam reveals no gallop and no friction rub.   No murmur heard. Pulmonary/Chest: No respiratory distress. He has no wheezes.  Abdominal: He exhibits no distension and no mass. There is no tenderness. There is no rebound and no guarding.  Musculoskeletal: Normal range of motion.  Neurological: He is alert and oriented to person, place, and time.  Skin: No rash noted. No pallor.  Psychiatric: He has a normal mood and affect. His behavior is normal.  Nursing note and vitals reviewed.    ED Treatments / Results  Labs (all labs ordered are listed, but only abnormal results are displayed) Labs Reviewed - No data to display  EKG  EKG Interpretation None       Radiology No results found.  Procedures Procedures (including critical care time)  Medications Ordered in ED Medications - No data to display   Initial Impression / Assessment and Plan / ED Course  I have reviewed the triage vital signs and the nursing notes.  Pertinent labs & imaging results that were available during my care of the patient were reviewed by me and considered in my medical decision making (see chart for details).  Clinical Course    60 yo M With a chief complaint of request is for detox. Discussed with patient this is no longer offered as an inpatient out of the hospital. Patient is  clinically able to ambulate without difficulty. Will have him follow-up with his PCP, given list of outpatient options.   12:07 AM:  I have discussed the diagnosis/risks/treatment options with the patient and family and believe the pt to be eligible for discharge home to follow-up with PCP. We also discussed returning to the ED immediately if new or worsening sx occur. We discussed the sx which are most concerning (e.g., sudden worsening pain, fever, inability to tolerate by mouth) that necessitate immediate return. Medications administered to the patient during their visit and any new prescriptions provided to the patient are listed below.  Medications given during this visit Medications - No data to display   The patient appears reasonably screen and/or stabilized for discharge and I doubt any other medical condition or other [facility] requiring further screening, evaluation, or treatment in the ED at this time prior to discharge.    Final Clinical Impressions(s) / ED Diagnoses  Final diagnoses:  ETOH abuse    New Prescriptions New Prescriptions   CHLORDIAZEPOXIDE (LIBRIUM) 25 MG CAPSULE    50mg  PO TID x 1D, then 25-50mg  PO BID X 1D, then 25-50mg  PO QD X 1D     Melene Plan, DO 07/08/16 0007

## 2016-07-08 NOTE — BH Assessment (Addendum)
[facility]   Per Andrey CampanileSandy at [facility] they do have a open detox bed.  Writer will fax referral information.

## 2016-07-08 NOTE — ED Notes (Signed)
Bed: [facility] Expected date:  Expected time:  Means of arrival:  Comments: 

## 2016-07-09 DIAGNOSIS — F1020 Alcohol dependence, uncomplicated: Secondary | ICD-10-CM | POA: Diagnosis not present

## 2016-07-09 MED ORDER — FLUOXETINE HCL 40 MG PO CAPS: 80.0000 mg | ORAL_CAPSULE | Freq: Every day | ORAL | 0 refills | Status: DC

## 2016-07-09 MED ORDER — DICLOFENAC SODIUM 1 % TD GEL: 2.0000 g | Freq: Four times a day (QID) | TRANSDERMAL | 0 refills | Status: DC

## 2016-07-09 MED ORDER — GABAPENTIN 300 MG PO CAPS: 300.0000 mg | ORAL_CAPSULE | Freq: Three times a day (TID) | ORAL | 0 refills | Status: DC

## 2016-07-09 MED ORDER — NICOTINE 21 MG/24HR TD PT24: 21.0000 mg | MEDICATED_PATCH | Freq: Every day | TRANSDERMAL | 0 refills | Status: DC

## 2016-07-09 NOTE — Discharge Instructions (Signed)
Pt was provided with a list of OPT resources which included [facility], [facility], [facility], and [facility].

## 2016-07-09 NOTE — Progress Notes (Signed)
D:Pt reports that he wants to go home today and take care of his chickens. He is requesting resources for substance abuse. Pt lives with his mother.  A:Gave medications as ordered. Offered support and encouragement. R:Pt denies si and hi. Safety maintained in the observation unit.

## 2016-07-09 NOTE — [facility] Counselor (Signed)
This Clinical research associatewriter spoke with pt in regards to disposition post discharge. This pt initially stated that he wanted to receive Residential placement, but then stated that he just wants to be discharged home with OPT resources. This Clinical research associatewriter is providing him with a list of appropriate resources. Pt is denying SI/HI and appears to be stable for discharge. Pt will be discharged later this morning.

## 2016-07-09 NOTE — Progress Notes (Signed)
Pt d/c from the obs unit. All items returned. D/C instructions given. Pt denies si and hi.

## 2016-07-09 NOTE — Discharge Summary (Signed)
[facility]-Observation Unit Discharge Summary Note  Patient:  Tim [name] is an 60 y.o., male MRN:  [id number] DOB:  December 17, 1955 Patient phone:  [phone] (home)  Patient address:   [address] [place] [address],  Total Time spent with patient: 30 minutes  Date of Admission:  07/08/2016 Date of Discharge: 07/09/2016  Reason for Admission:   Alcohol abuse  Principal Problem: Alcohol use disorder, severe, dependence [facility]) Discharge Diagnoses: Patient Active Problem List   Diagnosis Date Noted  . Severe episode of recurrent major depressive disorder, without psychotic features (HCC) [F33.2] 07/08/2016  . MDD (major depressive disorder), recurrent severe, without psychosis (HCC) [F33.2] 05/23/2016  . Hepatitis C [B19.20] 05/23/2016  . Alcohol use disorder, severe, dependence (HCC) [F10.20] 11/05/2015  . 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 GAD (generalized anxiety disorder) [F41.1] 11/05/2015  . Cellulitis of right lower extremity [L03.115] 03/29/2015    Past Psychiatric History: see HPI  Past Medical History:  Past Medical History:  Diagnosis Date  . Alcoholism (HCC)   . Anxiety   . COPD (chronic obstructive pulmonary disease) (HCC)   . Depression   . Hep C w/o coma, chronic (HCC)   . History of hiatal hernia    hernia rt groin  . Mental health disorder   . Scoliosis   . Thyroid disease     Past Surgical History:  Procedure Laterality Date  . SKIN CANCER EXCISION    . THYROID SURGERY     Family History:  Family History  Problem Relation Age of Onset  . Dementia Mother    Family Psychiatric  History: see HPI Social History:  History  Alcohol Use  . Yes    Comment: drank 1/5 today.     History  Drug Use  . Types: Cocaine, Marijuana    Comment: also states will take pain pills every now and then, last use of cocaine and ETOH today at 1400    Social History   Social History  . Marital status: Single    Spouse name:  N/A  . Number of children: N/A  . Years of education: N/A   Social History Main Topics  . Smoking status: Current Every Day Smoker    Packs/day: 0.50    Years: 40.00    Types: Cigarettes  . Smokeless tobacco: Never Used  . Alcohol use Yes     Comment: drank 1/5 today.  . Drug use:     Types: Cocaine, Marijuana     Comment: also states will take pain pills every now and then, last use of cocaine and ETOH today at 1400  . Sexual activity: No   Other Topics Concern  . None   Social History Narrative   ** Merged History Encounter **        Hospital Course:     CASMERE HOLLENBECK a 60 year old white male that is a walk in at [facility]. Patient reports passive SI without a plan.  Patient reports increased depression associated with his inability to stop drinking.  Patient reports that he would be able to contract for safety if he was able to go to a substance abuse facility. Patient reports drinking 3 and a half (40oz of ) beer tonight. Patient reports that he usually drinks 4 -5 (40oz) beer daily.  Patient reports that he has been addicted to alcohol all of his life. Patient reports some withdrawal symptoms.  Patient reports that he has been to [facility], [facility], [facility],  [facility] in the past for substance abuse and depression.  Patient reports that his last drink was night before walking into [facility].Patient denies a history of seizures. Patient reports receiving psychiatric medication from [facility].  Patient reports that he has been compliant with taking his psychiatric medication. Patient denies HI and Psychosis.  Patient reports that he lives with his mother and he has not worked since 2009 and he just wants to get his life back.   DEO MEHRINGER was admitted to the [facility] for Alcohol use disorder, severe, dependence (HCC) and crisis management.  He was treated with meds and their indications listed below.  Medical problems were identified and treated as needed.  Home medications were restarted  as appropriate. Patient was monitored overnight in the [facility]-Observation Unit and found stable for discharge the next morning. He requested discharge wanting to get back home to take care his animals.   Improvement was monitored by observation and [name] daily report of symptom reduction.  Emotional and mental status was monitored by daily self inventory reports completed by [name] and clinical staff.  Patient reported continued improvement, denied any new concerns.  Patient had been compliant on medications and denied side effects.  Support and encouragement was provided.         [name] was evaluated by the Observation Unit NP and clinical staff for stability and plans for continued recovery upon discharge.  He was offered further treatment options upon discharge to include outpatient resources. Patient declined a direct referral to [facility] as he reported his desire to go home first. He will follow up with agencies listed below for medication management and counseling.  Encouraged patient to maintain satisfactory support network and home environment.  Advised to adhere to medication compliance and outpatient treatment follow up. Patient declined prescriptions reporting that he already had his medications at home.   [name] motivation was an integral factor for scheduling further treatment.  Employment, transportation, bed availability, health status, family support, and any pending legal issues were also considered during his hospital stay.  Upon completion of this admission the patient was both mentally and medically stable for discharge denying suicidal/homicidal ideation, auditory/visual/tactile hallucinations, delusional thoughts and paranoia. He appeared motivated to follow up at [facility] after discharge for continued care.   Physical Findings: AIMS: Facial and Oral Movements Muscles of Facial Expression: None, normal Lips and Perioral Area: None, normal Jaw: None,  normal Tongue: None, normal,Extremity Movements Upper (arms, wrists, hands, fingers): None, normal Lower (legs, knees, ankles, toes): None, normal, Trunk Movements Neck, shoulders, hips: None, normal, Overall Severity Severity of abnormal movements (highest score from questions above): None, normal Incapacitation due to abnormal movements: None, normal Patient's awareness of abnormal movements (rate only patient's report): No Awareness, Dental Status Current problems with teeth and/or dentures?: Yes Does patient usually wear dentures?: No  CIWA:  CIWA-Ar Total: 0 COWS:     Musculoskeletal: Strength & Muscle Tone: within normal limits Gait & Station: normal Patient leans: N/A  Physical Exam  Nursing note and vitals reviewed. Constitutional: He appears well-developed.    Review of Systems  Constitutional: Negative.  Negative for fever.  HENT: Negative.   Eyes: Negative.  Negative for blurred vision.  Respiratory: Negative.  Negative for cough.   Cardiovascular: Negative.  Negative for chest pain.  Gastrointestinal: Negative.  Negative for heartburn.  Genitourinary: Negative.  Negative for dysuria.  Musculoskeletal: Negative.  Negative for myalgias.  Skin: Negative.  Negative for  rash.  Neurological: Negative.  Negative for dizziness and headaches.  Endo/Heme/Allergies: Negative.  Does not bruise/bleed easily.  Psychiatric/Behavioral: Positive for substance abuse (Alcohol level 81 on admission ). Negative for depression, hallucinations, memory loss and suicidal ideas. The patient is not nervous/anxious and does not have insomnia.   All other systems reviewed and are negative.   Blood pressure 110/69, pulse 64, temperature 98.1 F (36.7 C), temperature source Oral, resp. rate 16, height 6\' 3"  (1.905 m), weight 82.6 kg (182 lb), SpO2 98 %.Body mass index is 22.75 kg/m.       Has this patient used any form of tobacco in the last 30 days? (Cigarettes, Smokeless Tobacco, Cigars,  and/or Pipes) Yes, N/A  Blood Alcohol level:  Lab Results  Component Value Date   ETH 81 (H) 07/08/2016   ETH 206 (H) 06/12/2016    Metabolic Disorder Labs:  No results found for: HGBA1C, MPG No results found for: PROLACTIN No results found for: CHOL, TRIG, HDL, CHOLHDL, VLDL, LDLCALC  Discharge destination:  Home  Is patient on multiple antipsychotic therapies at discharge:  No   Has Patient had three or more failed trials of antipsychotic monotherapy by history:  No  Recommended Plan for Multiple Antipsychotic Therapies: NA     Medication List    STOP taking these medications   chlordiazePOXIDE 25 MG capsule Commonly known as:  LIBRIUM   naproxen 375 MG tablet Commonly known as:  NAPROSYN     TAKE these medications     Indication  diclofenac sodium 1 % Gel Commonly known as:  VOLTAREN Apply 2 g topically 4 (four) times daily.  Indication:  Joint Damage causing Pain and Loss of Function   FLUoxetine 40 MG capsule Commonly known as:  PROZAC Take 2 capsules (80 mg total) by mouth daily.  Indication:  Depression   gabapentin 300 MG capsule Commonly known as:  NEURONTIN Take 1 capsule (300 mg total) by mouth 3 (three) times daily.  Indication:  Agitation, Neuropathic Pain, Anxiety symptoms   hydrOXYzine 25 MG tablet Commonly known as:  ATARAX/VISTARIL Take 1 tablet (25 mg total) by mouth every 6 (six) hours as needed for anxiety (or CIWA score </= 10).  Indication:  Anxiety Neurosis   nicotine 21 mg/24hr patch Commonly known as:  NICODERM CQ - dosed in mg/24 hours Place 1 patch (21 mg total) onto the skin daily.  Indication:  Nicotine Addiction   traZODone 50 MG tablet Commonly known as:  DESYREL Take 1 tablet (50 mg total) by mouth at bedtime as needed for sleep.  Indication:  Trouble Sleeping      Follow-up Information    [facility]. Go on 07/16/2016.   Specialty:  Behavioral Health Why:  Pt will need to follow up with [facility] at least (7) days post  discharge to assist with his substance abuse issues. Contact informationElpidio Eric: [address] [place] [address] [phone]           Follow-up recommendations:  Activity:  as tol Diet:  as tol  Comments:  1.  Take all your medications as prescribed.   2.  Report any adverse side effects to outpatient provider. 3.  Patient instructed to not use alcohol or illegal drugs while on prescription medicines. 4.  In the event of worsening symptoms, instructed patient to call 911, the crisis hotline or go to nearest emergency room for evaluation of symptoms.  SignedFransisca Kaufmann: Lj Miyamoto, NP-BC 07/09/2016, 2:21 PM

## 2016-07-12 ENCOUNTER — Emergency Department (HOSPITAL_COMMUNITY)
Admission: EM | Admit: 2016-07-12 | Discharge: 2016-07-13 | Disposition: A | Discharge status: Home / Self Care | Payer: Self-pay | Attending: Emergency Medicine | Admitting: Emergency Medicine

## 2016-07-12 DIAGNOSIS — IMO0002 Reserved for concepts with insufficient information to code with codable children: Secondary | ICD-10-CM

## 2016-07-12 DIAGNOSIS — F1490 Cocaine use, unspecified, uncomplicated: Secondary | ICD-10-CM | POA: Insufficient documentation

## 2016-07-12 DIAGNOSIS — Z79899 Other long term (current) drug therapy: Secondary | ICD-10-CM | POA: Insufficient documentation

## 2016-07-12 DIAGNOSIS — F10920 Alcohol use, unspecified with intoxication, uncomplicated: Secondary | ICD-10-CM

## 2016-07-12 DIAGNOSIS — F10120 Alcohol abuse with intoxication, uncomplicated: Secondary | ICD-10-CM | POA: Insufficient documentation

## 2016-07-12 DIAGNOSIS — F17210 Nicotine dependence, cigarettes, uncomplicated: Secondary | ICD-10-CM | POA: Insufficient documentation

## 2016-07-12 DIAGNOSIS — F1290 Cannabis use, unspecified, uncomplicated: Secondary | ICD-10-CM | POA: Insufficient documentation

## 2016-07-12 DIAGNOSIS — J449 Chronic obstructive pulmonary disease, unspecified: Secondary | ICD-10-CM | POA: Insufficient documentation

## 2016-07-12 NOTE — ED Notes (Signed)
Bed: WTR6 Expected date:  Expected time:  Means of arrival:  Comments: 

## 2016-07-13 ENCOUNTER — Encounter (HOSPITAL_COMMUNITY): Payer: Self-pay

## 2016-07-13 LAB — CBC
HCT: 38 % — ABNORMAL LOW (ref 39.0–52.0)
Hemoglobin: 13.6 g/dL (ref 13.0–17.0)
MCH: 31.1 pg (ref 26.0–34.0)
MCHC: 35.8 g/dL (ref 30.0–36.0)
MCV: 86.8 fL (ref 78.0–100.0)
PLATELETS: 202 10*3/uL (ref 150–400)
RBC: 4.38 MIL/uL (ref 4.22–5.81)
RDW: 12.9 % (ref 11.5–15.5)
WBC: 6.8 10*3/uL (ref 4.0–10.5)

## 2016-07-13 LAB — COMPREHENSIVE METABOLIC PANEL
ALBUMIN: 4.2 g/dL (ref 3.5–5.0)
ALK PHOS: 58 U/L (ref 38–126)
ALT: 18 U/L (ref 17–63)
ANION GAP: 12 (ref 5–15)
AST: 23 U/L (ref 15–41)
BUN: 20 mg/dL (ref 6–20)
CALCIUM: 9.5 mg/dL (ref 8.9–10.3)
CO2: 18 mmol/L — AB (ref 22–32)
CREATININE: 1.11 mg/dL (ref 0.61–1.24)
Chloride: 108 mmol/L (ref 101–111)
GFR calc Af Amer: >60 mL/min (ref >60)
GFR calc non Af Amer: >60 mL/min (ref >60)
GLUCOSE: 84 mg/dL (ref 65–99)
Potassium: 3.6 mmol/L (ref 3.5–5.1)
SODIUM: 138 mmol/L (ref 135–145)
Total Bilirubin: 0.8 mg/dL (ref 0.3–1.2)
Total Protein: 7 g/dL (ref 6.5–8.1)

## 2016-07-13 LAB — ETHANOL: Alcohol, Ethyl (B): 207 mg/dL — ABNORMAL HIGH (ref <5)

## 2016-07-13 MED ORDER — IBUPROFEN 200 MG PO TABS: 600.0000 mg | ORAL_TABLET | Freq: Three times a day (TID) | ORAL | Status: DC | PRN

## 2016-07-13 MED ORDER — LORAZEPAM 1 MG PO TABS: 0.0000 mg | ORAL_TABLET | Freq: Two times a day (BID) | ORAL | Status: DC

## 2016-07-13 MED ORDER — ZOLPIDEM TARTRATE 5 MG PO TABS: 5.0000 mg | ORAL_TABLET | Freq: Every evening | ORAL | Status: DC | PRN

## 2016-07-13 MED ORDER — ACETAMINOPHEN 325 MG PO TABS: 650.0000 mg | ORAL_TABLET | ORAL | Status: DC | PRN

## 2016-07-13 MED ORDER — LORAZEPAM 1 MG PO TABS: 0.0000 mg | ORAL_TABLET | Freq: Four times a day (QID) | ORAL | Status: DC

## 2016-07-13 MED ORDER — ALUM & MAG HYDROXIDE-SIMETH 200-200-20 MG/5ML PO SUSP: 30.0000 mL | ORAL | Status: DC | PRN

## 2016-07-13 MED ORDER — NICOTINE 21 MG/24HR TD PT24: 21.0000 mg | MEDICATED_PATCH | Freq: Every day | TRANSDERMAL | Status: DC

## 2016-07-13 MED ORDER — FLUOXETINE HCL 20 MG PO CAPS: 80.0000 mg | ORAL_CAPSULE | Freq: Every day | ORAL | Status: DC

## 2016-07-13 MED ORDER — GABAPENTIN 300 MG PO CAPS: 300.0000 mg | ORAL_CAPSULE | Freq: Three times a day (TID) | ORAL | Status: DC

## 2016-07-13 MED ORDER — HYDROXYZINE HCL 25 MG PO TABS: 25.0000 mg | ORAL_TABLET | Freq: Four times a day (QID) | ORAL | Status: DC | PRN

## 2016-07-13 MED ORDER — ONDANSETRON HCL 4 MG PO TABS: 4.0000 mg | ORAL_TABLET | Freq: Three times a day (TID) | ORAL | Status: DC | PRN

## 2016-07-13 MED ORDER — TRAZODONE HCL 50 MG PO TABS: 50.0000 mg | ORAL_TABLET | Freq: Every evening | ORAL | Status: DC | PRN

## 2016-07-13 NOTE — Discharge Instructions (Addendum)
[facility] °Intensive Outpatient Programs °[place] [facility]    [facility] °[address]                 [address] °[place],  [place]                 [place], [place] °[phone]                  [phone] °Both a day and evening program              *Accepts Medicaid ° °[facility]  [facility] °[address]                 [address] °[place]      [place], [place] [address] °[phone]                  [phone] ° °[facility]    [facility] Intensive Outpatient °[address]     [address] °[place], [place] [address]     [place], [place]  °[phone]      [phone] °[address] °[place], [place] [address] °[phone] °*Accepts MCD      °Residential Treatment Programs °[facility]    [facility] °[address]     [address] °[place]      [place], [place] °[phone]      [phone] or [phone] ° °[facility]     [facility] (Several in [place]) °[address]    [address] °[place] [address]     [place], [place] °[phone]      [phone] ° °[facility]   [facility] °[address]     [address] °[place], [place] [address]                 [place], [place] °[phone]                  [phone] °Admissions: 8am-3pm M-F ° °[facility] °[address] °[place] °[phone] °Self-Help/Support Groups °[facility]   Narcotics Anonymous (NA) °Variety of support groups    [facility] °[phone] (call for more info)    [address] °       [place]  - 2 meetings at this location °These referrals have been provided to you as appropriate for your clinical needs while taking into account your financial  concerns. Please be aware that agencies, practitioners and insurance companies sometimes change contracts. When calling to make an appointment have your insurance information available so the professional you are going to see can confirm whether they are covered by your plan. Take this form with you in case the person you are seeing needs a copy or to contact us. °

## 2016-07-13 NOTE — ED Notes (Signed)
AVS provided and reviewed. Understanding verbalized. Denied SI/HI/AVH. Denied pain. Pt offered no questions or concerns. Escorted off the unit, belongings returned and directed to the exit. Pt left in no acute distress. 

## 2016-07-13 NOTE — BH Assessment (Addendum)
Assessment completed. Consulted with Donell SievertSpencer Simon, PA-C who states that patient does not meet inpatient criteria at this time. Spoke with EDP who is in agreement. Outpatient resources in patient discharge instructions.    Davina PokeJoVea Eugine Bubb, LCSW Therapeutic Triage Specialist [facility] 07/13/2016 4:22 AM
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
## 2016-07-13 NOTE — ED Notes (Signed)
Patient denies SI, HI and AVH at this time. Patient reports he is requesting detox at this time. Plan of care discussed. Patient voices no complaints or concerns at this time. Encouragement and support provided and safety maintain. Q 15 min safety checks in place and video monitoring.

## 2016-07-13 NOTE — BH Assessment (Signed)
Spoke with patient regarding recommendation. Patient states that he understands and will likely follow up with [facility] once discharged today. Patient denies any questions or concerns at this time.    Davina PokeJoVea Kyana Aicher, LCSW Therapeutic Triage Specialist [facility] 07/13/2016 6:44 AM

## 2016-07-13 NOTE — ED Triage Notes (Signed)
Pt is very difficult to understand in triage due to slurring his words. Pt is requesting inpatient detox from alcohol. He denies being suicidal or wanting to harm himself. Pt endorses drinking 2.5 gallons of beer this evening.

## 2016-07-13 NOTE — ED Provider Notes (Signed)
[facility]PT Provider Note   CSN: [id number] Arrival date & time: 07/12/16  2335  By signing my name below, I, Majel Homer, attest that this documentation has been prepared under the direction and in the presence of Dione Booze, MD . Electronically Signed: Majel Homer, Scribe. 07/13/2016. 2:26 AM.  History   Chief Complaint Chief Complaint  Patient presents with  . Alcohol Problem   The history is provided by the patient. No language interpreter was used.   HPI Comments: Tim [name] is a 60 y.o. male with PMHx of alcoholism, anxiety, Hep C and mental health disorder, who presents to the Emergency Department requesting inpatient detox from EtOH. Pt reports he drank ~2.5 gallons of beer this evening. He states he was recently discharged from [facility] on [facility]/09/16 and began drinking again yesterday. He admits to drinking ~1.5 gallons of beer yesterday as well. Pt reports hx of DTs during EtOH withdrawal. He states he "is stuck in this hole and he doesn't know what to do to get out." He denies SI, HI and use of any recreational drugs. Pt reports smoking 0.5 packs of cigarettes a day.   Past Medical History:  Diagnosis Date  . Alcoholism (HCC)   . Anxiety   . COPD (chronic obstructive pulmonary disease) (HCC)   . Depression   . Hep C w/o coma, chronic (HCC)   . History of hiatal hernia    hernia rt groin  . Mental health disorder   . Scoliosis   . Thyroid disease     Patient Active Problem List   Diagnosis Date Noted  . Severe episode of recurrent major depressive disorder, without psychotic features (HCC) 07/08/2016  . MDD (major depressive disorder), recurrent severe, without psychosis (HCC) 05/23/2016  . Hepatitis C 05/23/2016  . Alcohol use disorder, severe, dependence (HCC) 11/05/2015  . Depression, major, recurrent, moderate (HCC) 11/05/2015  . GAD (generalized anxiety disorder) 11/05/2015  . Cellulitis of right lower extremity 03/29/2015    Past Surgical History:    Procedure Laterality Date  . SKIN CANCER EXCISION    . THYROID SURGERY      Home Medications    Prior to Admission medications   Medication Sig Start Date End Date Taking? Authorizing Provider  diclofenac sodium (VOLTAREN) 1 % GEL Apply 2 g topically 4 (four) times daily. 07/09/16  Yes Thermon Leyland, NP  FLUoxetine (PROZAC) 40 MG capsule Take 2 capsules (80 mg total) by mouth daily. 07/09/16  Yes Thermon Leyland, NP  gabapentin (NEURONTIN) 300 MG capsule Take 1 capsule (300 mg total) by mouth 3 (three) times daily. 07/09/16  Yes Thermon Leyland, NP  hydrOXYzine (ATARAX/VISTARIL) 25 MG tablet Take 1 tablet (25 mg total) by mouth every 6 (six) hours as needed for anxiety (or CIWA score </= 10). 05/27/16  Yes Adonis Brook, NP  traZODone (DESYREL) 50 MG tablet Take 1 tablet (50 mg total) by mouth at bedtime as needed for sleep. 05/27/16  Yes Adonis Brook, NP  nicotine (NICODERM CQ - DOSED IN MG/24 HOURS) 21 mg/24hr patch Place 1 patch (21 mg total) onto the skin daily. Patient not taking: Reported on 07/13/2016 07/09/16   Thermon Leyland, NP    Family History Family History  Problem Relation Age of Onset  . Dementia Mother     Social History Social History  Substance Use Topics  . Smoking status: Current Every Day Smoker    Packs/day: 0.50    Years: 40.00    Types: Cigarettes  .  Smokeless tobacco: Never Used  . Alcohol use Yes     Comment: drank 1/5 today.     Allergies   Review of patient's allergies indicates no known allergies.   Review of Systems Review of Systems  Constitutional: Negative for fever.  Psychiatric/Behavioral: Negative for suicidal ideas.       EtOH consumption    Physical Exam Updated Vital Signs BP 91/62 (BP Location: Right Arm)   Pulse 67   Temp 98 F (36.7 C) (Oral)   Resp 20   SpO2 91%   Physical Exam  Constitutional: He is oriented to person, place, and time. He appears well-developed and well-nourished.  HENT:  Head: Normocephalic and  atraumatic.  Eyes: EOM are normal. Pupils are equal, round, and reactive to light.  Neck: Normal range of motion. Neck supple. No JVD present.  Cardiovascular: Normal rate, regular rhythm and normal heart sounds.   No murmur heard. Pulmonary/Chest: Effort normal and breath sounds normal. He has no wheezes. He has no rales. He exhibits no tenderness.  Abdominal: Soft. Bowel sounds are normal. He exhibits no distension and no mass. There is no tenderness.  Musculoskeletal: Normal range of motion. He exhibits no edema.  Lymphadenopathy:    He has no cervical adenopathy.  Neurological: He is alert and oriented to person, place, and time. No cranial nerve deficit. He exhibits normal muscle tone. Coordination normal.  Clinically intoxicated.   Skin: Skin is warm and dry. No rash noted.  Psychiatric: He has a normal mood and affect. His behavior is normal. Judgment and thought content normal.  Nursing note and vitals reviewed.  ED Treatments / Results  Labs (all labs ordered are listed, but only abnormal results are displayed) Labs Reviewed  COMPREHENSIVE METABOLIC PANEL - Abnormal; Notable for the following:       Result Value   CO2 18 (*)    All other components within normal limits  ETHANOL - Abnormal; Notable for the following:    Alcohol, Ethyl (B) 207 (*)    All other components within normal limits  CBC - Abnormal; Notable for the following:    HCT 38.0 (*)    All other components within normal limits  URINE RAPID DRUG SCREEN, HOSP PERFORMED    Procedures Procedures (including critical care time)  Medications Ordered in ED Medications  alum & mag hydroxide-simeth (MAALOX/MYLANTA) 200-200-20 MG/5ML suspension 30 mL (not administered)  ondansetron (ZOFRAN) tablet 4 mg (not administered)  nicotine (NICODERM CQ - dosed in mg/24 hours) patch 21 mg (not administered)  zolpidem (AMBIEN) tablet 5 mg (not administered)  ibuprofen (ADVIL,MOTRIN) tablet 600 mg (not administered)    acetaminophen (TYLENOL) tablet 650 mg (not administered)  LORazepam (ATIVAN) tablet 0-4 mg (0 mg Oral Not Given 07/13/16 1610)    Followed by  LORazepam (ATIVAN) tablet 0-4 mg (not administered)  FLUoxetine (PROZAC) capsule 80 mg (not administered)  gabapentin (NEURONTIN) capsule 300 mg (not administered)  hydrOXYzine (ATARAX/VISTARIL) tablet 25 mg (not administered)  traZODone (DESYREL) tablet 50 mg (not administered)    DIAGNOSTIC STUDIES:  Oxygen Saturation is 91% on RA, low by my interpretation.    COORDINATION OF CARE:  2:20 AM Discussed treatment plan with pt at bedside and pt agreed to plan.  Initial Impression / Assessment and Plan / ED Course  I have reviewed the triage vital signs and the nursing notes.  Pertinent labs & imaging results that were available during my care of the patient were reviewed by me and considered in my  medical decision making (see chart for details).  Clinical Course   Alcohol intoxication and abuse which appear to be uncomplicated. No evidence of any psychiatric condition. Old records are reviewed and he does have a prior admissions to behavioral health hospital for depression-this was in August of this year. He had also just been discharged from behavioral health for alcohol abuse on September 23. Because of recent discharge for alcohol abuse, TTS consultation was obtained. After evaluation, they feel that he does not meet criteria for inpatient care and he is discharged with outpatient resources.  I personally performed the services described in this documentation, which was scribed in my presence. The recorded information has been reviewed and is accurate.   Final Clinical Impressions(s) / ED Diagnoses   Final diagnoses:  Alcohol intoxication, uncomplicated (HCC)  Alcohol use disorder [facility](HCC)    New Prescriptions New Prescriptions   No medications on file     Dione Boozeavid Barbara Keng, MD 07/13/16 [phone]

## 2016-09-21 ENCOUNTER — Encounter (HOSPITAL_COMMUNITY): Payer: Self-pay | Admitting: *Deleted

## 2016-09-21 ENCOUNTER — Emergency Department (HOSPITAL_COMMUNITY)
Admission: EM | Admit: 2016-09-21 | Discharge: 2016-09-22 | Disposition: A | Discharge status: Home / Self Care | Payer: Self-pay

## 2016-09-21 DIAGNOSIS — F17210 Nicotine dependence, cigarettes, uncomplicated: Secondary | ICD-10-CM | POA: Insufficient documentation

## 2016-09-21 DIAGNOSIS — F10129 Alcohol abuse with intoxication, unspecified: Secondary | ICD-10-CM | POA: Insufficient documentation

## 2016-09-21 DIAGNOSIS — F1010 Alcohol abuse, uncomplicated: Secondary | ICD-10-CM

## 2016-09-21 DIAGNOSIS — Z79899 Other long term (current) drug therapy: Secondary | ICD-10-CM | POA: Insufficient documentation

## 2016-09-21 DIAGNOSIS — F10920 Alcohol use, unspecified with intoxication, uncomplicated: Secondary | ICD-10-CM

## 2016-09-21 DIAGNOSIS — J449 Chronic obstructive pulmonary disease, unspecified: Secondary | ICD-10-CM | POA: Insufficient documentation

## 2016-09-21 NOTE — ED Notes (Signed)
Sandwich & sprite provided per Dr. Nicanor AlconPalumbo.

## 2016-09-21 NOTE — ED Triage Notes (Signed)
Pt brought in by [employer].  Pt requesting detox.

## 2016-09-21 NOTE — ED Provider Notes (Signed)
[facility]-EMERGENCY DEPT Provider Note   CSN: [id number] Arrival date & time: 09/21/16  2250  By signing my name below, I, Tim [name], attest that this documentation has been prepared under the direction and in the presence of Tim Lisenbee, MD . Electronically Signed: Majel HomerPeyton [name], Scribe. 09/21/2016. 11:20 PM.  History   Chief Complaint Chief Complaint  Patient presents with  . Alcohol Problem   The history is provided by the patient. No language interpreter was used.  Alcohol Intoxication  This is a chronic problem. The current episode started more than 1 week ago. The problem occurs constantly. The problem has been gradually worsening. Pertinent negatives include no chest pain. Nothing aggravates the symptoms. Nothing relieves the symptoms. He has tried nothing for the symptoms. The treatment provided no relief.   HPI Comments: Tim LandauJeffrey L Talbot is a 60 y.o. male with PMHx of alcoholism and mental health disorder, brought in by [facility] to the Emergency Department for an evaluation for EtOH intoxication. Pt reports he drank "four 40 ounces of beer" ~1 hour PTA. He notes he came to the ED tonight "for detox rehab."   Past Medical History:  Diagnosis Date  . Alcoholism (HCC)   . Anxiety   . COPD (chronic obstructive pulmonary disease) (HCC)   . Depression   . Hep C w/o coma, chronic (HCC)   . History of hiatal hernia    hernia rt groin  . Mental health disorder   . Scoliosis   . Thyroid disease     Patient Active Problem List   Diagnosis Date Noted  . Severe episode of recurrent major depressive disorder, without psychotic features (HCC) 07/08/2016  . MDD (major depressive disorder), recurrent severe, without psychosis (HCC) 05/23/2016  . Hepatitis C 05/23/2016  . Alcohol use disorder, severe, dependence (HCC) 11/05/2015  . Depression, major, recurrent, moderate (HCC) 11/05/2015  . GAD (generalized anxiety disorder) 11/05/2015  . Cellulitis of right lower extremity 03/29/2015    Past  Surgical History:  Procedure Laterality Date  . SKIN CANCER EXCISION    . THYROID SURGERY      Home Medications    Prior to Admission medications   Medication Sig Start Date End Date Taking? Authorizing Provider  diclofenac sodium (VOLTAREN) 1 % GEL Apply 2 g topically 4 (four) times daily. 07/09/16   Thermon LeylandLaura A Davis, NP  FLUoxetine (PROZAC) 40 MG capsule Take 2 capsules (80 mg total) by mouth daily. 07/09/16   Thermon LeylandLaura A Davis, NP  gabapentin (NEURONTIN) 300 MG capsule Take 1 capsule (300 mg total) by mouth 3 (three) times daily. 07/09/16   Thermon LeylandLaura A Davis, NP  hydrOXYzine (ATARAX/VISTARIL) 25 MG tablet Take 1 tablet (25 mg total) by mouth every 6 (six) hours as needed for anxiety (or CIWA score </= 10). 05/27/16   Adonis BrookSheila Agustin, NP  traZODone (DESYREL) 50 MG tablet Take 1 tablet (50 mg total) by mouth at bedtime as needed for sleep. 05/27/16   Adonis BrookSheila Agustin, NP    Family History Family History  Problem Relation Age of Onset  . Dementia Mother     Social History Social History  Substance Use Topics  . Smoking status: Current Every Day Smoker    Packs/day: 0.50    Years: 40.00    Types: Cigarettes  . Smokeless tobacco: Never Used  . Alcohol use Yes     Comment: drank 1/5 today.     Allergies   Patient has no known allergies.   Review of Systems Review of Systems  Constitutional: Negative for fever.  Cardiovascular: Negative for chest pain.  Psychiatric/Behavioral: Negative for agitation, confusion, self-injury, sleep disturbance and suicidal ideas. The patient is not nervous/anxious.   All other systems reviewed and are negative.  Physical Exam Updated Vital Signs BP 109/73 (BP Location: Left Arm)   Pulse 64   Temp 97.2 F (36.2 C) (Oral)   SpO2 93%   Physical Exam  Constitutional: He appears well-developed and well-nourished.  HENT:  Head: Normocephalic.  Mouth/Throat: Oropharynx is clear and moist. No oropharyngeal exudate.  Moist mucous membranes  Eyes:  Conjunctivae and EOM are normal. Pupils are equal, round, and reactive to light. Right eye exhibits no discharge. Left eye exhibits no discharge. No scleral icterus.  Neck: Normal range of motion. Neck supple. No JVD present. No tracheal deviation present.  Trachea is midline. No stridor or carotid bruits.  Cardiovascular: Normal rate, regular rhythm, normal heart sounds and intact distal pulses.   No murmur heard. Pulmonary/Chest: Effort normal and breath sounds normal. No stridor. No respiratory distress. He has no wheezes. He has no rales.  Lungs CTA bilaterally.  Abdominal: Soft. Bowel sounds are normal. He exhibits no distension. There is no tenderness. There is no rebound and no guarding.  Musculoskeletal: Normal range of motion. He exhibits no edema or tenderness.  All compartments are soft. No palpable cords.   Lymphadenopathy:    He has no cervical adenopathy.  Neurological: He is alert. He has normal reflexes. He displays normal reflexes.  DTRs intact.   Skin: Skin is warm and dry. Capillary refill takes less than 2 seconds.  Psychiatric: He has a normal mood and affect. His behavior is normal.  Nursing note and vitals reviewed.  ED Treatments / Results   Vitals:   09/21/16 2307  BP: 109/73  Pulse: 64  Temp: 97.2 F (36.2 C)   Procedures Procedures (including critical care time)   COORDINATION OF CARE:  11:11 PM Discussed treatment plan with pt at bedside and pt agreed to plan.  Po challenged and ambulated about the department without difficulty.   Final Clinical Impressions(s) / ED Diagnoses   Alcohol intoxication: just drank prior to arrival. Is awake and alert no SI or HI.  Is resting comfortably no signs of DTs. Is stable for discharge at this time.  States he wants help close to home will provide outpatient resources. Outpatient resources for detox. All questions answered to patient's satisfaction. Based on history and exam patient has been appropriately medically  screened and emergency conditions excluded. Patient is stable for discharge at this time. Follow up with your PMD for recheck in 2 days and strict return precautions given.    Cy BlamerApril Rain Friedt, MD 09/21/16 [phone]

## 2016-09-22 ENCOUNTER — Encounter (HOSPITAL_COMMUNITY): Payer: Self-pay | Admitting: Emergency Medicine

## 2016-09-22 MED ORDER — ADULT MULTIVITAMIN W/MINERALS CH
1.0000 | ORAL_TABLET | Freq: Every day | ORAL | Status: DC
  Administered 2016-09-22: 1 via ORAL
  Filled 2016-09-22: qty 1

## 2016-09-22 MED ORDER — LORAZEPAM 1 MG PO TABS: 0.0000 mg | ORAL_TABLET | Freq: Four times a day (QID) | ORAL | Status: DC

## 2016-09-22 MED ORDER — FOLIC ACID 1 MG PO TABS
1.0000 mg | ORAL_TABLET | Freq: Every day | ORAL | Status: DC
  Administered 2016-09-22: 1 mg via ORAL
  Filled 2016-09-22: qty 1

## 2016-09-22 MED ORDER — VITAMIN B-1 100 MG PO TABS: 100.0000 mg | ORAL_TABLET | Freq: Every day | ORAL | Status: DC

## 2016-09-22 MED ORDER — LORAZEPAM 1 MG PO TABS: 1.0000 mg | ORAL_TABLET | Freq: Four times a day (QID) | ORAL | Status: DC | PRN

## 2016-09-22 MED ORDER — HYDROXYZINE HCL 25 MG PO TABS: 25.0000 mg | ORAL_TABLET | Freq: Four times a day (QID) | ORAL | Status: DC | PRN

## 2016-09-22 MED ORDER — THIAMINE HCL 100 MG/ML IJ SOLN: 100.0000 mg | Freq: Every day | INTRAMUSCULAR | Status: DC

## 2016-09-22 MED ORDER — GABAPENTIN 300 MG PO CAPS
300.0000 mg | ORAL_CAPSULE | Freq: Three times a day (TID) | ORAL | Status: DC
  Administered 2016-09-22: 300 mg via ORAL
  Filled 2016-09-22: qty 1

## 2016-09-22 MED ORDER — LORAZEPAM 1 MG PO TABS: 0.0000 mg | ORAL_TABLET | Freq: Two times a day (BID) | ORAL | Status: DC

## 2016-09-22 MED ORDER — LORAZEPAM 2 MG/ML IJ SOLN: 1.0000 mg | Freq: Four times a day (QID) | INTRAMUSCULAR | Status: DC | PRN

## 2016-12-19 ENCOUNTER — Encounter (HOSPITAL_COMMUNITY): Payer: Self-pay | Admitting: Emergency Medicine

## 2016-12-19 ENCOUNTER — Emergency Department (HOSPITAL_COMMUNITY): Payer: Self-pay

## 2016-12-19 ENCOUNTER — Emergency Department (HOSPITAL_COMMUNITY)
Admission: EM | Admit: 2016-12-19 | Discharge: 2016-12-19 | Disposition: A | Discharge status: Home / Self Care | Payer: Self-pay | Attending: Emergency Medicine | Admitting: Emergency Medicine

## 2016-12-19 DIAGNOSIS — R52 Pain, unspecified: Secondary | ICD-10-CM

## 2016-12-19 DIAGNOSIS — J449 Chronic obstructive pulmonary disease, unspecified: Secondary | ICD-10-CM | POA: Insufficient documentation

## 2016-12-19 DIAGNOSIS — K0889 Other specified disorders of teeth and supporting structures: Secondary | ICD-10-CM | POA: Insufficient documentation

## 2016-12-19 DIAGNOSIS — F17210 Nicotine dependence, cigarettes, uncomplicated: Secondary | ICD-10-CM | POA: Insufficient documentation

## 2016-12-19 MED ORDER — PENICILLIN V POTASSIUM 250 MG PO TABS: 250.0000 mg | ORAL_TABLET | Freq: Four times a day (QID) | ORAL | 0 refills | Status: AC

## 2016-12-19 MED ORDER — IBUPROFEN 600 MG PO TABS: 600.0000 mg | ORAL_TABLET | Freq: Four times a day (QID) | ORAL | 0 refills | Status: DC | PRN

## 2016-12-19 MED ORDER — IBUPROFEN 400 MG PO TABS
600.0000 mg | ORAL_TABLET | Freq: Once | ORAL | Status: AC
  Administered 2016-12-19: 600 mg via ORAL
  Filled 2016-12-19: qty 1

## 2016-12-19 NOTE — ED Provider Notes (Signed)
[facility]-EMERGENCY DEPT Provider Note   CSN: [id number] Arrival date & time: 12/19/16  1157   By signing my name below, I, Bobbie Stack, attest that this documentation has been prepared under the direction and in the presence of Teressa Lower, NP. Electronically Signed: Bobbie Stack, Scribe. 12/19/16. 12:21 PM. History   Chief Complaint Chief Complaint  Patient presents with  . Jaw Pain   The history is provided by the patient. No language interpreter was used.  HPI Comments: Tim [name] is a 61 y.o. male brought in by ambulance, who presents to the Emergency Department complaining of constant jaw pain for the past couple of days. The patient states that he was putting some Orajel on a cavity that he has had quit some time. As he was applying the Orajel he accidentally coughed and noticed that his jaw locked up at that time. He states that he is unable to open his mouth completely. He reports pain with opening his mouth. He reports no other symptoms. He has taken some Advil with no significant relief.  Past Medical History:  Diagnosis Date  . Alcoholism (HCC)   . Anxiety   . COPD (chronic obstructive pulmonary disease) (HCC)   . Depression   . Hep C w/o coma, chronic (HCC)   . History of hiatal hernia    hernia rt groin  . Mental health disorder   . Scoliosis   . Thyroid disease     Patient Active Problem List   Diagnosis Date Noted  . Severe episode of recurrent major depressive disorder, without psychotic features (HCC) 07/08/2016  . MDD (major depressive disorder), recurrent severe, without psychosis (HCC) 05/23/2016  . Hepatitis C 05/23/2016  . Alcohol use disorder, severe, dependence (HCC) 11/05/2015  . Depression, major, recurrent, moderate (HCC) 11/05/2015  . GAD (generalized anxiety disorder) 11/05/2015  . Cellulitis of right lower extremity 03/29/2015    Past Surgical History:  Procedure Laterality Date  . SKIN CANCER EXCISION    . THYROID SURGERY           Home Medications    Prior to Admission medications   Medication Sig Start Date End Date Taking? Authorizing Provider  diclofenac sodium (VOLTAREN) 1 % GEL Apply 2 g topically 4 (four) times daily. Patient not taking: Reported on 09/21/2016 07/09/16   Thermon Leyland, NP  FLUoxetine (PROZAC) 40 MG capsule Take 2 capsules (80 mg total) by mouth daily. 07/09/16   Thermon Leyland, NP  gabapentin (NEURONTIN) 300 MG capsule Take 1 capsule (300 mg total) by mouth 3 (three) times daily. 07/09/16   Thermon Leyland, NP  hydrOXYzine (ATARAX/VISTARIL) 25 MG tablet Take 1 tablet (25 mg total) by mouth every 6 (six) hours as needed for anxiety (or CIWA score </= 10). 05/27/16   Adonis Brook, NP  traZODone (DESYREL) 50 MG tablet Take 1 tablet (50 mg total) by mouth at bedtime as needed for sleep. Patient not taking: Reported on 09/21/2016 05/27/16   Adonis Brook, NP    Family History Family History  Problem Relation Age of Onset  . Dementia Mother     Social History Social History  Substance Use Topics  . Smoking status: Current Every Day Smoker    Packs/day: 0.50    Years: 40.00    Types: Cigarettes  . Smokeless tobacco: Never Used  . Alcohol use Yes     Comment: drank 1/5 today.     Allergies   Patient has no known allergies.   Review  of Systems Review of Systems  Constitutional: Negative for chills and fever.  HENT: Positive for dental problem.        Positive for Jaw pain.  Respiratory: Negative for cough and shortness of breath.   Cardiovascular: Negative for chest pain.  All other systems reviewed and are negative.    Physical Exam Updated Vital Signs BP 109/65 (BP Location: Right Arm)   Pulse 79   Temp 97.9 F (36.6 C) (Oral)   Resp 17   Ht 6\' 2"  (1.88 m)   Wt 181 lb (82.1 kg)   SpO2 97%   BMI 23.24 kg/m   Physical Exam  Constitutional: He is oriented to person, place, and time. He appears well-developed and well-nourished.  HENT:  Head: Normocephalic.   Right Ear: External ear normal.  Left Ear: External ear normal.  Jaw non tender.no obvious deformity. Pt is unable to open completely. Multiple dental caries noted  Eyes: EOM are normal.  Neck: Normal range of motion.  Cardiovascular: Normal rate and normal heart sounds.   Pulmonary/Chest: Effort normal.  Abdominal: He exhibits no distension.  Musculoskeletal: Normal range of motion.  Neurological: He is alert and oriented to person, place, and time.  Psychiatric: He has a normal mood and affect.  Nursing note and vitals reviewed.    ED Treatments / Results  DIAGNOSTIC STUDIES: Oxygen Saturation is 97% on RA, normal by my interpretation.    COORDINATION OF CARE: 12:12 PM Discussed treatment plan with pt at bedside and pt agreed to plan. I will look at the patient's X-ray.  Labs (all labs ordered are listed, but only abnormal results are displayed) Labs Reviewed - No data to display  EKG  EKG Interpretation None       Radiology No results found.  Procedures Procedures (including critical care time)  Medications Ordered in ED Medications - No data to display   Initial Impression / Assessment and Plan / ED Course  I have reviewed the triage vital signs and the nursing notes.  Pertinent labs & imaging results that were available during my care of the patient were reviewed by me and considered in my medical decision making (see chart for details).     No acute bony abnormality noted. Discussed using antiiflammatories. Also going to give pcn for possible dental infection Final Clinical Impressions(s) / ED Diagnoses   Final diagnoses:  Pain    New Prescriptions New Prescriptions   No medications on file   I personally performed the services described in this documentation, which was scribed in my presence. The recorded information has been reviewed and is accurate.    Teressa LowerVrinda Lurae Hornbrook, NP 12/19/16 1408    Raeford RazorStephen Kohut, MD 12/26/16 1616

## 2016-12-19 NOTE — ED Triage Notes (Signed)
Per EMS: pt from home c/o inability to open his mouth on right side; pt with abscessed tooth on that area with purulent discharge

## 2017-01-04 ENCOUNTER — Encounter (HOSPITAL_COMMUNITY): Payer: Self-pay | Admitting: Emergency Medicine

## 2017-01-04 ENCOUNTER — Emergency Department (HOSPITAL_COMMUNITY)
Admission: EM | Admit: 2017-01-04 | Discharge: 2017-01-05 | Disposition: A | Discharge status: Home / Self Care | Payer: Self-pay | Attending: Emergency Medicine | Admitting: Emergency Medicine

## 2017-01-04 DIAGNOSIS — F17210 Nicotine dependence, cigarettes, uncomplicated: Secondary | ICD-10-CM | POA: Insufficient documentation

## 2017-01-04 DIAGNOSIS — F10229 Alcohol dependence with intoxication, unspecified: Secondary | ICD-10-CM | POA: Insufficient documentation

## 2017-01-04 DIAGNOSIS — Z79899 Other long term (current) drug therapy: Secondary | ICD-10-CM | POA: Insufficient documentation

## 2017-01-04 DIAGNOSIS — F10920 Alcohol use, unspecified with intoxication, uncomplicated: Secondary | ICD-10-CM

## 2017-01-04 DIAGNOSIS — J449 Chronic obstructive pulmonary disease, unspecified: Secondary | ICD-10-CM | POA: Insufficient documentation

## 2017-01-04 LAB — RAPID URINE DRUG SCREEN, HOSP PERFORMED
AMPHETAMINES: NOT DETECTED
BARBITURATES: NOT DETECTED
Benzodiazepines: NOT DETECTED
Cocaine: NOT DETECTED
OPIATES: NOT DETECTED
TETRAHYDROCANNABINOL: POSITIVE — AB

## 2017-01-04 LAB — COMPREHENSIVE METABOLIC PANEL
ALK PHOS: 72 U/L (ref 38–126)
ALT: 26 U/L (ref 17–63)
ANION GAP: 10 (ref 5–15)
AST: 34 U/L (ref 15–41)
Albumin: 4 g/dL (ref 3.5–5.0)
BUN: 7 mg/dL (ref 6–20)
CO2: 21 mmol/L — AB (ref 22–32)
CREATININE: 0.86 mg/dL (ref 0.61–1.24)
Calcium: 9.7 mg/dL (ref 8.9–10.3)
Chloride: 104 mmol/L (ref 101–111)
Glucose, Bld: 82 mg/dL (ref 65–99)
Potassium: 3.9 mmol/L (ref 3.5–5.1)
SODIUM: 135 mmol/L (ref 135–145)
Total Bilirubin: 0.4 mg/dL (ref 0.3–1.2)
Total Protein: 6.6 g/dL (ref 6.5–8.1)

## 2017-01-04 LAB — CBC
HCT: 48.1 % (ref 39.0–52.0)
HEMOGLOBIN: 16.6 g/dL (ref 13.0–17.0)
MCH: 31.8 pg (ref 26.0–34.0)
MCHC: 34.5 g/dL (ref 30.0–36.0)
MCV: 92.1 fL (ref 78.0–100.0)
PLATELETS: 252 10*3/uL (ref 150–400)
RBC: 5.22 MIL/uL (ref 4.22–5.81)
RDW: 14 % (ref 11.5–15.5)
WBC: 7.2 10*3/uL (ref 4.0–10.5)

## 2017-01-04 LAB — ACETAMINOPHEN LEVEL

## 2017-01-04 LAB — ETHANOL: ALCOHOL ETHYL (B): 168 mg/dL — AB (ref <5)

## 2017-01-04 LAB — SALICYLATE LEVEL

## 2017-01-04 MED ORDER — CHLORDIAZEPOXIDE HCL 25 MG PO CAPS: ORAL_CAPSULE | ORAL | 0 refills | Status: DC

## 2017-01-04 NOTE — ED Provider Notes (Signed)
[facility]-EMERGENCY DEPT Provider Note   CSN: [id number] Arrival date & time: 01/04/17  4098  By signing my name below, I, Octavia Heir, attest that this documentation has been prepared under the direction and in the presence of Azalia Bilis, MD.  Electronically Signed: Octavia Heir, ED Scribe. 01/04/17. 11:48 PM.    History   Chief Complaint Chief Complaint  Patient presents with  . Suicidal  . Alcohol Problem    The history is provided by the patient. No language interpreter was used.   HPI Comments: Tim [name] is a 61 y.o. male who has a PMhx of alcoholism, COPD, depression, Hepatitis C, scoliosis, MDD, GAD, and thyroid disease presents to the Emergency Department presenting for ETOH detox. He states he has had a few "40's" today, which is what he consumes every day. He also notes smoking marijuana today but notes no other drugs. Pt says he has been on an "alcohol binge" for the past several months. He expresses that his mental health has been messing with him lately. He has been off of his mental health medication for the past 3 weeks, which is prescribed for him at [facility]. Pt has a hx of SI in the past but has had no suicidal attempts. He denies SI, HI, visual hallucinations, and auditory hallucinations.     Past Medical History:  Diagnosis Date  . Alcoholism (HCC)   . Anxiety   . COPD (chronic obstructive pulmonary disease) (HCC)   . Depression   . Hep C w/o coma, chronic (HCC)   . History of hiatal hernia    hernia rt groin  . Mental health disorder   . Scoliosis   . Thyroid disease     Patient Active Problem List   Diagnosis Date Noted  . Severe episode of recurrent major depressive disorder, without psychotic features (HCC) 07/08/2016  . MDD (major depressive disorder), recurrent severe, without psychosis (HCC) 05/23/2016  . Hepatitis C 05/23/2016  . Alcohol use disorder, severe, dependence (HCC) 11/05/2015  . Depression, major, recurrent, moderate (HCC)  11/05/2015  . GAD (generalized anxiety disorder) 11/05/2015  . Cellulitis of right lower extremity 03/29/2015    Past Surgical History:  Procedure Laterality Date  . SKIN CANCER EXCISION    . THYROID SURGERY         Home Medications    Prior to Admission medications   Medication Sig Start Date End Date Taking? Authorizing Provider  diclofenac sodium (VOLTAREN) 1 % GEL Apply 2 g topically 4 (four) times daily. Patient not taking: Reported on 09/21/2016 07/09/16   Thermon Leyland, NP  FLUoxetine (PROZAC) 40 MG capsule Take 2 capsules (80 mg total) by mouth daily. 07/09/16   Thermon Leyland, NP  gabapentin (NEURONTIN) 300 MG capsule Take 1 capsule (300 mg total) by mouth 3 (three) times daily. 07/09/16   Thermon Leyland, NP  hydrOXYzine (ATARAX/VISTARIL) 25 MG tablet Take 1 tablet (25 mg total) by mouth every 6 (six) hours as needed for anxiety (or CIWA score </= 10). 05/27/16   Adonis Brook, NP  ibuprofen (ADVIL,MOTRIN) 600 MG tablet Take 1 tablet (600 mg total) by mouth every 6 (six) hours as needed. 12/19/16   Teressa Lower, NP  traZODone (DESYREL) 50 MG tablet Take 1 tablet (50 mg total) by mouth at bedtime as needed for sleep. Patient not taking: Reported on 09/21/2016 05/27/16   Adonis Brook, NP    Family History Family History  Problem Relation Age of Onset  . Dementia Mother  Social History Social History  Substance Use Topics  . Smoking status: Current Every Day Smoker    Packs/day: 0.50    Years: 40.00    Types: Cigarettes  . Smokeless tobacco: Never Used  . Alcohol use Yes     Comment: drank 1/5 today.     Allergies   Patient has no known allergies.   Review of Systems Review of Systems  A complete 10 system review of systems was obtained and all systems are negative except as noted in the HPI and PMH.   Physical Exam Updated Vital Signs BP 135/82   Pulse 70   Temp 97.5 F (36.4 C) (Oral)   Resp (!) 22   Ht 6' 2.5" (1.892 m)   Wt 178 lb (80.7 kg)    SpO2 96%   BMI 22.55 kg/m   Physical Exam  Constitutional: He is oriented to person, place, and time. He appears well-developed and well-nourished.  HENT:  Head: Normocephalic.  Eyes: EOM are normal.  Neck: Normal range of motion.  Pulmonary/Chest: Effort normal.  Abdominal: He exhibits no distension.  Musculoskeletal: Normal range of motion.  Neurological: He is alert and oriented to person, place, and time.  Psychiatric: He has a normal mood and affect. His behavior is normal. His speech is slurred. He expresses no homicidal and no suicidal ideation. He expresses no suicidal plans.  Nursing note and vitals reviewed.    ED Treatments / Results  DIAGNOSTIC STUDIES: Oxygen Saturation is 96% on RA, adequate by my interpretation.  COORDINATION OF CARE:  11:29 PM Discussed treatment plan with pt at bedside and pt agreed to plan.  Labs (all labs ordered are listed, but only abnormal results are displayed) Labs Reviewed  COMPREHENSIVE METABOLIC PANEL - Abnormal; Notable for the following:       Result Value   CO2 21 (*)    All other components within normal limits  ETHANOL - Abnormal; Notable for the following:    Alcohol, Ethyl (B) 168 (*)    All other components within normal limits  ACETAMINOPHEN LEVEL - Abnormal; Notable for the following:    Acetaminophen (Tylenol), Serum <10 (*)    All other components within normal limits  RAPID URINE DRUG SCREEN, HOSP PERFORMED - Abnormal; Notable for the following:    Tetrahydrocannabinol POSITIVE (*)    All other components within normal limits  SALICYLATE LEVEL  CBC    EKG  EKG Interpretation None       Radiology No results found.  Procedures Procedures (including critical care time)  Medications Ordered in ED Medications - No data to display   Initial Impression / Assessment and Plan / ED Course  I have reviewed the triage vital signs and the nursing notes.  Pertinent labs & imaging results that were available  during my care of the patient were reviewed by me and considered in my medical decision making (see chart for details).   patient reports ongoing alcohol abuse.  He reports no homicidal or suicidal ideation at this time.  No other complaints.  Home with Librium and outpatient resources  Final Clinical Impressions(s) / ED Diagnoses   Final diagnoses:  Alcoholic intoxication without complication (HCC)   I personally performed the services described in this documentation, which was scribed in my presence. The recorded information has been reviewed and is accurate.     New Prescriptions Discharge Medication List as of 01/04/2017 11:50 PM    START taking these medications   Details  chlordiazePOXIDE (  LIBRIUM) 25 MG capsule 50mg  PO TID x 1D, then 25-50mg  PO BID X 1D, then 25-50mg  PO QD X 1D, Print         Azalia Bilis, MD 01/05/17 [phone]

## 2017-01-04 NOTE — ED Triage Notes (Signed)
Patient reports suicidal ideation plans to shoot himself or walk in front of moving vehicles , denies hallucinations , pt. also requesting ETOH detox.

## 2017-01-04 NOTE — ED Notes (Signed)
Purple scrubs given to pt. , staffing office notified and security paged to wand pt.

## 2017-01-05 NOTE — ED Notes (Signed)
Pt verbalized understanding discharge instructions and denies any further needs or questions at this time. VS stable, ambulatory and steady gait.   

## 2017-02-23 ENCOUNTER — Emergency Department (HOSPITAL_COMMUNITY)
Admission: EM | Admit: 2017-02-23 | Discharge: 2017-02-24 | Disposition: A | Discharge status: Home / Self Care | Payer: Federal, State, Local not specified - Other | Attending: Emergency Medicine | Admitting: Emergency Medicine

## 2017-02-23 ENCOUNTER — Encounter (HOSPITAL_COMMUNITY): Payer: Self-pay | Admitting: Emergency Medicine

## 2017-02-23 DIAGNOSIS — R45851 Suicidal ideations: Secondary | ICD-10-CM | POA: Insufficient documentation

## 2017-02-23 DIAGNOSIS — Y999 Unspecified external cause status: Secondary | ICD-10-CM | POA: Insufficient documentation

## 2017-02-23 DIAGNOSIS — R45850 Homicidal ideations: Secondary | ICD-10-CM | POA: Insufficient documentation

## 2017-02-23 DIAGNOSIS — F1029 Alcohol dependence with unspecified alcohol-induced disorder: Secondary | ICD-10-CM | POA: Insufficient documentation

## 2017-02-23 DIAGNOSIS — S30861A Insect bite (nonvenomous) of abdominal wall, initial encounter: Secondary | ICD-10-CM | POA: Insufficient documentation

## 2017-02-23 DIAGNOSIS — J449 Chronic obstructive pulmonary disease, unspecified: Secondary | ICD-10-CM | POA: Insufficient documentation

## 2017-02-23 DIAGNOSIS — W57XXXA Bitten or stung by nonvenomous insect and other nonvenomous arthropods, initial encounter: Secondary | ICD-10-CM | POA: Insufficient documentation

## 2017-02-23 DIAGNOSIS — Y939 Activity, unspecified: Secondary | ICD-10-CM | POA: Insufficient documentation

## 2017-02-23 DIAGNOSIS — F1020 Alcohol dependence, uncomplicated: Secondary | ICD-10-CM | POA: Diagnosis present

## 2017-02-23 DIAGNOSIS — Y929 Unspecified place or not applicable: Secondary | ICD-10-CM | POA: Insufficient documentation

## 2017-02-23 DIAGNOSIS — F17210 Nicotine dependence, cigarettes, uncomplicated: Secondary | ICD-10-CM | POA: Insufficient documentation

## 2017-02-23 DIAGNOSIS — F10220 Alcohol dependence with intoxication, uncomplicated: Secondary | ICD-10-CM | POA: Diagnosis present

## 2017-02-23 DIAGNOSIS — Z79899 Other long term (current) drug therapy: Secondary | ICD-10-CM | POA: Insufficient documentation

## 2017-02-23 LAB — RAPID URINE DRUG SCREEN, HOSP PERFORMED
AMPHETAMINES: NOT DETECTED
BENZODIAZEPINES: NOT DETECTED
Barbiturates: NOT DETECTED
COCAINE: NOT DETECTED
Opiates: NOT DETECTED
Tetrahydrocannabinol: POSITIVE — AB

## 2017-02-23 LAB — COMPREHENSIVE METABOLIC PANEL
ALK PHOS: 70 U/L (ref 38–126)
ALT: 16 U/L — AB (ref 17–63)
AST: 23 U/L (ref 15–41)
Albumin: 3.8 g/dL (ref 3.5–5.0)
Anion gap: 10 (ref 5–15)
BUN: 11 mg/dL (ref 6–20)
CALCIUM: 9.6 mg/dL (ref 8.9–10.3)
CO2: 21 mmol/L — ABNORMAL LOW (ref 22–32)
CREATININE: 1 mg/dL (ref 0.61–1.24)
Chloride: 109 mmol/L (ref 101–111)
Glucose, Bld: 98 mg/dL (ref 65–99)
Potassium: 3.6 mmol/L (ref 3.5–5.1)
Sodium: 140 mmol/L (ref 135–145)
Total Bilirubin: 0.4 mg/dL (ref 0.3–1.2)
Total Protein: 6.4 g/dL — ABNORMAL LOW (ref 6.5–8.1)

## 2017-02-23 LAB — CBC
HCT: 43.8 % (ref 39.0–52.0)
Hemoglobin: 15.6 g/dL (ref 13.0–17.0)
MCH: 32 pg (ref 26.0–34.0)
MCHC: 35.6 g/dL (ref 30.0–36.0)
MCV: 89.8 fL (ref 78.0–100.0)
PLATELETS: 196 10*3/uL (ref 150–400)
RBC: 4.88 MIL/uL (ref 4.22–5.81)
RDW: 13.5 % (ref 11.5–15.5)
WBC: 6.3 10*3/uL (ref 4.0–10.5)

## 2017-02-23 LAB — SALICYLATE LEVEL

## 2017-02-23 LAB — ACETAMINOPHEN LEVEL: Acetaminophen (Tylenol), Serum: <10 ug/mL — ABNORMAL LOW (ref 10–30)

## 2017-02-23 LAB — ETHANOL: ALCOHOL ETHYL (B): 135 mg/dL — AB (ref <5)

## 2017-02-23 MED ORDER — VITAMIN B-1 100 MG PO TABS
100.0000 mg | ORAL_TABLET | Freq: Every day | ORAL | Status: DC
  Administered 2017-02-23 – 2017-02-24 (×2): 100 mg via ORAL
  Filled 2017-02-23 (×2): qty 1

## 2017-02-23 MED ORDER — LORAZEPAM 1 MG PO TABS
0.0000 mg | ORAL_TABLET | Freq: Four times a day (QID) | ORAL | Status: DC
  Administered 2017-02-23: 1 mg via ORAL
  Filled 2017-02-23: qty 1

## 2017-02-23 MED ORDER — ONDANSETRON HCL 4 MG PO TABS: 4.0000 mg | ORAL_TABLET | Freq: Three times a day (TID) | ORAL | Status: DC | PRN

## 2017-02-23 MED ORDER — ZOLPIDEM TARTRATE 5 MG PO TABS: 5.0000 mg | ORAL_TABLET | Freq: Every evening | ORAL | Status: DC | PRN

## 2017-02-23 MED ORDER — THIAMINE HCL 100 MG/ML IJ SOLN: 100.0000 mg | Freq: Every day | INTRAMUSCULAR | Status: DC

## 2017-02-23 MED ORDER — ACETAMINOPHEN 325 MG PO TABS: 650.0000 mg | ORAL_TABLET | ORAL | Status: DC | PRN

## 2017-02-23 MED ORDER — ALUM & MAG HYDROXIDE-SIMETH 200-200-20 MG/5ML PO SUSP: 30.0000 mL | ORAL | Status: DC | PRN

## 2017-02-23 MED ORDER — LORAZEPAM 1 MG PO TABS: 0.0000 mg | ORAL_TABLET | Freq: Two times a day (BID) | ORAL | Status: DC

## 2017-02-23 MED ORDER — IBUPROFEN 200 MG PO TABS: 600.0000 mg | ORAL_TABLET | Freq: Three times a day (TID) | ORAL | Status: DC | PRN
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
## 2017-02-23 NOTE — ED Notes (Signed)
TTS consult at bedside.  

## 2017-02-23 NOTE — ED Notes (Signed)
Patient changed into scrubs and belongings placed at nurses station. Pt wanded by security.

## 2017-02-23 NOTE — ED Triage Notes (Signed)
Patient reports alcohol abuse, no drugs and having SI/HI thoughts. Reports he "has the means to carry it out."

## 2017-02-23 NOTE — ED Provider Notes (Signed)
[facility]-EMERGENCY DEPT Provider Note   CSN: [id number] Arrival date & time: 02/23/17  1909   By signing my name below, I, Clarisse Gouge, attest that this documentation has been prepared under the direction and in the presence of [employer], PA-C. Electronically signed, Clarisse Gouge, ED Scribe. 02/23/17. 7:52 PM.   History   Chief Complaint Chief Complaint  Patient presents with  . Suicidal   The history is provided by the patient and medical records. No language interpreter was used.    Tim [name] is a 61 y.o. male with h/o chronic alcoholism, anxiety and depression, BIB GPD who presents to the Emergency Department with concern for homicidal ideations today. He reports "a means to carry it out" and presents to [facility] ED seeking help for this and alcohol abuse. Associated suicidal ideations and anxiety noted. No note of plan. He states he has been under increasing stress over the past 2-3 weeks. Pt denies illicit drug use. No other complaints at this time.   Past Medical History:  Diagnosis Date  . Alcoholism (HCC)   . Anxiety   . COPD (chronic obstructive pulmonary disease) (HCC)   . Depression   . Hep C w/o coma, chronic (HCC)   . History of hiatal hernia    hernia rt groin  . Mental health disorder   . Scoliosis   . Thyroid disease     Patient Active Problem List   Diagnosis Date Noted  . Severe episode of recurrent major depressive disorder, without psychotic features (HCC) 07/08/2016  . MDD (major depressive disorder), recurrent severe, without psychosis (HCC) 05/23/2016  . Hepatitis C 05/23/2016  . Alcohol use disorder, severe, dependence (HCC) 11/05/2015  . Depression, major, recurrent, moderate (HCC) 11/05/2015  . GAD (generalized anxiety disorder) 11/05/2015  . Cellulitis of right lower extremity 03/29/2015    Past Surgical History:  Procedure Laterality Date  . SKIN CANCER EXCISION    . THYROID SURGERY         Home Medications    Prior to Admission  medications   Medication Sig Start Date End Date Taking? Authorizing Provider  chlordiazePOXIDE (LIBRIUM) 25 MG capsule 50mg  PO TID x 1D, then 25-50mg  PO BID X 1D, then 25-50mg  PO QD X 1D 01/04/17   Azalia Bilis, MD  diclofenac sodium (VOLTAREN) 1 % GEL Apply 2 g topically 4 (four) times daily. Patient not taking: Reported on 09/21/2016 07/09/16   Thermon Leyland, NP  FLUoxetine (PROZAC) 40 MG capsule Take 2 capsules (80 mg total) by mouth daily. 07/09/16   Thermon Leyland, NP  gabapentin (NEURONTIN) 300 MG capsule Take 1 capsule (300 mg total) by mouth 3 (three) times daily. 07/09/16   Thermon Leyland, NP  hydrOXYzine (ATARAX/VISTARIL) 25 MG tablet Take 1 tablet (25 mg total) by mouth every 6 (six) hours as needed for anxiety (or CIWA score </= 10). 05/27/16   Adonis Brook, NP  ibuprofen (ADVIL,MOTRIN) 600 MG tablet Take 1 tablet (600 mg total) by mouth every 6 (six) hours as needed. 12/19/16   Teressa Lower, NP  traZODone (DESYREL) 50 MG tablet Take 1 tablet (50 mg total) by mouth at bedtime as needed for sleep. Patient not taking: Reported on 09/21/2016 05/27/16   Adonis Brook, NP    Family History Family History  Problem Relation Age of Onset  . Dementia Mother     Social History Social History  Substance Use Topics  . Smoking status: Current Every Day Smoker    Packs/day: 0.50  Years: 40.00    Types: Cigarettes  . Smokeless tobacco: Never Used  . Alcohol use Yes     Comment: drank 1/5 today.     Allergies   Patient has no known allergies.   Review of Systems Review of Systems  Psychiatric/Behavioral: Positive for suicidal ideas. The patient is nervous/anxious.        +homicidal ideation  All other systems reviewed and are negative.    Physical Exam Updated Vital Signs BP 111/84 (BP Location: Right Arm)   Pulse 60   Temp 97.8 F (36.6 C)   Resp 20   SpO2 100%   Physical Exam  Constitutional: He is oriented to person, place, and time. He appears well-developed  and well-nourished. No distress.  HENT:  Head: Normocephalic and atraumatic.  Mouth/Throat: Oropharynx is clear and moist.  Eyes: EOM are normal. Pupils are equal, round, and reactive to light.  Neck: Normal range of motion. Neck supple.  Cardiovascular: Normal rate, regular rhythm and normal heart sounds.  Exam reveals no gallop and no friction rub.   No murmur heard. Pulmonary/Chest: Effort normal and breath sounds normal. No respiratory distress. He has no wheezes.  Musculoskeletal: Normal range of motion.  Neurological: He is alert and oriented to person, place, and time. He exhibits normal muscle tone. Coordination normal.  Skin: Skin is warm and dry. Capillary refill takes less than 2 seconds. No rash noted. No erythema.  Psychiatric: He has a normal mood and affect. His speech is normal and behavior is normal. Thought content is not paranoid and not delusional. He expresses homicidal and suicidal ideation. He expresses suicidal plans.  Nursing note and vitals reviewed.    ED Treatments / Results  DIAGNOSTIC STUDIES: Oxygen Saturation is 100% on RA, NL by my interpretation.    COORDINATION OF CARE: 7:35 PM-Discussed next steps with pt. Pt verbalized understanding and is agreeable with the plan. Will order labs.   Labs (all labs ordered are listed, but only abnormal results are displayed) Labs Reviewed - No data to display  EKG  EKG Interpretation None       Radiology No results found.  Procedures Procedures (including critical care time)  Medications Ordered in ED Medications - No data to display   Initial Impression / Assessment and Plan / ED Course  I have reviewed the triage vital signs and the nursing notes.  Pertinent labs & imaging results that were available during my care of the patient were reviewed by me and considered in my medical decision making (see chart for details).     Will need TTS assessment for suicidal and homicidal ideations  Final  Clinical Impressions(s) / ED Diagnoses   Final diagnoses:  None    New Prescriptions New Prescriptions   No medications on file  I personally performed the services described in this documentation, which was scribed in my presence. The recorded information has been reviewed and is accurate.    Charlestine NightLawyer, Andrej Spagnoli, PA-C 02/23/17 Larena Sox2006    Zammit, Joseph, MD 02/23/17 [id number]

## 2017-02-23 NOTE — ED Notes (Signed)
Below order not completed by EW. 

## 2017-02-23 NOTE — ED Notes (Signed)
[facility] Expected date:  Expected time:  Means of arrival:  Comments: Tim [name]

## 2017-02-24 DIAGNOSIS — F1490 Cocaine use, unspecified, uncomplicated: Secondary | ICD-10-CM

## 2017-02-24 DIAGNOSIS — Z810 Family history of intellectual disabilities: Secondary | ICD-10-CM

## 2017-02-24 DIAGNOSIS — F17210 Nicotine dependence, cigarettes, uncomplicated: Secondary | ICD-10-CM

## 2017-02-24 DIAGNOSIS — F1290 Cannabis use, unspecified, uncomplicated: Secondary | ICD-10-CM

## 2017-02-24 DIAGNOSIS — F1020 Alcohol dependence, uncomplicated: Secondary | ICD-10-CM | POA: Diagnosis not present

## 2017-02-24 MED ORDER — DOXYCYCLINE HYCLATE 100 MG PO CAPS: 100.0000 mg | ORAL_CAPSULE | Freq: Two times a day (BID) | ORAL | 0 refills | Status: DC

## 2017-02-24 MED ORDER — GABAPENTIN 300 MG PO CAPS: 300.0000 mg | ORAL_CAPSULE | Freq: Three times a day (TID) | ORAL | 0 refills | Status: DC

## 2017-02-24 MED ORDER — GABAPENTIN 300 MG PO CAPS
300.0000 mg | ORAL_CAPSULE | Freq: Three times a day (TID) | ORAL | Status: DC
  Administered 2017-02-24: 300 mg via ORAL
  Filled 2017-02-24: qty 1

## 2017-02-24 NOTE — BH Assessment (Signed)
[facility] Assessment Progress Note  Per Mojeed Akintayo, MD, this pt does not require psychiatric hospitalization at this time.  Pt is to be discharged from [facility] with recommendation to follow up with [facility], his current outpatient provider.  This has been included in pt's discharge instructions.  Pt's nurse, Ashley, has been notified.  Fiorella Hanahan, MA Triage Specialist [phone]     

## 2017-02-24 NOTE — ED Notes (Signed)
CIWA assessment deferred d/t pt is currently sleeping.  

## 2017-02-24 NOTE — ED Notes (Signed)
Pt reports to this nurse that he removed a tick off his left side the other day and is now concerned of a rash. On assessment bumps noted on pt left side around hip area. This nurse notified EDP Dr. Rubin PayorPickering of pt concern, EDP reports will be on unit to assess pt.

## 2017-02-24 NOTE — ED Notes (Signed)
SBAR Report received from previous nurse. Pt received calm and visible on unit. Pt gave no meaningful answers to assessment questions related to current SI/ HI, A/V H, depression, anxiety, or pain at this time, but  appears otherwise stable and free of distress. Pt reminded of camera surveillance, q 15 min rounds, and rules of the milieu. Will continue to assess.

## 2017-02-24 NOTE — Discharge Instructions (Addendum)
For your ongoing mental health needs, you are advised to follow up with [facility].  If you do not currently have an appointment, new and returning patients are seen at their walk-in clinic.  Walk-in hours are Monday - Friday from 8:00 am - 3:00 pm.  Walk-in patients are seen on a first come, first served basis.  Try to arrive as early as possible for he best chance of being seen the same day: ° °     [facility] °     [address] °     [place], [place] [address] °     [phone] °

## 2017-02-24 NOTE — ED Notes (Signed)
Pt d/c home per MD order. Discharge summary reviewed with pt. Pt verbalizes understanding. RX provided. Pt denies SI/HI/AVH. Bus pass provided per pt request. Personal property returned to pt. Pt signed e-signature. Pt ambulatory off unit with [facility].

## 2017-02-24 NOTE — Consult Note (Signed)
[place] Psychiatry Consult   Reason for Consult:  Alcohol abuse with suicidal ideations Referring Physician:  EDP Patient Identification: JARREAU CALLANAN MRN:  [id number] Principal Diagnosis: Alcohol use disorder, severe, dependence ([place]) Diagnosis:   Patient Active Problem List   Diagnosis Date Noted  . Alcohol use disorder, severe, dependence ([place]) [F10.20] 11/05/2015    Priority: High  . Severe episode of recurrent major depressive disorder, without psychotic features ([place]) [F33.2] 07/08/2016  . MDD (major depressive disorder), recurrent severe, without psychosis ([place]) [F33.2] 05/23/2016  . Hepatitis C [B19.20] 05/23/2016  . Depression, major, recurrent, moderate ([place]) [F33.1] 11/05/2015  . GAD (generalized anxiety disorder) [F41.1] 11/05/2015  . Cellulitis of right lower extremity [L03.115] 03/29/2015    Total Time spent with patient: 45 minutes  Subjective:   GUERIN LASHOMB is a 61 y.o. male patient does not warrant admission.  HPI:  61 yo male who came to the ED after drinking and having suicidal ideations.  Today, he reports he feels "better" after sleeping and eating.  Denies suicidal/homicidal ideations, hallucinations, and withdrawal symptoms.  Gabapentin will be given to prevent withdrawal symptoms complications, agreeable to this as he wants to discharge.  Receives outpatient care at [facility].  Past Psychiatric History: depression, substance abuse  Risk to Self: None Risk to Others: None Prior Inpatient Therapy: Prior Inpatient Therapy: Yes Prior Therapy Dates: Sept & Aug of 2017 Prior Therapy Facilty/Provider(s): [facility] Reason for Treatment: SI/SA Prior Outpatient Therapy: Prior Outpatient Therapy: No Prior Therapy Dates: More than 5 years Prior Therapy Facilty/Provider(s): [facility] Reason for Treatment: medication management Does patient have an ACCT team?: No Does patient have Intensive In-House Services?  : No Does patient have [facility] services? :  Yes Does patient have P4CC services?: No  Past Medical History:  Past Medical History:  Diagnosis Date  . Alcoholism ([place])   . Anxiety   . COPD (chronic obstructive pulmonary disease) ([place])   . Depression   . Hep C w/o coma, chronic ([place])   . History of hiatal hernia    hernia rt groin  . Mental health disorder   . Scoliosis   . Thyroid disease     Past Surgical History:  Procedure Laterality Date  . SKIN CANCER EXCISION    . THYROID SURGERY     Family History:  Family History  Problem Relation Age of Onset  . Dementia Mother    Family Psychiatric  History: unknown Social History:  History  Alcohol Use  . Yes    Comment: drank 1/5 today.     History  Drug Use  . Types: Cocaine, Marijuana    Comment: also states will take pain pills every now and then, last use of cocaine and ETOH today at 1400    Social History   Social History  . Marital status: Single    Spouse name: N/A  . Number of children: N/A  . Years of education: N/A   Social History Main Topics  . Smoking status: Current Every Day Smoker    Packs/day: 0.50    Years: 40.00    Types: Cigarettes  . Smokeless tobacco: Never Used  . Alcohol use Yes     Comment: drank 1/5 today.  . Drug use: Yes    Types: Cocaine, Marijuana     Comment: also states will take pain pills every now and then, last use of cocaine and ETOH today at 1400  . Sexual activity: No   Other Topics Concern  . None  Social History Narrative   ** Merged History Encounter **       Additional Social History:    Allergies:  No Known Allergies  Labs:  Results for orders placed or performed during the hospital encounter of 02/23/17 (from the past 48 hour(s))  Rapid urine drug screen (hospital performed)     Status: Abnormal   Collection Time: 02/23/17  9:16 PM  Result Value Ref Range   Opiates NONE DETECTED NONE DETECTED   Cocaine NONE DETECTED NONE DETECTED   Benzodiazepines NONE DETECTED NONE DETECTED   Amphetamines  NONE DETECTED NONE DETECTED   Tetrahydrocannabinol POSITIVE (A) NONE DETECTED   Barbiturates NONE DETECTED NONE DETECTED    Comment:        DRUG SCREEN FOR MEDICAL PURPOSES ONLY.  IF CONFIRMATION IS NEEDED FOR ANY PURPOSE, NOTIFY LAB WITHIN 5 DAYS.        LOWEST DETECTABLE LIMITS FOR URINE DRUG SCREEN Drug Class       Cutoff (ng/mL) Amphetamine      1000 Barbiturate      200 Benzodiazepine   510 Tricyclics       258 Opiates          300 Cocaine          300 THC              50   Comprehensive metabolic panel     Status: Abnormal   Collection Time: 02/23/17 11:10 PM  Result Value Ref Range   Sodium 140 135 - 145 mmol/L   Potassium 3.6 3.5 - 5.1 mmol/L   Chloride 109 101 - 111 mmol/L   CO2 21 (L) 22 - 32 mmol/L   Glucose, Bld 98 65 - 99 mg/dL   BUN 11 6 - 20 mg/dL   Creatinine, Ser 1.00 0.61 - 1.24 mg/dL   Calcium 9.6 8.9 - 10.3 mg/dL   Total Protein 6.4 (L) 6.5 - 8.1 g/dL   Albumin 3.8 3.5 - 5.0 g/dL   AST 23 15 - 41 U/L   ALT 16 (L) 17 - 63 U/L   Alkaline Phosphatase 70 38 - 126 U/L   Total Bilirubin 0.4 0.3 - 1.2 mg/dL   GFR calc non Af Amer >60 >60 mL/min   GFR calc Af Amer >60 >60 mL/min    Comment: (NOTE) The eGFR has been calculated using the CKD EPI equation. This calculation has not been validated in all clinical situations. eGFR's persistently <60 mL/min signify possible Chronic Kidney Disease.    Anion gap 10 5 - 15  Ethanol     Status: Abnormal   Collection Time: 02/23/17 11:10 PM  Result Value Ref Range   Alcohol, Ethyl (B) 135 (H) <5 mg/dL    Comment:        LOWEST DETECTABLE LIMIT FOR SERUM ALCOHOL IS 5 mg/dL FOR MEDICAL PURPOSES ONLY   Salicylate level     Status: None   Collection Time: 02/23/17 11:10 PM  Result Value Ref Range   Salicylate Lvl <5.2 2.8 - 30.0 mg/dL  Acetaminophen level     Status: Abnormal   Collection Time: 02/23/17 11:10 PM  Result Value Ref Range   Acetaminophen (Tylenol), Serum <10 (L) 10 - 30 ug/mL    Comment:         THERAPEUTIC CONCENTRATIONS VARY SIGNIFICANTLY. A RANGE OF 10-30 ug/mL MAY BE AN EFFECTIVE CONCENTRATION FOR MANY PATIENTS. HOWEVER, SOME ARE BEST TREATED AT CONCENTRATIONS OUTSIDE THIS RANGE. ACETAMINOPHEN CONCENTRATIONS >150 ug/mL AT 4 HOURS  AFTER INGESTION AND >50 ug/mL AT 12 HOURS AFTER INGESTION ARE OFTEN ASSOCIATED WITH TOXIC REACTIONS.   cbc     Status: None   Collection Time: 02/23/17 11:10 PM  Result Value Ref Range   WBC 6.3 4.0 - 10.5 K/uL   RBC 4.88 4.22 - 5.81 MIL/uL   Hemoglobin 15.6 13.0 - 17.0 g/dL   HCT 43.8 39.0 - 52.0 %   MCV 89.8 78.0 - 100.0 fL   MCH 32.0 26.0 - 34.0 pg   MCHC 35.6 30.0 - 36.0 g/dL   RDW 13.5 11.5 - 15.5 %   Platelets 196 150 - 400 K/uL    Current Facility-Administered Medications  Medication Dose Route Frequency Provider Last Rate Last Dose  . acetaminophen (TYLENOL) tablet 650 mg  650 mg Oral Q4H PRN Lawyer, Christopher, PA-C      . alum & mag hydroxide-simeth (MAALOX/MYLANTA) 200-200-20 MG/5ML suspension 30 mL  30 mL Oral PRN Lawyer, Christopher, PA-C      . ibuprofen (ADVIL,MOTRIN) tablet 600 mg  600 mg Oral Q8H PRN Lawyer, Harrell Gave, PA-C      . LORazepam (ATIVAN) tablet 0-4 mg  0-4 mg Oral Q6H Lawyer, Christopher, PA-C   1 mg at 02/23/17 2100   Followed by  . [START ON 02/25/2017] LORazepam (ATIVAN) tablet 0-4 mg  0-4 mg Oral Q12H Lawyer, Christopher, PA-C      . ondansetron (ZOFRAN) tablet 4 mg  4 mg Oral Q8H PRN Lawyer, Christopher, PA-C      . thiamine (VITAMIN B-1) tablet 100 mg  100 mg Oral Daily Dalia Heading, PA-C   100 mg at 02/24/17 5681   Or  . thiamine (B-1) injection 100 mg  100 mg Intravenous Daily Dalia Heading, PA-C       Current Outpatient Prescriptions  Medication Sig Dispense Refill  . FLUoxetine (PROZAC) 40 MG capsule Take 2 capsules (80 mg total) by mouth daily. 60 capsule 0  . gabapentin (NEURONTIN) 300 MG capsule Take 1 capsule (300 mg total) by mouth 3 (three) times daily. 90 capsule 0  .  hydrOXYzine (ATARAX/VISTARIL) 25 MG tablet Take 1 tablet (25 mg total) by mouth every 6 (six) hours as needed for anxiety (or CIWA score </= 10). 30 tablet 0  . OLANZapine (ZYPREXA) 20 MG tablet Take 20 mg by mouth at bedtime.    . chlordiazePOXIDE (LIBRIUM) 25 MG capsule 77m PO TID x 1D, then 25-559mPO BID X 1D, then 25-5040mO QD X 1D (Patient not taking: Reported on 02/23/2017) 10 capsule 0  . diclofenac sodium (VOLTAREN) 1 % GEL Apply 2 g topically 4 (four) times daily. (Patient not taking: Reported on 09/21/2016) 1 Tube 0  . ibuprofen (ADVIL,MOTRIN) 600 MG tablet Take 1 tablet (600 mg total) by mouth every 6 (six) hours as needed. (Patient not taking: Reported on 02/23/2017) 30 tablet 0  . traZODone (DESYREL) 50 MG tablet Take 1 tablet (50 mg total) by mouth at bedtime as needed for sleep. (Patient not taking: Reported on 09/21/2016) 30 tablet 0    Musculoskeletal: Strength & Muscle Tone: within normal limits Gait & Station: normal Patient leans: N/A  Psychiatric Specialty Exam: Physical Exam  Constitutional: He appears well-developed and well-nourished.  HENT:  Head: Normocephalic.  Neck: Normal range of motion.  Respiratory: Effort normal.  Musculoskeletal: Normal range of motion.  Neurological: He is alert.  Psychiatric: He has a normal mood and affect. His speech is normal and behavior is normal. Judgment and thought content normal. Cognition and  memory are normal.    Review of Systems  Psychiatric/Behavioral: Positive for substance abuse.  All other systems reviewed and are negative.   Blood pressure 124/76, pulse 73, temperature 98.5 F (36.9 C), temperature source Oral, resp. rate 17, SpO2 96 %.There is no height or weight on file to calculate BMI.  General Appearance: Casual  Eye Contact:  Good  Speech:  Normal Rate  Volume:  Normal  Mood:  Euthymic  Affect:  Congruent  Thought Process:  Coherent and Descriptions of Associations: Intact  Orientation:  Full (Time,  Place, and Person)  Thought Content:  WDL and Logical  Suicidal Thoughts:  No  Homicidal Thoughts:  No  Memory:  Immediate;   Good Recent;   Good Remote;   Good  Judgement:  Fair  Insight:  Fair  Psychomotor Activity:  Normal  Concentration:  Concentration: Good and Attention Span: Good  Recall:  Good  Fund of Knowledge:  Good  Language:  Good  Akathisia:  No  Handed:  Right  AIMS (if indicated):     Assets:  Leisure Time Physical Health Resilience Social Support  ADL's:  Intact  Cognition:  WNL  Sleep:        Treatment Plan Summary: Daily contact with patient to assess and evaluate symptoms and progress in treatment, Medication management and Plan alcohol abuse with alcohol induced mood disorder:  -Crisis stabilization -Medication management:  Ativan alcohol protocol started for detox  -Individual and substance abuse counseling -Gabapentin Rx  Disposition: No evidence of imminent risk to self or others at present.    Waylan Boga, NP 02/24/2017 11:40 AM  Patient seen face-to-face for psychiatric evaluation, chart reviewed and case discussed with the physician extender and developed treatment plan. Reviewed the information documented and agree with the treatment plan. Corena Pilgrim, MD

## 2017-02-24 NOTE — ED Provider Notes (Signed)
  Physical Exam  BP 124/76 (BP Location: Left Arm)   Pulse 73   Temp 98.5 F (36.9 C) (Oral)   Resp 17   SpO2 96%   Physical Exam  ED Course  Procedures  MDM Called to see patient for rash after tick bite. Reportedly had a take embedded for a day or 2 that he pulled off. States [place]. In his left groin area there are several raised 1-1/2 cm erythematous areas. Pruritic. Some of been scratched. On his lower abdomen there is a larger area with surrounding erythema around 3 cm. These look more like insect bites as opposed to clearly tick borne illness however with the redness on the proximal abdominal lesion and the fact that take was embedded for a day or 2 will give some doxycycline. Discharge home.       Benjiman CorePickering, Stephanie Mcglone, MD 02/24/17 1213

## 2017-02-24 NOTE — [facility] Suicide Risk Assessment (Signed)
Suicide Risk Assessment  Discharge Assessment   [facility]   Principal Problem: Alcohol use disorder, severe, dependence [facility](HCC) Discharge Diagnoses:  Patient Active Problem List   Diagnosis Date Noted  . Alcohol use disorder, severe, dependence ([facility]) [F10.20] 11/05/2015    Priority: High  . Severe episode of recurrent major depressive disorder, without psychotic features ([facility]) [F33.2] 07/08/2016  . MDD (major depressive disorder), recurrent severe, without psychosis ([facility]) [F33.2] 05/23/2016  . Hepatitis C [B19.20] 05/23/2016  . 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 GAD (generalized anxiety disorder) [F41.1] 11/05/2015  . Cellulitis of right lower extremity [L03.115] 03/29/2015    Total Time spent with patient: 45 minutes  Musculoskeletal: Strength & Muscle Tone: within normal limits Gait & Station: normal Patient leans: N/A  Psychiatric Specialty Exam: Physical Exam  Constitutional: He appears well-developed and well-nourished.  HENT:  Head: Normocephalic.  Neck: Normal range of motion.  Respiratory: Effort normal.  Musculoskeletal: Normal range of motion.  Neurological: He is alert.  Psychiatric: He has a normal mood and affect. His speech is normal and behavior is normal. Judgment and thought content normal. Cognition and memory are normal.    Review of Systems  Psychiatric/Behavioral: Positive for substance abuse.  All other systems reviewed and are negative.   Blood pressure 124/76, pulse 73, temperature 98.5 F (36.9 C), temperature source Oral, resp. rate 17, SpO2 96 %.There is no height or weight on file to calculate BMI.  General Appearance: Casual  Eye Contact:  Good  Speech:  Normal Rate  Volume:  Normal  Mood:  Euthymic  Affect:  Congruent  Thought Process:  Coherent and Descriptions of Associations: Intact  Orientation:  Full (Time, Place, and Person)  Thought Content:  WDL and Logical  Suicidal  Thoughts:  No  Homicidal Thoughts:  No  Memory:  Immediate;   Good Recent;   Good Remote;   Good  Judgement:  Fair  Insight:  Fair  Psychomotor Activity:  Normal  Concentration:  Concentration: Good and Attention Span: Good  Recall:  Good  Fund of Knowledge:  Good  Language:  Good  Akathisia:  No  Handed:  Right  AIMS (if indicated):     Assets:  Leisure Time Physical Health Resilience Social Support  ADL's:  Intact  Cognition:  WNL  Sleep:      Mental Status Per Nursing Assessment::   On Admission:   alcohol abuse with suicidal ideations  Demographic Factors:  Male and Caucasian  Loss Factors: NA  Historical Factors: NA  Risk Reduction Factors:   Sense of responsibility to family, Living with another person, especially a relative, Positive social support and Positive therapeutic relationship  Continued Clinical Symptoms:  None  Cognitive Features That Contribute To Risk:  None    Suicide Risk:  Minimal: No identifiable suicidal ideation.  Patients presenting with no risk factors but with morbid ruminations; may be classified as minimal risk based on the severity of the depressive symptoms    Plan Of Care/Follow-up recommendations:  Activity:  as tolerated Diet:  heart healthy diet  Amoy Steeves, NP 02/24/2017, 11:45 AM

## 2017-04-10 ENCOUNTER — Emergency Department (HOSPITAL_COMMUNITY)
Admission: EM | Admit: 2017-04-10 | Discharge: 2017-04-12 | Disposition: A | Discharge status: Home / Self Care | Payer: Self-pay | Attending: Emergency Medicine | Admitting: Emergency Medicine

## 2017-04-10 DIAGNOSIS — R45851 Suicidal ideations: Secondary | ICD-10-CM | POA: Insufficient documentation

## 2017-04-10 DIAGNOSIS — F1020 Alcohol dependence, uncomplicated: Secondary | ICD-10-CM | POA: Diagnosis present

## 2017-04-10 DIAGNOSIS — F10220 Alcohol dependence with intoxication, uncomplicated: Secondary | ICD-10-CM | POA: Diagnosis present

## 2017-04-10 DIAGNOSIS — J449 Chronic obstructive pulmonary disease, unspecified: Secondary | ICD-10-CM | POA: Insufficient documentation

## 2017-04-10 DIAGNOSIS — T300 Burn of unspecified body region, unspecified degree: Secondary | ICD-10-CM

## 2017-04-10 DIAGNOSIS — F332 Major depressive disorder, recurrent severe without psychotic features: Secondary | ICD-10-CM | POA: Insufficient documentation

## 2017-04-10 DIAGNOSIS — Z7289 Other problems related to lifestyle: Secondary | ICD-10-CM

## 2017-04-10 DIAGNOSIS — F331 Major depressive disorder, recurrent, moderate: Secondary | ICD-10-CM | POA: Diagnosis present

## 2017-04-10 DIAGNOSIS — F32A Depression, unspecified: Secondary | ICD-10-CM

## 2017-04-10 DIAGNOSIS — F17210 Nicotine dependence, cigarettes, uncomplicated: Secondary | ICD-10-CM | POA: Insufficient documentation

## 2017-04-10 DIAGNOSIS — F329 Major depressive disorder, single episode, unspecified: Secondary | ICD-10-CM

## 2017-04-10 DIAGNOSIS — Z79899 Other long term (current) drug therapy: Secondary | ICD-10-CM | POA: Insufficient documentation

## 2017-04-10 LAB — COMPREHENSIVE METABOLIC PANEL
ALBUMIN: 4.1 g/dL (ref 3.5–5.0)
ALT: 16 U/L — ABNORMAL LOW (ref 17–63)
ANION GAP: 9 (ref 5–15)
AST: 20 U/L (ref 15–41)
Alkaline Phosphatase: 77 U/L (ref 38–126)
BILIRUBIN TOTAL: 0.4 mg/dL (ref 0.3–1.2)
BUN: 13 mg/dL (ref 6–20)
CALCIUM: 9.7 mg/dL (ref 8.9–10.3)
CO2: 24 mmol/L (ref 22–32)
Chloride: 107 mmol/L (ref 101–111)
Creatinine, Ser: 1.04 mg/dL (ref 0.61–1.24)
GFR calc non Af Amer: >60 mL/min (ref >60)
GLUCOSE: 94 mg/dL (ref 65–99)
POTASSIUM: 3.6 mmol/L (ref 3.5–5.1)
Sodium: 140 mmol/L (ref 135–145)
TOTAL PROTEIN: 7.1 g/dL (ref 6.5–8.1)

## 2017-04-10 LAB — ETHANOL: Alcohol, Ethyl (B): 104 mg/dL — ABNORMAL HIGH (ref <5)

## 2017-04-10 LAB — CBC
HEMATOCRIT: 45.3 % (ref 39.0–52.0)
Hemoglobin: 16 g/dL (ref 13.0–17.0)
MCH: 31.4 pg (ref 26.0–34.0)
MCHC: 35.3 g/dL (ref 30.0–36.0)
MCV: 88.8 fL (ref 78.0–100.0)
Platelets: 208 10*3/uL (ref 150–400)
RBC: 5.1 MIL/uL (ref 4.22–5.81)
RDW: 13.7 % (ref 11.5–15.5)
WBC: 10 10*3/uL (ref 4.0–10.5)

## 2017-04-10 LAB — ACETAMINOPHEN LEVEL: Acetaminophen (Tylenol), Serum: <10 ug/mL — ABNORMAL LOW (ref 10–30)

## 2017-04-10 LAB — SALICYLATE LEVEL

## 2017-04-10 NOTE — ED Notes (Signed)
Bed: [facility] Expected date:  Expected time:  Means of arrival:  Comments: 
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
## 2017-04-11 MED ORDER — LORAZEPAM 2 MG/ML IJ SOLN: 0.0000 mg | Freq: Two times a day (BID) | INTRAMUSCULAR | Status: DC

## 2017-04-11 MED ORDER — ACETAMINOPHEN 325 MG PO TABS: 650.0000 mg | ORAL_TABLET | ORAL | Status: DC | PRN

## 2017-04-11 MED ORDER — FLUOXETINE HCL 20 MG PO CAPS
80.0000 mg | ORAL_CAPSULE | Freq: Every day | ORAL | Status: DC
  Administered 2017-04-11 – 2017-04-12 (×2): 80 mg via ORAL
  Filled 2017-04-11 (×2): qty 4

## 2017-04-11 MED ORDER — GABAPENTIN 300 MG PO CAPS
300.0000 mg | ORAL_CAPSULE | Freq: Three times a day (TID) | ORAL | Status: DC
  Administered 2017-04-11 – 2017-04-12 (×4): 300 mg via ORAL
  Filled 2017-04-11 (×4): qty 1

## 2017-04-11 MED ORDER — LORAZEPAM 1 MG PO TABS: 0.0000 mg | ORAL_TABLET | Freq: Two times a day (BID) | ORAL | Status: DC

## 2017-04-11 MED ORDER — THIAMINE HCL 100 MG/ML IJ SOLN
100.0000 mg | Freq: Every day | INTRAMUSCULAR | Status: DC
  Administered 2017-04-12: 100 mg via INTRAVENOUS
  Filled 2017-04-11: qty 2

## 2017-04-11 MED ORDER — LORAZEPAM 2 MG/ML IJ SOLN: 0.0000 mg | Freq: Four times a day (QID) | INTRAMUSCULAR | Status: DC

## 2017-04-11 MED ORDER — ALUM & MAG HYDROXIDE-SIMETH 200-200-20 MG/5ML PO SUSP: 30.0000 mL | Freq: Four times a day (QID) | ORAL | Status: DC | PRN

## 2017-04-11 MED ORDER — OLANZAPINE 10 MG PO TABS
20.0000 mg | ORAL_TABLET | Freq: Every day | ORAL | Status: DC
  Administered 2017-04-11 (×2): 20 mg via ORAL
  Filled 2017-04-11 (×2): qty 2

## 2017-04-11 MED ORDER — NICOTINE 21 MG/24HR TD PT24
21.0000 mg | MEDICATED_PATCH | Freq: Every day | TRANSDERMAL | Status: DC
  Administered 2017-04-12: 21 mg via TRANSDERMAL
  Filled 2017-04-11 (×2): qty 1

## 2017-04-11 MED ORDER — LORAZEPAM 1 MG PO TABS
0.0000 mg | ORAL_TABLET | Freq: Four times a day (QID) | ORAL | Status: DC
  Administered 2017-04-11: 1 mg via ORAL
  Filled 2017-04-11: qty 1

## 2017-04-11 MED ORDER — TRAZODONE HCL 100 MG PO TABS: 100.0000 mg | ORAL_TABLET | Freq: Every evening | ORAL | Status: DC | PRN

## 2017-04-11 MED ORDER — ZOLPIDEM TARTRATE 5 MG PO TABS: 5.0000 mg | ORAL_TABLET | Freq: Every evening | ORAL | Status: DC | PRN

## 2017-04-11 MED ORDER — VITAMIN B-1 100 MG PO TABS
100.0000 mg | ORAL_TABLET | Freq: Every day | ORAL | Status: DC
  Administered 2017-04-11: 100 mg via ORAL
  Filled 2017-04-11 (×2): qty 1

## 2017-04-11 MED ORDER — SILVER SULFADIAZINE 1 % EX CREA
TOPICAL_CREAM | Freq: Two times a day (BID) | CUTANEOUS | Status: DC
  Administered 2017-04-11 – 2017-04-12 (×3): via TOPICAL
  Filled 2017-04-11: qty 50

## 2017-04-11 MED ORDER — ONDANSETRON HCL 4 MG PO TABS: 4.0000 mg | ORAL_TABLET | Freq: Three times a day (TID) | ORAL | Status: DC | PRN
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
## 2017-04-11 NOTE — ED Notes (Signed)
Pt A&O x 3, no distress noted, calm & cooperative. Resting at present.  Monitoring for safety, Q 15 min checks in effect. 

## 2017-04-11 NOTE — Progress Notes (Signed)
Per Donell SievertSpencer, Simon NP recommend a.m. Psych evaluation Ashdon Gillson K. Zachary Nole, LCAS-A, LPC-A, NCC  Counselor 04/11/2017 1:00 AM
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
## 2017-04-11 NOTE — Progress Notes (Signed)
04/11/17 1342:  LRT went to pt room to offer activities, pt was sleep.  Caroll RancherMarjette Bama Hanselman, LRT/CTRS

## 2017-04-12 DIAGNOSIS — F1290 Cannabis use, unspecified, uncomplicated: Secondary | ICD-10-CM

## 2017-04-12 DIAGNOSIS — F17210 Nicotine dependence, cigarettes, uncomplicated: Secondary | ICD-10-CM

## 2017-04-12 DIAGNOSIS — Z810 Family history of intellectual disabilities: Secondary | ICD-10-CM

## 2017-04-12 DIAGNOSIS — F1490 Cocaine use, unspecified, uncomplicated: Secondary | ICD-10-CM

## 2017-04-12 DIAGNOSIS — F332 Major depressive disorder, recurrent severe without psychotic features: Secondary | ICD-10-CM

## 2017-04-12 NOTE — Discharge Instructions (Signed)
To help you maintain a sober lifestyle, a substance abuse treatment program may be beneficial to you.  Contact [facility] at your earliest opportunity to ask about enrolling in their program: ° °     [facility] °     [address] °     [place], [place] [address] °     [phone] °     New patients are seen at the walk-in clinic every Tuesday from 9:00 am - 12:00 pm. °

## 2017-04-12 NOTE — [facility] Suicide Risk Assessment (Signed)
Suicide Risk Assessment  Discharge Assessment   [facility]   Principal Problem: Depression, major, recurrent, moderate (HCC) Discharge Diagnoses:  Patient Active Problem List   Diagnosis Date Noted  . Severe episode of recurrent major depressive disorder, without psychotic features (HCC) [F33.2] 07/08/2016  . MDD (major depressive disorder), recurrent severe, without psychosis (HCC) [F33.2] 05/23/2016  . Hepatitis C [B19.20] 05/23/2016  . Alcohol use disorder, severe, dependence (HCC) [F10.20] 11/05/2015  . 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 GAD (generalized anxiety disorder) [F41.1] 11/05/2015  . Cellulitis of right lower extremity [L03.115] 03/29/2015    Total Time spent with patient: 30 minutes  Musculoskeletal: Strength & Muscle Tone: within normal limits Gait & Station: normal Patient leans: N/A Psychiatric Specialty Exam: Physical Exam  Constitutional: He appears well-developed and well-nourished.  HENT:  Head: Normocephalic.  Respiratory: Effort normal.  Musculoskeletal: Normal range of motion.  Neurological: He is alert.  Psychiatric: His speech is normal and behavior is normal. Thought content normal. Cognition and memory are normal. He expresses impulsivity. He exhibits a depressed mood.   Review of Systems  Psychiatric/Behavioral: Positive for depression and substance abuse. Negative for hallucinations, memory loss and suicidal ideas. The patient is not nervous/anxious and does not have insomnia.   All other systems reviewed and are negative.  Blood pressure 119/84, pulse 93, temperature 98.3 F (36.8 C), temperature source Oral, resp. rate 20, SpO2 96 %.There is no height or weight on file to calculate BMI. General Appearance: Disheveled Eye Contact:  Fair Speech:  Garbled Volume:  Decreased Mood:  Depressed and Hopeless Affect:  Congruent and Depressed Thought Process:  Coherent and  Linear Orientation:  Full (Time, Place, and Person) Thought Content:  Logical Suicidal Thoughts:  No Homicidal Thoughts:  No Memory:  Immediate;   Good Recent;   Fair Remote;   Fair Judgement:  Poor Insight:  Lacking Psychomotor Activity:  Normal Concentration:  Concentration: Good and Attention Span: Good Recall:  Fair Fund of Knowledge:  Good Language:  Good Akathisia:  No Handed:  Right AIMS (if indicated):    Assets:  ArchitectCommunication Skills Financial Resources/Insurance Housing Social Support ADL's:  Intact Cognition:  WNL Sleep:     Mental Status Per Nursing Assessment::   On Admission:   depressed  Demographic Factors:  Male and Low socioeconomic status  Loss Factors: Decline in physical health  Historical Factors: Impulsivity  Risk Reduction Factors:   Living with another person, especially a relative  Continued Clinical Symptoms:  Depression:   Impulsivity Alcohol/Substance Abuse/Dependencies  Cognitive Features That Contribute To Risk:  None    Suicide Risk:  Minimal: No identifiable suicidal ideation.  Patients presenting with no risk factors but with morbid ruminations; may be classified as minimal risk based on the severity of the depressive symptoms    Plan Of Care/Follow-up recommendations:  Activity:  as tolerated Diet:  Heart healthy  Laveda AbbeLaurie Britton Tazaria Dlugosz, NP 04/12/2017, 1:26 PM

## 2017-04-12 NOTE — Consult Note (Signed)
[facility] [facility] Consult   Reason for Consult:  Depression Referring Physician:  EDP Patient Identification: Tim [name] MRN:  [id number] Principal Diagnosis: Depression, major, recurrent, moderate ([place]) Diagnosis:   Patient Active Problem List   Diagnosis Date Noted  . Severe episode of recurrent major depressive disorder, without psychotic features ([place]) [F33.2] 07/08/2016  . MDD (major depressive disorder), recurrent severe, without psychosis ([place]) [F33.2] 05/23/2016  . Hepatitis C [B19.20] 05/23/2016  . Alcohol use disorder, severe, dependence ([place]) [F10.20] 11/05/2015  . Depression, major, recurrent, moderate ([place]) [F33.1] 11/05/2015  . GAD (generalized anxiety disorder) [F41.1] 11/05/2015  . Cellulitis of right lower extremity [L03.115] 03/29/2015    Total Time spent with patient: 30 minutes  Subjective:   Tim [name] is a 61 y.o. male patient admitted with alcohol intoxication and depression.  HPI:  Tim [name] is a 61 year old male who presented to the [place] [facility] with depression and anxiety. Pt has a history of chronic alcoholism and self-mutilating behavior. Most recently Pt has been burning his bilateral forearms with cigarettes. Pt is non-compliant with his Zyprexa and gabapentin and states "I need help with my drinking." Pt has been drinking since age 83. Pt lives with his mother and is open to receiving outpatient services for his substance abuse problem.  Pt denies suicidal/homicidal ideation, denies auditory/visual hallucinations and does not appear to be responding to internal stimuli.   Past Psychiatric History: MDD, GAD, ETOH Abuse disorder  Risk to Self:None Risk to Others: None Prior Inpatient Therapy: Prior Inpatient Therapy: Yes Prior Therapy Dates: September, August 2017 Prior Therapy Facilty/Provider(s): [facility] Reason for Treatment: SI/S.A. Prior Outpatient Therapy: Prior Outpatient Therapy: Yes Prior Therapy Dates: current Prior Therapy  Facilty/Provider(s): [facility] Reason for Treatment: med mngmnt. Does patient have an ACCT team?: No Does patient have Intensive In-House Services?  : No Does patient have [facility] services? : Yes Does patient have [facility] services?: No  Past Medical History:  Past Medical History:  Diagnosis Date  . Alcoholism ([place])   . Anxiety   . COPD (chronic obstructive pulmonary disease) ([place])   . Depression   . Hep C w/o coma, chronic ([place])   . History of hiatal hernia    hernia rt groin  . Mental health disorder   . Scoliosis   . Thyroid disease     Past Surgical History:  Procedure Laterality Date  . SKIN CANCER EXCISION    . THYROID SURGERY     Family History:  Family History  Problem Relation Age of Onset  . Dementia Mother    Family Psychiatric  History: Unknown Social History:  History  Alcohol Use  . Yes    Comment: drank 1/5 today.     History  Drug Use  . Types: Cocaine, Marijuana    Comment: also states will take pain pills every now and then, last use of cocaine and ETOH today at 1400    Social History   Social History  . Marital status: Single    Spouse name: N/A  . Number of children: N/A  . Years of education: N/A   Social History Main Topics  . Smoking status: Current Every Day Smoker    Packs/day: 0.50    Years: 40.00    Types: Cigarettes  . Smokeless tobacco: Never Used  . Alcohol use Yes     Comment: drank 1/5 today.  . Drug use: Yes    Types: Cocaine, Marijuana     Comment: also states will take pain  pills every now and then, last use of cocaine and ETOH today at 1400  . Sexual activity: No   Other Topics Concern  . Not on file   Social History Narrative   ** Merged History Encounter **       Additional Social History:    Allergies:  No Known Allergies  Labs:  Results for orders placed or performed during the hospital encounter of 04/10/17 (from the past 48 hour(s))  Comprehensive metabolic panel     Status: Abnormal   Collection Time:  04/10/17 10:40 PM  Result Value Ref Range   Sodium 140 135 - 145 mmol/L   Potassium 3.6 3.5 - 5.1 mmol/L   Chloride 107 101 - 111 mmol/L   CO2 24 22 - 32 mmol/L   Glucose, Bld 94 65 - 99 mg/dL   BUN 13 6 - 20 mg/dL   Creatinine, Ser 1.04 0.61 - 1.24 mg/dL   Calcium 9.7 8.9 - 10.3 mg/dL   Total Protein 7.1 6.5 - 8.1 g/dL   Albumin 4.1 3.5 - 5.0 g/dL   AST 20 15 - 41 U/L   ALT 16 (L) 17 - 63 U/L   Alkaline Phosphatase 77 38 - 126 U/L   Total Bilirubin 0.4 0.3 - 1.2 mg/dL   GFR calc non Af Amer >60 >60 mL/min   GFR calc Af Amer >60 >60 mL/min    Comment: (NOTE) The eGFR has been calculated using the CKD EPI equation. This calculation has not been validated in all clinical situations. eGFR's persistently <60 mL/min signify possible Chronic Kidney Disease.    Anion gap 9 5 - 15  Ethanol     Status: Abnormal   Collection Time: 04/10/17 10:40 PM  Result Value Ref Range   Alcohol, Ethyl (B) 104 (H) <5 mg/dL    Comment:        LOWEST DETECTABLE LIMIT FOR SERUM ALCOHOL IS 5 mg/dL FOR MEDICAL PURPOSES ONLY   Salicylate level     Status: None   Collection Time: 04/10/17 10:40 PM  Result Value Ref Range   Salicylate Lvl <5.9 2.8 - 30.0 mg/dL  Acetaminophen level     Status: Abnormal   Collection Time: 04/10/17 10:40 PM  Result Value Ref Range   Acetaminophen (Tylenol), Serum <10 (L) 10 - 30 ug/mL    Comment:        THERAPEUTIC CONCENTRATIONS VARY SIGNIFICANTLY. A RANGE OF 10-30 ug/mL MAY BE AN EFFECTIVE CONCENTRATION FOR MANY PATIENTS. HOWEVER, SOME ARE BEST TREATED AT CONCENTRATIONS OUTSIDE THIS RANGE. ACETAMINOPHEN CONCENTRATIONS >150 ug/mL AT 4 HOURS AFTER INGESTION AND >50 ug/mL AT 12 HOURS AFTER INGESTION ARE OFTEN ASSOCIATED WITH TOXIC REACTIONS.   cbc     Status: None   Collection Time: 04/10/17 10:40 PM  Result Value Ref Range   WBC 10.0 4.0 - 10.5 K/uL   RBC 5.10 4.22 - 5.81 MIL/uL   Hemoglobin 16.0 13.0 - 17.0 g/dL   HCT 45.3 39.0 - 52.0 %   MCV 88.8 78.0  - 100.0 fL   MCH 31.4 26.0 - 34.0 pg   MCHC 35.3 30.0 - 36.0 g/dL   RDW 13.7 11.5 - 15.5 %   Platelets 208 150 - 400 K/uL    Current Facility-Administered Medications  Medication Dose Route Frequency Provider Last Rate Last Dose  . acetaminophen (TYLENOL) tablet 650 mg  650 mg Oral Q4H PRN Pollina, Gwenyth Allegra, MD      . alum & mag hydroxide-simeth (MAALOX/MYLANTA) 200-200-20 MG/5ML suspension 30  mL  30 mL Oral Q6H PRN Pollina, Gwenyth Allegra, MD      . FLUoxetine (PROZAC) capsule 80 mg  80 mg Oral Daily Orpah Greek, MD   80 mg at 04/12/17 0950  . gabapentin (NEURONTIN) capsule 300 mg  300 mg Oral TID Orpah Greek, MD   300 mg at 04/12/17 0951  . LORazepam (ATIVAN) injection 0-4 mg  0-4 mg Intravenous Q6H Pollina, Gwenyth Allegra, MD       Or  . LORazepam (ATIVAN) tablet 0-4 mg  0-4 mg Oral Q6H Pollina, Gwenyth Allegra, MD   Stopped at 04/11/17 1309  . [START ON 04/13/2017] LORazepam (ATIVAN) injection 0-4 mg  0-4 mg Intravenous Q12H Pollina, Gwenyth Allegra, MD       Or  . Derrill Memo ON 04/13/2017] LORazepam (ATIVAN) tablet 0-4 mg  0-4 mg Oral Q12H Pollina, Gwenyth Allegra, MD      . nicotine (NICODERM CQ - dosed in mg/24 hours) patch 21 mg  21 mg Transdermal Daily Orpah Greek, MD   21 mg at 04/12/17 1003  . OLANZapine (ZYPREXA) tablet 20 mg  20 mg Oral QHS Orpah Greek, MD   20 mg at 04/11/17 2110  . ondansetron (ZOFRAN) tablet 4 mg  4 mg Oral Q8H PRN Pollina, Gwenyth Allegra, MD      . silver sulfADIAZINE (SILVADENE) 1 % cream   Topical BID Pollina, Gwenyth Allegra, MD      . thiamine (VITAMIN B-1) tablet 100 mg  100 mg Oral Daily Pollina, Gwenyth Allegra, MD   100 mg at 04/11/17 1014   Or  . thiamine (B-1) injection 100 mg  100 mg Intravenous Daily Orpah Greek, MD   100 mg at 04/12/17 0950  . traZODone (DESYREL) tablet 100 mg  100 mg Oral QHS PRN Corena Pilgrim, MD       Current Outpatient Prescriptions  Medication Sig Dispense Refill  .  FLUoxetine (PROZAC) 40 MG capsule Take 2 capsules (80 mg total) by mouth daily. 60 capsule 0  . gabapentin (NEURONTIN) 300 MG capsule Take 1 capsule (300 mg total) by mouth 3 (three) times daily. 90 capsule 0  . hydrOXYzine (ATARAX/VISTARIL) 25 MG tablet Take 1 tablet (25 mg total) by mouth every 6 (six) hours as needed for anxiety (or CIWA score </= 10). 30 tablet 0  . OLANZapine (ZYPREXA) 20 MG tablet Take 20 mg by mouth at bedtime.    . chlordiazePOXIDE (LIBRIUM) 25 MG capsule 61m PO TID x 1D, then 25-514mPO BID X 1D, then 25-5054mO QD X 1D (Patient not taking: Reported on 02/23/2017) 10 capsule 0  . diclofenac sodium (VOLTAREN) 1 % GEL Apply 2 g topically 4 (four) times daily. (Patient not taking: Reported on 09/21/2016) 1 Tube 0  . doxycycline (VIBRAMYCIN) 100 MG capsule Take 1 capsule (100 mg total) by mouth 2 (two) times daily. (Patient not taking: Reported on 04/10/2017) 14 capsule 0  . ibuprofen (ADVIL,MOTRIN) 600 MG tablet Take 1 tablet (600 mg total) by mouth every 6 (six) hours as needed. (Patient not taking: Reported on 02/23/2017) 30 tablet 0  . traZODone (DESYREL) 50 MG tablet Take 1 tablet (50 mg total) by mouth at bedtime as needed for sleep. (Patient not taking: Reported on 09/21/2016) 30 tablet 0    Musculoskeletal: Strength & Muscle Tone: within normal limits Gait & Station: normal Patient leans: N/A  Psychiatric Specialty Exam: Physical Exam  Constitutional: He appears well-developed and well-nourished.  HENT:  Head: Normocephalic.  Respiratory: Effort normal.  Musculoskeletal: Normal range of motion.  Neurological: He is alert.  Psychiatric: His speech is normal and behavior is normal. Thought content normal. Cognition and memory are normal. He expresses impulsivity. He exhibits a depressed mood.    Review of Systems  Psychiatric/Behavioral: Positive for depression and substance abuse. Negative for hallucinations, memory loss and suicidal ideas. The patient is not  nervous/anxious and does not have insomnia.   All other systems reviewed and are negative.   Blood pressure 119/84, pulse 93, temperature 98.3 F (36.8 C), temperature source Oral, resp. rate 20, SpO2 96 %.There is no height or weight on file to calculate BMI.  General Appearance: Disheveled  Eye Contact:  Fair  Speech:  Garbled  Volume:  Decreased  Mood:  Depressed and Hopeless  Affect:  Congruent and Depressed  Thought Process:  Coherent and Linear  Orientation:  Full (Time, Place, and Person)  Thought Content:  Logical  Suicidal Thoughts:  No  Homicidal Thoughts:  No  Memory:  Immediate;   Good Recent;   Fair Remote;   Fair  Judgement:  Poor  Insight:  Lacking  Psychomotor Activity:  Normal  Concentration:  Concentration: Good and Attention Span: Good  Recall:  [place] of Knowledge:  Good  Language:  Good  Akathisia:  No  Handed:  Right  AIMS (if indicated):     Assets:  Agricultural consultant Housing Social Support  ADL's:  Intact  Cognition:  WNL  Sleep:        Treatment Plan Summary: Plan Discharge Home   [facility] abuse and depression treatment Avoid the use of alcohol and drugs Take your medications as prescribed Follow up with PCP for wounds on forearms and any other medical concerns  Disposition: No evidence of imminent risk to self or others at present.   Patient does not meet criteria for psychiatric inpatient admission. Supportive therapy provided about ongoing stressors. Discussed crisis plan, support from social network, calling 911, coming to the Emergency Department, and calling Suicide Hotline.  Ethelene Hal, NP 04/12/2017 12:10 PM  Patient seen face-to-face for psychiatric evaluation, chart reviewed and case discussed with the physician extender and developed treatment plan. Reviewed the information documented and agree with the treatment plan. Corena Pilgrim,  MD

## 2017-04-12 NOTE — BH Assessment (Signed)
[facility] Assessment Progress Note  Per Thedore MinsMojeed Akintayo, MD, this pt does not require psychiatric hospitalization at this time.  [facility]  This has been included in pt's discharge instructions.  Pt's nurse, Lincoln MaxinOlivette, has been notified.  Doylene Canninghomas Jovie Swanner, MA Triage Specialist [phone]

## 2017-06-10 ENCOUNTER — Encounter (HOSPITAL_COMMUNITY): Payer: Self-pay

## 2017-06-10 ENCOUNTER — Emergency Department (HOSPITAL_COMMUNITY)
Admission: EM | Admit: 2017-06-10 | Discharge: 2017-06-10 | Disposition: A | Discharge status: Home / Self Care | Payer: Self-pay | Attending: Emergency Medicine | Admitting: Emergency Medicine

## 2017-06-10 ENCOUNTER — Emergency Department (HOSPITAL_COMMUNITY): Payer: Self-pay

## 2017-06-10 DIAGNOSIS — K7689 Other specified diseases of liver: Secondary | ICD-10-CM

## 2017-06-10 DIAGNOSIS — Z79899 Other long term (current) drug therapy: Secondary | ICD-10-CM | POA: Insufficient documentation

## 2017-06-10 DIAGNOSIS — R1013 Epigastric pain: Secondary | ICD-10-CM

## 2017-06-10 DIAGNOSIS — J449 Chronic obstructive pulmonary disease, unspecified: Secondary | ICD-10-CM | POA: Insufficient documentation

## 2017-06-10 DIAGNOSIS — K7011 Alcoholic hepatitis with ascites: Secondary | ICD-10-CM | POA: Insufficient documentation

## 2017-06-10 DIAGNOSIS — R7401 Elevation of levels of liver transaminase levels: Secondary | ICD-10-CM

## 2017-06-10 DIAGNOSIS — R740 Nonspecific elevation of levels of transaminase and lactic acid dehydrogenase [LDH]: Secondary | ICD-10-CM | POA: Insufficient documentation

## 2017-06-10 DIAGNOSIS — R160 Hepatomegaly, not elsewhere classified: Secondary | ICD-10-CM | POA: Insufficient documentation

## 2017-06-10 DIAGNOSIS — F17210 Nicotine dependence, cigarettes, uncomplicated: Secondary | ICD-10-CM | POA: Insufficient documentation

## 2017-06-10 LAB — LIPASE, BLOOD: LIPASE: 34 U/L (ref 11–51)

## 2017-06-10 LAB — BASIC METABOLIC PANEL
ANION GAP: 9 (ref 5–15)
BUN: 13 mg/dL (ref 6–20)
CO2: 26 mmol/L (ref 22–32)
Calcium: 9.2 mg/dL (ref 8.9–10.3)
Chloride: 99 mmol/L — ABNORMAL LOW (ref 101–111)
Creatinine, Ser: 1.38 mg/dL — ABNORMAL HIGH (ref 0.61–1.24)
GFR, EST NON AFRICAN AMERICAN: 54 mL/min — AB (ref >60)
GLUCOSE: 102 mg/dL — AB (ref 65–99)
POTASSIUM: 3.8 mmol/L (ref 3.5–5.1)
SODIUM: 134 mmol/L — AB (ref 135–145)

## 2017-06-10 LAB — I-STAT TROPONIN, ED
TROPONIN I, POC: 0 ng/mL (ref 0.00–0.08)
Troponin i, poc: 0 ng/mL (ref 0.00–0.08)

## 2017-06-10 LAB — CBC
HEMATOCRIT: 46.7 % (ref 39.0–52.0)
HEMOGLOBIN: 15.7 g/dL (ref 13.0–17.0)
MCH: 30.8 pg (ref 26.0–34.0)
MCHC: 33.6 g/dL (ref 30.0–36.0)
MCV: 91.7 fL (ref 78.0–100.0)
Platelets: 134 10*3/uL — ABNORMAL LOW (ref 150–400)
RBC: 5.09 MIL/uL (ref 4.22–5.81)
RDW: 13.7 % (ref 11.5–15.5)
WBC: 5.2 10*3/uL (ref 4.0–10.5)

## 2017-06-10 LAB — HEPATIC FUNCTION PANEL
ALBUMIN: 3.1 g/dL — AB (ref 3.5–5.0)
ALT: 393 U/L — ABNORMAL HIGH (ref 17–63)
AST: 557 U/L — AB (ref 15–41)
Alkaline Phosphatase: 165 U/L — ABNORMAL HIGH (ref 38–126)
BILIRUBIN TOTAL: 0.8 mg/dL (ref 0.3–1.2)
Bilirubin, Direct: 0.3 mg/dL (ref 0.1–0.5)
Indirect Bilirubin: 0.5 mg/dL (ref 0.3–0.9)
Total Protein: 5.6 g/dL — ABNORMAL LOW (ref 6.5–8.1)

## 2017-06-10 MED ORDER — SODIUM CHLORIDE 0.9 % IV BOLUS (SEPSIS)
1000.0000 mL | Freq: Once | INTRAVENOUS | Status: AC
  Administered 2017-06-10: 1000 mL via INTRAVENOUS

## 2017-06-10 MED ORDER — GI COCKTAIL ~~LOC~~
30.0000 mL | Freq: Once | ORAL | Status: AC
  Administered 2017-06-10: 30 mL via ORAL
  Filled 2017-06-10: qty 30

## 2017-06-10 MED ORDER — OMEPRAZOLE 20 MG PO CPDR: 20.0000 mg | DELAYED_RELEASE_CAPSULE | Freq: Every day | ORAL | 1 refills | Status: DC

## 2017-06-10 MED ORDER — LORAZEPAM 0.5 MG PO TABS
0.5000 mg | ORAL_TABLET | Freq: Once | ORAL | Status: AC
  Administered 2017-06-10: 0.5 mg via ORAL
  Filled 2017-06-10: qty 1

## 2017-06-10 NOTE — ED Notes (Signed)
Lipase and hepatic function resent

## 2017-06-10 NOTE — Discharge Instructions (Signed)
Please read and follow all provided instructions.  Your diagnoses today include:  1. Alcoholic hepatitis with ascites   2. Transaminitis   3. Epigastric pain   4. Liver nodule    Tests performed today include:  Blood counts and electrolytes  Blood tests to check liver and kidney function - your liver function tests are high  Blood tests to check pancreas function  Urine test to look for infection  Ultrasound - shows fluid around your gallbladder and a nodule on your liver  Vital signs. See below for your results today.   Medications prescribed:   Omeprazole (Prilosec) - stomach acid reducer  This medication can be found over-the-counter  Take any prescribed medications only as directed.  Home care instructions:   Follow any educational materials contained in this packet.  Follow-up instructions: Please follow-up with your primary care provider in the next 7 days for further evaluation of your symptoms. It will be very important to have your liver tests rechecked and a work-up on the lesion seen on your liver.   Return instructions:  SEEK IMMEDIATE MEDICAL ATTENTION IF:  The pain does not go away or becomes severe   A temperature above 101F develops   Repeated vomiting occurs (multiple episodes)   The pain becomes localized to portions of the abdomen. The right side could possibly be appendicitis. In an adult, the left lower portion of the abdomen could be colitis or diverticulitis.   Blood is being passed in stools or vomit (bright red or black tarry stools)   You develop chest pain, difficulty breathing, dizziness or fainting, or become confused, poorly responsive, or inconsolable (young children)  If you have any other emergent concerns regarding your health  Additional Information: Abdominal (belly) pain can be caused by many things. Your caregiver performed an examination and possibly ordered blood/urine tests and imaging (CT scan, x-rays, ultrasound). Many  cases can be observed and treated at home after initial evaluation in the emergency department. Even though you are being discharged home, abdominal pain can be unpredictable. Therefore, you need a repeated exam if your pain does not resolve, returns, or worsens. Most patients with abdominal pain don't have to be admitted to the hospital or have surgery, but serious problems like appendicitis and gallbladder attacks can start out as nonspecific pain. Many abdominal conditions cannot be diagnosed in one visit, so follow-up evaluations are very important.  Your vital signs today were: BP 118/81    Pulse 82    Temp 97.7 F (36.5 C) (Oral)    Resp 18    SpO2 96%  If your blood pressure (bp) was elevated above 135/85 this visit, please have this repeated by your doctor within one month. --------------

## 2017-06-10 NOTE — ED Provider Notes (Signed)
[facility]-EMERGENCY DEPT Provider Note   CSN: [id number] Arrival date & time: 06/10/17  1329     History   Chief Complaint Chief Complaint  Patient presents with  . Chest Pain    HPI Tim [name] is a 61 y.o. male.  Patient with history of alcoholism, COPD -- presents with left sided lower chest pain/upper abdominal pain ongoing for the past 3 days. Pain began while he was watching television. Patient states that he felt a "pop" in his chest. Pain has been persistent, unchanged with food. It radiates to the left shoulder at times. Patient reports nausea, shortness of breath, sweating at onset, however this has not been constant. No vomiting. Symptoms are unchanged with food. Symptoms are nonexertional. Patient does not have a known history of hypertension, diabetes, high cholesterol, family history. Patient is currently smoking approximately half a pack of cigarettes per day. Patient reports occasional NSAID use, not daily. He reports that he is trying to cut back and stop drinking alcohol. Patient states that he last drank a beer 2 days ago. He states that he is having some "shakes" related to decreased alcohol use, however denies any seizure activity or hallucinations. The onset of this condition was acute. The course is constant. Aggravating factors: none. Alleviating factors: none.          Past Medical History:  Diagnosis Date  . Alcoholism (HCC)   . Anxiety   . COPD (chronic obstructive pulmonary disease) (HCC)   . Depression   . Hep C w/o coma, chronic (HCC)   . History of hiatal hernia    hernia rt groin  . Mental health disorder   . Scoliosis   . Thyroid disease     Patient Active Problem List   Diagnosis Date Noted  . Severe episode of recurrent major depressive disorder, without psychotic features (HCC) 07/08/2016  . MDD (major depressive disorder), recurrent severe, without psychosis (HCC) 05/23/2016  . Hepatitis C 05/23/2016  . Alcohol use disorder, severe,  dependence (HCC) 11/05/2015  . Depression, major, recurrent, moderate (HCC) 11/05/2015  . GAD (generalized anxiety disorder) 11/05/2015  . Cellulitis of right lower extremity 03/29/2015    Past Surgical History:  Procedure Laterality Date  . SKIN CANCER EXCISION    . THYROID SURGERY         Home Medications    Prior to Admission medications   Medication Sig Start Date End Date Taking? Authorizing Provider  FLUoxetine (PROZAC) 40 MG capsule Take 2 capsules (80 mg total) by mouth daily. 07/09/16  Yes Thermon Leyland, NP  gabapentin (NEURONTIN) 300 MG capsule Take 1 capsule (300 mg total) by mouth 3 (three) times daily. 02/24/17  Yes Charm Rings, NP  hydrOXYzine (ATARAX/VISTARIL) 25 MG tablet Take 1 tablet (25 mg total) by mouth every 6 (six) hours as needed for anxiety (or CIWA score </= 10). 05/27/16  Yes Adonis Brook, NP  OLANZapine (ZYPREXA) 20 MG tablet Take 20 mg by mouth at bedtime.   Yes [provider]  chlordiazePOXIDE (LIBRIUM) 25 MG capsule 50mg  PO TID x 1D, then 25-50mg  PO BID X 1D, then 25-50mg  PO QD X 1D Patient not taking: Reported on 02/23/2017 01/04/17   Azalia Bilis, MD  diclofenac sodium (VOLTAREN) 1 % GEL Apply 2 g topically 4 (four) times daily. Patient not taking: Reported on 09/21/2016 07/09/16   Thermon Leyland, NP  doxycycline (VIBRAMYCIN) 100 MG capsule Take 1 capsule (100 mg total) by mouth 2 (two) times daily. Patient not  taking: Reported on 04/10/2017 02/24/17   Charm Rings, NP  ibuprofen (ADVIL,MOTRIN) 600 MG tablet Take 1 tablet (600 mg total) by mouth every 6 (six) hours as needed. Patient not taking: Reported on 02/23/2017 12/19/16   Teressa Lower, NP  traZODone (DESYREL) 50 MG tablet Take 1 tablet (50 mg total) by mouth at bedtime as needed for sleep. Patient not taking: Reported on 09/21/2016 05/27/16   Adonis Brook, NP    Family History Family History  Problem Relation Age of Onset  . Dementia Mother     Social History Social  History  Substance Use Topics  . Smoking status: Current Every Day Smoker    Packs/day: 0.50    Years: 40.00    Types: Cigarettes  . Smokeless tobacco: Never Used  . Alcohol use Yes     Comment: drank 1/5 today.     Allergies   Patient has no known allergies.   Review of Systems Review of Systems  Constitutional: Negative for diaphoresis (Resolved) and fever.  Eyes: Negative for redness.  Respiratory: Negative for cough and shortness of breath (Resolved).   Cardiovascular: Positive for chest pain. Negative for palpitations and leg swelling.  Gastrointestinal: Positive for abdominal pain and nausea. Negative for vomiting.  Genitourinary: Negative for dysuria.  Musculoskeletal: Negative for back pain and neck pain.  Skin: Negative for rash.  Neurological: Negative for syncope and light-headedness.  Psychiatric/Behavioral: The patient is not nervous/anxious.      Physical Exam Updated Vital Signs BP 97/74 (BP Location: Left Arm)   Pulse 75   Temp 97.7 F (36.5 C) (Oral)   Resp 16   SpO2 97%   Physical Exam  Constitutional: He appears well-developed and well-nourished.  HENT:  Head: Normocephalic and atraumatic.  Mouth/Throat: Mucous membranes are normal. Mucous membranes are not dry.  Eyes: Conjunctivae are normal.  Neck: Trachea normal and normal range of motion. Neck supple. Normal carotid pulses and no JVD present. No muscular tenderness present. Carotid bruit is not present. No tracheal deviation present.  Cardiovascular: Normal rate, regular rhythm, S1 normal, S2 normal, normal heart sounds and intact distal pulses.  Exam reveals no distant heart sounds and no decreased pulses.   No murmur heard. Pulmonary/Chest: Effort normal and breath sounds normal. No respiratory distress. He has no wheezes. He exhibits no tenderness.  Abdominal: Soft. Normal aorta and bowel sounds are normal. There is tenderness (Mild epigastric tenderness). There is no rebound and no  guarding.  Musculoskeletal: He exhibits no edema.  Neurological: He is alert.  Skin: Skin is warm and dry. He is not diaphoretic. No cyanosis. No pallor.  Psychiatric: He has a normal mood and affect.  Nursing note and vitals reviewed.    ED Treatments / Results  Labs (all labs ordered are listed, but only abnormal results are displayed) Labs Reviewed  BASIC METABOLIC PANEL - Abnormal; Notable for the following:       Result Value   Sodium 134 (*)    Chloride 99 (*)    Glucose, Bld 102 (*)    Creatinine, Ser 1.38 (*)    GFR calc non Af Amer 54 (*)    All other components within normal limits  CBC - Abnormal; Notable for the following:    Platelets 134 (*)    All other components within normal limits  HEPATIC FUNCTION PANEL - Abnormal; Notable for the following:    Total Protein 5.6 (*)    Albumin 3.1 (*)    AST 557 (*)  ALT 393 (*)    Alkaline Phosphatase 165 (*)    All other components within normal limits  LIPASE, BLOOD  HEPATITIS PANEL, ACUTE  I-STAT TROPONIN, ED  I-STAT TROPONIN, ED    EKG  EKG Interpretation  Date/Time:  Saturday June 10 2017 13:54:45 EDT Ventricular Rate:  71 PR Interval:  138 QRS Duration: 96 QT Interval:  388 QTC Calculation: 421 R Axis:   84 Text Interpretation:  Normal sinus rhythm Incomplete right bundle branch block Borderline ECG since last tracing no significant change Confirmed by [name] ([id number]) on 06/10/2017 7:33:45 PM       Radiology Dg Chest 2 View  Result Date: 06/10/2017 CLINICAL DATA:  Left-sided chest pain and shortness of breath for 3 days. Nausea and diaphoresis. COPD. EXAM: CHEST  2 VIEW COMPARISON:  05/04/2016 FINDINGS: The heart size and mediastinal contours are within normal limits. Pulmonary hyperinflation again seen, consistent with COPD. No evidence of pulmonary infiltrate or edema. No evidence of pleural effusion. Several old right-sided rib fracture deformities again. IMPRESSION: COPD.  No active  cardiopulmonary disease. Electronically Signed   By: Myles Rosenthal M.D.   On: 06/10/2017 14:35   US Abdomen Complete  Result Date: 06/10/2017 CLINICAL DATA:  Chest pain and shortness of breath. Elevated LFTs. Hepatitis-C. EXAM: ABDOMEN ULTRASOUND COMPLETE COMPARISON:  None. FINDINGS: Gallbladder: No Murphy's sign, wall thickening, stones, or sludge. Significant pericholecystic fluid is identified. Common bile duct: Diameter: 2.8 mm Liver: The liver is mildly heterogeneous. There is a solid hyperechoic mass measuring 1.1 x 0.8 x 1.0 cm in the inferior right hepatic lobe. Portal vein is patent on color Doppler imaging with normal direction of blood flow towards the liver. IVC: No abnormality visualized. Pancreas: Limited visualization. Spleen: 12.5 cm Right Kidney: Length: 13.1 cm. Echogenicity within normal limits. No mass or hydronephrosis visualized. Left Kidney: Length: 12.3 cm. There is a 12 mm cyst in the lower pole the left kidney. Abdominal aorta: No aneurysm visualized. Other findings: None. IMPRESSION: 1. There is pericholecystic fluid without wall thickening, Murphy's sign, stones, or sludge. The etiology of the pericholecystic fluid is unclear on this study. 2. There is a hyperechoic nodule in the right hepatic lobe. This nodule is nonspecific but could potentially represent a hemangioma. An MRI or CT could better evaluate, especially given the history of hepatitis-C which raises the patient's risk for hepatocellular carcinoma. 3. The spleen measures 12.5 cm which is prominent. A volume was not measured to confirm splenomegaly however. 4. There is a cyst in the left kidney. Electronically Signed   By: Gerome Sam III M.D   On: 06/10/2017 21:04    Procedures Procedures (including critical care time)  Medications Ordered in ED Medications  LORazepam (ATIVAN) tablet 0.5 mg (0.5 mg Oral Given 06/10/17 1608)  gi cocktail (Maalox,Lidocaine,Donnatal) (30 mLs Oral Given 06/10/17 1609)  sodium  chloride 0.9 % bolus 1,000 mL (0 mLs Intravenous Stopped 06/10/17 1829)     Initial Impression / Assessment and Plan / ED Course  I have reviewed the triage vital signs and the nursing notes.  Pertinent labs & imaging results that were available during my care of the patient were reviewed by me and considered in my medical decision making (see chart for details).      Patient seen and examined. Additional workup ordered. Medications ordered.    Vital signs reviewed and are as follows: BP 97/74 (BP Location: Left Arm)   Pulse 75   Temp 97.7 F (36.5 C) (Oral)  Resp 16   SpO2 97%   Patient does not seem to have routine health care and may have underlying conditions that have not been diagnosed. For this reason, will check culture troponin and EKG. Overall, low risk for cardiac chest pain. Given extensive history of alcohol use, may be related to gastritis, PUD. Also symptoms started about the time of alcohol cessation so withdrawal could be contributing. Will check hepatic function panel and lipase to complete workup. Remainder of workup to this point is reassuring. No clinical signs of DVT or PE. Patient is not acutely short of breath.  7:55 PM Elevated transaminases. Likely alcoholic hepatitis. He has underlying untreated Hep C and denies IVDU. Will check [place] to r/o gallbladder disease. Will send acute hepatitis panel. Anticipate d/c if [place] is reassuring. Pt updated and agrees with plan. He is eating without difficulty.   Discussed ultrasound results with Dr. Hyacinth Meeker. Impressed upon the patient importance of having these tests followed up. PCP and GI referrals given and encouraged. Discussed that it is unclear, given his hepatitis C and history of heavy alcohol use, whether the area on the liver represents a benign cyst or other lesion or cancer.  You have this further workup as an outpatient. Also, he will need his liver enzymes followed to make sure that they normalize. Discussed that  hepatitis panel will be pending.  The patient was urged to return to the Emergency Department immediately with worsening of current symptoms, worsening abdominal pain, persistent vomiting, blood noted in stools, fever, or any other concerns. The patient verbalized understanding.      Final Clinical Impressions(s) / ED Diagnoses   Final diagnoses:  Transaminitis  Alcoholic hepatitis with ascites  Epigastric pain  Liver nodule   Patient with chest pain/epigastric pain. No concern for cardiac or pulmonary etiology of an atypical story, negative troponin 2, nonischemic EKG. No concerning clinical findings for PE.Patient found to have elevated transaminases and likely pain from alcoholic hepatitis. Gastritis also possible. Normal lipase. Abdominal ultrasound demonstrates pericholecystic fluid without signs of cholecystitis. Also patient has a small nodular area on the liver of undetermined etiology and significance. No further indication for inpatient workup at this time. Outpatient referrals given. Return instructions as above. Patient informed of all results and importance of follow-up.encourage continue alcohol cessation.No signs of significant withdrawal at this time.   New Prescriptions Discharge Medication List as of 06/10/2017  9:47 PM    START taking these medications   Details  omeprazole (PRILOSEC) 20 MG capsule Take 1 capsule (20 mg total) by mouth daily., Starting Sat 06/10/2017, Print         Renne Crigler, PA-C 06/11/17 David Stall    [name], MD 06/12/17 1534

## 2017-06-10 NOTE — ED Triage Notes (Signed)
Pt reports chest pain, sob x 3 days. Pt reports pain is central chest and radiates to left chest. HE states when the pain started he experienced sob/diaphoresis/ nausea.

## 2017-06-11 LAB — HEPATITIS PANEL, ACUTE
HCV Ab: >11 s/co ratio — ABNORMAL HIGH (ref 0.0–0.9)
HEP A IGM: NEGATIVE
HEP B C IGM: NEGATIVE
Hepatitis B Surface Ag: NEGATIVE

## 2017-08-19 ENCOUNTER — Encounter (HOSPITAL_COMMUNITY): Payer: Self-pay

## 2017-08-19 ENCOUNTER — Emergency Department (HOSPITAL_COMMUNITY)
Admission: EM | Admit: 2017-08-19 | Discharge: 2017-08-20 | Disposition: A | Discharge status: Other Acute Inpatient Hospital | Payer: Self-pay | Attending: Emergency Medicine | Admitting: Emergency Medicine

## 2017-08-19 DIAGNOSIS — Z046 Encounter for general psychiatric examination, requested by authority: Secondary | ICD-10-CM | POA: Insufficient documentation

## 2017-08-19 DIAGNOSIS — J45909 Unspecified asthma, uncomplicated: Secondary | ICD-10-CM | POA: Insufficient documentation

## 2017-08-19 DIAGNOSIS — Z85828 Personal history of other malignant neoplasm of skin: Secondary | ICD-10-CM | POA: Insufficient documentation

## 2017-08-19 DIAGNOSIS — F17210 Nicotine dependence, cigarettes, uncomplicated: Secondary | ICD-10-CM | POA: Insufficient documentation

## 2017-08-19 DIAGNOSIS — X838XXA Intentional self-harm by other specified means, initial encounter: Secondary | ICD-10-CM | POA: Insufficient documentation

## 2017-08-19 DIAGNOSIS — R45851 Suicidal ideations: Secondary | ICD-10-CM | POA: Insufficient documentation

## 2017-08-19 DIAGNOSIS — T50902A Poisoning by unspecified drugs, medicaments and biological substances, intentional self-harm, initial encounter: Secondary | ICD-10-CM | POA: Insufficient documentation

## 2017-08-19 DIAGNOSIS — Z79899 Other long term (current) drug therapy: Secondary | ICD-10-CM | POA: Insufficient documentation

## 2017-08-19 LAB — CBC
HCT: 41.4 % (ref 39.0–52.0)
HEMOGLOBIN: 14.1 g/dL (ref 13.0–17.0)
MCH: 30.6 pg (ref 26.0–34.0)
MCHC: 34.1 g/dL (ref 30.0–36.0)
MCV: 89.8 fL (ref 78.0–100.0)
Platelets: 193 10*3/uL (ref 150–400)
RBC: 4.61 MIL/uL (ref 4.22–5.81)
RDW: 14.3 % (ref 11.5–15.5)
WBC: 7.5 10*3/uL (ref 4.0–10.5)

## 2017-08-19 LAB — COMPREHENSIVE METABOLIC PANEL
ALT: 15 U/L — AB (ref 17–63)
ANION GAP: 12 (ref 5–15)
AST: 19 U/L (ref 15–41)
Albumin: 3.9 g/dL (ref 3.5–5.0)
Alkaline Phosphatase: 73 U/L (ref 38–126)
BUN: 10 mg/dL (ref 6–20)
CHLORIDE: 108 mmol/L (ref 101–111)
CO2: 19 mmol/L — ABNORMAL LOW (ref 22–32)
CREATININE: 0.78 mg/dL (ref 0.61–1.24)
Calcium: 9.4 mg/dL (ref 8.9–10.3)
Glucose, Bld: 92 mg/dL (ref 65–99)
POTASSIUM: 3.7 mmol/L (ref 3.5–5.1)
Sodium: 139 mmol/L (ref 135–145)
Total Bilirubin: 0.3 mg/dL (ref 0.3–1.2)
Total Protein: 6.9 g/dL (ref 6.5–8.1)

## 2017-08-19 LAB — RAPID URINE DRUG SCREEN, HOSP PERFORMED
Amphetamines: NOT DETECTED
BENZODIAZEPINES: NOT DETECTED
Barbiturates: NOT DETECTED
COCAINE: NOT DETECTED
OPIATES: NOT DETECTED
TETRAHYDROCANNABINOL: NOT DETECTED

## 2017-08-19 LAB — ETHANOL: ALCOHOL ETHYL (B): 103 mg/dL — AB (ref <10)

## 2017-08-19 LAB — ACETAMINOPHEN LEVEL: Acetaminophen (Tylenol), Serum: <10 ug/mL — ABNORMAL LOW (ref 10–30)

## 2017-08-19 LAB — SALICYLATE LEVEL

## 2017-08-19 MED ORDER — SODIUM CHLORIDE 0.9 % IV BOLUS (SEPSIS)
1000.0000 mL | Freq: Once | INTRAVENOUS | Status: AC
  Administered 2017-08-19: 1000 mL via INTRAVENOUS

## 2017-08-19 NOTE — ED Notes (Signed)
Talked with Patty at [facility] states to watch for 6 hours watch QT give fluids for hypotension and norepi if fluids don't work.

## 2017-08-19 NOTE — ED Triage Notes (Addendum)
Drank alcohol and took 30 tabs gabapentin 100mg , 30 tabs prozac, rispideral 0.5mg  30 tabs  unable to give time ingested alert and oriented x 4 no respiratory or acute distress noted.
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
## 2017-08-19 NOTE — ED Notes (Signed)
Bed: RESB Expected date:  Expected time:  Means of arrival:  Comments: 58 overdose

## 2017-08-20 ENCOUNTER — Inpatient Hospital Stay (HOSPITAL_COMMUNITY)
Admission: AD | Admit: 2017-08-20 | Discharge: 2017-08-24 | DRG: 885 | Disposition: A | Discharge status: Home / Self Care | Payer: Federal, State, Local not specified - Other | Source: Intra-hospital | Attending: Psychiatry | Admitting: Psychiatry

## 2017-08-20 DIAGNOSIS — Y905 Blood alcohol level of 100-119 mg/100 ml: Secondary | ICD-10-CM | POA: Diagnosis present

## 2017-08-20 DIAGNOSIS — Z915 Personal history of self-harm: Secondary | ICD-10-CM | POA: Diagnosis not present

## 2017-08-20 DIAGNOSIS — Z85828 Personal history of other malignant neoplasm of skin: Secondary | ICD-10-CM | POA: Diagnosis not present

## 2017-08-20 DIAGNOSIS — X088XXA Exposure to other specified smoke, fire and flames, initial encounter: Secondary | ICD-10-CM | POA: Diagnosis present

## 2017-08-20 DIAGNOSIS — J449 Chronic obstructive pulmonary disease, unspecified: Secondary | ICD-10-CM | POA: Diagnosis present

## 2017-08-20 DIAGNOSIS — F411 Generalized anxiety disorder: Secondary | ICD-10-CM | POA: Diagnosis present

## 2017-08-20 DIAGNOSIS — T43221A Poisoning by selective serotonin reuptake inhibitors, accidental (unintentional), initial encounter: Secondary | ICD-10-CM | POA: Diagnosis present

## 2017-08-20 DIAGNOSIS — E079 Disorder of thyroid, unspecified: Secondary | ICD-10-CM | POA: Diagnosis present

## 2017-08-20 DIAGNOSIS — T43222A Poisoning by selective serotonin reuptake inhibitors, intentional self-harm, initial encounter: Secondary | ICD-10-CM | POA: Diagnosis not present

## 2017-08-20 DIAGNOSIS — F10220 Alcohol dependence with intoxication, uncomplicated: Secondary | ICD-10-CM | POA: Diagnosis present

## 2017-08-20 DIAGNOSIS — F332 Major depressive disorder, recurrent severe without psychotic features: Secondary | ICD-10-CM | POA: Diagnosis not present

## 2017-08-20 DIAGNOSIS — Z23 Encounter for immunization: Secondary | ICD-10-CM | POA: Diagnosis not present

## 2017-08-20 DIAGNOSIS — Z8619 Personal history of other infectious and parasitic diseases: Secondary | ICD-10-CM

## 2017-08-20 DIAGNOSIS — T22012A Burn of unspecified degree of left forearm, initial encounter: Secondary | ICD-10-CM | POA: Diagnosis present

## 2017-08-20 DIAGNOSIS — F1020 Alcohol dependence, uncomplicated: Secondary | ICD-10-CM | POA: Diagnosis present

## 2017-08-20 DIAGNOSIS — F1024 Alcohol dependence with alcohol-induced mood disorder: Secondary | ICD-10-CM | POA: Diagnosis present

## 2017-08-20 DIAGNOSIS — T43592A Poisoning by other antipsychotics and neuroleptics, intentional self-harm, initial encounter: Secondary | ICD-10-CM | POA: Diagnosis not present

## 2017-08-20 DIAGNOSIS — F17210 Nicotine dependence, cigarettes, uncomplicated: Secondary | ICD-10-CM | POA: Diagnosis present

## 2017-08-20 DIAGNOSIS — T22011A Burn of unspecified degree of right forearm, initial encounter: Secondary | ICD-10-CM | POA: Diagnosis present

## 2017-08-20 DIAGNOSIS — T43591A Poisoning by other antipsychotics and neuroleptics, accidental (unintentional), initial encounter: Secondary | ICD-10-CM | POA: Diagnosis present

## 2017-08-20 MED ORDER — IBUPROFEN 200 MG PO TABS: 400.0000 mg | ORAL_TABLET | Freq: Four times a day (QID) | ORAL | Status: DC | PRN

## 2017-08-20 MED ORDER — HYDROXYZINE HCL 25 MG PO TABS
25.0000 mg | ORAL_TABLET | Freq: Four times a day (QID) | ORAL | Status: AC | PRN
  Administered 2017-08-21 – 2017-08-22 (×3): 25 mg via ORAL
  Filled 2017-08-20 (×3): qty 1

## 2017-08-20 MED ORDER — CHLORDIAZEPOXIDE HCL 25 MG PO CAPS
25.0000 mg | ORAL_CAPSULE | Freq: Every day | ORAL | Status: AC
  Administered 2017-08-24: 25 mg via ORAL
  Filled 2017-08-20: qty 1

## 2017-08-20 MED ORDER — LOPERAMIDE HCL 2 MG PO CAPS: 2.0000 mg | ORAL_CAPSULE | ORAL | Status: AC | PRN

## 2017-08-20 MED ORDER — ACETAMINOPHEN 325 MG PO TABS
650.0000 mg | ORAL_TABLET | Freq: Four times a day (QID) | ORAL | Status: DC | PRN
  Administered 2017-08-21 – 2017-08-24 (×2): 650 mg via ORAL
  Filled 2017-08-20 (×2): qty 2

## 2017-08-20 MED ORDER — CHLORDIAZEPOXIDE HCL 25 MG PO CAPS
25.0000 mg | ORAL_CAPSULE | Freq: Three times a day (TID) | ORAL | Status: AC
  Administered 2017-08-22 (×3): 25 mg via ORAL
  Filled 2017-08-20 (×3): qty 1

## 2017-08-20 MED ORDER — CHLORDIAZEPOXIDE HCL 25 MG PO CAPS: 25.0000 mg | ORAL_CAPSULE | Freq: Four times a day (QID) | ORAL | Status: AC | PRN

## 2017-08-20 MED ORDER — CHLORDIAZEPOXIDE HCL 25 MG PO CAPS
25.0000 mg | ORAL_CAPSULE | Freq: Four times a day (QID) | ORAL | Status: AC
  Administered 2017-08-21 (×5): 25 mg via ORAL
  Filled 2017-08-20 (×5): qty 1

## 2017-08-20 MED ORDER — ALUM & MAG HYDROXIDE-SIMETH 200-200-20 MG/5ML PO SUSP
30.0000 mL | ORAL | Status: DC | PRN
  Administered 2017-08-21 – 2017-08-23 (×2): 30 mL via ORAL
  Filled 2017-08-20 (×2): qty 30

## 2017-08-20 MED ORDER — MAGNESIUM HYDROXIDE 400 MG/5ML PO SUSP: 30.0000 mL | Freq: Every day | ORAL | Status: DC | PRN

## 2017-08-20 MED ORDER — CHLORDIAZEPOXIDE HCL 25 MG PO CAPS
25.0000 mg | ORAL_CAPSULE | ORAL | Status: AC
  Administered 2017-08-23 (×2): 25 mg via ORAL
  Filled 2017-08-20 (×2): qty 1

## 2017-08-20 MED ORDER — ONDANSETRON 4 MG PO TBDP: 4.0000 mg | ORAL_TABLET | Freq: Four times a day (QID) | ORAL | Status: AC | PRN

## 2017-08-20 MED ORDER — ACETAMINOPHEN 500 MG PO TABS: 500.0000 mg | ORAL_TABLET | Freq: Four times a day (QID) | ORAL | Status: DC | PRN

## 2017-08-20 NOTE — Progress Notes (Signed)
Per NP, pt is appropriate for MH/SA psychiatric inpatient admission.  CSW faxed out referrals to and received fax confirmations from the following facilities:  1[facility]. [facility] 4. [facility] 5. [facility]  Please reconsult if future social work needs arise.  CSW signing off, as social work intervention is no longer needed.  Dorothe PeaJonathan F. Amberlea Spagnuolo, LCSW, LCAS, CSI Clinical Social Worker Ph: [phone]

## 2017-08-20 NOTE — ED Notes (Signed)
Pt observed in bed majority of the shift. Pt endorsing depression. Endorsing SI. Encouragement and support provided. Special checks q 15 mins in place for safety, Video monitoring in place. Will continue to monitor.

## 2017-08-20 NOTE — ED Notes (Signed)
Informed EDP pt home medications have not been reordered. Awaiting orders.

## 2017-08-20 NOTE — ED Notes (Signed)
Spoke with Poison control- pt case has been closed.

## 2017-08-20 NOTE — [facility] Counselor (Signed)
[facility]

## 2017-08-20 NOTE — ED Provider Notes (Signed)
Staff inquired re home dose of prozac and neurontin.  As it is noted that pt is here with overdose of both, do not feel should yet be restarted.  Vitals normal. Awaiting BH placement.      Cathren LaineSteinl, Victoriana Aziz, MD 08/20/17 [phone]

## 2017-08-20 NOTE — ED Notes (Signed)
Pt taking a shower 

## 2017-08-20 NOTE — ED Notes (Signed)
SBAR report provided to receiving nurse Bonita QuinLinda. All personal posessons given to transporter. Pt escorted off unit with transporter and all necessary paperwork for receiving facility.

## 2017-08-20 NOTE — ED Provider Notes (Signed)
  Physical Exam  BP 95/67   Pulse 66   Temp 97.6 F (36.4 C) (Oral)   Resp 16   Ht 6' (1.829 m)   Wt 81.6 kg (180 lb)   SpO2 93%   BMI 24.41 kg/m   Physical Exam  ED Course  Procedures  MDM Patient care assumed at midnight. Patient ingested prozac. Poison control recommend monitoring until 3 am and repeat EKG. Repeat EKG showed no prolonged QTc. Medically cleared. Psych recommended inpatient psych admission.      Charlynne PanderYao, David Hsienta, MD 08/20/17 [phone]
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
## 2017-08-20 NOTE — ED Notes (Signed)
SBAR Report received from previous nurse. Pt received calm and visible on unit. Pt denies current SI/ HI, A/V H, depression, anxiety, or pain at this time, and appears otherwise stable and free of distress. Pt reminded of camera surveillance, q 15 min rounds, and rules of the milieu. Will continue to assess. 

## 2017-08-21 ENCOUNTER — Other Ambulatory Visit: Payer: Self-pay

## 2017-08-21 ENCOUNTER — Encounter (HOSPITAL_COMMUNITY): Payer: Self-pay

## 2017-08-21 DIAGNOSIS — T43592A Poisoning by other antipsychotics and neuroleptics, intentional self-harm, initial encounter: Secondary | ICD-10-CM

## 2017-08-21 DIAGNOSIS — R4584 Anhedonia: Secondary | ICD-10-CM

## 2017-08-21 DIAGNOSIS — F1024 Alcohol dependence with alcohol-induced mood disorder: Secondary | ICD-10-CM

## 2017-08-21 DIAGNOSIS — Z810 Family history of intellectual disabilities: Secondary | ICD-10-CM

## 2017-08-21 DIAGNOSIS — F419 Anxiety disorder, unspecified: Secondary | ICD-10-CM

## 2017-08-21 DIAGNOSIS — T43222A Poisoning by selective serotonin reuptake inhibitors, intentional self-harm, initial encounter: Secondary | ICD-10-CM

## 2017-08-21 DIAGNOSIS — F1910 Other psychoactive substance abuse, uncomplicated: Secondary | ICD-10-CM

## 2017-08-21 DIAGNOSIS — T426X2A Poisoning by other antiepileptic and sedative-hypnotic drugs, intentional self-harm, initial encounter: Secondary | ICD-10-CM

## 2017-08-21 DIAGNOSIS — R51 Headache: Secondary | ICD-10-CM

## 2017-08-21 DIAGNOSIS — R5383 Other fatigue: Secondary | ICD-10-CM

## 2017-08-21 DIAGNOSIS — T1491XA Suicide attempt, initial encounter: Secondary | ICD-10-CM

## 2017-08-21 DIAGNOSIS — R440 Auditory hallucinations: Secondary | ICD-10-CM

## 2017-08-21 DIAGNOSIS — F332 Major depressive disorder, recurrent severe without psychotic features: Principal | ICD-10-CM

## 2017-08-21 DIAGNOSIS — R4587 Impulsiveness: Secondary | ICD-10-CM

## 2017-08-21 MED ORDER — TRAZODONE HCL 50 MG PO TABS
50.0000 mg | ORAL_TABLET | Freq: Every evening | ORAL | Status: DC | PRN
  Administered 2017-08-21 – 2017-08-22 (×3): 50 mg via ORAL
  Filled 2017-08-21 (×7): qty 1

## 2017-08-21 MED ORDER — PNEUMOCOCCAL VAC POLYVALENT 25 MCG/0.5ML IJ INJ: 0.5000 mL | INJECTION | INTRAMUSCULAR | Status: DC

## 2017-08-21 MED ORDER — VENLAFAXINE HCL ER 37.5 MG PO CP24
37.5000 mg | ORAL_CAPSULE | Freq: Every day | ORAL | Status: DC
  Administered 2017-08-22 – 2017-08-24 (×3): 37.5 mg via ORAL
  Filled 2017-08-21 (×4): qty 1
  Filled 2017-08-21 (×2): qty 21

## 2017-08-21 MED ORDER — INFLUENZA VAC SPLIT QUAD 0.5 ML IM SUSY
0.5000 mL | PREFILLED_SYRINGE | INTRAMUSCULAR | Status: AC
  Administered 2017-08-22: 0.5 mL via INTRAMUSCULAR
  Filled 2017-08-21: qty 0.5

## 2017-08-21 MED ORDER — NICOTINE 21 MG/24HR TD PT24
21.0000 mg | MEDICATED_PATCH | Freq: Every day | TRANSDERMAL | Status: DC
  Administered 2017-08-21 – 2017-08-24 (×4): 21 mg via TRANSDERMAL
  Filled 2017-08-21 (×7): qty 1
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
## 2017-08-21 NOTE — Tx Team (Signed)
Initial Treatment Plan 08/21/2017 1:16 AM Tim [name] ZOX:[id number]    PATIENT STRESSORS: Financial difficulties Health problems Substance abuse   PATIENT STRENGTHS: Active sense of humor Motivation for treatment/growth Supportive family/friends   PATIENT IDENTIFIED PROBLEMS:   depression  anxiety  Risk for suicide  alcohol abuse  "I want to live"  "learn ways to relieve anger"         DISCHARGE CRITERIA:  Adequate post-discharge living arrangements Improved stabilization in mood, thinking, and/or behavior Need for constant or close observation no longer present Withdrawal symptoms are absent or subacute and managed without 24-hour nursing intervention  PRELIMINARY DISCHARGE PLAN: Attend PHP/IOP Attend 12-step recovery group Return to previous living arrangement  PATIENT/FAMILY INVOLVEMENT: This treatment plan has been presented to and reviewed with the patient, Tim [name], and/or family member,  The patient and family have been given the opportunity to ask questions and make suggestions.  JEHU-APPIAH, Jozlynn Plaia K, RN 08/21/2017, 1:16 AM
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
## 2017-08-21 NOTE — Progress Notes (Signed)
Recreation Therapy Notes  Date: 08/21/17 Time: 0930 Location: [address] Dayroom  Group Topic: Stress Management  Goal Area(s) Addresses:  Patient will verbalize importance of using healthy stress management.  Patient will identify positive emotions associated with healthy stress management.   Behavioral Response: Engaged  Intervention: Stress Management  Activity :  Meditation.  LRT introduced the stress management technique of meditation.  LRT played a meditation on forgiveness of others to allow patients to explore their feelings.  Patients were to follow along as the meditation played to fully engage in the technique.  Education:  Stress Management, Discharge Planning.   Education Outcome: Acknowledges edcuation/In group clarification offered/Needs additional education  Clinical Observations/Feedback:  Pt attended group.   Caroll RancherMarjette Chiron Campione, LRT/CTRS         Caroll RancherLindsay, Lynore Coscia A 08/21/2017 11:35 AM
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
## 2017-08-21 NOTE — Progress Notes (Signed)
Patient ID: Tim LandauJeffrey L [name], male   DOB: May 16, 1956, 61 y.o.   MRN: [id number]  Pt currently presents with a pleasant affect and cooperative behavior. Endorses on going depression. Pt reports to Clinical research associatewriter that their goal is to "go to more groups tomorrow." Pt states "I have really been learning a lot from them." Pt reports difficulty seeing since his glasses were lost. Pt reports poor sleep with current medication regimen.   Pt provided with medications per providers orders. Pt's labs and vitals were monitored throughout the night. Pt given a 1:1 about emotional and mental status. Pt supported and encouraged to express concerns and questions. Pt educated on medications and offered to perform daily tasks is needed.   Pt's safety ensured with 15 minute and environmental checks. Pt currently denies SI/HI and A/V hallucinations. Pt verbally agrees to seek staff if SI/HI or A/VH occurs and to consult with staff before acting on any harmful thoughts.  Will continue POC.

## 2017-08-21 NOTE — Tx Team (Signed)
Interdisciplinary Treatment and Diagnostic Plan Update  08/21/2017 Time of Session: 0830AM Delmar LandauJeffrey L Shiffer MRN: [id number]  Principal Diagnosis: MDD recurrent, severe  Secondary Diagnoses: Active Problems:   Severe recurrent major depression without psychotic features (HCC)   Current Medications:  Current Facility-Administered Medications  Medication Dose Route Frequency Provider Last Rate Last Dose  . acetaminophen (TYLENOL) tablet 650 mg  650 mg Oral Q6H PRN Jackelyn PolingBerry, Jason A, NP      . alum & mag hydroxide-simeth (MAALOX/MYLANTA) 200-200-20 MG/5ML suspension 30 mL  30 mL Oral Q4H PRN Nira ConnBerry, Jason A, NP      . chlordiazePOXIDE (LIBRIUM) capsule 25 mg  25 mg Oral Q6H PRN Nira ConnBerry, Jason A, NP      . chlordiazePOXIDE (LIBRIUM) capsule 25 mg  25 mg Oral QID Nira ConnBerry, Jason A, NP   25 mg at 08/21/17 0805   Followed by  . [START ON 08/22/2017] chlordiazePOXIDE (LIBRIUM) capsule 25 mg  25 mg Oral TID Jackelyn PolingBerry, Jason A, NP       Followed by  . [START ON 08/23/2017] chlordiazePOXIDE (LIBRIUM) capsule 25 mg  25 mg Oral BH-qamhs Jackelyn PolingBerry, Jason A, NP       Followed by  . [START ON 08/24/2017] chlordiazePOXIDE (LIBRIUM) capsule 25 mg  25 mg Oral Daily Nira ConnBerry, Jason A, NP      . hydrOXYzine (ATARAX/VISTARIL) tablet 25 mg  25 mg Oral Q6H PRN Jackelyn PolingBerry, Jason A, NP      . Melene Muller[START ON 08/22/2017] Influenza vac split quadrivalent PF (FLUARIX) injection 0.5 mL  0.5 mL Intramuscular Tomorrow-1000 Cobos, Fernando A, MD      . loperamide (IMODIUM) capsule 2-4 mg  2-4 mg Oral PRN Nira ConnBerry, Jason A, NP      . magnesium hydroxide (MILK OF MAGNESIA) suspension 30 mL  30 mL Oral Daily PRN Nira ConnBerry, Jason A, NP      . nicotine (NICODERM CQ - dosed in mg/24 hours) patch 21 mg  21 mg Transdermal Daily Cobos, Rockey SituFernando A, MD   21 mg at 08/21/17 0804  . ondansetron (ZOFRAN-ODT) disintegrating tablet 4 mg  4 mg Oral Q6H PRN Jackelyn PolingBerry, Jason A, NP      . [START ON 08/22/2017] pneumococcal 23 valent vaccine (PNU-IMMUNE) injection 0.5 mL  0.5 mL  Intramuscular Tomorrow-1000 Cobos, Rockey SituFernando A, MD       PTA Medications: Medications Prior to Admission  Medication Sig Dispense Refill Last Dose  . albuterol (PROVENTIL HFA;VENTOLIN HFA) 108 (90 Base) MCG/ACT inhaler Inhale 2 puffs into the lungs every 6 (six) hours as needed for wheezing or shortness of breath.   unknown  . aspirin 325 MG tablet Take 650 mg by mouth every 6 (six) hours as needed for moderate pain.   Past Week at Unknown time  . FLUoxetine (PROZAC) 40 MG capsule Take 2 capsules (80 mg total) by mouth daily. 60 capsule 0 08/19/2017 at Unknown time  . gabapentin (NEURONTIN) 300 MG capsule Take 1 capsule (300 mg total) by mouth 3 (three) times daily. 90 capsule 0 08/19/2017 at Unknown time  . hydrOXYzine (ATARAX/VISTARIL) 25 MG tablet Take 1 tablet (25 mg total) by mouth every 6 (six) hours as needed for anxiety (or CIWA score </= 10). (Patient not taking: Reported on 08/19/2017) 30 tablet 0 Not Taking at Unknown time  . ibuprofen (ADVIL,MOTRIN) 200 MG tablet Take 400 mg by mouth every 6 (six) hours as needed for moderate pain.   Past Week at Unknown time  . omeprazole (PRILOSEC) 20 MG capsule Take 1  capsule (20 mg total) by mouth daily. (Patient not taking: Reported on 08/19/2017) 30 capsule 1 Not Taking at Unknown time    Patient Stressors: Financial difficulties Health problems Substance abuse  Patient Strengths: Active sense of humor Motivation for treatment/growth Supportive family/friends  Treatment Modalities: Medication Management, Group therapy, Case management,  1 to 1 session with clinician, Psychoeducation, Recreational therapy.   Physician Treatment Plan for Primary Diagnosis: MDD recurrent, severe Long Term Goal(s):     Short Term Goals:    Medication Management: Evaluate patient's response, side effects, and tolerance of medication regimen.  Therapeutic Interventions: 1 to 1 sessions, Unit Group sessions and Medication administration.  Evaluation of  Outcomes: Progressing  Physician Treatment Plan for Secondary Diagnosis: Active Problems:   Severe recurrent major depression without psychotic features (HCC)  Long Term Goal(s):     Short Term Goals:       Medication Management: Evaluate patient's response, side effects, and tolerance of medication regimen.  Therapeutic Interventions: 1 to 1 sessions, Unit Group sessions and Medication administration.  Evaluation of Outcomes: Progressing   RN Treatment Plan for Primary Diagnosis: MDD recurrent, severe Long Term Goal(s): Knowledge of disease and therapeutic regimen to maintain health will improve  Short Term Goals: Ability to remain free from injury will improve, Ability to verbalize feelings will improve and Ability to disclose and discuss suicidal ideas  Medication Management: RN will administer medications as ordered by provider, will assess and evaluate patient's response and provide education to patient for prescribed medication. RN will report any adverse and/or side effects to prescribing provider.  Therapeutic Interventions: 1 on 1 counseling sessions, Psychoeducation, Medication administration, Evaluate responses to treatment, Monitor vital signs and CBGs as ordered, Perform/monitor CIWA, COWS, AIMS and Fall Risk screenings as ordered, Perform wound care treatments as ordered.  Evaluation of Outcomes: Progressing   LCSW Treatment Plan for Primary Diagnosis: MDD recurrent, severe Long Term Goal(s): Safe transition to appropriate next level of care at discharge, Engage patient in therapeutic group addressing interpersonal concerns.  Short Term Goals: Engage patient in aftercare planning with referrals and resources, Facilitate patient progression through stages of change regarding substance use diagnoses and concerns and Identify triggers associated with mental health/substance abuse issues  Therapeutic Interventions: Assess for all discharge needs, 1 to 1 time with Social  worker, Explore available resources and support systems, Assess for adequacy in community support network, Educate family and significant other(s) on suicide prevention, Complete Psychosocial Assessment, Interpersonal group therapy.  Evaluation of Outcomes: Progressing   Progress in Treatment: Attending groups: No. New to unit. Continuing to assess.  Participating in groups: No. Taking medication as prescribed: Yes. Toleration medication: Yes. Family/Significant other contact made: No, will contact:  family member if patient consents to collateral contact.  Patient understands diagnosis: Yes. Discussing patient identified problems/goals with staff: Yes. Medical problems stabilized or resolved: Yes. Denies suicidal/homicidal ideation: Yes. Issues/concerns per patient self-inventory: No. Other: n/a   New problem(s) identified: No, Describe:  n/a  New Short Term/Long Term Goal(s): detox, medication management for mood stabilization, development of comprehensive mental wellness/sobriety plan.   Discharge Plan or Barriers: CSW assessing for appropriate referrals. Pt is current with [facility].   Reason for Continuation of Hospitalization: Anxiety Depression Medication stabilization Suicidal ideation Withdrawal symptoms  Estimated Length of Stay: Wed 08/23/17  Attendees: Patient: 08/21/2017 8:25 AM  Physician: Dr. Jama Flavors MD; Dr. Altamese [place] MD 08/21/2017 8:25 AM  Nursing: Meriam Sprague RN; Armando Reichert RN 08/21/2017 8:25 AM  RN Care Manager: Onnie Boer CM 08/21/2017  8:25 AM  Social Worker: [employer], LCSW 08/21/2017 8:25 AM  Recreational Therapist: x 08/21/2017 8:25 AM  Other: Armandina Stammer NP; Hillery Jacks NP; Feliz Beam Money NP 08/21/2017 8:25 AM  Other:  08/21/2017 8:25 AM  Other: 08/21/2017 8:25 AM    Scribe for Treatment Team: Ledell Peoples Smart, LCSW 08/21/2017 8:25 AM

## 2017-08-21 NOTE — Plan of Care (Signed)
Patient verbalizes understanding of information, education provided. 

## 2017-08-21 NOTE — [facility] Group Notes (Signed)
[facility] 08/21/2017 1:15pm  Type of Therapy: Mental Health Association Presentation  Participation Level: Active  Participation Quality: Attentive  Affect: Appropriate  Cognitive: Oriented  Insight: Developing/Improving  Engagement in Therapy: Engaged  Modes of Intervention: Discussion, Education and Socialization  Summary of Progress/Problems: Mental Health Association (MHA) Speaker came to talk about his personal journey with mental health. The pt processed ways by which to relate to the speaker. MHA speaker provided handouts and educational information pertaining to groups and services offered by the [facility]. Pt was engaged in speaker's presentation and was receptive to resources provided.    Pulte HomesHeather N Smart, LCSW 08/21/2017 3:11 PM

## 2017-08-22 ENCOUNTER — Ambulatory Visit (HOSPITAL_COMMUNITY): Payer: Self-pay

## 2017-08-22 LAB — LIPID PANEL
CHOL/HDL RATIO: 3.7 ratio
Cholesterol: 209 mg/dL — ABNORMAL HIGH (ref 0–200)
HDL: 57 mg/dL (ref >40)
LDL CALC: 124 mg/dL — AB (ref 0–99)
Triglycerides: 140 mg/dL (ref <150)
VLDL: 28 mg/dL (ref 0–40)

## 2017-08-22 LAB — TSH: TSH: 3.612 u[IU]/mL (ref 0.350–4.500)

## 2017-08-22 LAB — HEMOGLOBIN A1C
Hgb A1c MFr Bld: 5.4 % (ref 4.8–5.6)
MEAN PLASMA GLUCOSE: 108.28 mg/dL

## 2017-08-22 NOTE — [facility] Group Notes (Signed)
LCSW Group Therapy Note   08/22/2017 1:15pm   Type of Therapy and Topic:  Group Therapy:  Overcoming Obstacles   Participation Level:  Active   Description of Group:    In this group patients will be encouraged to explore what they see as obstacles to their own wellness and recovery. They will be guided to discuss their thoughts, feelings, and behaviors related to these obstacles. The group will process together ways to cope with barriers, with attention given to specific choices patients can make. Each patient will be challenged to identify changes they are motivated to make in order to overcome their obstacles. This group will be process-oriented, with patients participating in exploration of their own experiences as well as giving and receiving support and challenge from other group members.   Therapeutic Goals: 1. Patient will identify personal and current obstacles as they relate to admission. 2. Patient will identify barriers that currently interfere with their wellness or overcoming obstacles.  3. Patient will identify feelings, thought process and behaviors related to these barriers. 4. Patient will identify two changes they are willing to make to overcome these obstacles:      Summary of Patient Progress   Tinnie GensJeffrey was attentive and engaged during today's processing group. He shared that [facility] and wants to get into residential treatment "the sooner the better." Tinnie GensJeffrey states that his family is supportive of this plan and will help care for he elderly mother while he pursues treatment. Trey PaulaJeff continues to demonstrate progress in the group setting with improving insight.    Therapeutic Modalities:   Cognitive Behavioral Therapy Solution Focused Therapy Motivational Interviewing Relapse Prevention Therapy  Ledell PeoplesHeather N Smart, LCSW 08/22/2017 3:34 PM

## 2017-08-22 NOTE — Progress Notes (Signed)
[facility] MD Progress Note  08/22/2017 1:56 PM Tim [name]  MRN:  [id number]   Subjective:  Patient reports that he feels really good today. He denies any SI/HI/AVH and depression. He states that he feels blessed for receiving this help and that the social worker is helping go to a residential facility.  Objective: Patient's chart and findings reviewed and discussed with treatment team. Patient is pleasant and cooperative. He has been seen in the day room and interacting with peers and staff. Patient will continue current medications. CSW is working on disposition to [facility] or [facility].  Principal Problem: Severe recurrent major depression without psychotic features (HCC) Diagnosis:   Patient Active Problem List   Diagnosis Date Noted  . Severe recurrent major depression without psychotic features (HCC) [F33.2] 08/20/2017  . Severe episode of recurrent major depressive disorder, without psychotic features (HCC) [F33.2] 07/08/2016  . MDD (major depressive disorder), recurrent severe, without psychosis (HCC) [F33.2] 05/23/2016  . Hepatitis C [B19.20] 05/23/2016  . Alcohol use disorder, severe, dependence (HCC) [F10.20] 11/05/2015  . 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 GAD (generalized anxiety disorder) [F41.1] 11/05/2015  . Cellulitis of right lower extremity [L03.115] 03/29/2015   Total Time spent with patient: 15 minutes  Past Psychiatric History: See H&P  Past Medical History:  Past Medical History:  Diagnosis Date  . Alcoholism (HCC)   . Anxiety   . COPD (chronic obstructive pulmonary disease) (HCC)   . Depression   . Hep C w/o coma, chronic (HCC)   . History of hiatal hernia    hernia rt groin  . Mental health disorder   . Scoliosis   . Thyroid disease     Past Surgical History:  Procedure Laterality Date  . SKIN CANCER EXCISION    . THYROID SURGERY     Family History:  Family History  Problem Relation Age of Onset  . Dementia Mother     Family Psychiatric  History: See H&P Social History:  Social History   Substance and Sexual Activity  Alcohol Use Yes   Comment: drank 1/5 today.     Social History   Substance and Sexual Activity  Drug Use Yes  . Types: Cocaine, Marijuana   Comment: also states will take pain pills every now and then, last use of cocaine and ETOH today at 1400    Social History   Socioeconomic History  . Marital status: Single    Spouse name: None  . Number of children: None  . Years of education: None  . Highest education level: None  Social Needs  . Financial resource strain: None  . Food insecurity - worry: None  . Food insecurity - inability: None  . Transportation needs - medical: None  . Transportation needs - non-medical: None  Occupational History  . None  Tobacco Use  . Smoking status: Current Every Day Smoker    Packs/day: 1.00    Years: 40.00    Pack years: 40.00    Types: Cigarettes  . Smokeless tobacco: Never Used  Substance and Sexual Activity  . Alcohol use: Yes    Comment: drank 1/5 today.  . Drug use: Yes    Types: Cocaine, Marijuana    Comment: also states will take pain pills every now and then, last use of cocaine and ETOH today at 1400  . Sexual activity: No    Birth control/protection: None  Other Topics Concern  . None  Social History Narrative   ** Merged History  Encounter **       Additional Social History:                         Sleep: Good  Appetite:  Good  Current Medications: Current Facility-Administered Medications  Medication Dose Route Frequency Provider Last Rate Last Dose  . acetaminophen (TYLENOL) tablet 650 mg  650 mg Oral Q6H PRN Nira ConnBerry, Jason A, NP   650 mg at 08/21/17 1211  . alum & mag hydroxide-simeth (MAALOX/MYLANTA) 200-200-20 MG/5ML suspension 30 mL  30 mL Oral Q4H PRN Nira ConnBerry, Jason A, NP   30 mL at 08/21/17 1819  . chlordiazePOXIDE (LIBRIUM) capsule 25 mg  25 mg Oral Q6H PRN Nira ConnBerry, Jason A, NP      .  chlordiazePOXIDE (LIBRIUM) capsule 25 mg  25 mg Oral TID Nira ConnBerry, Jason A, NP   25 mg at 08/22/17 1112   Followed by  . [START ON 08/23/2017] chlordiazePOXIDE (LIBRIUM) capsule 25 mg  25 mg Oral BH-qamhs Jackelyn PolingBerry, Jason A, NP       Followed by  . [START ON 08/24/2017] chlordiazePOXIDE (LIBRIUM) capsule 25 mg  25 mg Oral Daily Nira ConnBerry, Jason A, NP      . hydrOXYzine (ATARAX/VISTARIL) tablet 25 mg  25 mg Oral Q6H PRN Nira ConnBerry, Jason A, NP   25 mg at 08/22/17 1112  . loperamide (IMODIUM) capsule 2-4 mg  2-4 mg Oral PRN Nira ConnBerry, Jason A, NP      . magnesium hydroxide (MILK OF MAGNESIA) suspension 30 mL  30 mL Oral Daily PRN Nira ConnBerry, Jason A, NP      . nicotine (NICODERM CQ - dosed in mg/24 hours) patch 21 mg  21 mg Transdermal Daily Cobos, Rockey SituFernando A, MD   21 mg at 08/22/17 0824  . ondansetron (ZOFRAN-ODT) disintegrating tablet 4 mg  4 mg Oral Q6H PRN Nira ConnBerry, Jason A, NP      . pneumococcal 23 valent vaccine (PNU-IMMUNE) injection 0.5 mL  0.5 mL Intramuscular Tomorrow-1000 Cobos, Fernando A, MD      . traZODone (DESYREL) tablet 50 mg  50 mg Oral QHS,MR X 1 Donell SievertSimon, Spencer E, PA-C   50 mg at 08/21/17 2356  . venlafaxine XR (EFFEXOR-XR) 24 hr capsule 37.5 mg  37.5 mg Oral Q breakfast Cobos, Rockey SituFernando A, MD   37.5 mg at 08/22/17 16100822    Lab Results:  Results for orders placed or performed during the hospital encounter of 08/20/17 (from the past 48 hour(s))  Hemoglobin A1c     Status: None   Collection Time: 08/22/17  6:43 AM  Result Value Ref Range   Hgb A1c MFr Bld 5.4 4.8 - 5.6 %    Comment: (NOTE) Pre diabetes:          5.7%-6.4% Diabetes:              >6.4% Glycemic control for   <7.0% adults with diabetes    Mean Plasma Glucose 108.28 mg/dL    Comment: Performed at [facility], [address] [place], [place] [address]  Lipid panel     Status: Abnormal   Collection Time: 08/22/17  6:43 AM  Result Value Ref Range   Cholesterol 209 (H) 0 - 200 mg/dL   Triglycerides 409140 <811<150 mg/dL   HDL 57 >91>40  mg/dL   Total CHOL/HDL Ratio 3.7 RATIO   VLDL 28 0 - 40 mg/dL   LDL Cholesterol 478124 (H) 0 - 99 mg/dL    Comment:  Total Cholesterol/HDL:CHD Risk Coronary Heart Disease Risk Table                     Men   Women  1/2 Average Risk   3.4   3.3  Average Risk       5.0   4.4  2 X Average Risk   9.6   7.1  3 X Average Risk  23.4   11.0        Use the calculated Patient Ratio above and the CHD Risk Table to determine the patient's CHD Risk.        ATP III CLASSIFICATION (LDL):  <100     mg/dL   Optimal  161-096  mg/dL   Near or Above                    Optimal  130-159  mg/dL   Borderline  045-409  mg/dL   High  >811     mg/dL   Very High Performed at [facility], [address], [place], [place] [address]   TSH     Status: None   Collection Time: 08/22/17  6:43 AM  Result Value Ref Range   TSH 3.612 0.350 - 4.500 uIU/mL    Comment: Performed by a 3rd Generation assay with a functional sensitivity of <=0.01 uIU/mL. Performed at [facility], [address], [place], [place] [address]     Blood Alcohol level:  Lab Results  Component Value Date   ETH 103 (H) 08/19/2017   ETH 104 (H) 04/10/2017    Metabolic Disorder Labs: Lab Results  Component Value Date   HGBA1C 5.4 08/22/2017   MPG 108.28 08/22/2017   No results found for: PROLACTIN Lab Results  Component Value Date   CHOL 209 (H) 08/22/2017   TRIG 140 08/22/2017   HDL 57 08/22/2017   CHOLHDL 3.7 08/22/2017   VLDL 28 08/22/2017   LDLCALC 124 (H) 08/22/2017    Physical Findings: AIMS: Facial and Oral Movements Muscles of Facial Expression: None, normal Lips and Perioral Area: None, normal Jaw: None, normal Tongue: None, normal,Extremity Movements Upper (arms, wrists, hands, fingers): None, normal Lower (legs, knees, ankles, toes): None, normal, Trunk Movements Neck, shoulders, hips: None, normal, Overall Severity Severity of abnormal movements (highest score from questions  above): None, normal Incapacitation due to abnormal movements: None, normal Patient's awareness of abnormal movements (rate only patient's report): No Awareness, Dental Status Current problems with teeth and/or dentures?: No Does patient usually wear dentures?: No  CIWA:  CIWA-Ar Total: 3 COWS:     Musculoskeletal: Strength & Muscle Tone: within normal limits Gait & Station: normal Patient leans: N/A  Psychiatric Specialty Exam: Physical Exam  Nursing note and vitals reviewed. Constitutional: He is oriented to person, place, and time. He appears well-developed and well-nourished.  Cardiovascular: Normal rate.  Respiratory: Effort normal.  Musculoskeletal: Normal range of motion.  Neurological: He is alert and oriented to person, place, and time.  Skin: Skin is warm.    Review of Systems  Constitutional: Negative.   HENT: Negative.   Eyes: Negative.   Respiratory: Negative.   Cardiovascular: Negative.   Gastrointestinal: Negative.   Genitourinary: Negative.   Musculoskeletal: Negative.   Skin: Negative.   Neurological: Negative.   Endo/Heme/Allergies: Negative.   Psychiatric/Behavioral: Negative for depression, hallucinations and suicidal ideas. The patient is not nervous/anxious.     Blood pressure 113/68, pulse 72, temperature (!) 97.5 F (36.4 C),  temperature source Oral, resp. rate 16, height 6\' 2"  (1.88 m), weight 77.1 kg (170 lb).Body mass index is 21.83 kg/m.  General Appearance: Casual  Eye Contact:  Good  Speech:  Clear and Coherent and Normal Rate  Volume:  Normal  Mood:  Euthymic  Affect:  Appropriate  Thought Process:  Goal Directed and Descriptions of Associations: Intact  Orientation:  Full (Time, Place, and Person)  Thought Content:  WDL  Suicidal Thoughts:  No  Homicidal Thoughts:  No  Memory:  Immediate;   Good Recent;   Good Remote;   Good  Judgement:  Fair  Insight:  Good  Psychomotor Activity:  Normal  Concentration:  Concentration: Good and  Attention Span: Good  Recall:  Good  Fund of Knowledge:  Good  Language:  Good  Akathisia:  No  Handed:  Right  AIMS (if indicated):     Assets:  Communication Skills Desire for Improvement Financial Resources/Insurance  ADL's:  Intact  Cognition:  WNL  Sleep:  Number of Hours: 3.75   Problems Addressed: MDD severe GAD Alcohol dependence  Treatment Plan Summary: Daily contact with patient to assess and evaluate symptoms and progress in treatment, Medication management and Plan is to:  -Continue Librium Protocol -Continue Effexor-XR 37.5 mg PO Daily for mood stability -Continue Vistaril 25 mg PO Q6H PRN for anxiety -Encourage group therapy participation  Maryfrances Bunnellravis B Mackayla Mullins, FNP 08/22/2017, 1:56 PM
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
## 2017-08-22 NOTE — Progress Notes (Signed)
Recreation Therapy Notes  Animal-Assisted Activity (AAA) Program Checklist/Progress Notes Patient Eligibility Criteria Checklist & Daily Group note for Rec TxIntervention  Date: 11.06.2018 Time: 2:45pm Location: [address] Dayroom   AAA/T Program Assumption of Risk Form signed by Patient/ or Parent Legal Guardian Yes  Patient is free of allergies or sever asthma Yes  Patient reports no fear of animals Yes  Patient reports no history of cruelty to animals Yes  Patient understands his/her participation is voluntary Yes  Behavioral Response: Did not attend.   Tim [name] L Tim [name], LRT/CTRS         Tim [name] L 08/22/2017 2:54 PM 

## 2017-08-22 NOTE — Patient Outreach (Signed)
CPSS met with the patient, and the patient wants inpatient substance use treatment at [facility]. Patient was very happy to meet with CPSS, and he plans to follow up with Aaron Edelman or I when he is discharged from [facility] inpatient.
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
## 2017-08-22 NOTE — Progress Notes (Signed)
Adult Psychoeducational Group Note  Date:  08/22/2017 Time:  4:06 AM  Group Topic/Focus:  Wrap-Up Group:   The focus of this group is to help patients review their daily goal of treatment and discuss progress on daily workbooks.  Participation Level:  Active  Participation Quality:  Appropriate  Affect:  Appropriate  Cognitive:  Appropriate  Insight: Appropriate  Engagement in Group:  Engaged  Modes of Intervention:  Discussion  Additional Comments:  Pt attended the AA group session this evening.  Tim [name] 08/22/2017, 4:06 AM

## 2017-08-22 NOTE — Tx Team (Signed)
Initial Treatment Plan 08/22/2017 12:37 AM Delmar LandauJeffrey L Kauth ZOX:[id number]    PATIENT STRESSORS: Financial difficulties Loss of employment Other: increased depression   PATIENT STRENGTHS: Ability for insight Average or above average intelligence Communication skills General fund of knowledge Motivation for treatment/growth   PATIENT IDENTIFIED PROBLEMS: Depression  "I still feel suicidal"  Substance abuse  Self injury               DISCHARGE CRITERIA:  Ability to meet basic life and health needs Improved stabilization in mood, thinking, and/or behavior Motivation to continue treatment in a less acute level of care Verbal commitment to aftercare and medication compliance  PRELIMINARY DISCHARGE PLAN: Attend aftercare/continuing care group Outpatient therapy  PATIENT/FAMILY INVOLVEMENT: This treatment plan has been presented to and reviewed with the patient, Delmar LandauJeffrey L Almanza. The patient and family have been given the opportunity to ask questions and make suggestions.  Aurora Maskwyman, Aidee Latimore E, RN 08/22/2017, 12:37 AM

## 2017-08-22 NOTE — Progress Notes (Signed)
Patient ID: Tim LandauJeffrey L [name], male   DOB: 1956-02-02, 61 y.o.   MRN: [id number]  Pt currently presents with a brighter affect and anxious, guarded behavior. Pt reports to Clinical research associatewriter that he feels he is "getting a lot out of being here." Pt reports that a family member came to see him today and that his family is supportive of him. Pt reports difficulty sleeping with current medication regimen.   Pt provided with medications per providers orders. Pt's labs and vitals were monitored throughout the night. Pt given a 1:1 about emotional and mental status. Pt supported and encouraged to express concerns and questions. Pt educated on medications and relaxation coping skills.   Pt's safety ensured with 15 minute and environmental checks. Pt currently denies SI/HI and A/V hallucinations. Pt verbally agrees to seek staff if SI/HI or A/VH occurs and to consult with staff before acting on any harmful thoughts. Will continue POC.

## 2017-08-23 MED ORDER — TRAZODONE HCL 100 MG PO TABS
100.0000 mg | ORAL_TABLET | Freq: Every day | ORAL | Status: DC
  Administered 2017-08-23: 100 mg via ORAL
  Filled 2017-08-23: qty 21
  Filled 2017-08-23 (×2): qty 1
  Filled 2017-08-23: qty 21

## 2017-08-23 NOTE — Progress Notes (Signed)
Recreation Therapy Notes  Date: 08/23/17 Time: 0930 Location: 300 [address] Dayroom  Group Topic: Stress Management  Goal Area(s) Addresses:  Patient will verbalize importance of using healthy stress management.  Patient will identify positive emotions associated with healthy stress management.   Intervention: Stress Management  Activity :  Guided Imagery.  LRT introduced the stress management technique of guided imagery.  LRT read a script to allow patients to take a mental vacation to escape their daily routine for a few minutes.  Education: Stress Management, Discharge Planning.   Education Outcome: Acknowledges edcuation/In group clarification offered/Needs additional education  Clinical Observations/Feedback: Pt did not attend group.    Enrika Aguado, LRT/CTRS          Yaziel Brandon A 08/23/2017 12:01 PM 

## 2017-08-23 NOTE — Progress Notes (Signed)
D: Pt present with  very flat affect and calm mood on the unit today. Pt also continues to be present in the milieu observed interacting with peers. Pt reported this morning that he was ready for discharge and has been on the phone making phone calls to arrange for discharge. Reports good night sleep, good appetite, low energy, and poor concentration. Pt reported that his depression was a 5,his hopelessness was a 1, and that his anxiety was a 6. Pt reported being negative SI/HI, no AH/VH noted. A: 15 min checks continued for patient safety. R: Pts safety maintained.

## 2017-08-23 NOTE — Progress Notes (Signed)
Patient ID: Tim LandauJeffrey L [name], male   DOB: [birth year], 61 y.o.   MRN: [id number]  Unable to complete wound care, no dressing materials available. Unit director to have supplies sent this morning. Will report to oncoming RN.

## 2017-08-23 NOTE — Progress Notes (Signed)
Adult Psychoeducational Group Note  Date:  08/23/2017 Time:  5:14 AM  Group Topic/Focus:  Wrap-Up Group:   The focus of this group is to help patients review their daily goal of treatment and discuss progress on daily workbooks.  Participation Level:  Active  Participation Quality:  Appropriate  Affect:  Appropriate  Cognitive:  Appropriate  Insight: Appropriate and Good  Engagement in Group:  Engaged  Modes of Intervention:  Discussion  Additional Comments:  Pt stated that his goal for today was to talk with his Social worker about his after care placement. Pt stated he accomplished his goal today. Pt rated his over all day a 5 out of 10.  Felipa FurnaceChristopher  Unknown Flannigan 08/23/2017, 5:14 AM

## 2017-08-23 NOTE — Progress Notes (Signed)
Pt wounds assessed, no swellings, no drainage, dry and open to air. No dressing required.

## 2017-08-23 NOTE — [facility] Group Notes (Signed)
[facility] 08/23/2017 1:15pm  Type of Therapy: Mental Health Association Presentation  Participation Level: Active  Participation Quality: Attentive  Affect: Appropriate  Cognitive: Oriented  Insight: Developing/Improving  Engagement in Therapy: Engaged  Modes of Intervention: Discussion, Education and Socialization  Summary of Progress/Problems: Mental Health Association (MHA) Speaker came to talk about his personal journey with mental health. The pt processed ways by which to relate to the speaker. MHA speaker provided handouts and educational information pertaining to groups and services offered by the [facility]. Pt was engaged in speaker's presentation and was receptive to resources provided.    Pulte HomesHeather N Smart, LCSW 08/23/2017 4:12 PM

## 2017-08-23 NOTE — Progress Notes (Signed)
[facility]  08/23/2017 3:32 PM Tim LandauJeffrey L [name]  MRN:  [id number]   Subjective:  Patient reports he was disappointed this morning because he misunderstood and thought he was leaving today for [facility], but is feeling good because he gets to leave tomorrow. He denies any SI/HI/AVH and contracts for safety. He denies any withdrawal symptoms as well.  Objective: Patient's chart and findings reviewed and discussed with treatment team. Patinet is pleasant and cooperative. [facility] and get started on treatment.    Principal Problem: Severe recurrent major depression without psychotic features (HCC) Diagnosis:   Patient Active Problem List   Diagnosis Date Noted  . Severe recurrent major depression without psychotic features (HCC) [F33.2] 08/20/2017  . Severe episode of recurrent major depressive disorder, without psychotic features (HCC) [F33.2] 07/08/2016  . MDD (major depressive disorder), recurrent severe, without psychosis (HCC) [F33.2] 05/23/2016  . Hepatitis C [B19.20] 05/23/2016  . Alcohol use disorder, severe, dependence (HCC) [F10.20] 11/05/2015  . 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 GAD (generalized anxiety disorder) [F41.1] 11/05/2015  . Cellulitis of right lower extremity [L03.115] 03/29/2015   Total Time spent with patient: 15 minutes  Past Psychiatric History: See H&P  Past Medical History:  Past Medical History:  Diagnosis Date  . Alcoholism (HCC)   . Anxiety   . COPD (chronic obstructive pulmonary disease) (HCC)   . Depression   . Hep C w/o coma, chronic (HCC)   . History of hiatal hernia    hernia rt groin  . Mental health disorder   . Scoliosis   . Thyroid disease     Past Surgical History:  Procedure Laterality Date  . SKIN CANCER EXCISION    . THYROID SURGERY     Family History:  Family History  Problem Relation Age of Onset  . Dementia Mother    Family Psychiatric  History: See  H&P Social History:  Social History   Substance and Sexual Activity  Alcohol Use Yes   Comment: drank 1/5 today.     Social History   Substance and Sexual Activity  Drug Use Yes  . Types: Cocaine, Marijuana   Comment: also states will take pain pills every now and then, last use of cocaine and ETOH today at 1400    Social History   Socioeconomic History  . Marital status: Single    Spouse name: None  . Number of children: None  . Years of education: None  . Highest education level: None  Social Needs  . Financial resource strain: None  . Food insecurity - worry: None  . Food insecurity - inability: None  . Transportation needs - medical: None  . Transportation needs - non-medical: None  Occupational History  . None  Tobacco Use  . Smoking status: Current Every Day Smoker    Packs/day: 1.00    Years: 40.00    Pack years: 40.00    Types: Cigarettes  . Smokeless tobacco: Never Used  Substance and Sexual Activity  . Alcohol use: Yes    Comment: drank 1/5 today.  . Drug use: Yes    Types: Cocaine, Marijuana    Comment: also states will take pain pills every now and then, last use of cocaine and ETOH today at 1400  . Sexual activity: No    Birth control/protection: None  Other Topics Concern  . None  Social History Narrative   ** Merged History Encounter **  Additional Social History:        Sleep: Good  Appetite:  Good  Current Medications: Current Facility-Administered Medications  Medication Dose Route Frequency Provider Last Rate Last Dose  . acetaminophen (TYLENOL) tablet 650 mg  650 mg Oral Q6H PRN Nira Conn A, NP   650 mg at 08/21/17 1211  . alum & mag hydroxide-simeth (MAALOX/MYLANTA) 200-200-20 MG/5ML suspension 30 mL  30 mL Oral Q4H PRN Nira Conn A, NP   30 mL at 08/23/17 1052  . chlordiazePOXIDE (LIBRIUM) capsule 25 mg  25 mg Oral Q6H PRN Nira Conn A, NP      . chlordiazePOXIDE (LIBRIUM) capsule 25 mg  25 mg Oral BH-qamhs Nira Conn A, NP   25 mg at 08/23/17 0839   Followed by  . [START ON 08/24/2017] chlordiazePOXIDE (LIBRIUM) capsule 25 mg  25 mg Oral Daily Nira Conn A, NP      . hydrOXYzine (ATARAX/VISTARIL) tablet 25 mg  25 mg Oral Q6H PRN Nira Conn A, NP   25 mg at 08/22/17 2158  . loperamide (IMODIUM) capsule 2-4 mg  2-4 mg Oral PRN Nira Conn A, NP      . magnesium hydroxide (MILK OF MAGNESIA) suspension 30 mL  30 mL Oral Daily PRN Nira Conn A, NP      . nicotine (NICODERM CQ - dosed in mg/24 hours) patch 21 mg  21 mg Transdermal Daily Cobos, Rockey Situ, MD   21 mg at 08/23/17 0839  . ondansetron (ZOFRAN-ODT) disintegrating tablet 4 mg  4 mg Oral Q6H PRN Nira Conn A, NP      . pneumococcal 23 valent vaccine (PNU-IMMUNE) injection 0.5 mL  0.5 mL Intramuscular Tomorrow-1000 Cobos, Rockey Situ, MD      . traZODone (DESYREL) tablet 100 mg  100 mg Oral QHS Simon, Spencer E, PA-C      . venlafaxine XR (EFFEXOR-XR) 24 hr capsule 37.5 mg  37.5 mg Oral Q breakfast Cobos, Rockey Situ, MD   37.5 mg at 08/23/17 1610    Lab Results:  Results for orders placed or performed during the hospital encounter of 08/20/17 (from the past 48 hour(s))  Hemoglobin A1c     Status: None   Collection Time: 08/22/17  6:43 AM  Result Value Ref Range   Hgb A1c MFr Bld 5.4 4.8 - 5.6 %    Comment: (NOTE) Pre diabetes:          5.7%-6.4% Diabetes:              >6.4% Glycemic control for   <7.0% adults with diabetes    Mean Plasma Glucose 108.28 mg/dL    Comment: Performed at [facility], [address] [place], [place] [address]  Lipid panel     Status: Abnormal   Collection Time: 08/22/17  6:43 AM  Result Value Ref Range   Cholesterol 209 (H) 0 - 200 mg/dL   Triglycerides 409 <811 mg/dL   HDL 57 >91 mg/dL   Total CHOL/HDL Ratio 3.7 RATIO   VLDL 28 0 - 40 mg/dL   LDL Cholesterol 478 (H) 0 - 99 mg/dL    Comment:        Total Cholesterol/HDL:CHD Risk Coronary Heart Disease Risk Table                     Men    Women  1/2 Average Risk   3.4   3.3  Average Risk       5.0  4.4  2 X Average Risk   9.6   7.1  3 X Average Risk  23.4   11.0        Use the calculated Patient Ratio above and the CHD Risk Table to determine the patient's CHD Risk.        ATP III CLASSIFICATION (LDL):  <100     mg/dL   Optimal  811-914  mg/dL   Near or Above                    Optimal  130-159  mg/dL   Borderline  782-956  mg/dL   High  >213     mg/dL   Very High Performed at [facility], [address], [place], [place] [address]   TSH     Status: None   Collection Time: 08/22/17  6:43 AM  Result Value Ref Range   TSH 3.612 0.350 - 4.500 uIU/mL    Comment: Performed by a 3rd Generation assay with a functional sensitivity of <=0.01 uIU/mL. Performed at [facility], [address], [place], [place] [address]     Blood Alcohol level:  Lab Results  Component Value Date   ETH 103 (H) 08/19/2017   ETH 104 (H) 04/10/2017    Metabolic Disorder Labs: Lab Results  Component Value Date   HGBA1C 5.4 08/22/2017   MPG 108.28 08/22/2017   No results found for: PROLACTIN Lab Results  Component Value Date   CHOL 209 (H) 08/22/2017   TRIG 140 08/22/2017   HDL 57 08/22/2017   CHOLHDL 3.7 08/22/2017   VLDL 28 08/22/2017   LDLCALC 124 (H) 08/22/2017    Physical Findings: AIMS: Facial and Oral Movements Muscles of Facial Expression: None, normal Lips and Perioral Area: None, normal Jaw: None, normal Tongue: None, normal,Extremity Movements Upper (arms, wrists, hands, fingers): None, normal Lower (legs, knees, ankles, toes): None, normal, Trunk Movements Neck, shoulders, hips: None, normal, Overall Severity Severity of abnormal movements (highest score from questions above): None, normal Incapacitation due to abnormal movements: None, normal Patient's awareness of abnormal movements (rate only patient's report): No Awareness, Dental Status Current problems with teeth and/or  dentures?: No Does patient usually wear dentures?: No  CIWA:  CIWA-Ar Total: 0 COWS:     Musculoskeletal: Strength & Muscle Tone: within normal limits Gait & Station: normal Patient leans: N/A  Psychiatric Specialty Exam: Physical Exam  Nursing note and vitals reviewed. Constitutional: He is oriented to person, place, and time. He appears well-developed and well-nourished.  Cardiovascular: Normal rate.  Respiratory: Effort normal.  Musculoskeletal: Normal range of motion.  Neurological: He is alert and oriented to person, place, and time.  Skin: Skin is warm.    Review of Systems  Constitutional: Negative.   HENT: Negative.   Eyes: Negative.   Respiratory: Negative.   Cardiovascular: Negative.   Gastrointestinal: Negative.   Genitourinary: Negative.   Musculoskeletal: Negative.   Skin: Negative.   Neurological: Negative.   Endo/Heme/Allergies: Negative.   Psychiatric/Behavioral: Negative.     Blood pressure 125/72, pulse 74, temperature 98 F (36.7 C), temperature source Oral, resp. rate 16, height 6\' 2"  (1.88 m), weight 77.1 kg (170 lb).Body mass index is 21.83 kg/m.  General Appearance: Casual  Eye Contact:  Good  Speech:  Clear and Coherent and Normal Rate  Volume:  Normal  Mood:  Euthymic  Affect:  Appropriate  Thought Process:  Goal Directed and Descriptions of Associations: Intact  Orientation:  Full (  Time, Place, and Person)  Thought Content:  WDL  Suicidal Thoughts:  No  Homicidal Thoughts:  No  Memory:  Immediate;   Good Recent;   Good Remote;   Good  Judgement:  Good  Insight:  Good  Psychomotor Activity:  Normal  Concentration:  Concentration: Good and Attention Span: Good  Recall:  Good  Fund of Knowledge:  Good  Language:  Good  Akathisia:  No  Handed:  Right  AIMS (if indicated):     Assets:  Communication Skills Desire for Improvement Financial Resources/Insurance Housing Social Support  ADL's:  Intact  Cognition:  WNL  Sleep:  Number  of Hours: 5.75     Treatment Plan Summary: Daily contact with patient to assess and evaluate symptoms and progress in treatment, Medication management and Plan is to:  -Continue Librium Protocol -Continue Effexor-XR 37.5 mg PO Daily for mood stability -Continue Vistaril 25 mg PO Q6H PRN for anxiety -Encourage group therapy participation -[facility]    Maryfrances Bunnellravis B Raidyn Wassink, FNP 08/23/2017, 3:32 PM

## 2017-08-23 NOTE — Progress Notes (Signed)
Nursing Progress Note 1900-0730  D) Patient presents calm, pleasant and cooperative. Patient was observed up in the milieu and attended group. Patient denies SI/HI/AVH or pain. Patient contracts for safety on the unit. Patient reports sleeping well with current regimen. Patient compliant with medications. Patient refused wound care from writer this evening; patient educated about provider orders. Patient reports he will "do it tomorrow". Patient appears to be free of and denies withdrawal symptoms.   A) Emotional support given. 1:1 interaction and active listening provided. Patient medicated as prescribed. Medications and plan of care reviewed with patient. Patient verbalized understanding without further questions. Snacks and fluids provided. Opportunities for questions or concerns presented to patient. Patient encouraged to continue to work on treatment goals. Labs, vital signs and patient behavior monitored throughout shift. Patient safety maintained with q15 min safety checks. Low fall risk precautions in place and reviewed with patient; patient verbalized understanding.  R) Patient receptive to interaction with nurse. Patient remains safe on the unit at this time. Patient denies any adverse medication reactions at this time. Patient is resting in bed without complaints. Will continue to monitor.

## 2017-08-24 MED ORDER — TRAZODONE HCL 100 MG PO TABS
100.0000 mg | ORAL_TABLET | Freq: Once | ORAL | Status: DC
  Filled 2017-08-24: qty 1

## 2017-08-24 MED ORDER — TRAZODONE HCL 100 MG PO TABS: 100.0000 mg | ORAL_TABLET | Freq: Every day | ORAL | 0 refills | Status: DC

## 2017-08-24 MED ORDER — VENLAFAXINE HCL ER 37.5 MG PO CP24: 37.5000 mg | ORAL_CAPSULE | Freq: Every day | ORAL | 0 refills | Status: DC

## 2017-08-24 NOTE — [facility] Suicide Risk Assessment (Signed)
[facility]   Principal Problem: Severe recurrent major depression without psychotic features [facility](HCC) Discharge Diagnoses: MDD                                         SIMD Patient Active Problem List   Diagnosis Date Noted  . Severe recurrent major depression without psychotic features (HCC) [F33.2] 08/20/2017  . Severe episode of recurrent major depressive disorder, without psychotic features (HCC) [F33.2] 07/08/2016  . MDD (major depressive disorder), recurrent severe, without psychosis (HCC) [F33.2] 05/23/2016  . Hepatitis C [B19.20] 05/23/2016  . Alcohol use disorder, severe, dependence (HCC) [F10.20] 11/05/2015  . 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 GAD (generalized anxiety disorder) [F41.1] 11/05/2015  . Cellulitis of right lower extremity [L03.115] 03/29/2015    Total Time spent with patient: 30 minutes  Musculoskeletal: Strength & Muscle Tone: within normal limits Gait & Station: Broad based Patient leans: N/A  Psychiatric Specialty Exam: Review of Systems  Constitutional: Negative.   HENT: Negative.   Eyes: Negative.   Respiratory: Negative.   Cardiovascular: Negative.   Gastrointestinal: Negative.   Genitourinary: Negative.   Musculoskeletal: Negative.   Skin: Negative.   Neurological: Negative.   Endo/Heme/Allergies: Negative.   Psychiatric/Behavioral: Negative for depression, hallucinations, memory loss, substance abuse and suicidal ideas. The patient is not nervous/anxious and does not have insomnia.     Blood pressure 116/71, pulse 73, temperature 98 F (36.7 C), temperature source Oral, resp. rate 16, height 6\' 2"  (1.88 m), weight 77.1 kg (170 lb).Body mass index is 21.83 kg/m.  General Appearance: Casually dressed, Not shaky, not sweaty, not confused. Not unsteady, normal conjugate eye movements. Not internally distressed. Appropriate behavior. Good relatedness.  Eye Contact::  Good  Speech:  Clear and  Coherent and Normal Rate  Volume:  Normal  Mood:  Euthymic  Affect:  Appropriate and Full Range  Thought Process:  Linear  Orientation:  Full (Time, Place, and Person)  Thought Content:  No delusional theme. No preoccupation with violent thoughts. No negative ruminations. No obsession.  No hallucination in any modality.   Suicidal Thoughts:  No  Homicidal Thoughts:  No  Memory:  Immediate;   Good Recent;   Good Remote;   Good  Judgement:  Good  Insight:  Good  Psychomotor Activity:  Normal  Concentration:  Good  Recall:  Good  Fund of Knowledge:Good  Language: Good  Akathisia:  Negative  Handed:    AIMS (if indicated):     Assets:  Communication Skills Desire for Improvement Resilience Social Support  Sleep:  Number of Hours: 5.75  Cognition: WNL  ADL's:  Fair   Clinical Assessment::   61 y.o Caucasian male, lives with his mom who is demented. Background history of MDD and SUD. Presented to the ER via EMS. Overdosed on multiple medications while intoxicated. Reports increasing depression in the past couple of weeks. He had been having suicidal thoughts for a couple of days. Reports self mutilation with cigarette butt. Patient called for help himself after the overdose. Expressed positives in his life that he wants to be alive for . Routine labs were essentially normal. UDS was negative.  BAL 103 mg/dl.  Chart reviewed today. Patient discussed at team today. He has been accepted into a rehab.  Seen today. Tells me that alcohol has been his main issue. He gets depressed while under the  influence. Says once he is around sober people he does better. Notes that his spirits has normalized here. He was at a meeting just prior to interview. Says it was a very powerful one. Patient is optimistic about staying sober. Says he now knows that he needs to find a sponsor. [place]  Reports that he is in good spirits. Not feeling depressed. Reports normal  energy and interest. Has been maintaining normal biological functions. He is able to think clearly. He is able to focus on task. His thoughts are not crowded or racing. No evidence of mania. No hallucination in any modality. He is not making any delusional statement. No passivity of will/thought. He is fully in touch with reality. No thoughts of suicide. No thoughts of homicide. No violent thoughts. No overwhelming anxiety. His family remains very supportive. No new stressors.   Nursing staff reports that patient has been appropriate on the unit. Patient has been interacting well with peers. No behavioral issues. Patient has not voiced any suicidal thoughts. Patient has not been observed to be internally stimulated. Patient has been adherent with treatment recommendations. Patient has been tolerating their medication well.    Demographic Factors:  Male and Caucasian  Loss Factors: NA  Historical Factors: Prior suicide attempts and Impulsivity  Risk Reduction Factors:   Sense of responsibility to family, Living with another person, especially a relative, Positive social support, Positive therapeutic relationship and Positive coping skills or problem solving skills  Continued Clinical Symptoms:   as above   Cognitive Features That Contribute To Risk:  None    Suicide Risk:  Minimal: No identifiable suicidal ideation.  Patient is not having any thoughts of suicide at this time. Modifiable risk factors targeted during this admission includes  substance use and related mood disorder. Demographical and historical risk factors cannot be modified. Patient is now engaging well. Patient is reliable and is future oriented. We have buffered patient's support structures. At this point, patient is at low risk of suicide. Patient is aware of the effects of psychoactive substances on decision making process. Patient has been provided with emergency contacts. Patient acknowledges to use resources provided if  unforseen circumstances changes their current risk stratification.    Follow-up Information    [facility] Follow up on 08/31/2017.   Why:  Screening for possible admission on this date at 7:45AM. Please bring 2 week supply/prescriptions for medications, clothing, and [place]. Thank you. Contact information: [address] [place] [address] [phone]        [facility] Follow up.   Why:  Walk in within 3 days of hospital/rehab discharge for hospital follow-up. Walk in hours: Monday-Friday 8am-9am. Thank you.  Contact information: [address] [place] [address] [phone]        [facility] Follow up.   Specialty:  Addiction Medicine Why:  Referral Faxed: 08/22/17. If you are interested in this facility, please continue to contact Shayla in admissions daily to check status of referral and waitlist. Thank you.  Contact information: [address] [place] [address] [phone]           Plan Of Care/Follow-up recommendations:  1. Continue current psychotropic medications 2. Mental health and addiction follow up as arranged.  3. Provided limited quantity of prescriptions.   Georgiann CockerVincent A Nhung Danko, MD 08/24/2017, 8:18 AM

## 2017-08-24 NOTE — Progress Notes (Signed)
  [facility] :  Will you be returning to the same living situation after discharge:  No.[facility]  At discharge, do you have transportation home?: Yes,  [facility] driver will pick him up at 2:00PM Do you have the ability to pay for your medications: Yes,  mental health  Release of information consent forms completed and submitted to medical records by CSW.   Patient to Follow up at: Follow-up Information    [facility] Follow up on 08/31/2017.   Why:  Screening for possible admission on this date at 7:45AM if interested in this facility. Please bring 2 week supply/prescriptions for medications, clothing, and proof [place]. Thank you. Contact information: [address] [place] [address] [phone]        [facility] Follow up.   Why:  Walk in within 3 days of hospital/rehab discharge for hospital follow-up. Walk in hours: Monday-Friday 8am-9am. Thank you.  Contact information: [address] [place] [address] [phone]        [facility] Follow up on 08/24/2017.   Specialty:  Addiction Medicine Why:  [facility]. Driver will pick you up at approximately 2:00PM for transport to faciliyt. Please make sure that you have been provided with a 21 day supply of medications. Thank you.  Contact information: [address] [place] [address] [phone]           Next level of care provider [facility] Link:no  Safety Planning and Suicide Prevention discussed: Yes,  SPE completed with pt; contact attempts made with pt's mother. SPI pamphlet and mobile crisis information provided to pt.   Have you used any form of tobacco in the last 30 days? (Cigarettes, Smokeless Tobacco, Cigars, and/or Pipes): Yes  Has patient been referred to the Quitline?: Patient refused referral  Patient has been referred for  addiction treatment: Yes  Pulte HomesHeather N Smart, LCSW 08/24/2017, 11:17 AM

## 2017-08-24 NOTE — Tx Team (Signed)
Interdisciplinary Treatment and Diagnostic Plan Update  08/24/2017 Time of Session: 0830AM Tim LandauJeffrey L [name] MRN: [id number]  Principal Diagnosis: Severe recurrent major depression without psychotic features [facility](HCC)  Secondary Diagnoses: Principal Problem:   Severe recurrent major depression without psychotic features (HCC) Active Problems:   Alcohol use disorder, severe, dependence (HCC)   GAD (generalized anxiety disorder)   Current Medications:  Current Facility-Administered Medications  Medication Dose Route Frequency Provider Last Rate Last Dose  . acetaminophen (TYLENOL) tablet 650 mg  650 mg Oral Q6H PRN Nira ConnBerry, Jason A, NP   650 mg at 08/21/17 1211  . alum & mag hydroxide-simeth (MAALOX/MYLANTA) 200-200-20 MG/5ML suspension 30 mL  30 mL Oral Q4H PRN Nira ConnBerry, Jason A, NP   30 mL at 08/23/17 1052  . magnesium hydroxide (MILK OF MAGNESIA) suspension 30 mL  30 mL Oral Daily PRN Nira ConnBerry, Jason A, NP      . nicotine (NICODERM CQ - dosed in mg/24 hours) patch 21 mg  21 mg Transdermal Daily Cobos, Rockey SituFernando A, MD   21 mg at 08/24/17 0816  . pneumococcal 23 valent vaccine (PNU-IMMUNE) injection 0.5 mL  0.5 mL Intramuscular Tomorrow-1000 Cobos, Fernando A, MD      . traZODone (DESYREL) tablet 100 mg  100 mg Oral QHS Donell SievertSimon, Spencer E, PA-C   100 mg at 08/23/17 2258  . traZODone (DESYREL) tablet 100 mg  100 mg Oral Once Kerry HoughSimon, Spencer E, PA-C      . venlafaxine XR (EFFEXOR-XR) 24 hr capsule 37.5 mg  37.5 mg Oral Q breakfast Cobos, Rockey SituFernando A, MD   37.5 mg at 08/24/17 0815   PTA Medications: Medications Prior to Admission  Medication Sig Dispense Refill Last Dose  . albuterol (PROVENTIL HFA;VENTOLIN HFA) 108 (90 Base) MCG/ACT inhaler Inhale 2 puffs into the lungs every 6 (six) hours as needed for wheezing or shortness of breath.   unknown  . aspirin 325 MG tablet Take 650 mg by mouth every 6 (six) hours as needed for moderate pain.   Past Week at Unknown time  . FLUoxetine (PROZAC) 40 MG capsule Take  2 capsules (80 mg total) by mouth daily. 60 capsule 0 08/19/2017 at Unknown time  . gabapentin (NEURONTIN) 300 MG capsule Take 1 capsule (300 mg total) by mouth 3 (three) times daily. 90 capsule 0 08/19/2017 at Unknown time  . hydrOXYzine (ATARAX/VISTARIL) 25 MG tablet Take 1 tablet (25 mg total) by mouth every 6 (six) hours as needed for anxiety (or CIWA score </= 10). (Patient not taking: Reported on 08/19/2017) 30 tablet 0 Not Taking at Unknown time  . ibuprofen (ADVIL,MOTRIN) 200 MG tablet Take 400 mg by mouth every 6 (six) hours as needed for moderate pain.   Past Week at Unknown time  . omeprazole (PRILOSEC) 20 MG capsule Take 1 capsule (20 mg total) by mouth daily. (Patient not taking: Reported on 08/19/2017) 30 capsule 1 Not Taking at Unknown time    Patient Stressors: Financial difficulties Loss of employment Other: increased depression  Patient Strengths: Ability for insight Average or above average intelligence Communication skills General fund of knowledge Motivation for treatment/growth  Treatment Modalities: Medication Management, Group therapy, Case management,  1 to 1 session with clinician, Psychoeducation, Recreational therapy.   Physician Treatment Plan for Primary Diagnosis: Severe recurrent major depression without psychotic features (HCC) Long Term Goal(s): Improvement in symptoms so as ready for discharge Improvement in symptoms so as ready for discharge   Short Term Goals: Ability to identify changes in lifestyle to reduce  recurrence of condition will improve Compliance with prescribed medications will improve Ability to identify triggers associated with substance abuse/mental health issues will improve  Medication Management: Evaluate patient's response, side effects, and tolerance of medication regimen.  Therapeutic Interventions: 1 to 1 sessions, Unit Group sessions and Medication administration.  Evaluation of Outcomes: Adequate for Discharge  Physician  Treatment Plan for Secondary Diagnosis: Principal Problem:   Severe recurrent major depression without psychotic features (HCC) Active Problems:   Alcohol use disorder, severe, dependence (HCC)   GAD (generalized anxiety disorder)  Long Term Goal(s): Improvement in symptoms so as ready for discharge Improvement in symptoms so as ready for discharge   Short Term Goals: Ability to identify changes in lifestyle to reduce recurrence of condition will improve Compliance with prescribed medications will improve Ability to identify triggers associated with substance abuse/mental health issues will improve     Medication Management: Evaluate patient's response, side effects, and tolerance of medication regimen.  Therapeutic Interventions: 1 to 1 sessions, Unit Group sessions and Medication administration.  Evaluation of Outcomes: Adequate for Discharge   RN Treatment Plan for Primary Diagnosis: Severe recurrent major depression without psychotic features (HCC) Long Term Goal(s): Knowledge of disease and therapeutic regimen to maintain health will improve  Short Term Goals: Ability to remain free from injury will improve, Ability to disclose and discuss suicidal ideas and Ability to identify and develop effective coping behaviors will improve  Medication Management: RN will administer medications as ordered by provider, will assess and evaluate patient's response and provide education to patient for prescribed medication. RN will report any adverse and/or side effects to prescribing provider.  Therapeutic Interventions: 1 on 1 counseling sessions, Psychoeducation, Medication administration, Evaluate responses to treatment, Monitor vital signs and CBGs as ordered, Perform/monitor CIWA, COWS, AIMS and Fall Risk screenings as ordered, Perform wound care treatments as ordered.  Evaluation of Outcomes: Adequate for Discharge   LCSW Treatment Plan for Primary Diagnosis: Severe recurrent major  depression without psychotic features (HCC) Long Term Goal(s): Safe transition to appropriate next level of care at discharge, Engage patient in therapeutic group addressing interpersonal concerns.  Short Term Goals: Engage patient in aftercare planning with referrals and resources, Facilitate patient progression through stages of change regarding substance use diagnoses and concerns and Identify triggers associated with mental health/substance abuse issues  Therapeutic Interventions: Assess for all discharge needs, 1 to 1 time with Social worker, Explore available resources and support systems, Assess for adequacy in community support network, Educate family and significant other(s) on suicide prevention, Complete Psychosocial Assessment, Interpersonal group therapy.  Evaluation of Outcomes: Adequate for Discharge   Progress in Treatment: Attending groups: Yes. Participating in groups: Yes. Taking medication as prescribed: Yes. Toleration medication: Yes. Family/Significant other contact made: No, will contact:  pt's mother to complete SPE. Patient understands diagnosis: Yes. Discussing patient identified problems/goals with staff: Yes. Medical problems stabilized or resolved: Yes. Denies suicidal/homicidal ideation: Yes. Issues/concerns per patient self-inventory: No. Other:  N/a   New problem(s) identified: No, Describe:  n/a  New Short Term/Long Term Goal(s): detox, medication management for mood stabilization, development of comprehensive mental wellness/sobriety plan.   Discharge Plan or Barriers: [facility]. CSW waiting for final confirmation for admissions. Pt also has [facility] screening on Thursday, 11/15 if he chooses to pursue that treatment facility. [facility] for outpatient mental health services. [facility] pamphlet and AA list provided for additional community support.   Reason for Continuation of Hospitalization:  none  Estimated Length of Stay: Thursday, 08/24/17--pt is tentatively accepted to [facility] for this afternoon. CSW is waiting for final confirmation from [facility] admissions.   Attendees: Patient: 08/24/2017 9:30 AM  Physician: Dr. Altamese Montfort MD; Dr. Jackquline Berlin MD 08/24/2017 9:30 AM  Nursing: Lorine Bears RN 08/24/2017 9:30 AM  RN Care Manager: Onnie Boer CM 08/24/2017 9:30 AM  Social Worker: Trula Slade, LCSW 08/24/2017 9:30 AM  Recreational Therapist: x 08/24/2017 9:30 AM  Other: Armandina Stammer NP; Feliz Beam Money NP 08/24/2017 9:30 AM  Other:  08/24/2017 9:30 AM  Other: 08/24/2017 9:30 AM    Scribe for Treatment Team: Ledell Peoples Smart, LCSW 08/24/2017 9:30 AM

## 2017-08-24 NOTE — [facility] Suicide Risk Assessment (Signed)
[facility] INPATIENT:  Family/Significant Other Suicide Prevention Education  Suicide Prevention Education:  Contact Attempts: Ree ShayBobbie Prows (pt's mother) [phone] has been identified by the patient as the family member/significant other with whom the patient will be residing, and identified as the person(s) who will aid the patient in the event of a mental health crisis.  With written consent from the patient, two attempts were made to provide suicide prevention education, prior to and/or following the patient's discharge.  We were unsuccessful in providing suicide prevention education.  A suicide education pamphlet was given to the patient to share with family/significant other.  Date and time of first attempt: 08/23/17 at 4:05PM (no answer/no voicemail).  Date and time of second attempt: 08/24/17 at 9:51 AM (no answer/no voicemail).   Cheyne Boulden N Smart LCSW 08/24/2017, 9:51 AM

## 2017-08-24 NOTE — Discharge Summary (Signed)
Physician Discharge Summary Note  Patient:  Tim LandauJeffrey L [name] is an 61 y.o., male MRN:  [id number] DOB:  12-13-1955 Patient phone:  [phone] (home)  Patient address:   [address] [place] [id number],  Total Time spent with patient: 20 minutes  Date of Admission:  08/20/2017 Date of Discharge: 08/24/17   Reason for Admission:  Worsening depression with impulsive overdose  Principal Problem: Severe recurrent major depression without psychotic features [facility](HCC) Discharge Diagnoses: Patient Active Problem List   Diagnosis Date Noted  . Severe recurrent major depression without psychotic features (HCC) [F33.2] 08/20/2017  . Severe episode of recurrent major depressive disorder, without psychotic features (HCC) [F33.2] 07/08/2016  . MDD (major depressive disorder), recurrent severe, without psychosis (HCC) [F33.2] 05/23/2016  . Hepatitis C [B19.20] 05/23/2016  . Alcohol use disorder, severe, dependence (HCC) [F10.20] 11/05/2015  . 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 GAD (generalized anxiety disorder) [F41.1] 11/05/2015  . Cellulitis of right lower extremity [L03.115] 03/29/2015    Past Psychiatric History: History of multiple psychiatric admissions " for depression , anxiety, panic attacks" .  Most recent psychiatric admission was in August 2017. Reports history of intermittent auditory hallucinations.  Reports history of mood instability, depression, currently does not endorse history of mania.  Describes chronic symptoms of PTSD as above .  Denies prior history of suicide attempts. Denies prior history of self cutting or self burning in the past .   Past Medical History:  Past Medical History:  Diagnosis Date  . Alcoholism (HCC)   . Anxiety   . COPD (chronic obstructive pulmonary disease) (HCC)   . Depression   . Hep C w/o coma, chronic (HCC)   . History of hiatal hernia    hernia rt groin  . Mental health disorder   . Scoliosis    . Thyroid disease     Past Surgical History:  Procedure Laterality Date  . SKIN CANCER EXCISION    . THYROID SURGERY     Family History:  Family History  Problem Relation Age of Onset  . Dementia Mother    Family Psychiatric  History: father was alcoholic, maternal grandfather was hospitalized in a psychiatric hospital for many years, but does not know why   Social History:  Social History   Substance and Sexual Activity  Alcohol Use Yes   Comment: drank 1/5 today.     Social History   Substance and Sexual Activity  Drug Use Yes  . Types: Cocaine, Marijuana   Comment: also states will take pain pills every now and then, last use of cocaine and ETOH today at 1400    Social History   Socioeconomic History  . Marital status: Single    Spouse name: None  . Number of children: None  . Years of education: None  . Highest education level: None  Social Needs  . Financial resource strain: None  . Food insecurity - worry: None  . Food insecurity - inability: None  . Transportation needs - medical: None  . Transportation needs - non-medical: None  Occupational History  . None  Tobacco Use  . Smoking status: Current Every Day Smoker    Packs/day: 1.00    Years: 40.00    Pack years: 40.00    Types: Cigarettes  . Smokeless tobacco: Never Used  Substance and Sexual Activity  . Alcohol use: Yes    Comment: drank 1/5 today.  . Drug use: Yes    Types: Cocaine, Marijuana  Comment: also states will take pain pills every now and then, last use of cocaine and ETOH today at 1400  . Sexual activity: No    Birth control/protection: None  Other Topics Concern  . None  Social History Narrative   ** Merged History Encounter **        Hospital Course:  08/20/17 ED [facility] Assessment: 61 y.o. male.  -Clinician reviewed note by Dr. Criss Alvine.  61 year old male with a history of hepatitis C, depression, alcohol abuse presents with an overdose. At 6 or 7 PM the patient states  hepatient states he drank a 12 pack, and took 30 tablets of gabapentin 100 mg, 30 tablets Prozac, and Risperdal 30 tablets of 0.5 mg. The patient states he is a little bit dizzy but denies any other acute complaints. No complaints of pain. No vomiting. He did all this because he feels suicidal. Has had worsening depression for several weeks. He also has been burning his arms bilaterally with cigarettes for the last month or so. The forearms appear a little erythematous/pink and he states it is been this way the whole time. Some weeping whenever he pulls up a scab. Patient states he called EMS because he still wants to live and has people to take care of. However he does still feel suicidal Patient still feels suicidal.  He took an overdose of his Prozac and gabapentin.  Patient says that he called 911 shortly after doing this because he got to thinking about the effect it may have on his family.  Patient has also been burning his arms with cigarettes for the last month. Patient denies any HI.  He sometimes hears voices talking to him but it is not often.  No visual hallucinations other than some movement in peripheral vision. Patient has a history of multiple suicide attempts.  He reports drinking about a 6 pack per day.  Today he drank a 12 pack.  Patient denies other drug use. Patient goes to [facility] for med management and therapy.  He has had multiple inpatient psychiatric hospitalizations.  Last one was in [facility] but cannot remember when it was.  08/21/17 [facility] MD Assessment: 61 year old male. He reports he impulsively overdosed on medications ( Prozac, Risperidone , Neurontin) States that after overdose he felt dizzy, and called 911. This occurred 2 days ago. States overdose was unplanned , " spur of the moment". States that he had set up a yard sale and was discouraged because " no one was buying". Another stressor is that " my adopted son took a very expensive tool box and 700 dollars  from me because he is using drugs ".  Patient reports he has a history of alcohol dependence, states he relapsed in March 2018, and is currently drinking about 12 beers per day.  Admission BAL 103, Admission UDS negative  Of note, patient reports he has been engaging in self injurious ideations, and has been burning self with cigarettes . He has multiple burn marks in different stages of healing on forearms.  Endorses neuro-vegetative symptoms of depression as below, endorses intermittent auditory hallucinations.   Patient remained on the [facility] unit for 3 days and stabilized with medication and therapy. Patient was started on Effexor-XR 37.5 mg Daily and Trazodone 100 mg PO QHS for sleep. He also completed the Librium Detox Protocol while here. Patient showed improvement with improved sleep, mood, affect, appetite, and interaction. He has been seen in the day room interacting with peers and staff appropriately.  He has been attending group and participating. He has been pleasant and cooperative. [facility] and [facility]. He is also being supplied prescriptions and samples for [facility]. Patient denies any SI/HI/AVH and contracts for safety.     Physical Findings: AIMS: Facial and Oral Movements Muscles of Facial Expression: None, normal Lips and Perioral Area: None, normal Jaw: None, normal Tongue: None, normal,Extremity Movements Upper (arms, wrists, hands, fingers): None, normal Lower (legs, knees, ankles, toes): None, normal, Trunk Movements Neck, shoulders, hips: None, normal, Overall Severity Severity of abnormal movements (highest score from questions above): None, normal Incapacitation due to abnormal movements: None, normal Patient's awareness of abnormal movements (rate only patient's report): No Awareness, Dental Status Current problems with teeth and/or dentures?: No Does patient usually wear dentures?: No  CIWA:  CIWA-Ar Total: 0 COWS:      Musculoskeletal: Strength & Muscle Tone: within normal limits Gait & Station: normal Patient leans: N/A  Psychiatric Specialty Exam: Physical Exam  Nursing note and vitals reviewed. Constitutional: He is oriented to person, place, and time. He appears well-developed and well-nourished.  Cardiovascular: Normal rate.  Respiratory: Effort normal.  Musculoskeletal: Normal range of motion.  Neurological: He is alert and oriented to person, place, and time.  Skin: Skin is warm.    Review of Systems  Constitutional: Negative.   HENT: Negative.   Eyes: Negative.   Respiratory: Negative.   Cardiovascular: Negative.   Gastrointestinal: Negative.   Genitourinary: Negative.   Musculoskeletal: Negative.   Skin: Negative.   Neurological: Negative.   Endo/Heme/Allergies: Negative.   Psychiatric/Behavioral: Negative.     Blood pressure 116/71, pulse 73, temperature 98 F (36.7 C), temperature source Oral, resp. rate 16, height 6\' 2"  (1.88 m), weight 77.1 kg (170 lb).Body mass index is 21.83 kg/m.  General Appearance: Casual  Eye Contact:  Good  Speech:  Clear and Coherent and Normal Rate  Volume:  Normal  Mood:  Euthymic  Affect:  Appropriate  Thought Process:  Goal Directed and Descriptions of Associations: Intact  Orientation:  Full (Time, Place, and Person)  Thought Content:  WDL  Suicidal Thoughts:  No  Homicidal Thoughts:  No  Memory:  Immediate;   Good Recent;   Good Remote;   Good  Judgement:  Good  Insight:  Good  Psychomotor Activity:  Normal  Concentration:  Concentration: Good and Attention Span: Good  Recall:  Good  Fund of Knowledge:  Good  Language:  Good  Akathisia:  No  Handed:  Right  AIMS (if indicated):     Assets:  Communication Skills Desire for Improvement Financial Resources/Insurance Housing Social Support  ADL's:  Intact  Cognition:  WNL  Sleep:  Number of Hours: 5.75     Have you used any form of tobacco in the last 30 days?  (Cigarettes, Smokeless Tobacco, Cigars, and/or Pipes): Yes  Has this patient used any form of tobacco in the last 30 days? (Cigarettes, Smokeless Tobacco, Cigars, and/or Pipes) Yes, Yes, A prescription for an FDA-approved tobacco cessation medication was offered at discharge and the patient refused  Blood Alcohol level:  Lab Results  Component Value Date   ETH 103 (H) 08/19/2017   ETH 104 (H) 04/10/2017    Metabolic Disorder Labs:  Lab Results  Component Value Date   HGBA1C 5.4 08/22/2017   MPG 108.28 08/22/2017   No results found for: PROLACTIN Lab Results  Component Value Date   CHOL 209 (H) 08/22/2017  TRIG 140 08/22/2017   HDL 57 08/22/2017   CHOLHDL 3.7 08/22/2017   VLDL 28 08/22/2017   LDLCALC 124 (H) 08/22/2017    See Psychiatric Specialty Exam and Suicide Risk Assessment completed by Attending Physician prior to discharge.  Discharge destination:  [facility]  Is patient on multiple antipsychotic therapies at discharge:  No   Has Patient had three or more failed trials of antipsychotic monotherapy by history:  No  Recommended Plan for Multiple Antipsychotic Therapies: NA   Allergies as of 08/24/2017   No Known Allergies     Medication List    STOP taking these medications   albuterol 108 (90 Base) MCG/ACT inhaler Commonly known as:  PROVENTIL HFA;VENTOLIN HFA   aspirin 325 MG tablet   FLUoxetine 40 MG capsule Commonly known as:  PROZAC   gabapentin 300 MG capsule Commonly known as:  NEURONTIN   hydrOXYzine 25 MG tablet Commonly known as:  ATARAX/VISTARIL   ibuprofen 200 MG tablet Commonly known as:  ADVIL,MOTRIN   omeprazole 20 MG capsule Commonly known as:  PRILOSEC     TAKE these medications     Indication  traZODone 100 MG tablet Commonly known as:  DESYREL Take 1 tablet (100 mg total) at bedtime by mouth. For sleep  Indication:  Trouble Sleeping   venlafaxine XR 37.5 MG 24 hr capsule Commonly known as:  EFFEXOR-XR Take 1 capsule (37.5 mg  total) daily with breakfast by mouth. For mood control  Indication:  mood stability      Follow-up Information    Services, [facility] Follow up on 08/31/2017.   Why:  Screening for possible admission on this date at 7:45AM. Please bring 2 week supply/prescriptions for medications, clothing, and proof of [employer] residency. Thank you. Contact information: [address] [place] [address] [phone]        [facility] Follow up.   Why:  Walk in within 3 days of hospital/rehab discharge for hospital follow-up. Walk in hours: Monday-Friday 8am-9am. Thank you.  Contact information: [address] [place] [address] [phone]        [facility] Follow up.   Specialty:  Addiction Medicine Why:  Referral Faxed: 08/22/17. If you are interested in this facility, please continue to contact Shayla in admissions daily to check status of referral and waitlist. Thank you.  Contact information: [address] [place] [address] [phone]           Follow-up recommendations:  Continue activity as tolerated. Continue diet as recommended by your PCP. Ensure to keep all appointments with outpatient providers.  Comments:  Patient is instructed prior to discharge to: Take all medications as prescribed by his/her mental healthcare provider. Report any adverse effects and or reactions from the medicines to his/her outpatient provider promptly. Patient has been instructed & cautioned: To not engage in alcohol and or illegal drug use while on prescription medicines. In the event of worsening symptoms, patient is instructed to call the crisis hotline, 911 and or go to the nearest ED for appropriate evaluation and treatment of symptoms. To follow-up with his/her primary care provider for your other medical issues, concerns and or health care needs.    Signed: Gerlene Burdock Zian Delair, FNP 08/24/2017, 8:02 AM

## 2017-08-24 NOTE — Progress Notes (Signed)
Pt d/c from the hospital. All items returned. D/C instructions given, prescriptions given and samples given. Pt denies si and hi. 

## 2017-10-12 ENCOUNTER — Telehealth (HOSPITAL_COMMUNITY): Payer: Self-pay

## 2017-12-26 ENCOUNTER — Emergency Department (HOSPITAL_COMMUNITY): Payer: Self-pay

## 2017-12-26 ENCOUNTER — Encounter (HOSPITAL_COMMUNITY): Payer: Self-pay | Admitting: Emergency Medicine

## 2017-12-26 ENCOUNTER — Emergency Department (HOSPITAL_COMMUNITY)
Admission: EM | Admit: 2017-12-26 | Discharge: 2017-12-27 | Disposition: A | Discharge status: Home / Self Care | Payer: Self-pay | Attending: Emergency Medicine | Admitting: Emergency Medicine

## 2017-12-26 DIAGNOSIS — Y903 Blood alcohol level of 60-79 mg/100 ml: Secondary | ICD-10-CM | POA: Insufficient documentation

## 2017-12-26 DIAGNOSIS — Z7289 Other problems related to lifestyle: Secondary | ICD-10-CM

## 2017-12-26 DIAGNOSIS — F10229 Alcohol dependence with intoxication, unspecified: Secondary | ICD-10-CM | POA: Insufficient documentation

## 2017-12-26 DIAGNOSIS — F17210 Nicotine dependence, cigarettes, uncomplicated: Secondary | ICD-10-CM | POA: Insufficient documentation

## 2017-12-26 DIAGNOSIS — J449 Chronic obstructive pulmonary disease, unspecified: Secondary | ICD-10-CM | POA: Insufficient documentation

## 2017-12-26 DIAGNOSIS — IMO0002 Reserved for concepts with insufficient information to code with codable children: Secondary | ICD-10-CM

## 2017-12-26 DIAGNOSIS — R45851 Suicidal ideations: Secondary | ICD-10-CM

## 2017-12-26 DIAGNOSIS — F1020 Alcohol dependence, uncomplicated: Secondary | ICD-10-CM

## 2017-12-26 DIAGNOSIS — F332 Major depressive disorder, recurrent severe without psychotic features: Secondary | ICD-10-CM | POA: Insufficient documentation

## 2017-12-26 DIAGNOSIS — X838XXA Intentional self-harm by other specified means, initial encounter: Secondary | ICD-10-CM | POA: Insufficient documentation

## 2017-12-26 LAB — RAPID URINE DRUG SCREEN, HOSP PERFORMED
AMPHETAMINES: NOT DETECTED
BENZODIAZEPINES: NOT DETECTED
Barbiturates: NOT DETECTED
Cocaine: NOT DETECTED
Opiates: NOT DETECTED
TETRAHYDROCANNABINOL: POSITIVE — AB

## 2017-12-26 MED ORDER — ALBUTEROL SULFATE HFA 108 (90 BASE) MCG/ACT IN AERS
2.0000 | INHALATION_SPRAY | RESPIRATORY_TRACT | Status: DC | PRN
Start: 2017-12-26 — End: 2017-12-27

## 2017-12-26 NOTE — ED Notes (Signed)
Bed: [facility] Expected date:  Expected time:  Means of arrival:  Comments: 

## 2017-12-26 NOTE — ED Notes (Signed)
Patient changed into paper scurbs and wanded by security.

## 2017-12-26 NOTE — ED Triage Notes (Signed)
Patient c/o SI without plan worsening x2 weeks. Reports alcohol binge since October. Last drink at 1500 today. Denies drug use.

## 2017-12-26 NOTE — ED Notes (Signed)
Bed: [facility] Expected date:  Expected time:  Means of arrival:  Comments: 

## 2017-12-27 ENCOUNTER — Inpatient Hospital Stay (HOSPITAL_COMMUNITY)
Admission: AD | Admit: 2017-12-27 | Payer: Federal, State, Local not specified - Other | Source: Intra-hospital | Admitting: Psychiatry

## 2017-12-27 LAB — SALICYLATE LEVEL: Salicylate Lvl: <7 mg/dL (ref 2.8–30.0)

## 2017-12-27 LAB — COMPREHENSIVE METABOLIC PANEL
ALBUMIN: 4.1 g/dL (ref 3.5–5.0)
ALK PHOS: 63 U/L (ref 38–126)
ALT: 20 U/L (ref 17–63)
ANION GAP: 11 (ref 5–15)
AST: 21 U/L (ref 15–41)
BUN: 14 mg/dL (ref 6–20)
CALCIUM: 9.7 mg/dL (ref 8.9–10.3)
CO2: 22 mmol/L (ref 22–32)
CREATININE: 1.12 mg/dL (ref 0.61–1.24)
Chloride: 107 mmol/L (ref 101–111)
GFR calc Af Amer: >60 mL/min (ref >60)
GFR calc non Af Amer: >60 mL/min (ref >60)
GLUCOSE: 83 mg/dL (ref 65–99)
Potassium: 3.8 mmol/L (ref 3.5–5.1)
SODIUM: 140 mmol/L (ref 135–145)
Total Bilirubin: 0.6 mg/dL (ref 0.3–1.2)
Total Protein: 7 g/dL (ref 6.5–8.1)

## 2017-12-27 LAB — CBC
HCT: 46.5 % (ref 39.0–52.0)
HEMOGLOBIN: 16.3 g/dL (ref 13.0–17.0)
MCH: 31.7 pg (ref 26.0–34.0)
MCHC: 35.1 g/dL (ref 30.0–36.0)
MCV: 90.3 fL (ref 78.0–100.0)
Platelets: 219 10*3/uL (ref 150–400)
RBC: 5.15 MIL/uL (ref 4.22–5.81)
RDW: 13.7 % (ref 11.5–15.5)
WBC: 7.6 10*3/uL (ref 4.0–10.5)

## 2017-12-27 LAB — ETHANOL: Alcohol, Ethyl (B): 69 mg/dL — ABNORMAL HIGH (ref <10)

## 2017-12-27 LAB — ACETAMINOPHEN LEVEL

## 2017-12-27 MED ORDER — LORAZEPAM 1 MG PO TABS
0.0000 mg | ORAL_TABLET | Freq: Four times a day (QID) | ORAL | Status: DC
  Administered 2017-12-27: 2 mg via ORAL
  Filled 2017-12-27: qty 2

## 2017-12-27 MED ORDER — ALUM & MAG HYDROXIDE-SIMETH 200-200-20 MG/5ML PO SUSP: 30.0000 mL | Freq: Four times a day (QID) | ORAL | Status: DC | PRN

## 2017-12-27 MED ORDER — ESCITALOPRAM OXALATE 10 MG PO TABS: 10.0000 mg | ORAL_TABLET | Freq: Every day | ORAL | 0 refills | Status: DC

## 2017-12-27 MED ORDER — VITAMIN B-1 100 MG PO TABS
100.0000 mg | ORAL_TABLET | Freq: Every day | ORAL | Status: DC
  Administered 2017-12-27: 100 mg via ORAL
  Filled 2017-12-27: qty 1

## 2017-12-27 MED ORDER — THIAMINE HCL 100 MG/ML IJ SOLN: 100.0000 mg | Freq: Every day | INTRAMUSCULAR | Status: DC

## 2017-12-27 MED ORDER — LORAZEPAM 2 MG/ML IJ SOLN: 0.0000 mg | Freq: Four times a day (QID) | INTRAMUSCULAR | Status: DC

## 2017-12-27 MED ORDER — LORAZEPAM 2 MG/ML IJ SOLN: 0.0000 mg | Freq: Two times a day (BID) | INTRAMUSCULAR | Status: DC

## 2017-12-27 MED ORDER — NICOTINE 21 MG/24HR TD PT24
21.0000 mg | MEDICATED_PATCH | Freq: Every day | TRANSDERMAL | Status: DC
  Administered 2017-12-27: 21 mg via TRANSDERMAL
  Filled 2017-12-27: qty 1

## 2017-12-27 MED ORDER — BACITRACIN 500 UNIT/GM EX OINT
1.0000 "application " | TOPICAL_OINTMENT | Freq: Two times a day (BID) | CUTANEOUS | Status: DC
  Filled 2017-12-27: qty 28

## 2017-12-27 MED ORDER — BACITRACIN ZINC 500 UNIT/GM EX OINT: TOPICAL_OINTMENT | Freq: Two times a day (BID) | CUTANEOUS | Status: DC

## 2017-12-27 MED ORDER — HYDROXYZINE HCL 25 MG PO TABS: 25.0000 mg | ORAL_TABLET | Freq: Four times a day (QID) | ORAL | Status: DC | PRN

## 2017-12-27 MED ORDER — TRAZODONE HCL 100 MG PO TABS
100.0000 mg | ORAL_TABLET | Freq: Every day | ORAL | Status: DC
  Administered 2017-12-27: 100 mg via ORAL
  Filled 2017-12-27: qty 1

## 2017-12-27 MED ORDER — GABAPENTIN 100 MG PO CAPS
100.0000 mg | ORAL_CAPSULE | Freq: Three times a day (TID) | ORAL | Status: DC
  Administered 2017-12-27: 100 mg via ORAL
  Filled 2017-12-27: qty 1

## 2017-12-27 MED ORDER — BACITRACIN 500 UNIT/GM EX OINT
1.0000 "application " | TOPICAL_OINTMENT | Freq: Two times a day (BID) | CUTANEOUS | Status: DC
  Administered 2017-12-27 (×2): 1 via TOPICAL
  Filled 2017-12-27: qty 28

## 2017-12-27 MED ORDER — LORAZEPAM 1 MG PO TABS: 0.0000 mg | ORAL_TABLET | Freq: Two times a day (BID) | ORAL | Status: DC

## 2017-12-27 MED ORDER — ESCITALOPRAM OXALATE 10 MG PO TABS
10.0000 mg | ORAL_TABLET | Freq: Every day | ORAL | Status: DC
  Administered 2017-12-27: 10 mg via ORAL
  Filled 2017-12-27: qty 1

## 2017-12-27 NOTE — Patient Outreach (Signed)
ED Peer Support Specialist Patient Intake (Complete at intake & 30-60 Day Follow-up)  Name: Tim [name]  MRN: [id number]  Age: 62 y.o.   Date of Admission: 12/27/2017  Intake: Initial Comments:      Primary Reason Admitted: SI, poly substance use with alcohol and marijuana  Lab values: Alcohol/ETOH: Positive Positive UDS? Yes Amphetamines: No Barbiturates: No Benzodiazepines: No Cocaine: No Opiates: No Cannabinoids: Yes  Demographic information: Gender: Male Ethnicity: White Marital Status: Single Insurance Status: Uninsured/Self-pay Ecologist (Work Neurosurgeon, Physicist, medical, etc.: Yes(Foods stamps) Lives with: Parent(Mother) Living situation: House/Apartment  Reported Patient History: Patient reported health conditions: Other (comment) Patient aware of HIV and hepatitis status: Yes (comment)(Hepatitis C)  In past year, has patient visited ED for any reason? No  Number of ED visits: 1  Reason(s) for visit: substance use, SI attempt with a drug overdose on Gabapentin, Prozac, and Reisperdal   In past year, has patient been hospitalized for any reason? Yes  Number of hospitalizations: 1  Reason(s) for hospitalization: substance use with alcohol and marijuana, SI attempt with drug overdose on Gabapentin, Prozac, and Reisperdal   In past year, has patient been arrested? No  Number of arrests:    Reason(s) for arrest:    In past year, has patient been incarcerated? No  Number of incarcerations:    Reason(s) for incarceration:    In past year, has patient received medication-assisted treatment? No  In past year, patient received the following treatments:    In past year, has patient received any harm reduction services? No  Did this include any of the following?    In past year, has patient received care from a mental health provider for diagnosis other than SUD? Yes(Depression/Anxiety with medication assitence at  [facility])  In past year, is this first time patient has overdosed? Yes(Patient reports that he has not overdosed on opioids or other Narcotics. SI attempt with drug overdose on Gabapentin, Prozac, and Reisperdal. back in November 2018.)  Number of past overdoses:    In past year, is this first time patient has been hospitalized for an overdose? Yes  Number of hospitalizations for overdose(s):    Is patient currently receiving treatment for a mental health diagnosis? Yes([facility] for anxiety, depression)  Patient reports experiencing difficulty participating in SUD treatment: No    Most important reason(s) for this difficulty?    Has patient received prior services for treatment? Yes([facility] in November 2018, [facility] and [facility] sometime last year)  In past, patient has received services from following agencies: Other (comment)  Plan of Care:  Suggested follow up at these agencies/treatment centers: Other (comment)  Other information: [facility]PSS met with the patient and provided substance use recovery support. Patient plans to talk about his substance use treatment options with his family and try to get into residential treatment. Patient has family support that can help him gain access to substance use treatment. CPSS will provide a list of residential treatment centers, outpatient treatment centers, [facility] meeting list, and CPSS contact information. CPSS will encourage the patient to contact CPSS at anytime for substance use recovery support or help with resources.    Mason Jim, CPSS  12/27/2017 1:22 PM

## 2017-12-27 NOTE — ED Notes (Signed)
Patient has 2 belongings bag.

## 2017-12-27 NOTE — ED Notes (Signed)
Pt discharged home. Discharged instructions read to pt who verbalized understanding. All belongings returned to pt who signed for same. Denies SI/HI, is not delusional and not responding to internal stimuli. Escorted pt to the ED exit.    

## 2017-12-27 NOTE — [facility] Suicide Risk Assessment (Cosign Needed)
Suicide Risk Assessment  Discharge Assessment   [name] Discharge Suicide Risk Assessment   Principal Problem: Severe episode of recurrent major depressive disorder, without psychotic features [name]) Discharge Diagnoses:  Patient Active Problem List   Diagnosis Date Noted  . Severe recurrent major depression without psychotic features (HCC) [F33.2] 08/20/2017  . Severe episode of recurrent major depressive disorder, without psychotic features (HCC) [F33.2] 07/08/2016  . MDD (major depressive disorder), recurrent severe, without psychosis (HCC) [F33.2] 05/23/2016  . Hepatitis C [B19.20] 05/23/2016  . Alcohol use disorder, severe, dependence (HCC) [F10.20] 11/05/2015  . 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 GAD (generalized anxiety disorder) [F41.1] 11/05/2015  . Cellulitis of right lower extremity [L03.115] 03/29/2015   Pt was seen and chart reviewed with treatment team and Dr Sharma Covert. Pt denies suicidal/homicidal ideation, denies auditory/visual hallucinations and does not appear to be responding to internal stimuli. Pt stated he has been drinking most of his life and the longest period of sobriety he has had is one week. Pt stated he went to rehab for alcohol abuse one year ago. Pt wants help with his alcohol abuse so he can attempt to get his life straightened out. Pt has a history of self-injurious behaviors in which he burns his arms with cigarettes. There are at least ten such burns on both forearms to which antibiotic ointment is being applied. Pt was seen by peer support for assistance with outpatient resources for substance abuse treatment programs. Pt lives with his mother and stated he feels safe to return home and pursue treatment on an outpatient basis. Pt is psychiatrically clear for discharge.   Total Time spent with patient: 30 minutes  Musculoskeletal: Strength & Muscle Tone: within normal limits Gait & Station: normal Patient leans: N/A  Psychiatric  Specialty Exam:   Blood pressure 108/71, pulse 87, temperature 98.2 F (36.8 C), temperature source Oral, resp. rate 16, height 6\' 2"  (1.88 m), weight 71.7 kg (158 lb 1.6 oz), SpO2 96 %.Body mass index is 20.3 kg/m.  General Appearance: Casual  Eye Contact::  Fair  Speech:  [phone]  Volume:  Normal  Mood:  Anxious and Depressed  Affect:  Congruent and Depressed  Thought Process:  Coherent and Linear  Orientation:  Full (Time, Place, and Person)  Thought Content:  Logical  Suicidal Thoughts:  No  Homicidal Thoughts:  No  Memory:  Immediate;   Good Recent;   Good Remote;   Fair  Judgement:  Fair  Insight:  Fair  Psychomotor Activity:  Normal  Concentration:  Fair  Recall:  Good  Fund of Knowledge:Good  Language: Good  Akathisia:  No  Handed:  Right  AIMS (if indicated):     Assets:  Communication Skills Desire for Improvement Financial Resources/Insurance Housing  Sleep:     Cognition: WNL  ADL's:  Intact   Mental Status Per Nursing Assessment::   On Admission:   Suicidal ideation while intoxicated  Demographic Factors:  Male, Caucasian, Low socioeconomic status and Unemployed  Loss Factors: Financial problems/change in socioeconomic status  Historical Factors: Impulsivity  Risk Reduction Factors:   Sense of responsibility to family and Living with another person, especially a relative  Continued Clinical Symptoms:  Severe Anxiety and/or Agitation Depression:   Impulsivity Alcohol/Substance Abuse/Dependencies More than one psychiatric diagnosis Previous Psychiatric Diagnoses and Treatments Medical Diagnoses and Treatments/Surgeries  Cognitive Features That Contribute To Risk:  Closed-mindedness    Suicide Risk:  Minimal: No identifiable suicidal ideation.  Patients presenting with no risk  factors but with morbid ruminations; may be classified as minimal risk based on the severity of the depressive symptoms    Plan Of Care/Follow-up recommendations:   Activity:  as tolerated Diet:  Heart Healthy  Laveda AbbeLaurie Britton Nickayla Mcinnis, NP 12/27/2017, 12:36 PM

## 2017-12-27 NOTE — Discharge Instructions (Signed)
For your mental health needs, you are advised to continue treatment with [facility]: ° °     [facility] °     [address] °     [place], [place] [address] °     [phone]

## 2017-12-27 NOTE — ED Notes (Signed)
TTS in progress 
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
## 2017-12-27 NOTE — ED Notes (Signed)
MD stated patient can go to TCU.

## 2017-12-27 NOTE — ED Notes (Signed)
Report given to Latricia London, RN °
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
## 2017-12-27 NOTE — BH Assessment (Signed)
[facility] Assessment Progress Note  Per Jacqueline Norman, DO, this pt does not require psychiatric hospitalization at this time.  Pt is to be discharged from [facility] with recommendation to continue treatment with [facility].  This has been included in pt's discharge instructions.  Pt would also benefit from seeing Peer Support Specialists; they will be asked to speak to pt.  Pt's nurse, Ashley, has been notified.  Braelin Costlow, MA Triage Specialist [phone]     
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
## 2018-01-28 ENCOUNTER — Encounter (HOSPITAL_COMMUNITY): Payer: Self-pay | Admitting: Emergency Medicine

## 2018-01-28 ENCOUNTER — Emergency Department (HOSPITAL_COMMUNITY)
Admission: EM | Admit: 2018-01-28 | Discharge: 2018-01-29 | Disposition: A | Discharge status: Home / Self Care | Payer: Self-pay | Attending: Emergency Medicine | Admitting: Emergency Medicine

## 2018-01-28 ENCOUNTER — Other Ambulatory Visit: Payer: Self-pay

## 2018-01-28 DIAGNOSIS — F329 Major depressive disorder, single episode, unspecified: Secondary | ICD-10-CM | POA: Insufficient documentation

## 2018-01-28 DIAGNOSIS — F419 Anxiety disorder, unspecified: Secondary | ICD-10-CM | POA: Insufficient documentation

## 2018-01-28 DIAGNOSIS — F1420 Cocaine dependence, uncomplicated: Secondary | ICD-10-CM | POA: Insufficient documentation

## 2018-01-28 DIAGNOSIS — Z79899 Other long term (current) drug therapy: Secondary | ICD-10-CM | POA: Insufficient documentation

## 2018-01-28 DIAGNOSIS — F1920 Other psychoactive substance dependence, uncomplicated: Secondary | ICD-10-CM

## 2018-01-28 DIAGNOSIS — F1414 Cocaine abuse with cocaine-induced mood disorder: Secondary | ICD-10-CM

## 2018-01-28 DIAGNOSIS — J449 Chronic obstructive pulmonary disease, unspecified: Secondary | ICD-10-CM | POA: Insufficient documentation

## 2018-01-28 DIAGNOSIS — Z85828 Personal history of other malignant neoplasm of skin: Secondary | ICD-10-CM | POA: Insufficient documentation

## 2018-01-28 DIAGNOSIS — F1020 Alcohol dependence, uncomplicated: Secondary | ICD-10-CM | POA: Insufficient documentation

## 2018-01-28 DIAGNOSIS — R45851 Suicidal ideations: Secondary | ICD-10-CM | POA: Insufficient documentation

## 2018-01-28 DIAGNOSIS — F17210 Nicotine dependence, cigarettes, uncomplicated: Secondary | ICD-10-CM | POA: Insufficient documentation

## 2018-01-28 DIAGNOSIS — F1910 Other psychoactive substance abuse, uncomplicated: Secondary | ICD-10-CM

## 2018-01-28 LAB — COMPREHENSIVE METABOLIC PANEL
ALT: 23 U/L (ref 17–63)
AST: 30 U/L (ref 15–41)
Albumin: 4.3 g/dL (ref 3.5–5.0)
Alkaline Phosphatase: 67 U/L (ref 38–126)
Anion gap: 10 (ref 5–15)
BUN: 13 mg/dL (ref 6–20)
CHLORIDE: 105 mmol/L (ref 101–111)
CO2: 25 mmol/L (ref 22–32)
CREATININE: 1 mg/dL (ref 0.61–1.24)
Calcium: 10.3 mg/dL (ref 8.9–10.3)
Glucose, Bld: 88 mg/dL (ref 65–99)
POTASSIUM: 3.9 mmol/L (ref 3.5–5.1)
Sodium: 140 mmol/L (ref 135–145)
TOTAL PROTEIN: 7.5 g/dL (ref 6.5–8.1)
Total Bilirubin: 0.8 mg/dL (ref 0.3–1.2)

## 2018-01-28 LAB — CBC
HCT: 47.9 % (ref 39.0–52.0)
HEMOGLOBIN: 16 g/dL (ref 13.0–17.0)
MCH: 30.7 pg (ref 26.0–34.0)
MCHC: 33.4 g/dL (ref 30.0–36.0)
MCV: 91.9 fL (ref 78.0–100.0)
Platelets: 214 10*3/uL (ref 150–400)
RBC: 5.21 MIL/uL (ref 4.22–5.81)
RDW: 13.9 % (ref 11.5–15.5)
WBC: 5.8 10*3/uL (ref 4.0–10.5)

## 2018-01-28 MED ORDER — LORAZEPAM 1 MG PO TABS: 0.0000 mg | ORAL_TABLET | Freq: Two times a day (BID) | ORAL | Status: DC

## 2018-01-28 MED ORDER — VITAMIN B-1 100 MG PO TABS
100.0000 mg | ORAL_TABLET | Freq: Every day | ORAL | Status: DC
  Administered 2018-01-29: 100 mg via ORAL
  Filled 2018-01-28: qty 1

## 2018-01-28 MED ORDER — LORAZEPAM 2 MG/ML IJ SOLN: 0.0000 mg | Freq: Two times a day (BID) | INTRAMUSCULAR | Status: DC

## 2018-01-28 MED ORDER — LORAZEPAM 2 MG/ML IJ SOLN: 0.0000 mg | Freq: Four times a day (QID) | INTRAMUSCULAR | Status: DC

## 2018-01-28 MED ORDER — LORAZEPAM 1 MG PO TABS: 0.0000 mg | ORAL_TABLET | Freq: Four times a day (QID) | ORAL | Status: DC

## 2018-01-28 MED ORDER — ACETAMINOPHEN 325 MG PO TABS
650.0000 mg | ORAL_TABLET | ORAL | Status: DC | PRN
  Administered 2018-01-29: 650 mg via ORAL
  Filled 2018-01-28: qty 2

## 2018-01-28 MED ORDER — ONDANSETRON HCL 4 MG PO TABS: 4.0000 mg | ORAL_TABLET | Freq: Three times a day (TID) | ORAL | Status: DC | PRN

## 2018-01-28 MED ORDER — THIAMINE HCL 100 MG/ML IJ SOLN: 100.0000 mg | Freq: Every day | INTRAMUSCULAR | Status: DC

## 2018-01-28 NOTE — ED Notes (Signed)
Bed: [id number] Expected date:  Expected time:  Means of arrival:  Comments: t4
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
## 2018-01-28 NOTE — ED Triage Notes (Signed)
Pt reports having been on a alcohol binge the last several days and now reporting suicidal ideation without a plan.

## 2018-01-28 NOTE — ED Notes (Signed)
Bed: [facility] Expected date:  Expected time:  Means of arrival:  Comments: 

## 2018-01-28 NOTE — ED Notes (Signed)
Pt called for triage, no response from lobby 

## 2018-01-29 DIAGNOSIS — F1920 Other psychoactive substance dependence, uncomplicated: Secondary | ICD-10-CM

## 2018-01-29 DIAGNOSIS — F1414 Cocaine abuse with cocaine-induced mood disorder: Secondary | ICD-10-CM

## 2018-01-29 LAB — RAPID URINE DRUG SCREEN, HOSP PERFORMED
AMPHETAMINES: NOT DETECTED
BENZODIAZEPINES: NOT DETECTED
Barbiturates: NOT DETECTED
COCAINE: POSITIVE — AB
Opiates: NOT DETECTED
Tetrahydrocannabinol: POSITIVE — AB

## 2018-01-29 LAB — ETHANOL: Alcohol, Ethyl (B): 79 mg/dL — ABNORMAL HIGH (ref <10)

## 2018-01-29 LAB — SALICYLATE LEVEL: Salicylate Lvl: <7 mg/dL (ref 2.8–30.0)

## 2018-01-29 LAB — ACETAMINOPHEN LEVEL

## 2018-01-29 MED ORDER — GABAPENTIN 300 MG PO CAPS
300.0000 mg | ORAL_CAPSULE | Freq: Two times a day (BID) | ORAL | Status: DC
  Administered 2018-01-29: 300 mg via ORAL
  Filled 2018-01-29: qty 1
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
## 2018-01-29 NOTE — Discharge Instructions (Signed)
For your mental health needs, you are advised to follow up with [facility].  New and returning patients are seen at their walk-in clinic.  Walk-in hours are Monday - Friday from 8:00 am - 3:00 pm.  Walk-in patients are seen on a first come, first served basis.  Try to arrive as early as possible for he best chance of being seen the same day: ° °     [facility] °     [address] °     [place], [place] [address] °     [phone] °

## 2018-01-29 NOTE — ED Notes (Signed)
SBAR Report received from previous nurse. Pt received calm and visible on unit. Pt denies current HI, A/V H but endorses  depression, anxiety, and pain all over 5/10  at this time, and appears otherwise stable and free of distress. Pt reminded of camera surveillance, q 15 min rounds, and rules of the milieu. Will continue to assess.

## 2018-01-29 NOTE — ED Notes (Signed)
Pt d/c home per MD order . Discharge summary reviewed with pt, pt verbalizes understanding. Pt denies SI/HI/AVH. Personal property returned to pt. Pt signed e-signature. Pt ambulatory off unit with MHT.

## 2018-01-29 NOTE — [facility] Suicide Risk Assessment (Cosign Needed)
Suicide Risk Assessment  Discharge Assessment   [facility] Discharge Suicide Risk Assessment   Principal ProblemSavoy Medical [name]: Cocaine abuse with cocaine-induced mood disorder Tim [name](HCC) Discharge Diagnoses:  Patient Active Problem List   Diagnosis Date Noted  . Cocaine abuse with cocaine-induced mood disorder (HCC) [F14.14] 01/29/2018  . Polysubstance dependence (HCC) [F19.20] 01/29/2018  . Severe recurrent major depression without psychotic features (HCC) [F33.2] 08/20/2017  . Severe episode of recurrent major depressive disorder, without psychotic features (HCC) [F33.2] 07/08/2016  . MDD (major depressive disorder), recurrent severe, without psychosis (HCC) [F33.2] 05/23/2016  . Hepatitis C [B19.20] 05/23/2016  . Alcohol use disorder, severe, dependence (HCC) [F10.20] 11/05/2015  . 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 GAD (generalized anxiety disorder) [F41.1] 11/05/2015  . Cellulitis of right lower extremity [L03.115] 03/29/2015   Pt was seen and chart reviewed with treatment team and Dr Jannifer FranklinAkintayo.  Pt denies suicidal/homicidal ideation, denies auditory/visual hallucinations and does not appear to be responding to internal stimuli. Pt is well known to this emergency room and frequently presents with alcohol and illicit drugs on board. Pt's UDS positive cocaine and THC, BAL 79. Pt wants to go to rehab and get help for his drug and alcohol use. Pt will be seen by Peer Support for substance abuse resources in the community. Pt is psychiatrically clear for discharge.   Total Time spent with patient: 30 minutes  Musculoskeletal: Strength & Muscle Tone: within normal limits Gait & Station: normal Patient leans: N/A  Psychiatric Specialty Exam:   Blood pressure 116/85, pulse 90, temperature 98.6 F (37 C), temperature source Oral, resp. rate 18, height 6\' 2"  (1.88 m), weight 77.1 kg (170 lb), SpO2 97 %.Body mass index is 21.83 kg/m.  General Appearance: Casual  Eye Contact::  Fair   Speech:  Clear and Coherent409  Volume:  Normal  Mood:  Euthymic  Affect:  Congruent  Thought Process:  Coherent  Orientation:  Full (Time, Place, and Person)  Thought Content:  Logical  Suicidal Thoughts:  No  Homicidal Thoughts:  No  Memory:  Immediate;   Good Recent;   Fair Remote;   Fair  Judgement:  Poor  Insight:  Lacking  Psychomotor Activity:  Normal  Concentration:  Good  Recall:  Good  Fund of Knowledge:Good  Language: Good  Akathisia:  No  Handed:  Right  AIMS (if indicated):     Assets:  ArchitectCommunication Skills Financial Resources/Insurance Housing  Sleep:     Cognition: WNL  ADL's:  Intact   Mental Status Per Nursing Assessment::   On Admission:     Demographic Factors:  Male, Caucasian, Low socioeconomic status and Unemployed  Loss Factors: Financial problems/change in socioeconomic status  Historical Factors: Impulsivity  Risk Reduction Factors:   Sense of responsibility to family and Living with another person, especially a relative  Continued Clinical Symptoms:  Severe Anxiety and/or Agitation Depression:   Comorbid alcohol abuse/dependence Impulsivity Alcohol/Substance Abuse/Dependencies More than one psychiatric diagnosis Previous Psychiatric Diagnoses and Treatments  Cognitive Features That Contribute To Risk:  Closed-mindedness    Suicide Risk:  Minimal: No identifiable suicidal ideation.  Patients presenting with no risk factors but with morbid ruminations; may be classified as minimal risk based on the severity of the depressive symptoms    Plan Of Care/Follow-up recommendations:  Activity:  as tolerated Diet:  Heart Healthy  Laveda AbbeLaurie Britton Parks, NP 01/29/2018, 11:44 AM

## 2018-01-29 NOTE — BH Assessment (Signed)
[facility] Assessment Progress Note  Per Mojeed Akintayo, MD, this pt does not require psychiatric hospitalization at this time.  [facility] from [facility] with recommendation to follow up with [facility].  This has been included in pt's discharge instructions.  Pt would also benefit from seeing Peer Support Specialists; they will be asked to speak to pt.  Pt's nurse, Ashley, has been notified.  Tim Puebla, MA Triage Specialist [phone]      

## 2018-01-29 NOTE — ED Notes (Signed)
Pt taking a shower 
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
## 2018-01-29 NOTE — Progress Notes (Signed)
Pt meets criteria for inpt treatment per Nira ConnJason Berry, NP. EDP Luevenia MaxinFawze, Marlynn PerkingMina A, PA-C and pt's nurse Darel HongJudy, RN have been advised of the disposition. TTS to seek placement.  Princess BruinsAquicha Makeda Peeks, MSW, LCSW Therapeutic Triage Specialist  [phone]

## 2018-07-19 ENCOUNTER — Other Ambulatory Visit: Payer: Self-pay

## 2018-07-19 ENCOUNTER — Encounter (HOSPITAL_COMMUNITY): Payer: Self-pay | Admitting: *Deleted

## 2018-07-19 DIAGNOSIS — F17210 Nicotine dependence, cigarettes, uncomplicated: Secondary | ICD-10-CM | POA: Diagnosis not present

## 2018-07-19 DIAGNOSIS — F1490 Cocaine use, unspecified, uncomplicated: Secondary | ICD-10-CM | POA: Diagnosis not present

## 2018-07-19 DIAGNOSIS — K4090 Unilateral inguinal hernia, without obstruction or gangrene, not specified as recurrent: Secondary | ICD-10-CM | POA: Diagnosis not present

## 2018-07-19 DIAGNOSIS — E079 Disorder of thyroid, unspecified: Secondary | ICD-10-CM | POA: Insufficient documentation

## 2018-07-19 DIAGNOSIS — F1029 Alcohol dependence with unspecified alcohol-induced disorder: Secondary | ICD-10-CM | POA: Diagnosis not present

## 2018-07-19 DIAGNOSIS — F1290 Cannabis use, unspecified, uncomplicated: Secondary | ICD-10-CM | POA: Insufficient documentation

## 2018-07-19 DIAGNOSIS — Z79899 Other long term (current) drug therapy: Secondary | ICD-10-CM | POA: Diagnosis not present

## 2018-07-19 DIAGNOSIS — J449 Chronic obstructive pulmonary disease, unspecified: Secondary | ICD-10-CM | POA: Insufficient documentation

## 2018-07-19 LAB — URINALYSIS, ROUTINE W REFLEX MICROSCOPIC
Bilirubin Urine: NEGATIVE
GLUCOSE, UA: NEGATIVE mg/dL
HGB URINE DIPSTICK: NEGATIVE
Ketones, ur: NEGATIVE mg/dL
Leukocytes, UA: NEGATIVE
Nitrite: NEGATIVE
PH: 5 (ref 5.0–8.0)
Protein, ur: NEGATIVE mg/dL
SPECIFIC GRAVITY, URINE: 1.016 (ref 1.005–1.030)

## 2018-07-19 NOTE — ED Triage Notes (Signed)
Pt arrives with c/o request for alcohol detox. Pt also concerned about a bulging area in the right groin, reports hx of hernia. Some difficulty urinating.

## 2018-07-20 ENCOUNTER — Emergency Department (HOSPITAL_COMMUNITY)
Admission: EM | Admit: 2018-07-20 | Discharge: 2018-07-20 | Disposition: A | Discharge status: Home / Self Care | Payer: Medicaid Other | Attending: Emergency Medicine | Admitting: Emergency Medicine

## 2018-07-20 DIAGNOSIS — K4090 Unilateral inguinal hernia, without obstruction or gangrene, not specified as recurrent: Secondary | ICD-10-CM

## 2018-07-20 DIAGNOSIS — F1029 Alcohol dependence with unspecified alcohol-induced disorder: Secondary | ICD-10-CM

## 2018-07-20 NOTE — Discharge Instructions (Addendum)
Follow-up with [facility] to discuss your options regarding your inguinal hernia.  Their contact information has been provided in this discharge summary for you to call and make these arrangements.  The resource guide for outpatient substance abuse treatment has been provided in this discharge summary as well.  Please call to make arrangements.
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
## 2018-09-14 ENCOUNTER — Encounter (HOSPITAL_COMMUNITY): Payer: Self-pay | Admitting: Emergency Medicine

## 2018-09-14 ENCOUNTER — Other Ambulatory Visit: Payer: Self-pay

## 2018-09-14 ENCOUNTER — Emergency Department (HOSPITAL_COMMUNITY)
Admission: EM | Admit: 2018-09-14 | Discharge: 2018-09-15 | Disposition: A | Discharge status: Home / Self Care | Payer: Medicaid Other | Attending: Emergency Medicine | Admitting: Emergency Medicine

## 2018-09-14 DIAGNOSIS — F329 Major depressive disorder, single episode, unspecified: Secondary | ICD-10-CM

## 2018-09-14 DIAGNOSIS — F419 Anxiety disorder, unspecified: Secondary | ICD-10-CM | POA: Diagnosis not present

## 2018-09-14 DIAGNOSIS — Z79899 Other long term (current) drug therapy: Secondary | ICD-10-CM | POA: Diagnosis not present

## 2018-09-14 DIAGNOSIS — F17210 Nicotine dependence, cigarettes, uncomplicated: Secondary | ICD-10-CM | POA: Diagnosis not present

## 2018-09-14 DIAGNOSIS — F332 Major depressive disorder, recurrent severe without psychotic features: Secondary | ICD-10-CM | POA: Insufficient documentation

## 2018-09-14 DIAGNOSIS — F1020 Alcohol dependence, uncomplicated: Secondary | ICD-10-CM | POA: Insufficient documentation

## 2018-09-14 DIAGNOSIS — F1410 Cocaine abuse, uncomplicated: Secondary | ICD-10-CM | POA: Insufficient documentation

## 2018-09-14 DIAGNOSIS — J449 Chronic obstructive pulmonary disease, unspecified: Secondary | ICD-10-CM | POA: Insufficient documentation

## 2018-09-14 DIAGNOSIS — F10220 Alcohol dependence with intoxication, uncomplicated: Secondary | ICD-10-CM | POA: Diagnosis present

## 2018-09-14 DIAGNOSIS — R45851 Suicidal ideations: Secondary | ICD-10-CM | POA: Insufficient documentation

## 2018-09-14 DIAGNOSIS — F32A Depression, unspecified: Secondary | ICD-10-CM

## 2018-09-14 DIAGNOSIS — Z008 Encounter for other general examination: Secondary | ICD-10-CM | POA: Diagnosis present

## 2018-09-14 LAB — COMPREHENSIVE METABOLIC PANEL
ALT: 15 U/L (ref 0–44)
AST: 18 U/L (ref 15–41)
Albumin: 4.1 g/dL (ref 3.5–5.0)
Alkaline Phosphatase: 61 U/L (ref 38–126)
Anion gap: 10 (ref 5–15)
BUN: 10 mg/dL (ref 6–20)
CO2: 23 mmol/L (ref 22–32)
Calcium: 9.5 mg/dL (ref 8.9–10.3)
Chloride: 105 mmol/L (ref 98–111)
Creatinine, Ser: 0.98 mg/dL (ref 0.61–1.24)
GFR calc non Af Amer: >60 mL/min (ref >60)
Glucose, Bld: 97 mg/dL (ref 70–99)
Potassium: 3.3 mmol/L — ABNORMAL LOW (ref 3.5–5.1)
Sodium: 138 mmol/L (ref 135–145)
Total Bilirubin: 0.6 mg/dL (ref 0.3–1.2)
Total Protein: 6.9 g/dL (ref 6.5–8.1)

## 2018-09-14 LAB — CBC
HCT: 44.6 % (ref 39.0–52.0)
Hemoglobin: 14.6 g/dL (ref 13.0–17.0)
MCH: 30.7 pg (ref 26.0–34.0)
MCHC: 32.7 g/dL (ref 30.0–36.0)
MCV: 93.9 fL (ref 80.0–100.0)
Platelets: 262 10*3/uL (ref 150–400)
RBC: 4.75 MIL/uL (ref 4.22–5.81)
RDW: 13.4 % (ref 11.5–15.5)
WBC: 6.4 10*3/uL (ref 4.0–10.5)
nRBC: 0 % (ref 0.0–0.2)

## 2018-09-14 LAB — ACETAMINOPHEN LEVEL: Acetaminophen (Tylenol), Serum: <10 ug/mL — ABNORMAL LOW (ref 10–30)

## 2018-09-14 LAB — SALICYLATE LEVEL

## 2018-09-14 LAB — ETHANOL: Alcohol, Ethyl (B): 250 mg/dL — ABNORMAL HIGH (ref <10)
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
## 2018-09-14 NOTE — ED Notes (Signed)
While collecting pt belongings this writer noted 8 unopened beer cans in a 12 pack box. Pt belongings labeled with patient sticker and brought up to nurses station.

## 2018-09-14 NOTE — ED Notes (Signed)
Bed: WA29 Expected date:  Expected time:  Means of arrival:  Comments: Tr 4

## 2018-09-15 DIAGNOSIS — F1020 Alcohol dependence, uncomplicated: Secondary | ICD-10-CM

## 2018-09-15 LAB — RAPID URINE DRUG SCREEN, HOSP PERFORMED
AMPHETAMINES: NOT DETECTED
Barbiturates: NOT DETECTED
Benzodiazepines: NOT DETECTED
Cocaine: POSITIVE — AB
Opiates: NOT DETECTED
Tetrahydrocannabinol: POSITIVE — AB

## 2018-09-15 MED ORDER — GABAPENTIN 300 MG PO CAPS: 300.0000 mg | ORAL_CAPSULE | Freq: Three times a day (TID) | ORAL | 0 refills | Status: DC

## 2018-09-15 MED ORDER — SODIUM CHLORIDE 0.9 % IV BOLUS
1000.0000 mL | Freq: Once | INTRAVENOUS | Status: AC
  Administered 2018-09-15: 1000 mL via INTRAVENOUS

## 2018-09-15 MED ORDER — GABAPENTIN 300 MG PO CAPS
300.0000 mg | ORAL_CAPSULE | Freq: Three times a day (TID) | ORAL | Status: DC
  Administered 2018-09-15: 300 mg via ORAL
  Filled 2018-09-15: qty 1

## 2018-09-15 NOTE — ED Notes (Signed)
Pt alert and oriented. Pt denies any pain , hi, and avh at this time. Pt states " I want to kill myself" pt has no plan. Pt contracts to safety. Pt stable, will continue to monitor.

## 2018-09-15 NOTE — Consult Note (Addendum)
[facility] ED Discharge  09/15/2018 10:43 AM Tim [name]  MRN:  [id number] Principal Problem: Alcohol use disorder, severe, dependence [facility]) Discharge Diagnoses: Principal Problem:   Alcohol use disorder, severe, dependence (HCC)  Subjective: 62 yo male who presented to the ED with alcohol intoxication and passive suicidal ideations.  He is calm, cooperative, and coherent today.  Reports his alcohol increases with depression and improves with medications.  No depression today, mild anxiety.  Pleasant on assessment with no suicidal/homicidal ideations, hallucinations, or withdrawal symptoms.  He is agreeable to meeting with peer support for resources for outpatient care.  Total Time spent with patient: 30 minutes  Past Psychiatric History: substance abuse, depression, anxiety  Past Medical History:  Past Medical History:  Diagnosis Date  . Alcoholism (HCC)   . Anxiety   . COPD (chronic obstructive pulmonary disease) (HCC)   . Depression   . Hep C w/o coma, chronic (HCC)   . History of hiatal hernia    hernia rt groin  . Mental health disorder   . Scoliosis   . Thyroid disease    Past Surgical History:  Procedure Laterality Date  . SKIN CANCER EXCISION    . THYROID SURGERY     Family History:  Family History  Problem Relation Age of Onset  . Dementia Mother    Family Psychiatric  History: mother with dementia Social History:  Social History   Substance and Sexual Activity  Alcohol Use Yes   Comment: drank 1/5 today.    Social History   Substance and Sexual Activity  Drug Use Yes  . Types: Cocaine, Marijuana   Comment: also states will take pain pills every now and then, last use of cocaine and ETOH today at 1400   Social History   Socioeconomic History  . Marital status: Single    Spouse name: Not on file  . Number of children: Not on file  . Years of education: Not on file  . Highest education level: Not on file  Occupational History  . Not on file   Social Needs  . Financial resource strain: Not on file  . Food insecurity:    Worry: Not on file    Inability: Not on file  . Transportation needs:    Medical: Not on file    Non-medical: Not on file  Tobacco Use  . Smoking status: Current Every Day Smoker    Packs/day: 1.00    Years: 40.00    Pack years: 40.00    Types: Cigarettes  . Smokeless tobacco: Never Used  Substance and Sexual Activity  . Alcohol use: Yes    Comment: drank 1/5 today.  . Drug use: Yes    Types: Cocaine, Marijuana    Comment: also states will take pain pills every now and then, last use of cocaine and ETOH today at 1400  . Sexual activity: Never    Birth control/protection: None  Lifestyle  . Physical activity:    Days per week: Not on file    Minutes per session: Not on file  . Stress: Not on file  Relationships  . Social connections:    Talks on phone: Not on file    Gets together: Not on file    Attends religious service: Not on file    Active member of club or organization: Not on file    Attends meetings of clubs or organizations: Not on file    Relationship status: Not on file  Other Topics Concern  .  Not on file  Social History Narrative   ** Merged History Encounter **        Has this patient used any form of tobacco in the last 30 days? (Cigarettes, Smokeless Tobacco, Cigars, and/or Pipes) A prescription for an FDA-approved tobacco cessation medication was offered at discharge and the patient refused  Current Medications: Current Facility-Administered Medications  Medication Dose Route Frequency Provider Last Rate Last Dose  . gabapentin (NEURONTIN) capsule 300 mg  300 mg Oral TID Charm Rings, NP       Current Outpatient Medications  Medication Sig Dispense Refill  . escitalopram (LEXAPRO) 10 MG tablet Take 1 tablet (10 mg total) by mouth daily. 30 tablet 0  . hydrOXYzine (ATARAX/VISTARIL) 25 MG tablet Take 25 mg by mouth every 6 (six) hours as needed for anxiety.    .  risperiDONE (RISPERDAL) 0.25 MG tablet Take 0.25 mg by mouth 2 (two) times daily.    . traZODone (DESYREL) 100 MG tablet Take 1 tablet (100 mg total) at bedtime by mouth. For sleep 30 tablet 0   PTA Medications:  (Not in a hospital admission)  Musculoskeletal: Strength & Muscle Tone: within normal limits Gait & Station: normal Patient leans: N/A  Psychiatric Specialty Exam: Physical Exam  Nursing note and vitals reviewed. Constitutional: He is oriented to person, place, and time. He appears well-developed and well-nourished.  HENT:  Head: Normocephalic.  Neck: Normal range of motion.  Respiratory: Effort normal.  Musculoskeletal: Normal range of motion.  Neurological: He is alert and oriented to person, place, and time.  Psychiatric: His speech is normal and behavior is normal. Judgment and thought content normal. His mood appears anxious. Cognition and memory are normal.    Review of Systems  Psychiatric/Behavioral: Positive for substance abuse. The patient is nervous/anxious.   All other systems reviewed and are negative.   Blood pressure 118/73, pulse 82, temperature 98.2 F (36.8 C), temperature source Oral, resp. rate 20, SpO2 94 %.There is no height or weight on file to calculate BMI.  General Appearance: Casual  Eye Contact:  Good  Speech:  Normal Rate  Volume:  Normal  Mood:  Anxious, mild  Affect:  Congruent  Thought Process:  Coherent and Descriptions of Associations: Intact  Orientation:  Full (Time, Place, and Person)  Thought Content:  WDL and Logical  Suicidal Thoughts:  No  Homicidal Thoughts:  No  Memory:  Immediate;   Good Recent;   Good Remote;   Good  Judgement:  Fair  Insight:  Fair  Psychomotor Activity:  Normal  Concentration:  Concentration: Good and Attention Span: Good  Recall:  Good  Fund of Knowledge:  Good  Language:  Good  Akathisia:  No  Handed:  Right  AIMS (if indicated):     Assets:  Leisure Time Physical  Health Resilience Social Support  ADL's:  Intact  Cognition:  WNL  Sleep:      Demographic Factors:  Male and Caucasian  Loss Factors: NA  Historical Factors: NA  Risk Reduction Factors:   Sense of responsibility to family, Positive social support and Positive therapeutic relationship  Continued Clinical Symptoms:  Anxiety, mild  Cognitive Features That Contribute To Risk:  None    Suicide Risk:  Minimal: No identifiable suicidal ideation.  Patients presenting with no risk factors but with morbid ruminations; may be classified as minimal risk based on the severity of the depressive symptoms   Plan Of Care/Follow-up recommendations:  Alcohol abuse with alcohol induced mood  disorder: -Gabapentin 300 mg TID for alcohol withdrawal   Activity:  as tolerated Diet:  heart healthy diet  Disposition: discharge home   Nanine MeansLORD, JAMISON, NP 09/15/2018, 10:43 AM  Patient seen face-to-face for psychiatric evaluation, chart reviewed and case discussed with the physician extender and developed treatment plan. Reviewed the information documented and agree with the treatment plan. Thedore MinsMojeed Shadman Tozzi, MD

## 2018-09-15 NOTE — ED Provider Notes (Signed)
[facility]-EMERGENCY DEPT Provider Note   CSN: [id number] Arrival date & time: 09/14/18  2102     History   Chief Complaint Chief Complaint  Patient presents with  . Suicidal    HPI Tim [name] is a 62 y.o. male.  Patient with past medical history remarkable for alcoholism, presents to the emergency department with a chief complaint of depression and suicidal ideation.  He states that he has been feeling very depressed over the holiday weekend.  States that he feels suicidal, but does not endorse a specific plan.  He states that he has been drinking heavily, but denies any drug use.  Denies any auditory or visual hallucinations.  Denies being in any pain.  Denies any other associated symptoms.  The history is provided by the patient. No language interpreter was used.    Past Medical History:  Diagnosis Date  . Alcoholism (HCC)   . Anxiety   . COPD (chronic obstructive pulmonary disease) (HCC)   . Depression   . Hep C w/o coma, chronic (HCC)   . History of hiatal hernia    hernia rt groin  . Mental health disorder   . Scoliosis   . Thyroid disease     Patient Active Problem List   Diagnosis Date Noted  . Cocaine abuse with cocaine-induced mood disorder (HCC) 01/29/2018  . Polysubstance dependence (HCC) 01/29/2018  . Severe recurrent major depression without psychotic features (HCC) 08/20/2017  . Severe episode of recurrent major depressive disorder, without psychotic features (HCC) 07/08/2016  . MDD (major depressive disorder), recurrent severe, without psychosis (HCC) 05/23/2016  . Hepatitis C 05/23/2016  . Alcohol use disorder, severe, dependence (HCC) 11/05/2015  . Depression, major, recurrent, moderate (HCC) 11/05/2015  . GAD (generalized anxiety disorder) 11/05/2015  . Cellulitis of right lower extremity 03/29/2015    Past Surgical History:  Procedure Laterality Date  . SKIN CANCER EXCISION    . THYROID SURGERY          Home  Medications    Prior to Admission medications   Medication Sig Start Date End Date Taking? Authorizing Provider  escitalopram (LEXAPRO) 10 MG tablet Take 1 tablet (10 mg total) by mouth daily. 12/28/17   Laveda Abbe, NP  gabapentin (NEURONTIN) 100 MG capsule Take 100 mg by mouth 3 (three) times daily.    [provider]  hydrOXYzine (ATARAX/VISTARIL) 25 MG tablet Take 25 mg by mouth every 6 (six) hours as needed for anxiety.    [provider]  traZODone (DESYREL) 100 MG tablet Take 1 tablet (100 mg total) at bedtime by mouth. For sleep 08/24/17   Money, Gerlene Burdock, FNP    Family History Family History  Problem Relation Age of Onset  . Dementia Mother     Social History Social History   Tobacco Use  . Smoking status: Current Every Day Smoker    Packs/day: 1.00    Years: 40.00    Pack years: 40.00    Types: Cigarettes  . Smokeless tobacco: Never Used  Substance Use Topics  . Alcohol use: Yes    Comment: drank 1/5 today.  . Drug use: Yes    Types: Cocaine, Marijuana    Comment: also states will take pain pills every now and then, last use of cocaine and ETOH today at 1400     Allergies   Patient has no known allergies.   Review of Systems Review of Systems  All other systems reviewed and are negative.  Physical Exam Updated Vital Signs BP (!) 88/64 (BP Location: Left Arm)   Pulse 62   Temp 97.6 F (36.4 C) (Oral)   Resp 16   SpO2 97%   Physical Exam  Constitutional: He is oriented to person, place, and time. He appears well-developed and well-nourished.  HENT:  Head: Normocephalic and atraumatic.  Eyes: Pupils are equal, round, and reactive to light. Conjunctivae and EOM are normal. Right eye exhibits no discharge. Left eye exhibits no discharge. No scleral icterus.  Neck: Normal range of motion. Neck supple. No JVD present.  Cardiovascular: Normal rate, regular rhythm and normal heart sounds. Exam reveals no gallop and no friction  rub.  No murmur heard. Pulmonary/Chest: Effort normal and breath sounds normal. No respiratory distress. He has no wheezes. He has no rales. He exhibits no tenderness.  Abdominal: Soft. He exhibits no distension and no mass. There is no tenderness. There is no rebound and no guarding.  Musculoskeletal: Normal range of motion. He exhibits no edema or tenderness.  Neurological: He is alert and oriented to person, place, and time.  Skin: Skin is warm and dry.  Psychiatric: He has a normal mood and affect. His behavior is normal. Judgment and thought content normal.  Nursing note and vitals reviewed.    ED Treatments / Results  Labs (all labs ordered are listed, but only abnormal results are displayed) Labs Reviewed  COMPREHENSIVE METABOLIC PANEL - Abnormal; Notable for the following components:      Result Value   Potassium 3.3 (*)    All other components within normal limits  ETHANOL - Abnormal; Notable for the following components:   Alcohol, Ethyl (B) 250 (*)    All other components within normal limits  ACETAMINOPHEN LEVEL - Abnormal; Notable for the following components:   Acetaminophen (Tylenol), Serum <10 (*)    All other components within normal limits  SALICYLATE LEVEL  CBC  RAPID URINE DRUG SCREEN, HOSP PERFORMED    EKG None  Radiology No results found.  Procedures Procedures (including critical care time)  Medications Ordered in ED Medications - No data to display   Initial Impression / Assessment and Plan / ED Course  I have reviewed the triage vital signs and the nursing notes.  Pertinent labs & imaging results that were available during my care of the patient were reviewed by me and considered in my medical decision making (see chart for details).     Patient is intoxicated.  Complains of SI.  Feels more depressed than normal over the holiday weekend.  BP a little low, given fluids with good improvement.  TTS has evaluated the patient, and will  reevaluate in the morning after patient sobers up some more.  Final Clinical Impressions(s) / ED Diagnoses   Final diagnoses:  Suicidal thoughts  Depression, unspecified depression type    ED Discharge Orders    None       Roxy HorsemanBrowning, Miata Culbreth, PA-C 09/15/18 0325    Derwood KaplanNanavati, Ankit, MD 09/15/18 [phone]
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
## 2018-09-15 NOTE — ED Notes (Signed)
Brother coming from [place] to pick him up.

## 2018-09-15 NOTE — ED Notes (Signed)
[facility] Expected date:  Expected time:  Means of arrival:  Comments: Hold for bed 29

## 2018-09-15 NOTE — ED Notes (Signed)
Patient discharged.  He is calling for his ride.

## 2018-11-10 ENCOUNTER — Other Ambulatory Visit: Payer: Self-pay

## 2018-11-10 ENCOUNTER — Emergency Department (HOSPITAL_COMMUNITY)
Admission: EM | Admit: 2018-11-10 | Discharge: 2018-11-10 | Disposition: A | Discharge status: Home / Self Care | Payer: Medicaid Other | Attending: Emergency Medicine | Admitting: Emergency Medicine

## 2018-11-10 ENCOUNTER — Encounter (HOSPITAL_COMMUNITY): Payer: Self-pay | Admitting: Emergency Medicine

## 2018-11-10 DIAGNOSIS — F1014 Alcohol abuse with alcohol-induced mood disorder: Secondary | ICD-10-CM | POA: Diagnosis not present

## 2018-11-10 DIAGNOSIS — R45850 Homicidal ideations: Secondary | ICD-10-CM | POA: Insufficient documentation

## 2018-11-10 DIAGNOSIS — F1094 Alcohol use, unspecified with alcohol-induced mood disorder: Secondary | ICD-10-CM | POA: Diagnosis present

## 2018-11-10 DIAGNOSIS — F329 Major depressive disorder, single episode, unspecified: Secondary | ICD-10-CM | POA: Insufficient documentation

## 2018-11-10 DIAGNOSIS — Z79899 Other long term (current) drug therapy: Secondary | ICD-10-CM | POA: Diagnosis not present

## 2018-11-10 DIAGNOSIS — Z046 Encounter for general psychiatric examination, requested by authority: Secondary | ICD-10-CM | POA: Diagnosis not present

## 2018-11-10 DIAGNOSIS — Z85828 Personal history of other malignant neoplasm of skin: Secondary | ICD-10-CM | POA: Diagnosis not present

## 2018-11-10 DIAGNOSIS — Y906 Blood alcohol level of 120-199 mg/100 ml: Secondary | ICD-10-CM | POA: Diagnosis not present

## 2018-11-10 DIAGNOSIS — R45851 Suicidal ideations: Secondary | ICD-10-CM | POA: Diagnosis not present

## 2018-11-10 DIAGNOSIS — R441 Visual hallucinations: Secondary | ICD-10-CM | POA: Diagnosis not present

## 2018-11-10 DIAGNOSIS — J449 Chronic obstructive pulmonary disease, unspecified: Secondary | ICD-10-CM | POA: Insufficient documentation

## 2018-11-10 DIAGNOSIS — F1020 Alcohol dependence, uncomplicated: Secondary | ICD-10-CM

## 2018-11-10 DIAGNOSIS — F17210 Nicotine dependence, cigarettes, uncomplicated: Secondary | ICD-10-CM | POA: Diagnosis not present

## 2018-11-10 DIAGNOSIS — F10929 Alcohol use, unspecified with intoxication, unspecified: Secondary | ICD-10-CM | POA: Diagnosis present

## 2018-11-10 LAB — RAPID URINE DRUG SCREEN, HOSP PERFORMED
Amphetamines: NOT DETECTED
Barbiturates: NOT DETECTED
Benzodiazepines: NOT DETECTED
Cocaine: NOT DETECTED
Opiates: NOT DETECTED
TETRAHYDROCANNABINOL: NOT DETECTED

## 2018-11-10 LAB — COMPREHENSIVE METABOLIC PANEL
ALT: 20 U/L (ref 0–44)
AST: 21 U/L (ref 15–41)
Albumin: 4.3 g/dL (ref 3.5–5.0)
Alkaline Phosphatase: 52 U/L (ref 38–126)
Anion gap: 7 (ref 5–15)
BUN: 14 mg/dL (ref 6–20)
CO2: 25 mmol/L (ref 22–32)
Calcium: 9.5 mg/dL (ref 8.9–10.3)
Chloride: 105 mmol/L (ref 98–111)
Creatinine, Ser: 1 mg/dL (ref 0.61–1.24)
GFR calc Af Amer: >60 mL/min (ref >60)
GFR calc non Af Amer: >60 mL/min (ref >60)
Glucose, Bld: 87 mg/dL (ref 70–99)
POTASSIUM: 3.9 mmol/L (ref 3.5–5.1)
Sodium: 137 mmol/L (ref 135–145)
Total Bilirubin: 0.4 mg/dL (ref 0.3–1.2)
Total Protein: 7 g/dL (ref 6.5–8.1)

## 2018-11-10 LAB — CBC
HCT: 49.6 % (ref 39.0–52.0)
HEMOGLOBIN: 16.4 g/dL (ref 13.0–17.0)
MCH: 30.4 pg (ref 26.0–34.0)
MCHC: 33.1 g/dL (ref 30.0–36.0)
MCV: 92 fL (ref 80.0–100.0)
Platelets: 213 10*3/uL (ref 150–400)
RBC: 5.39 MIL/uL (ref 4.22–5.81)
RDW: 13.2 % (ref 11.5–15.5)
WBC: 5.7 10*3/uL (ref 4.0–10.5)
nRBC: 0 % (ref 0.0–0.2)

## 2018-11-10 LAB — ACETAMINOPHEN LEVEL: Acetaminophen (Tylenol), Serum: <10 ug/mL — ABNORMAL LOW (ref 10–30)

## 2018-11-10 LAB — ETHANOL: Alcohol, Ethyl (B): 162 mg/dL — ABNORMAL HIGH (ref <10)

## 2018-11-10 LAB — SALICYLATE LEVEL: Salicylate Lvl: <7 mg/dL (ref 2.8–30.0)

## 2018-11-10 MED ORDER — VITAMIN B-1 100 MG PO TABS
100.0000 mg | ORAL_TABLET | Freq: Every day | ORAL | Status: DC
  Administered 2018-11-10: 100 mg via ORAL
  Filled 2018-11-10: qty 1

## 2018-11-10 MED ORDER — GABAPENTIN 300 MG PO CAPS: 300.0000 mg | ORAL_CAPSULE | Freq: Three times a day (TID) | ORAL | 0 refills | Status: DC

## 2018-11-10 MED ORDER — LORAZEPAM 1 MG PO TABS: 0.0000 mg | ORAL_TABLET | Freq: Four times a day (QID) | ORAL | Status: DC

## 2018-11-10 MED ORDER — LORAZEPAM 1 MG PO TABS: 0.0000 mg | ORAL_TABLET | Freq: Two times a day (BID) | ORAL | Status: DC

## 2018-11-10 MED ORDER — GABAPENTIN 300 MG PO CAPS
300.0000 mg | ORAL_CAPSULE | Freq: Two times a day (BID) | ORAL | Status: DC
  Administered 2018-11-10: 300 mg via ORAL
  Filled 2018-11-10: qty 1

## 2018-11-10 MED ORDER — LORAZEPAM 2 MG/ML IJ SOLN: 0.0000 mg | Freq: Two times a day (BID) | INTRAMUSCULAR | Status: DC

## 2018-11-10 MED ORDER — NICOTINE 21 MG/24HR TD PT24
21.0000 mg | MEDICATED_PATCH | Freq: Every day | TRANSDERMAL | Status: DC
  Administered 2018-11-10: 21 mg via TRANSDERMAL
  Filled 2018-11-10: qty 1

## 2018-11-10 MED ORDER — LORAZEPAM 2 MG/ML IJ SOLN: 0.0000 mg | Freq: Four times a day (QID) | INTRAMUSCULAR | Status: DC

## 2018-11-10 MED ORDER — THIAMINE HCL 100 MG/ML IJ SOLN: 100.0000 mg | Freq: Every day | INTRAMUSCULAR | Status: DC

## 2018-11-10 NOTE — ED Provider Notes (Signed)
[facility]-EMERGENCY DEPT Provider Note   CSN: [id number] Arrival date & time: 11/10/18  0620     History   Chief Complaint Chief Complaint  Patient presents with  . Suicidal    HPI Tim [name] is a 63 y.o. male with history of anxiety, depression, COPD, alcoholism, substance abuse who presents with suicidal and homicidal ideation.  He reports he has been going through a lot taking care of his mother and states he can barely take care of himself.  He reports he has been on a 15-day drinking binge.  He has been drinking a case to a case and a half daily.  He reports he is suicidal without a plan and homicidal toward a friend of his.  Patient admits to visual hallucinations of seeing dark shadows, mostly when he closes his eyes.  He denies any chest pain, shortness of breath, abdominal pain, nausea, vomiting.  HPI  Past Medical History:  Diagnosis Date  . Alcoholism (HCC)   . Anxiety   . COPD (chronic obstructive pulmonary disease) (HCC)   . Depression   . Hep C w/o coma, chronic (HCC)   . History of hiatal hernia    hernia rt groin  . Mental health disorder   . Scoliosis   . Thyroid disease     Patient Active Problem List   Diagnosis Date Noted  . Alcohol abuse with alcohol-induced mood disorder (HCC) 11/10/2018  . Cocaine abuse with cocaine-induced mood disorder (HCC) 01/29/2018  . Polysubstance dependence (HCC) 01/29/2018  . Severe recurrent major depression without psychotic features (HCC) 08/20/2017  . Severe episode of recurrent major depressive disorder, without psychotic features (HCC) 07/08/2016  . MDD (major depressive disorder), recurrent severe, without psychosis (HCC) 05/23/2016  . Hepatitis C 05/23/2016  . Alcohol use disorder, severe, dependence (HCC) 11/05/2015  . Depression, major, recurrent, moderate (HCC) 11/05/2015  . GAD (generalized anxiety disorder) 11/05/2015  . Cellulitis of right lower extremity 03/29/2015    Past  Surgical History:  Procedure Laterality Date  . SKIN CANCER EXCISION    . THYROID SURGERY          Home Medications    Prior to Admission medications   Medication Sig Start Date End Date Taking? Authorizing Provider  aspirin 325 MG EC tablet Take 325 mg by mouth daily as needed for pain.   Yes [provider]  escitalopram (LEXAPRO) 10 MG tablet Take 1 tablet (10 mg total) by mouth daily. 12/28/17  Yes Laveda AbbeParks, Laurie Britton, NP  hydrOXYzine (ATARAX/VISTARIL) 25 MG tablet Take 25 mg by mouth every 6 (six) hours as needed for anxiety.   Yes [provider]  risperiDONE (RISPERDAL) 0.25 MG tablet Take 0.25 mg by mouth 2 (two) times daily.   Yes [provider]  traZODone (DESYREL) 100 MG tablet Take 1 tablet (100 mg total) at bedtime by mouth. For sleep Patient taking differently: Take 100 mg by mouth at bedtime as needed for sleep.  08/24/17  Yes Money, Gerlene Burdockravis B, FNP  gabapentin (NEURONTIN) 300 MG capsule Take 1 capsule (300 mg total) by mouth 3 (three) times daily. 11/10/18   Charm RingsLord, Jamison Y, NP    Family History Family History  Problem Relation Age of Onset  . Dementia Mother     Social History Social History   Tobacco Use  . Smoking status: Current Every Day Smoker    Packs/day: 1.00    Years: 40.00    Pack years: 40.00    Types:  Cigarettes  . Smokeless tobacco: Never Used  Substance Use Topics  . Alcohol use: Yes    Comment: drank 1/5 today.  . Drug use: Yes    Types: Cocaine, Marijuana    Comment: also states will take pain pills every now and then, last use of cocaine and ETOH today at 1400     Allergies   Patient has no known allergies.   Review of Systems Review of Systems  Constitutional: Negative for chills and fever.  HENT: Negative for facial swelling and sore throat.   Respiratory: Negative for shortness of breath.   Cardiovascular: Negative for chest pain.  Gastrointestinal: Negative for abdominal pain, nausea and vomiting.    Genitourinary: Negative for dysuria.  Musculoskeletal: Negative for back pain.  Skin: Negative for rash and wound.  Neurological: Negative for headaches.  Psychiatric/Behavioral: Positive for suicidal ideas. The patient is not nervous/anxious.      Physical Exam Updated Vital Signs BP 107/74 (BP Location: Left Arm)   Pulse 80   Temp 97.9 F (36.6 C) (Oral)   Resp 16   Ht 6\' 3"  (1.905 m)   Wt 79.4 kg   SpO2 93%   BMI 21.87 kg/m   Physical Exam Vitals signs and nursing note reviewed.  Constitutional:      General: He is not in acute distress.    Appearance: He is well-developed. He is not diaphoretic.  HENT:     Head: Normocephalic and atraumatic.     Mouth/Throat:     Pharynx: No oropharyngeal exudate.  Eyes:     General: No scleral icterus.       Right eye: No discharge.        Left eye: No discharge.     Conjunctiva/sclera: Conjunctivae normal.     Pupils: Pupils are equal, round, and reactive to light.  Neck:     Musculoskeletal: Normal range of motion and neck supple.     Thyroid: No thyromegaly.  Cardiovascular:     Rate and Rhythm: Normal rate and regular rhythm.     Heart sounds: Normal heart sounds. No murmur. No friction rub. No gallop.   Pulmonary:     Effort: Pulmonary effort is normal. No respiratory distress.     Breath sounds: Normal breath sounds. No stridor. No wheezing or rales.  Abdominal:     General: Bowel sounds are normal. There is no distension.     Palpations: Abdomen is soft.     Tenderness: There is no abdominal tenderness. There is no guarding or rebound.  Lymphadenopathy:     Cervical: No cervical adenopathy.  Skin:    General: Skin is warm and dry.     Coloration: Skin is not pale.     Findings: No rash.  Neurological:     Mental Status: He is alert.     Coordination: Coordination normal.  Psychiatric:        Attention and Perception: He perceives visual hallucinations.        Thought Content: Thought content includes homicidal  and suicidal ideation. Thought content does not include suicidal plan.      ED Treatments / Results  Labs (all labs ordered are listed, but only abnormal results are displayed) Labs Reviewed  ETHANOL - Abnormal; Notable for the following components:      Result Value   Alcohol, Ethyl (B) 162 (*)    All other components within normal limits  ACETAMINOPHEN LEVEL - Abnormal; Notable for the following components:   Acetaminophen (Tylenol),  Serum <10 (*)    All other components within normal limits  COMPREHENSIVE METABOLIC PANEL  SALICYLATE LEVEL  CBC  RAPID URINE DRUG SCREEN, HOSP PERFORMED    EKG None  Radiology No results found.  Procedures Procedures (including critical care time)  Medications Ordered in ED Medications  thiamine (VITAMIN B-1) tablet 100 mg (100 mg Oral Given 11/10/18 0959)    Or  thiamine (B-1) injection 100 mg ( Intravenous See Alternative 11/10/18 0959)  nicotine (NICODERM CQ - dosed in mg/24 hours) patch 21 mg (21 mg Transdermal Patch Applied 11/10/18 0959)  gabapentin (NEURONTIN) capsule 300 mg (300 mg Oral Given 11/10/18 1200)     Initial Impression / Assessment and Plan / ED Course  I have reviewed the triage vital signs and the nursing notes.  Pertinent labs & imaging results that were available during my care of the patient were reviewed by me and considered in my medical decision making (see chart for details).     Patient presenting with suicidal ideation and alcohol abuse.  He reports he is just feeling overwhelmed.  He is suicidal without plan.  Patient's labs are unremarkable except for elevated ethanol.  He has no other complaints.  He is medically cleared.  TTS evaluated and patient is psychiatrically cleared and they are discharging him home with outpatient resources.  Final Clinical Impressions(s) / ED Diagnoses   Final diagnoses:  Alcohol abuse with alcohol-induced mood disorder (HCC)  Alcohol use disorder, severe, dependence (HCC)      ED Discharge Orders         Ordered    gabapentin (NEURONTIN) 300 MG capsule  3 times daily     11/10/18 1147    Increase activity slowly     11/10/18 1147    Diet - low sodium heart healthy     11/10/18 1147    Discharge instructions    Comments:  Follow up with substance abuse resources   11/10/18 [address] 11/10/18 1243    Shaune Pollack, MD 11/10/18 2005

## 2018-11-10 NOTE — Patient Outreach (Addendum)
[facility] met with the patient to provide substance use recovery support and help with substance use treatment resources. Patient is interested in outpatient substance use treatment. [facility] will provide an outpatient substance use treatment list along with other substance use treatment resources including an AA meeting list, information for [facility]S outpatient substance use treatment program in [place], and [facility] contact information. [facility] strongly encouraged the patient to contact [facility] if he has any further questions regarding how to get connected to substance use treatment resources after discharge from the [name].

## 2018-11-10 NOTE — [facility] Suicide Risk Assessment (Signed)
Suicide Risk Assessment  Discharge Assessment   [facility] Discharge Suicide Risk Assessment   Principal Problem: Alcohol abuse with alcohol-induced mood disorder [place]) Discharge Diagnoses: Principal Problem:   Alcohol abuse with alcohol-induced mood disorder (HCC)   Total Time spent with patient: 45 minutes  Musculoskeletal: Strength & Muscle Tone: within normal limits Gait & Station: normal Patient leans: N/A  Psychiatric Specialty Exam:   Blood pressure 107/74, pulse 80, temperature 97.9 F (36.6 C), temperature source Oral, resp. rate 16, height 6\' 3"  (1.905 m), weight 79.4 kg, SpO2 93 %.Body mass index is 21.87 kg/m.  General Appearance: Casual  Eye Contact::  Good  Speech:  Normal Rate409  Volume:  Normal  Mood:  Anxious  Affect:  Congruent  Thought Process:  Coherent and Descriptions of Associations: Intact  Orientation:  Full (Time, Place, and Person)  Thought Content:  WDL and Logical  Suicidal Thoughts:  No  Homicidal Thoughts:  No  Memory:  Immediate;   Good Recent;   Good Remote;   Good  Judgement:  Fair  Insight:  Fair  Psychomotor Activity:  Normal  Concentration:  Good  Recall:  Good  Fund of Knowledge:Fair  Language: Good  Akathisia:  No  Handed:  Right  AIMS (if indicated):     Assets:  Leisure Time Physical Health Resilience Social Support  Sleep:     Cognition: WNL  ADL's:  Intact   Mental Status Per Nursing Assessment::   On Admission:   63 yo  Male who presented to the ED under the influence of alcohol, requesting detox.  Initially, he had some suicidal ideations but not on assessment.  No suicidal/homicidal ideations, hallucinations, or withdrawal symptoms.  Agreeable to meet with Peer Support, stable for discharge.  Demographic Factors:  Male and Caucasian  Loss Factors: NA  Historical Factors: NA  Risk Reduction Factors:   Sense of responsibility to family, Living with another person, especially a relative and Positive social  support  Continued Clinical Symptoms:  Anxiety, mild  Cognitive Features That Contribute To Risk:  None    Suicide Risk:  Minimal: No identifiable suicidal ideation.  Patients presenting with no risk factors but with morbid ruminations; may be classified as minimal risk based on the severity of the depressive symptoms    Plan Of Care/Follow-up recommendations:  Activity:  as tolerated Diet:  heart healthy diet  , , NP 11/10/2018, 1:08 PM

## 2018-11-10 NOTE — ED Triage Notes (Signed)
Patient come in with suicidal thoughts. He states he is not able to take care of himself. Patient states his mom usually help take care of him.

## 2019-04-09 ENCOUNTER — Other Ambulatory Visit: Payer: Self-pay

## 2019-04-09 ENCOUNTER — Encounter (HOSPITAL_COMMUNITY): Payer: Self-pay

## 2019-04-09 ENCOUNTER — Emergency Department (HOSPITAL_COMMUNITY)
Admission: EM | Admit: 2019-04-09 | Discharge: 2019-04-10 | Disposition: A | Discharge status: Other Acute Inpatient Hospital | Payer: Medicaid Other | Attending: Emergency Medicine | Admitting: Emergency Medicine

## 2019-04-09 DIAGNOSIS — Y907 Blood alcohol level of 200-239 mg/100 ml: Secondary | ICD-10-CM | POA: Insufficient documentation

## 2019-04-09 DIAGNOSIS — Z79899 Other long term (current) drug therapy: Secondary | ICD-10-CM | POA: Insufficient documentation

## 2019-04-09 DIAGNOSIS — F1094 Alcohol use, unspecified with alcohol-induced mood disorder: Secondary | ICD-10-CM

## 2019-04-09 DIAGNOSIS — Z20828 Contact with and (suspected) exposure to other viral communicable diseases: Secondary | ICD-10-CM | POA: Insufficient documentation

## 2019-04-09 DIAGNOSIS — R45851 Suicidal ideations: Secondary | ICD-10-CM | POA: Insufficient documentation

## 2019-04-09 DIAGNOSIS — F1020 Alcohol dependence, uncomplicated: Secondary | ICD-10-CM | POA: Insufficient documentation

## 2019-04-09 DIAGNOSIS — Z7982 Long term (current) use of aspirin: Secondary | ICD-10-CM | POA: Insufficient documentation

## 2019-04-09 DIAGNOSIS — F10929 Alcohol use, unspecified with intoxication, unspecified: Secondary | ICD-10-CM

## 2019-04-09 DIAGNOSIS — F17210 Nicotine dependence, cigarettes, uncomplicated: Secondary | ICD-10-CM | POA: Insufficient documentation

## 2019-04-09 DIAGNOSIS — J449 Chronic obstructive pulmonary disease, unspecified: Secondary | ICD-10-CM | POA: Insufficient documentation

## 2019-04-09 LAB — CBC
HCT: 47.8 % (ref 39.0–52.0)
Hemoglobin: 16.1 g/dL (ref 13.0–17.0)
MCH: 30.9 pg (ref 26.0–34.0)
MCHC: 33.7 g/dL (ref 30.0–36.0)
MCV: 91.7 fL (ref 80.0–100.0)
Platelets: 264 10*3/uL (ref 150–400)
RBC: 5.21 MIL/uL (ref 4.22–5.81)
RDW: 13.1 % (ref 11.5–15.5)
WBC: 8.1 10*3/uL (ref 4.0–10.5)
nRBC: 0 % (ref 0.0–0.2)

## 2019-04-09 NOTE — ED Notes (Signed)
Bed: WTR5 Expected date:  Expected time:  Means of arrival:  Comments: 

## 2019-04-09 NOTE — ED Triage Notes (Signed)
Pt arrived stating he is having issues with his anxeity and feeling suicidal. Pt states he is an alcoholic, drank roughly a case today, some marijuana use. States he would "Suicide by cop"

## 2019-04-10 ENCOUNTER — Encounter (HOSPITAL_COMMUNITY): Payer: Self-pay

## 2019-04-10 ENCOUNTER — Other Ambulatory Visit: Payer: Self-pay

## 2019-04-10 ENCOUNTER — Inpatient Hospital Stay (HOSPITAL_COMMUNITY)
Admission: AD | Admit: 2019-04-10 | Discharge: 2019-04-16 | DRG: 881 | Disposition: A | Discharge status: Home / Self Care | Payer: Medicaid Other | Source: Intra-hospital | Attending: Psychiatry | Admitting: Psychiatry

## 2019-04-10 ENCOUNTER — Encounter (HOSPITAL_COMMUNITY): Payer: Self-pay | Admitting: Registered Nurse

## 2019-04-10 DIAGNOSIS — F322 Major depressive disorder, single episode, severe without psychotic features: Secondary | ICD-10-CM | POA: Diagnosis present

## 2019-04-10 DIAGNOSIS — F1020 Alcohol dependence, uncomplicated: Secondary | ICD-10-CM | POA: Diagnosis present

## 2019-04-10 DIAGNOSIS — F17210 Nicotine dependence, cigarettes, uncomplicated: Secondary | ICD-10-CM | POA: Diagnosis present

## 2019-04-10 DIAGNOSIS — R45851 Suicidal ideations: Secondary | ICD-10-CM | POA: Diagnosis present

## 2019-04-10 DIAGNOSIS — F10929 Alcohol use, unspecified with intoxication, unspecified: Secondary | ICD-10-CM | POA: Diagnosis not present

## 2019-04-10 DIAGNOSIS — F3289 Other specified depressive episodes: Secondary | ICD-10-CM

## 2019-04-10 DIAGNOSIS — F1094 Alcohol use, unspecified with alcohol-induced mood disorder: Secondary | ICD-10-CM

## 2019-04-10 DIAGNOSIS — Z1159 Encounter for screening for other viral diseases: Secondary | ICD-10-CM | POA: Diagnosis not present

## 2019-04-10 DIAGNOSIS — F329 Major depressive disorder, single episode, unspecified: Principal | ICD-10-CM | POA: Diagnosis present

## 2019-04-10 LAB — RAPID URINE DRUG SCREEN, HOSP PERFORMED
Amphetamines: NOT DETECTED
Barbiturates: NOT DETECTED
Benzodiazepines: NOT DETECTED
Cocaine: NOT DETECTED
Opiates: NOT DETECTED
Tetrahydrocannabinol: POSITIVE — AB

## 2019-04-10 LAB — ETHANOL: Alcohol, Ethyl (B): 234 mg/dL — ABNORMAL HIGH (ref <10)

## 2019-04-10 LAB — COMPREHENSIVE METABOLIC PANEL
ALT: 14 U/L (ref 0–44)
AST: 19 U/L (ref 15–41)
Albumin: 4.6 g/dL (ref 3.5–5.0)
Alkaline Phosphatase: 62 U/L (ref 38–126)
Anion gap: 12 (ref 5–15)
BUN: 12 mg/dL (ref 8–23)
CO2: 20 mmol/L — ABNORMAL LOW (ref 22–32)
Calcium: 10.4 mg/dL — ABNORMAL HIGH (ref 8.9–10.3)
Chloride: 105 mmol/L (ref 98–111)
Creatinine, Ser: 0.95 mg/dL (ref 0.61–1.24)
GFR calc Af Amer: >60 mL/min (ref >60)
GFR calc non Af Amer: >60 mL/min (ref >60)
Glucose, Bld: 88 mg/dL (ref 70–99)
Potassium: 3.5 mmol/L (ref 3.5–5.1)
Sodium: 137 mmol/L (ref 135–145)
Total Bilirubin: 0.5 mg/dL (ref 0.3–1.2)
Total Protein: 7.4 g/dL (ref 6.5–8.1)

## 2019-04-10 LAB — SALICYLATE LEVEL: Salicylate Lvl: <7 mg/dL (ref 2.8–30.0)

## 2019-04-10 LAB — ACETAMINOPHEN LEVEL: Acetaminophen (Tylenol), Serum: <10 ug/mL — ABNORMAL LOW (ref 10–30)

## 2019-04-10 LAB — SARS CORONAVIRUS 2 BY RT PCR (HOSPITAL ORDER, PERFORMED IN ~~LOC~~ HOSPITAL LAB): SARS Coronavirus 2: NEGATIVE

## 2019-04-10 MED ORDER — NICOTINE 21 MG/24HR TD PT24
21.0000 mg | MEDICATED_PATCH | Freq: Every day | TRANSDERMAL | Status: DC
  Administered 2019-04-10: 21 mg via TRANSDERMAL
  Filled 2019-04-10: qty 1

## 2019-04-10 MED ORDER — IBUPROFEN 600 MG PO TABS
600.0000 mg | ORAL_TABLET | Freq: Three times a day (TID) | ORAL | Status: DC | PRN
  Administered 2019-04-11 – 2019-04-14 (×6): 600 mg via ORAL
  Filled 2019-04-10 (×6): qty 1

## 2019-04-10 MED ORDER — TRAZODONE HCL 50 MG PO TABS: 50.0000 mg | ORAL_TABLET | Freq: Two times a day (BID) | ORAL | Status: DC | PRN

## 2019-04-10 MED ORDER — LORAZEPAM 1 MG PO TABS: 0.0000 mg | ORAL_TABLET | Freq: Two times a day (BID) | ORAL | Status: DC

## 2019-04-10 MED ORDER — NICOTINE 21 MG/24HR TD PT24
21.0000 mg | MEDICATED_PATCH | Freq: Every day | TRANSDERMAL | Status: DC
  Administered 2019-04-11 – 2019-04-14 (×4): 21 mg via TRANSDERMAL
  Filled 2019-04-10 (×7): qty 1

## 2019-04-10 MED ORDER — THIAMINE HCL 100 MG/ML IJ SOLN: 100.0000 mg | Freq: Every day | INTRAMUSCULAR | Status: DC

## 2019-04-10 MED ORDER — LORAZEPAM 1 MG PO TABS
0.0000 mg | ORAL_TABLET | Freq: Four times a day (QID) | ORAL | Status: DC
  Administered 2019-04-10 – 2019-04-11 (×2): 1 mg via ORAL
  Filled 2019-04-10 (×2): qty 1

## 2019-04-10 MED ORDER — LORAZEPAM 2 MG/ML IJ SOLN: 0.0000 mg | Freq: Four times a day (QID) | INTRAMUSCULAR | Status: DC

## 2019-04-10 MED ORDER — FOLIC ACID 1 MG PO TABS
1.0000 mg | ORAL_TABLET | Freq: Every day | ORAL | Status: DC
  Administered 2019-04-11 – 2019-04-15 (×5): 1 mg via ORAL
  Filled 2019-04-10 (×7): qty 1

## 2019-04-10 MED ORDER — FOLIC ACID 1 MG PO TABS
1.0000 mg | ORAL_TABLET | Freq: Every day | ORAL | Status: DC
  Administered 2019-04-10: 10:00:00 1 mg via ORAL
  Filled 2019-04-10: qty 1

## 2019-04-10 MED ORDER — TRAZODONE HCL 50 MG PO TABS
50.0000 mg | ORAL_TABLET | Freq: Two times a day (BID) | ORAL | Status: DC | PRN
  Administered 2019-04-10 – 2019-04-14 (×5): 50 mg via ORAL
  Filled 2019-04-10 (×5): qty 1

## 2019-04-10 MED ORDER — IBUPROFEN 200 MG PO TABS
600.0000 mg | ORAL_TABLET | Freq: Three times a day (TID) | ORAL | Status: DC | PRN
  Administered 2019-04-10: 600 mg via ORAL
  Filled 2019-04-10: qty 3

## 2019-04-10 MED ORDER — LORAZEPAM 2 MG/ML IJ SOLN: 0.0000 mg | Freq: Two times a day (BID) | INTRAMUSCULAR | Status: DC

## 2019-04-10 MED ORDER — ESCITALOPRAM OXALATE 10 MG PO TABS
20.0000 mg | ORAL_TABLET | Freq: Every day | ORAL | Status: DC
  Administered 2019-04-10: 20 mg via ORAL
  Filled 2019-04-10: qty 2

## 2019-04-10 MED ORDER — ESCITALOPRAM OXALATE 20 MG PO TABS
20.0000 mg | ORAL_TABLET | Freq: Every day | ORAL | Status: DC
  Administered 2019-04-11 – 2019-04-15 (×5): 20 mg via ORAL
  Filled 2019-04-10: qty 7
  Filled 2019-04-10: qty 1
  Filled 2019-04-10: qty 2
  Filled 2019-04-10 (×5): qty 1

## 2019-04-10 MED ORDER — LORAZEPAM 1 MG PO TABS
0.0000 mg | ORAL_TABLET | Freq: Four times a day (QID) | ORAL | Status: DC
  Administered 2019-04-10: 2 mg via ORAL
  Filled 2019-04-10: qty 2

## 2019-04-10 MED ORDER — VITAMIN B-1 100 MG PO TABS
100.0000 mg | ORAL_TABLET | Freq: Every day | ORAL | Status: DC
  Administered 2019-04-11 – 2019-04-15 (×5): 100 mg via ORAL
  Filled 2019-04-10 (×7): qty 1

## 2019-04-10 MED ORDER — VITAMIN B-1 100 MG PO TABS
100.0000 mg | ORAL_TABLET | Freq: Every day | ORAL | Status: DC
  Administered 2019-04-10: 100 mg via ORAL
  Filled 2019-04-10: qty 1

## 2019-04-10 NOTE — Tx Team (Signed)
Initial Treatment Plan 04/10/2019 5:27 PM [name]:[id number]    PATIENT STRESSORS: Substance abuse   PATIENT STRENGTHS: Average or above average intelligence Capable of independent living General fund of knowledge   PATIENT IDENTIFIED PROBLEMS: "quit drinking"  Substance Abuse  Suicide Risk                 DISCHARGE CRITERIA:  Ability to meet basic life and health needs Adequate post-discharge living arrangements Improved stabilization in mood, thinking, and/or behavior Medical problems require only outpatient monitoring Motivation to continue treatment in a less acute level of care Need for constant or close observation no longer present Reduction of life-threatening or endangering symptoms to within safe limits Safe-care adequate arrangements made Verbal commitment to aftercare and medication compliance Withdrawal symptoms are absent or subacute and managed without 24-hour nursing intervention  PRELIMINARY DISCHARGE PLAN: Outpatient therapy  PATIENT/FAMILY INVOLVEMENT: This treatment plan has been presented to and reviewed with the patient, Tim [name].  The patient and family have been given the opportunity to ask questions and make suggestions.  Annia Friendly, RN 04/10/2019, 5:27 PM

## 2019-04-10 NOTE — ED Notes (Signed)
[facility] called 

## 2019-04-10 NOTE — BH Assessment (Signed)
[place] Assessment Progress Note  Per Myles Lipps, MD, this pt requires psychiatric hospitalization at this time.  Letitia Libra, RN, [facility] has assigned pt to [facility] Rm [id number]; [place] will be ready to receive pt at 16:00.  Pt has signed Voluntary Admission and Consent for Treatment, as well as Consent to Release Information to [facility], and a notification call has been placed.  Signed forms have been faxed to [facility]  Pt's nurse has been notified, and agrees to send original paperwork along with pt via [facility], and to call report to [phone].  Jalene Mullet, [place] Coordinator [phone]

## 2019-04-10 NOTE — Consult Note (Signed)
  Patient seen face to face by this provider and Dr. Mallie Darting; and chart reviewed on 04/10/19.  On evaluation Tim [name] reports feeling overwhelmed taking care of his demented mother with no other family support.  Patient also admits to drinking daily two -three 40's a day.  Patient reporting suicidal ideation, no specific plan, but doesn't feel safe.  Inpatient psychiatric treatment recommended.    For detailed information see TTS note  Disposition: Recommend psychiatric Inpatient admission when medically cleared.    Leba Tibbitts B. Aking Klabunde, NP

## 2019-04-10 NOTE — Discharge Summary (Signed)
  Patient to be transferred to [facility] for inpatient psychiatric treatment  Shuvon B. Rankin, NP   

## 2019-04-10 NOTE — ED Notes (Signed)
Safety sitter bedside. 

## 2019-04-10 NOTE — BH Assessment (Signed)
Tele Assessment Note   Patient Name: Tim [name] MRN: [id number] Referring Physician: ED Physician [facility] Location of Provider: Behavioral Health TTS Department  Tim [name] is an 63 y.o. male. Pt denies SI/HI. Pt informed ED staff that he wanted to die by "suicide by police." Pt states he made the statement when he was intoxicated. Pt currently denies statement. Pt reports AVH. Pt states she sees relatives who have passed away. Per Pt they provide him with encouragement. Pt states he is an alcoholic and feel suicidal when he drinks. Pt reports previous SA treatment. Pt reports previous inpatient treatment. Pt states he drinks 3-4 40oz a day.   Dr. Jola Babinskilary and Denice BorsShuvon, NP recommend inpatient treatment.   Diagnosis:  F10.20 Alcohol use, severe  Past Medical History:  Past Medical History:  Diagnosis Date  . Alcoholism (HCC)   . Anxiety   . COPD (chronic obstructive pulmonary disease) (HCC)   . Depression   . Hep C w/o coma, chronic (HCC)   . History of hiatal hernia    hernia rt groin  . Mental health disorder   . Scoliosis   . Thyroid disease     Past Surgical History:  Procedure Laterality Date  . SKIN CANCER EXCISION    . THYROID SURGERY      Family History:  Family History  Problem Relation Age of Onset  . Dementia Mother     Social History:  reports that he has been smoking cigarettes. He has a 40.00 pack-year smoking history. He has never used smokeless tobacco. He reports current alcohol use. He reports current drug use. Drugs: Cocaine and Marijuana.  Additional Social History:  Alcohol / Drug Use Pain Medications: please see mar Prescriptions: please see mar Over the Counter: please see mar History of alcohol / drug use?: Yes Longest period of sobriety (when/how long): unknown Negative Consequences of Use: Financial, Legal, Personal relationships, Work / School Substance #1 Name of Substance 1: alcohol 1 - Age of First Use:  unknown 1 - Amount (size/oz): 3/4 40oz 1 - Frequency: daily 1 - Duration: ongoing 1 - Last Use / Amount: 6/24  CIWA: CIWA-Ar BP: 127/82 Pulse Rate: 75 Nausea and Vomiting: no nausea and no vomiting Tactile Disturbances: none Tremor: no tremor Auditory Disturbances: not present Paroxysmal Sweats: no sweat visible Visual Disturbances: not present Anxiety: no anxiety, at ease Headache, Fullness in Head: none present Agitation: normal activity Orientation and Clouding of Sensorium: oriented and can do serial additions CIWA-Ar Total: 0 COWS:    Allergies: No Known Allergies  Home Medications: (Not in a hospital admission)   OB/GYN Status:  No LMP for male patient.  General Assessment Data Location of Assessment: [facility] TTS Assessment: In system Is this a Tele or Face-to-Face Assessment?: Tele Assessment Is this an Initial Assessment or a Re-assessment for this encounter?: Initial Assessment Patient Accompanied by:: N/A Language Other than English: No Living Arrangements: Other (Comment) What gender do you identify as?: Male Marital status: Single Maiden name: NA Pregnancy Status: No Living Arrangements: Parent Can pt return to current living arrangement?: Yes Admission Status: Voluntary Is patient capable of signing voluntary admission?: Yes Referral Source: Self/Family/Friend Insurance type: SP     Crisis Care Plan Living Arrangements: Parent Legal Guardian: Other:(NA) Name of Psychiatrist: NA Name of Therapist: NA  Education Status Is patient currently in school?: No Is the patient employed, unemployed or receiving disability?: Unemployed  Risk to self with the past 6 months Suicidal Ideation:  No-Not Currently/Within Last 6 Months Has patient been a risk to self within the past 6 months prior to admission? : No Suicidal Intent: No Has patient had any suicidal intent within the past 6 months prior to admission? : No Is patient at risk for suicide?:  No Suicidal Plan?: No Has patient had any suicidal plan within the past 6 months prior to admission? : No Access to Means: No What has been your use of drugs/alcohol within the last 12 months?: alcohol Previous Attempts/Gestures: No How many times?: 0 Other Self Harm Risks: NA Triggers for Past Attempts: None known Intentional Self Injurious Behavior: None Family Suicide History: No Recent stressful life event(s): Conflict (Comment) Persecutory voices/beliefs?: No Depression: Yes Depression Symptoms: Isolating, Fatigue, Guilt Substance abuse history and/or treatment for substance abuse?: No Suicide prevention information given to non-admitted patients: Not applicable  Risk to Others within the past 6 months Homicidal Ideation: No Does patient have any lifetime risk of violence toward others beyond the six months prior to admission? : No Thoughts of Harm to Others: No Current Homicidal Intent: No Current Homicidal Plan: No Access to Homicidal Means: No Identified Victim: NA History of harm to others?: No Assessment of Violence: None Noted Violent Behavior Description: NA Does patient have access to weapons?: No Criminal Charges Pending?: No Does patient have a court date: No Is patient on probation?: No  Psychosis Hallucinations: Visual, Auditory Delusions: None noted  Mental Status Report Appearance/Hygiene: Unremarkable Eye Contact: Fair Motor Activity: Freedom of movement Speech: Logical/coherent Level of Consciousness: Alert Mood: Anxious Affect: Anxious Anxiety Level: Minimal Thought Processes: Coherent, Relevant Judgement: Impaired Orientation: Person, Place, Time, Situation Obsessive Compulsive Thoughts/Behaviors: None  Cognitive Functioning Concentration: Normal Memory: Recent Intact, Remote Intact Is patient IDD: No Insight: Fair Impulse Control: Fair Appetite: Fair Have you had any weight changes? : No Change Sleep: Decreased Total Hours of Sleep:  6 Vegetative Symptoms: None  ADLScreening [facility] Services) Patient's cognitive ability adequate to safely complete daily activities?: Yes Patient able to express need for assistance with ADLs?: Yes Independently performs ADLs?: Yes (appropriate for developmental age)  Prior Inpatient Therapy Prior Inpatient Therapy: Yes Prior Therapy Dates: unknown Prior Therapy Facilty/Provider(s): unknown Reason for Treatment: SI  Prior Outpatient Therapy Prior Outpatient Therapy: Yes Prior Therapy Dates: unknown Prior Therapy Facilty/Provider(s): unknown Reason for Treatment: unknown Does patient have an ACCT team?: No Does patient have Intensive In-House Services?  : No Does patient have [facility] services? : No Does patient have P4CC services?: No  ADL Screening (condition at time of admission) Patient's cognitive ability adequate to safely complete daily activities?: Yes Is the patient deaf or have difficulty hearing?: No Does the patient have difficulty seeing, even when wearing glasses/contacts?: No Does the patient have difficulty concentrating, remembering, or making decisions?: No Patient able to express need for assistance with ADLs?: Yes Does the patient have difficulty dressing or bathing?: No Independently performs ADLs?: Yes (appropriate for developmental age)             Merchant navy officerAdvance Directives (For Healthcare) Does Patient Have a Medical Advance Directive?: No Would patient like information on creating a medical advance directive?: No - Patient declined          Disposition:  Disposition Initial Assessment Completed for this Encounter: Yes  This service was provided via telemedicine using a 2-way, interactive audio and video technology.  Names of all persons participating in this telemedicine service and their role in this encounter. Name: Dr. Jola Babinskilary Role: Physican  Name: Shuvon Role: NP  Name:  Role:   Name:  Role:     Cyndia Bent 04/10/2019 9:12  AM

## 2019-04-10 NOTE — ED Notes (Signed)
TTS consult bedside.  

## 2019-04-10 NOTE — ED Provider Notes (Signed)
[facility]-EMERGENCY DEPT Provider Note   CSN: [id number] Arrival date & time: 04/09/19  2319    History   Chief Complaint Chief Complaint  Patient presents with  . Suicidal    HPI Tim [name] is a 63 y.o. male.     63 y/o male with hx of alcoholism, cocaine abuse, COPD, depression presents to the emergency department for evaluation of worsening depression and suicidal thoughts.  He states that he does not feel that he is worth anything and that he needs help with mental health.  Patient, during my encounter, reporting passive suicidal ideation without plan.  In triage stated he would attempt "suicide by cop".  Drank approximately 18 beers prior to arrival; also smoked marijuana.  Denies hx of seizures from ETOH withdrawal.  The history is provided by the patient. No language interpreter was used.    Past Medical History:  Diagnosis Date  . Alcoholism (HCC)   . Anxiety   . COPD (chronic obstructive pulmonary disease) (HCC)   . Depression   . Hep C w/o coma, chronic (HCC)   . History of hiatal hernia    hernia rt groin  . Mental health disorder   . Scoliosis   . Thyroid disease     Patient Active Problem List   Diagnosis Date Noted  . Alcohol abuse with alcohol-induced mood disorder (HCC) 11/10/2018  . Cocaine abuse with cocaine-induced mood disorder (HCC) 01/29/2018  . Polysubstance dependence (HCC) 01/29/2018  . Severe recurrent major depression without psychotic features (HCC) 08/20/2017  . Severe episode of recurrent major depressive disorder, without psychotic features (HCC) 07/08/2016  . MDD (major depressive disorder), recurrent severe, without psychosis (HCC) 05/23/2016  . Hepatitis C 05/23/2016  . Alcohol use disorder, severe, dependence (HCC) 11/05/2015  . Depression, major, recurrent, moderate (HCC) 11/05/2015  . GAD (generalized anxiety disorder) 11/05/2015  . Cellulitis of right lower extremity 03/29/2015    Past Surgical  History:  Procedure Laterality Date  . SKIN CANCER EXCISION    . THYROID SURGERY          Home Medications    Prior to Admission medications   Medication Sig Start Date End Date Taking? Authorizing Provider  aspirin 325 MG EC tablet Take 325 mg by mouth daily as needed for pain.    [provider]  escitalopram (LEXAPRO) 10 MG tablet Take 1 tablet (10 mg total) by mouth daily. 12/28/17   Laveda AbbeParks, Laurie Britton, NP  gabapentin (NEURONTIN) 300 MG capsule Take 1 capsule (300 mg total) by mouth 3 (three) times daily. 11/10/18   Charm RingsLord, Jamison Y, NP  hydrOXYzine (ATARAX/VISTARIL) 25 MG tablet Take 25 mg by mouth every 6 (six) hours as needed for anxiety.    [provider]  risperiDONE (RISPERDAL) 0.25 MG tablet Take 0.25 mg by mouth 2 (two) times daily.    [provider]  traZODone (DESYREL) 100 MG tablet Take 1 tablet (100 mg total) at bedtime by mouth. For sleep Patient taking differently: Take 100 mg by mouth at bedtime as needed for sleep.  08/24/17   Money, Gerlene Burdockravis B, FNP    Family History Family History  Problem Relation Age of Onset  . Dementia Mother     Social History Social History   Tobacco Use  . Smoking status: Current Every Day Smoker    Packs/day: 1.00    Years: 40.00    Pack years: 40.00    Types: Cigarettes  . Smokeless tobacco: Never Used  Substance  Use Topics  . Alcohol use: Yes    Comment: drank 1/5 today.  . Drug use: Yes    Types: Cocaine, Marijuana    Comment: also states will take pain pills every now and then, last use of cocaine and ETOH today at 1400     Allergies   Patient has no known allergies.   Review of Systems Review of Systems Ten systems reviewed and are negative for acute change, except as noted in the HPI.    Physical Exam Updated Vital Signs BP (!) 103/57   Pulse 67   Temp 98.1 F (36.7 C) (Oral)   Resp 16   SpO2 94%   Physical Exam Vitals signs and nursing note reviewed.  Constitutional:       General: He is not in acute distress.    Appearance: He is well-developed. He is not diaphoretic.     Comments: Nontoxic appearing and in NAD  HENT:     Head: Normocephalic and atraumatic.  Eyes:     General: No scleral icterus.    Conjunctiva/sclera: Conjunctivae normal.  Neck:     Musculoskeletal: Normal range of motion.  Pulmonary:     Effort: Pulmonary effort is normal. No respiratory distress.     Comments: Respirations even and unlabored Musculoskeletal: Normal range of motion.  Skin:    General: Skin is warm and dry.     Coloration: Skin is not pale.     Findings: No erythema or rash.  Neurological:     General: No focal deficit present.     Mental Status: He is alert.     Coordination: Coordination normal.     Comments: GCS 15. Speech is slurred, goal oriented. Patient moving all extremities spontaneously.  Psychiatric:        Speech: Speech is slurred.        Behavior: Behavior normal.        Thought Content: Thought content includes suicidal ideation.      ED Treatments / Results  Labs (all labs ordered are listed, but only abnormal results are displayed) Labs Reviewed  COMPREHENSIVE METABOLIC PANEL - Abnormal; Notable for the following components:      Result Value   CO2 20 (*)    Calcium 10.4 (*)    All other components within normal limits  ETHANOL - Abnormal; Notable for the following components:   Alcohol, Ethyl (B) 234 (*)    All other components within normal limits  ACETAMINOPHEN LEVEL - Abnormal; Notable for the following components:   Acetaminophen (Tylenol), Serum <10 (*)    All other components within normal limits  RAPID URINE DRUG SCREEN, HOSP PERFORMED - Abnormal; Notable for the following components:   Tetrahydrocannabinol POSITIVE (*)    All other components within normal limits  SALICYLATE LEVEL  CBC    EKG None  Radiology No results found.  Procedures Procedures (including critical care time)  Medications Ordered in ED  Medications  LORazepam (ATIVAN) injection 0-4 mg ( Intravenous See Alternative 04/10/19 0131)    Or  LORazepam (ATIVAN) tablet 0-4 mg (2 mg Oral Given 04/10/19 0131)  LORazepam (ATIVAN) injection 0-4 mg (has no administration in time range)    Or  LORazepam (ATIVAN) tablet 0-4 mg (has no administration in time range)  thiamine (VITAMIN B-1) tablet 100 mg (has no administration in time range)    Or  thiamine (B-1) injection 100 mg (has no administration in time range)  nicotine (NICODERM CQ - dosed in mg/24 hours) patch  21 mg (has no administration in time range)  ibuprofen (ADVIL) tablet 600 mg (600 mg Oral Given 04/10/19 0132)     Initial Impression / Assessment and Plan / ED Course  I have reviewed the triage vital signs and the nursing notes.  Pertinent labs & imaging results that were available during my care of the patient were reviewed by me and considered in my medical decision making (see chart for details).        63 year old male presents to the emergency department for psychiatric evaluation.  He reports suicidal ideations with potential plan of suicide by cop.  He is intoxicated with alcohol of 234 in the ED.  Low bicarb likely secondary to metabolization of alcohol.  The patient has been hemodynamically stable.  Pending TTS recommendations.  Disposition to be determined by oncoming ED provider.   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

## 2019-04-10 NOTE — Progress Notes (Signed)
Adult Psychoeducational Group Note  Date:  04/10/2019 Time:  11:11 PM  Group Topic/Focus:  Wrap-Up Group:   The focus of this group is to help patients review their daily goal of treatment and discuss progress on daily workbooks.  Participation Level:  Active  Participation Quality:  Appropriate  Affect:  Appropriate  Cognitive:  Appropriate  Insight: Appropriate  Engagement in Group:  Engaged  Modes of Intervention:  Discussion  Additional Comments:  Patient attended group and participated.  Shakur Lembo W Cleatus Gabriel 9/53/9672, 11:11 PM

## 2019-04-10 NOTE — Progress Notes (Signed)
Patient ID: Tim [name], male   DOB: 1956/03/15, 63 y.o.   MRN: [id number]  Admission Note  D) Patient admitted to the adult unit 300 hall voluntarily at 1700. Patient is a 63 year old male from [place]. Patient presents calm, appropriate and was pleasant and cooperative during the admission process. Patient states he has relapsed on drinking. Patient also endorses occasional marijuana use and tobacco use but denies other drug use. Patient denies SI/HI/AVH now but reportedly told the ED he was suicidal to die by the police. Patient minimizes this. Patient states he has been here before and has no concerns. Patient is not having significant withdrawal symptoms at this time.  Skin assessment was completed and unremarkable. Patient belongings searched with no contraband found. Belongings secured in locker. Vital signs obtained and were WNL. Snacks and fluids offered.    A) Plan of care, unit policies and patient expectations were explained. Written consents obtained. Patient oriented to the unit and their room. Patient placed on standard q15 safety checks. High fall risk precautions initiated and reviewed with patient.   R) Patient is in no acute distress and verbalizes understanding of information provided. Patient with no concerns at this time. Patient contracts for safety with staff on the unit. Report given to receiving RN.

## 2019-04-11 DIAGNOSIS — F322 Major depressive disorder, single episode, severe without psychotic features: Secondary | ICD-10-CM

## 2019-04-11 MED ORDER — CHLORDIAZEPOXIDE HCL 25 MG PO CAPS
25.0000 mg | ORAL_CAPSULE | Freq: Three times a day (TID) | ORAL | Status: AC
  Administered 2019-04-12 – 2019-04-13 (×3): 25 mg via ORAL
  Filled 2019-04-11 (×3): qty 1

## 2019-04-11 MED ORDER — CHLORDIAZEPOXIDE HCL 25 MG PO CAPS
25.0000 mg | ORAL_CAPSULE | Freq: Four times a day (QID) | ORAL | Status: AC | PRN
  Administered 2019-04-13: 25 mg via ORAL
  Filled 2019-04-11: qty 1

## 2019-04-11 MED ORDER — CHLORDIAZEPOXIDE HCL 25 MG PO CAPS
25.0000 mg | ORAL_CAPSULE | Freq: Every day | ORAL | Status: AC
  Administered 2019-04-15: 25 mg via ORAL
  Filled 2019-04-11: qty 1

## 2019-04-11 MED ORDER — CHLORDIAZEPOXIDE HCL 25 MG PO CAPS
25.0000 mg | ORAL_CAPSULE | ORAL | Status: AC
  Administered 2019-04-13 – 2019-04-14 (×2): 25 mg via ORAL
  Filled 2019-04-11 (×2): qty 1

## 2019-04-11 MED ORDER — CHLORDIAZEPOXIDE HCL 25 MG PO CAPS
25.0000 mg | ORAL_CAPSULE | Freq: Four times a day (QID) | ORAL | Status: AC
  Administered 2019-04-11 – 2019-04-12 (×4): 25 mg via ORAL
  Filled 2019-04-11 (×4): qty 1

## 2019-04-11 NOTE — Progress Notes (Signed)
D:  Patient visible in the milieu and is calm and cooperative upon approach.  He denies any thoughts of hurting himself or others and contracts for safety on the unit.  He does reports some withdrawal symptoms of sweating/feelings of hot and cold in addition to increased anxiety.  A:  Medications administered as ordered.  Safety checks q 15 minutes. Emotional support provided.  R:  Safety maintained on unit.   [place] NOVEL CORONAVIRUS (COVID-19) DAILY CHECK-OFF SYMPTOMS - answer yes or no to each - every day NO YES  Have you had a fever in the past 24 hours?  . Fever (Temp > 37.80C / 100F) X   Have you had any of these symptoms in the past 24 hours? . New Cough .  Sore Throat  .  Shortness of Breath .  Difficulty Breathing .  Unexplained Body Aches   X   Have you had any one of these symptoms in the past 24 hours not related to allergies?   . Runny Nose .  Nasal Congestion .  Sneezing   X   If you have had runny nose, nasal congestion, sneezing in the past 24 hours, has it worsened?  X   EXPOSURES - check yes or no X   Have you traveled outside the state in the past 14 days?  X   Have you been in contact with someone with a confirmed diagnosis of COVID-19 or PUI in the past 14 days without wearing appropriate PPE?  X   Have you been living in the same home as a person with confirmed diagnosis of COVID-19 or a PUI (household contact)?    X   Have you been diagnosed with COVID-19?    X              What to do next: Answered NO to all: Answered YES to anything:   Proceed with unit schedule Follow the [facility] Inpatient Flowsheet.

## 2019-04-11 NOTE — [facility] Suicide Risk Assessment (Signed)
[place] INPATIENT:  Family/Significant Other Suicide Prevention Education  Suicide Prevention Education:  Patient Refusal for Family/Significant Other Suicide Prevention Education: The patient Tim [name] has refused to provide written consent for family/significant other to be provided Family/Significant Other Suicide Prevention Education during admission and/or prior to discharge.  Physician notified.  Joellen Jersey 04/11/2019, 10:26 AM
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
## 2019-04-11 NOTE — Plan of Care (Signed)
  Problem: Education: Goal: Knowledge of [facility] Education information/materials will improve Outcome: Progressing Goal: Emotional status will improve Outcome: Progressing Goal: Mental status will improve Outcome: Progressing Goal: Verbalization of understanding the information provided will improve Outcome: Progressing   Problem: Activity: Goal: Interest or engagement in activities will improve Outcome: Progressing   

## 2019-04-11 NOTE — Progress Notes (Signed)
[facility] Group Notes:  (Nursing/MHT/Case Management/Adjunct)  Date:  04/11/2019  Time:  2030  Type of Therapy:  wrap up group  Participation Level:  Active  Participation Quality:  Appropriate, Attentive, Sharing and Supportive  Affect:  Appropriate  Cognitive:  Appropriate  Insight:  Improving  Engagement in Group:  Engaged  Modes of Intervention:  Clarification, Education and Support  Summary of Progress/Problems:  Shellia Cleverly 04/11/2019, 10:21 PM

## 2019-04-11 NOTE — Progress Notes (Signed)
D: Pt was in dayroom upon initial approach.  Pt presents with appropriate affect and mood.  He reports his day was "very well."  His goal is to "get good rest, take the next day as it comes."  Pt denies SI/HI, denies hallucinations, denies pain.  Pt has been visible in milieu interacting with peers and staff appropriately.  Pt attended evening group.    A: Introduced self to pt.  Actively listened to pt and offered support and encouragement. Medication administered per order.  PRN medication administered for pain and sleep.  15 minute safety checks in place.  R: Pt is safe on the unit.  Pt is compliant with medications.  Pt verbally contracts for safety.  Will continue to monitor and assess.

## 2019-04-11 NOTE — [facility] Suicide Risk Assessment (Signed)
[facility] Admission Suicide Risk Assessment   Nursing information obtained from:  Patient, Review of record Demographic factors:  Male, Low socioeconomic status Current Mental Status:  Suicidal ideation indicated by patient, Suicide plan Loss Factors:  Decline in physical health Historical Factors:  Impulsivity Risk Reduction Factors:  Living with another person, especially a relative  Total Time spent with patient: 45 minutes Principal Problem: Alcohol dependence/relapse/acute suicidal thinking when impaired Diagnosis:  Active Problems:   MDD (major depressive disorder), severe (HCC)  Subjective Data: Patient denies current suicidal thoughts states he only has those thoughts and statements when he is intoxicated.  Continued Clinical Symptoms:  Alcohol Use Disorder Identification Test Final Score (AUDIT): 19 The "Alcohol Use Disorders Identification Test", Guidelines for Use in Primary Care, Second Edition.  World Pharmacologist [facility]). Score between 0-7:  no or low risk or alcohol related problems. Score between 8-15:  moderate risk of alcohol related problems. Score between 16-19:  high risk of alcohol related problems. Score 20 or above:  warrants further diagnostic evaluation for alcohol dependence and treatment.   CLINICAL FACTORS:   Alcohol/Substance Abuse/Dependencies   Musculoskeletal: Strength & Muscle Tone: within normal limits Gait & Station: normal Patient leans: N/A  Psychiatric Specialty Exam: Physical Exam  Nursing note and vitals reviewed.   Review of Systems  Constitutional: Negative.   Eyes: Negative.   Cardiovascular: Negative.   Genitourinary: Negative.   Neurological: Negative.   Endo/Heme/Allergies: Negative.     Blood pressure (!) 126/94, pulse 74, temperature 98.1 F (36.7 C), temperature source Oral, resp. rate 18, height 6\' 3"  (1.905 m), weight 82.6 kg, SpO2 94 %.Body mass index is 22.75 kg/m.  General Appearance: Casual  Eye Contact:   Minimal  Speech:  Normal Rate  Volume:  Decreased  Mood:  Dysphoric  Affect:  Appropriate and Congruent  Thought Process:  Coherent and Descriptions of Associations: Intact  Orientation:  Full (Time, Place, and Person)  Thought Content:  Logical  Suicidal Thoughts:  No  Homicidal Thoughts:  No  Memory:  Immediate;   Fair  Judgement:  Fair  Insight:  Fair  Psychomotor Activity:  Normal  Concentration:  Concentration: Fair  Recall:  [employer] of Knowledge:  Fair  Language:  Fair  Akathisia:  Negative  Handed:  Right  AIMS (if indicated):     Assets:  Communication Skills Housing Physical Health Resilience  ADL's:  Intact  Cognition:  WNL  Sleep:  Number of Hours: 6.5      COGNITIVE FEATURES THAT CONTRIBUTE TO RISK:  None    SUICIDE RISK:   Minimal: No identifiable suicidal ideation.  Patients presenting with no risk factors but with morbid ruminations; may be classified as minimal risk based on the severity of the depressive symptoms  PLAN OF CARE: Detox underway  I certify that inpatient services furnished can reasonably be expected to improve the patient's condition.   Johnn Hai, MD 04/11/2019, 11:23 AM

## 2019-04-12 NOTE — Tx Team (Signed)
Interdisciplinary Treatment and Diagnostic Plan Update  04/12/2019 Time of Session: 9:00am Tim [name] MRN: [id number]  Principal Diagnosis: <principal problem not specified>  Secondary Diagnoses: Active Problems:   MDD (major depressive disorder), severe (HCC)   Current Medications:  Current Facility-Administered Medications  Medication Dose Route Frequency Provider Last Rate Last Dose  . chlordiazePOXIDE (LIBRIUM) capsule 25 mg  25 mg Oral TID Johnn Hai, MD   25 mg at 04/12/19 1159   Followed by  . [START ON 04/13/2019] chlordiazePOXIDE (LIBRIUM) capsule 25 mg  25 mg Oral Celine Ahr, MD       Followed by  . [START ON 04/15/2019] chlordiazePOXIDE (LIBRIUM) capsule 25 mg  25 mg Oral Daily Johnn Hai, MD      . chlordiazePOXIDE (LIBRIUM) capsule 25 mg  25 mg Oral Q6H PRN Johnn Hai, MD      . escitalopram (LEXAPRO) tablet 20 mg  20 mg Oral Daily Rankin, Shuvon B, NP   20 mg at 04/12/19 0749  . folic acid (FOLVITE) tablet 1 mg  1 mg Oral Daily Rankin, Shuvon B, NP   1 mg at 04/12/19 0749  . ibuprofen (ADVIL) tablet 600 mg  600 mg Oral Q8H PRN Rankin, Shuvon B, NP   600 mg at 04/11/19 2105  . nicotine (NICODERM CQ - dosed in mg/24 hours) patch 21 mg  21 mg Transdermal Daily Rankin, Shuvon B, NP   21 mg at 04/12/19 0749  . thiamine (VITAMIN B-1) tablet 100 mg  100 mg Oral Daily Rankin, Shuvon B, NP   100 mg at 04/12/19 0749   Or  . thiamine (B-1) injection 100 mg  100 mg Intravenous Daily Rankin, Shuvon B, NP      . traZODone (DESYREL) tablet 50 mg  50 mg Oral BID BM & HS PRN Rankin, Shuvon B, NP   50 mg at 04/11/19 2105   PTA Medications: Medications Prior to Admission  Medication Sig Dispense Refill Last Dose  . aspirin 325 MG EC tablet Take 325 mg by mouth daily as needed for pain.     Marland Kitchen escitalopram (LEXAPRO) 10 MG tablet Take 1 tablet (10 mg total) by mouth daily. 30 tablet 0   . gabapentin (NEURONTIN) 300 MG capsule Take 1 capsule (300 mg total) by mouth 3  (three) times daily. 90 capsule 0   . hydrOXYzine (ATARAX/VISTARIL) 25 MG tablet Take 25 mg by mouth every 6 (six) hours as needed for anxiety.     . risperiDONE (RISPERDAL) 0.25 MG tablet Take 0.25 mg by mouth 2 (two) times daily.     . traZODone (DESYREL) 100 MG tablet Take 1 tablet (100 mg total) at bedtime by mouth. For sleep (Patient taking differently: Take 100 mg by mouth at bedtime as needed for sleep. ) 30 tablet 0     Patient Stressors: Substance abuse  Patient Strengths: Average or above average intelligence Capable of independent living General fund of knowledge  Treatment Modalities: Medication Management, Group therapy, Case management,  1 to 1 session with clinician, Psychoeducation, Recreational therapy.   Physician Treatment Plan for Primary Diagnosis: <principal problem not specified> Long Term Goal(s): Improvement in symptoms so as ready for discharge Improvement in symptoms so as ready for discharge   Short Term Goals: Ability to disclose and discuss suicidal ideas Ability to demonstrate self-control will improve Ability to identify and develop effective coping behaviors will improve Ability to maintain clinical measurements within normal limits will improve Ability to disclose and discuss suicidal ideas  Ability to demonstrate self-control will improve Ability to identify and develop effective coping behaviors will improve  Medication Management: Evaluate patient's response, side effects, and tolerance of medication regimen.  Therapeutic Interventions: 1 to 1 sessions, Unit Group sessions and Medication administration.  Evaluation of Outcomes: Progressing  Physician Treatment Plan for Secondary Diagnosis: Active Problems:   MDD (major depressive disorder), severe (HCC)  Long Term Goal(s): Improvement in symptoms so as ready for discharge Improvement in symptoms so as ready for discharge   Short Term Goals: Ability to disclose and discuss suicidal  ideas Ability to demonstrate self-control will improve Ability to identify and develop effective coping behaviors will improve Ability to maintain clinical measurements within normal limits will improve Ability to disclose and discuss suicidal ideas Ability to demonstrate self-control will improve Ability to identify and develop effective coping behaviors will improve     Medication Management: Evaluate patient's response, side effects, and tolerance of medication regimen.  Therapeutic Interventions: 1 to 1 sessions, Unit Group sessions and Medication administration.  Evaluation of Outcomes: Progressing   RN Treatment Plan for Primary Diagnosis: <principal problem not specified> Long Term Goal(s): Knowledge of disease and therapeutic regimen to maintain health will improve  Short Term Goals: Ability to identify and develop effective coping behaviors will improve and Compliance with prescribed medications will improve  Medication Management: RN will administer medications as ordered by provider, will assess and evaluate patient's response and provide education to patient for prescribed medication. RN will report any adverse and/or side effects to prescribing provider.  Therapeutic Interventions: 1 on 1 counseling sessions, Psychoeducation, Medication administration, Evaluate responses to treatment, Monitor vital signs and CBGs as ordered, Perform/monitor CIWA, COWS, AIMS and Fall Risk screenings as ordered, Perform wound care treatments as ordered.  Evaluation of Outcomes: Progressing   LCSW Treatment Plan for Primary Diagnosis: <principal problem not specified> Long Term Goal(s): Safe transition to appropriate next level of care at discharge, Engage patient in therapeutic group addressing interpersonal concerns.  Short Term Goals: Engage patient in aftercare planning with referrals and resources, Identify triggers associated with mental health/substance abuse issues and Increase skills  for wellness and recovery  Therapeutic Interventions: Assess for all discharge needs, 1 to 1 time with Social worker, Explore available resources and support systems, Assess for adequacy in community support network, Educate family and significant other(s) on suicide prevention, Complete Psychosocial Assessment, Interpersonal group therapy.  Evaluation of Outcomes: Progressing   Progress in Treatment: Attending groups: Yes. Participating in groups: Yes. Taking medication as prescribed: Yes. Toleration medication: Yes. Family/Significant other contact made: No, will contact:  patient declines consents. SPE reviewed with patient Patient understands diagnosis: Yes. Discussing patient identified problems/goals with staff: Yes. Medical problems stabilized or resolved: No. Denies suicidal/homicidal ideation: Yes. Issues/concerns per patient self-inventory: No.  New problem(s) identified: Yes, Describe:  no income, no ID  New Short Term/Long Term Goal(s): detox, medication management for mood stabilization; elimination of SI thoughts; development of comprehensive mental wellness/sobriety plan.  Patient Goals:    Discharge Plan or Barriers: return home, [facility]  Reason for Continuation of Hospitalization: Anxiety Depression Medication stabilization Withdrawal symptoms  Estimated Length of Stay: 1-3 days  Attendees: Patient: 04/12/2019 12:47 PM  Physician:  04/12/2019 12:47 PM  Nursing:  04/12/2019 12:47 PM  RN Care Manager: 04/12/2019 12:47 PM  Social Worker: Enid Cutterharlotte Sheikh Leverich, Theresia MajorsLCSWA 04/12/2019 12:47 PM  Recreational Therapist:  04/12/2019 12:47 PM  Other:  04/12/2019 12:47 PM  Other:  04/12/2019 12:47 PM  Other: 04/12/2019  12:47 PM    Scribe for Treatment Team: Darreld McleanCharlotte C Britany Callicott, Theresia MajorsLCSWA 04/12/2019 12:47 PM

## 2019-04-12 NOTE — Progress Notes (Signed)
[facility]  04/12/2019 11:18 AM Delmar LandauJeffrey L Volkman  MRN:  [id number] Subjective:   Patient reports minimal withdrawal symptoms no cravings today would like to stay through the weekend though to reduce the risk of relapse, complete his detox regimen, and feel more reassured upon discharge.  No change in meds requested though Principal Problem: Relapsed with  alcoholism Diagnosis: Active Problems:   MDD (major depressive disorder), severe (HCC)  Total Time spent with patient: 20 minutes   Past Medical History:  Past Medical History:  Diagnosis Date  . Alcoholism (HCC)   . Anxiety   . COPD (chronic obstructive pulmonary disease) (HCC)   . Depression   . Hep C w/o coma, chronic (HCC)   . History of hiatal hernia    hernia rt groin  . Mental health disorder   . Scoliosis   . Thyroid disease     Past Surgical History:  Procedure Laterality Date  . SKIN CANCER EXCISION    . THYROID SURGERY     Family History:  Family History  Problem Relation Age of Onset  . Dementia Mother    Family Psychiatric  History: neg Social History:  Social History   Substance and Sexual Activity  Alcohol Use Yes   Comment: drank 1/5 today.     Social History   Substance and Sexual Activity  Drug Use Yes  . Types: Cocaine, Marijuana   Comment: also states will take pain pills every now and then, last use of cocaine and ETOH today at 1400    Social History   Socioeconomic History  . Marital status: Single    Spouse name: Not on file  . Number of children: Not on file  . Years of education: Not on file  . Highest education level: Not on file  Occupational History  . Not on file  Social Needs  . Financial resource strain: Not on file  . Food insecurity    Worry: Not on file    Inability: Not on file  . Transportation needs    Medical: Not on file    Non-medical: Not on file  Tobacco Use  . Smoking status: Current Every Day Smoker    Packs/day: 1.00    Years: 40.00    Pack  years: 40.00    Types: Cigarettes  . Smokeless tobacco: Never Used  Substance and Sexual Activity  . Alcohol use: Yes    Comment: drank 1/5 today.  . Drug use: Yes    Types: Cocaine, Marijuana    Comment: also states will take pain pills every now and then, last use of cocaine and ETOH today at 1400  . Sexual activity: Never    Birth control/protection: None  Lifestyle  . Physical activity    Days per week: Not on file    Minutes per session: Not on file  . Stress: Not on file  Relationships  . Social Musicianconnections    Talks on phone: Not on file    Gets together: Not on file    Attends religious service: Not on file    Active member of club or organization: Not on file    Attends meetings of clubs or organizations: Not on file    Relationship status: Not on file  Other Topics Concern  . Not on file  Social History Narrative   ** Merged History Encounter **       Additional Social History:  Sleep: Good  Appetite:  Good  Current Medications: Current Facility-Administered Medications  Medication Dose Route Frequency Provider Last Rate Last Dose  . chlordiazePOXIDE (LIBRIUM) capsule 25 mg  25 mg Oral TID Malvin JohnsFarah, Saniah Schroeter, MD       Followed by  . [START ON 04/13/2019] chlordiazePOXIDE (LIBRIUM) capsule 25 mg  25 mg Oral Nicki GuadalajaraBH-qamhs Steward Sames, MD       Followed by  . [START ON 04/15/2019] chlordiazePOXIDE (LIBRIUM) capsule 25 mg  25 mg Oral Daily Malvin JohnsFarah, Slayton Lubitz, MD      . chlordiazePOXIDE (LIBRIUM) capsule 25 mg  25 mg Oral Q6H PRN Malvin JohnsFarah, Kearia Yin, MD      . escitalopram (LEXAPRO) tablet 20 mg  20 mg Oral Daily Rankin, Shuvon B, NP   20 mg at 04/12/19 0749  . folic acid (FOLVITE) tablet 1 mg  1 mg Oral Daily Rankin, Shuvon B, NP   1 mg at 04/12/19 0749  . ibuprofen (ADVIL) tablet 600 mg  600 mg Oral Q8H PRN Rankin, Shuvon B, NP   600 mg at 04/11/19 2105  . nicotine (NICODERM CQ - dosed in mg/24 hours) patch 21 mg  21 mg Transdermal Daily Rankin, Shuvon  B, NP   21 mg at 04/12/19 0749  . thiamine (VITAMIN B-1) tablet 100 mg  100 mg Oral Daily Rankin, Shuvon B, NP   100 mg at 04/12/19 0749   Or  . thiamine (B-1) injection 100 mg  100 mg Intravenous Daily Rankin, Shuvon B, NP      . traZODone (DESYREL) tablet 50 mg  50 mg Oral BID BM & HS PRN Rankin, Shuvon B, NP   50 mg at 04/11/19 2105    Lab Results:  Results for orders placed or performed during the hospital encounter of 04/09/19 (from the past 48 hour(s))  SARS Coronavirus 2 (CEPHEID - Performed [facility] lab), Hosp Order     Status: None   Collection Time: 04/10/19  3:44 PM   Specimen: Nasopharyngeal Swab  Result Value Ref Range   SARS Coronavirus 2 NEGATIVE NEGATIVE    Comment: (NOTE) If result is NEGATIVE SARS-CoV-2 target nucleic acids are NOT DETECTED. The SARS-CoV-2 RNA is generally detectable in upper and lower  respiratory specimens during the acute phase of infection. The lowest  concentration of SARS-CoV-2 viral copies this assay can detect is 250  copies / mL. A negative result does not preclude SARS-CoV-2 infection  and should not be used as the sole basis for treatment or other  patient management decisions.  A negative result may occur with  improper specimen collection / handling, submission of specimen other  than nasopharyngeal swab, presence of viral mutation(s) within the  areas targeted by this assay, and inadequate number of viral copies  (<250 copies / mL). A negative result must be combined with clinical  observations, patient history, and epidemiological information. If result is POSITIVE SARS-CoV-2 target nucleic acids are DETECTED. The SARS-CoV-2 RNA is generally detectable in upper and lower  respiratory specimens dur ing the acute phase of infection.  Positive  results are indicative of active infection with SARS-CoV-2.  Clinical  correlation with patient history and other diagnostic information is  necessary to determine patient  infection status.  Positive results do  not rule out bacterial infection or co-infection with other viruses. If result is PRESUMPTIVE POSTIVE SARS-CoV-2 nucleic acids MAY BE PRESENT.   A presumptive positive result was obtained on the submitted specimen  and confirmed on repeat testing.  While 2019 novel coronavirus  (SARS-CoV-2) nucleic acids may be present in the submitted sample  additional confirmatory testing may be necessary for epidemiological  and / or clinical management purposes  to differentiate between  SARS-CoV-2 and other Sarbecovirus currently known to infect humans.  If clinically indicated additional testing with an alternate test  methodology [id number]) is advised. The SARS-CoV-2 RNA is generally  detectable in upper and lower respiratory sp ecimens during the acute  phase of infection. The expected result is Negative. Fact Sheet for Patients:  StrictlyIdeas.no Fact Sheet for Healthcare Providers: BankingDealers.co.za This test is not yet approved or cleared by the [place] FDA and has been authorized for detection and/or diagnosis of SARS-CoV-2 by FDA under an Emergency Use Authorization (EUA).  This EUA will remain in effect (meaning this test can be used) for the duration of the COVID-19 declaration under Section 564(b)(1) of the Act, 21 U.S.C. section 360bbb-3(b)(1), unless the authorization is terminated or revoked sooner. Performed at [facility], [place] [address] [place], [place] [address]     Blood Alcohol level:  Lab Results  Component Value Date   ETH 234 (H) 04/09/2019   ETH 162 (H) 76/19/5093    Metabolic Disorder Labs: Lab Results  Component Value Date   HGBA1C 5.4 08/22/2017   MPG 108.28 08/22/2017   No results found for: PROLACTIN Lab Results  Component Value Date   CHOL 209 (H) 08/22/2017   TRIG 140 08/22/2017   HDL 57 08/22/2017   CHOLHDL 3.7 08/22/2017   VLDL 28  08/22/2017   LDLCALC 124 (H) 08/22/2017    Physical Findings: AIMS: Facial and Oral Movements Muscles of Facial Expression: None, normal Lips and Perioral Area: None, normal Jaw: None, normal Tongue: None, normal,Extremity Movements Upper (arms, wrists, hands, fingers): None, normal Lower (legs, knees, ankles, toes): None, normal, Trunk Movements Neck, shoulders, hips: None, normal, Overall Severity Severity of abnormal movements (highest score from questions above): None, normal Incapacitation due to abnormal movements: None, normal Patient's awareness of abnormal movements (rate only patient's report): No Awareness, Dental Status Current problems with teeth and/or dentures?: No Does patient usually wear dentures?: No  CIWA:  CIWA-Ar Total: 3 COWS:     Musculoskeletal: Strength & Muscle Tone: within normal limits Gait & Station: normal Patient leans: N/A  Psychiatric Specialty Exam: Physical Exam  ROS  Blood pressure 114/85, pulse 74, temperature 97.7 F (36.5 C), temperature source Oral, resp. rate 16, height 6\' 3"  (1.905 m), weight 82.6 kg, SpO2 97 %.Body mass index is 22.75 kg/m.  General Appearance: Casual  Eye Contact:  Good  Speech:  Clear and Coherent  Volume:  Normal  Mood:  Anxious and Dysphoric  Affect:  Appropriate and Congruent  Thought Process:  Goal Directed  Orientation:  Full (Time, Place, and Person)  Thought Content:  Logical  Suicidal Thoughts:  No  Homicidal Thoughts:  No  Memory:  Immediate;   Fair  Judgement:  Fair  Insight:  Fair  Psychomotor Activity:  Normal  Concentration:  Concentration: Fair  Recall:  [place] of Knowledge:  Good  Language:  Good  Akathisia:  Negative  Handed:  Right  AIMS (if indicated):     Assets:  Physical Health Resilience  ADL's:  Intact  Cognition:  WNL  Sleep:  Number of Hours: 5     Treatment Plan Summary: Daily contact with patient to assess and evaluate symptoms and progress in treatment,  Medication management and Plan Continue detox measures  and current cognitive therapy probable discharge Monday  Malvin JohnsFARAH,Edmund Holcomb, MD 04/12/2019, 11:18 AM

## 2019-04-12 NOTE — Progress Notes (Signed)
Patient ID: Tim [name], male   DOB: 1956-02-19, 63 y.o.   MRN: [id number]  D: Patient is seen visible on the unit. Patient appears to have only mild withdrawal symptoms controlled by scheduled and PRN medications. Patient denies SI/HI/AVH. Patient denies concerns today. A: Safety maintained with q15 minute checks. Medications provided as ordered. High fall risk precautions in place.  R: Patient remains safe on the unit at this time.

## 2019-04-13 NOTE — Progress Notes (Signed)
[facility] Group Notes:  (Nursing/MHT/Case Management/Adjunct)  Date:  04/13/2019  Time:  2030  Type of Therapy:  wrap up group  Participation Level:  Active  Participation Quality:  Appropriate, Attentive, Sharing and Supportive  Affect:  Appropriate  Cognitive:  Appropriate  Insight:  Improving  Engagement in Group:  Engaged  Modes of Intervention:  Clarification, Education and Support  Summary of Progress/Problems:  Shellia Cleverly 04/13/2019, 9:20 PM

## 2019-04-13 NOTE — Progress Notes (Addendum)
[facility]  04/13/2019 1:26 PM Delmar LandauJeffrey L Blumer  MRN:  [id number] Subjective:  "I'm ok."  Mr. Rennis Hardingllis found sitting in the dayroom. He reports "I've still got a little edge" related to detox symptoms. He reports some irritability, diarrhea. He reports stable mood. Denies SI during periods of sobriety. He has been active in milieu, interacting appropriately with others. Denies medication side effects.  From admission H&P: This is a repeat admission for Mr. Rennis Hardingllis is 63 year old patient who states that he had relapsed on alcohol, and requires detox measures he further states that he had "gotten with the wrong crowd" that his friends who were daily drinkers, normally his intake is more modest just 2 or 4 beers per day but he had escalated his intake recently to a case of beer and therefore feels he needs to "stop it while he can" achieve detox.    Principal Problem: <principal problem not specified> Diagnosis: Active Problems:   MDD (major depressive disorder), severe (HCC)  Total Time spent with patient: 15 minutes  Past Psychiatric History: See admission H&P  Past Medical History:  Past Medical History:  Diagnosis Date  . Alcoholism (HCC)   . Anxiety   . COPD (chronic obstructive pulmonary disease) (HCC)   . Depression   . Hep C w/o coma, chronic (HCC)   . History of hiatal hernia    hernia rt groin  . Mental health disorder   . Scoliosis   . Thyroid disease     Past Surgical History:  Procedure Laterality Date  . SKIN CANCER EXCISION    . THYROID SURGERY     Family History:  Family History  Problem Relation Age of Onset  . Dementia Mother    Family Psychiatric  History: See admission H&P Social History:  Social History   Substance and Sexual Activity  Alcohol Use Yes   Comment: drank 1/5 today.     Social History   Substance and Sexual Activity  Drug Use Yes  . Types: Cocaine, Marijuana   Comment: also states will take pain pills every now and then, last  use of cocaine and ETOH today at 1400    Social History   Socioeconomic History  . Marital status: Single    Spouse name: Not on file  . Number of children: Not on file  . Years of education: Not on file  . Highest education level: Not on file  Occupational History  . Not on file  Social Needs  . Financial resource strain: Not on file  . Food insecurity    Worry: Not on file    Inability: Not on file  . Transportation needs    Medical: Not on file    Non-medical: Not on file  Tobacco Use  . Smoking status: Current Every Day Smoker    Packs/day: 1.00    Years: 40.00    Pack years: 40.00    Types: Cigarettes  . Smokeless tobacco: Never Used  Substance and Sexual Activity  . Alcohol use: Yes    Comment: drank 1/5 today.  . Drug use: Yes    Types: Cocaine, Marijuana    Comment: also states will take pain pills every now and then, last use of cocaine and ETOH today at 1400  . Sexual activity: Never    Birth control/protection: None  Lifestyle  . Physical activity    Days per week: Not on file    Minutes per session: Not on file  . Stress: Not on  file  Relationships  . Social Herbalist on phone: Not on file    Gets together: Not on file    Attends religious service: Not on file    Active member of club or organization: Not on file    Attends meetings of clubs or organizations: Not on file    Relationship status: Not on file  Other Topics Concern  . Not on file  Social History Narrative   ** Merged History Encounter **       Additional Social History:                         Sleep: Good  Appetite:  Good  Current Medications: Current Facility-Administered Medications  Medication Dose Route Frequency Provider Last Rate Last Dose  . chlordiazePOXIDE (LIBRIUM) capsule 25 mg  25 mg Oral Celine Ahr, MD       Followed by  . [START ON 04/15/2019] chlordiazePOXIDE (LIBRIUM) capsule 25 mg  25 mg Oral Daily Johnn Hai, MD      .  chlordiazePOXIDE (LIBRIUM) capsule 25 mg  25 mg Oral Q6H PRN Johnn Hai, MD      . escitalopram (LEXAPRO) tablet 20 mg  20 mg Oral Daily Rankin, Shuvon B, NP   20 mg at 04/13/19 0827  . folic acid (FOLVITE) tablet 1 mg  1 mg Oral Daily Rankin, Shuvon B, NP   1 mg at 04/13/19 0827  . ibuprofen (ADVIL) tablet 600 mg  600 mg Oral Q8H PRN Rankin, Shuvon B, NP   600 mg at 04/13/19 0827  . nicotine (NICODERM CQ - dosed in mg/24 hours) patch 21 mg  21 mg Transdermal Daily Rankin, Shuvon B, NP   21 mg at 04/13/19 0829  . thiamine (VITAMIN B-1) tablet 100 mg  100 mg Oral Daily Rankin, Shuvon B, NP   100 mg at 04/13/19 0827   Or  . thiamine (B-1) injection 100 mg  100 mg Intravenous Daily Rankin, Shuvon B, NP      . traZODone (DESYREL) tablet 50 mg  50 mg Oral BID BM & HS PRN Rankin, Shuvon B, NP   50 mg at 04/12/19 2153    Lab Results: No results found for this or any previous visit (from the past 48 hour(s)).  Blood Alcohol level:  Lab Results  Component Value Date   ETH 234 (H) 04/09/2019   ETH 162 (H) 31/51/7616    Metabolic Disorder Labs: Lab Results  Component Value Date   HGBA1C 5.4 08/22/2017   MPG 108.28 08/22/2017   No results found for: PROLACTIN Lab Results  Component Value Date   CHOL 209 (H) 08/22/2017   TRIG 140 08/22/2017   HDL 57 08/22/2017   CHOLHDL 3.7 08/22/2017   VLDL 28 08/22/2017   LDLCALC 124 (H) 08/22/2017    Physical Findings: AIMS: Facial and Oral Movements Muscles of Facial Expression: None, normal Lips and Perioral Area: None, normal Jaw: None, normal Tongue: None, normal,Extremity Movements Upper (arms, wrists, hands, fingers): None, normal Lower (legs, knees, ankles, toes): None, normal, Trunk Movements Neck, shoulders, hips: None, normal, Overall Severity Severity of abnormal movements (highest score from questions above): None, normal Incapacitation due to abnormal movements: None, normal Patient's awareness of abnormal movements (rate only  patient's report): No Awareness, Dental Status Current problems with teeth and/or dentures?: No Does patient usually wear dentures?: No  CIWA:  CIWA-Ar Total: 0 COWS:     Musculoskeletal: Strength &  Muscle Tone: within normal limits Gait & Station: normal Patient leans: N/A  Psychiatric Specialty Exam: Physical Exam  Nursing note and vitals reviewed. Constitutional: He is oriented to person, place, and time. He appears well-developed and well-nourished.  Cardiovascular: Normal rate.  Respiratory: Effort normal.  Neurological: He is alert and oriented to person, place, and time.    Review of Systems  Constitutional: Negative.   Respiratory: Negative for cough and shortness of breath.   Cardiovascular: Negative for chest pain.  Gastrointestinal: Positive for diarrhea. Negative for nausea and vomiting.  Psychiatric/Behavioral: Positive for substance abuse. Negative for depression, hallucinations and suicidal ideas. The patient is not nervous/anxious and does not have insomnia.     Blood pressure 104/73, pulse 75, temperature 97.9 F (36.6 C), temperature source Oral, resp. rate 16, height 6\' 3"  (1.905 m), weight 82.6 kg, SpO2 97 %.Body mass index is 22.75 kg/m.  General Appearance: Casual  Eye Contact:  Good  Speech:  Clear and Coherent  Volume:  Normal  Mood:  Euthymic  Affect:  Appropriate and Congruent  Thought Process:  Coherent  Orientation:  Full (Time, Place, and Person)  Thought Content:  Logical  Suicidal Thoughts:  No  Homicidal Thoughts:  No  Memory:  Immediate;   Fair Recent;   Fair  Judgement:  Intact  Insight:  Fair  Psychomotor Activity:  Normal  Concentration:  Concentration: Fair and Attention Span: Fair  Recall:  FiservFair  Fund of Knowledge:  Fair  Language:  Good  Akathisia:  No  Handed:  Right  AIMS (if indicated):     Assets:  Communication Skills Desire for Improvement Housing Resilience Social Support  ADL's:  Intact  Cognition:  WNL  Sleep:   Number of Hours: 6.75     Treatment Plan Summary: Daily contact with patient to assess and evaluate symptoms and progress in treatment and Medication management   Continue inpatient hospitalization.  Continue Librium CIWA protocol for ETOH withdrawal Continue Lexapro 20 mg PO daily for mood Continue trazodone 50 mg PO BID for mood Continue folic acid 1 mg Po daily for supplementation Continue thiamine 100 mg PO daily for supplementation  Patient will participate in the therapeutic group milieu.  Discharge disposition in progress.   Aldean BakerJanet E Sykes, NP 04/13/2019, 1:26 PM   Attest to NP Progress Note

## 2019-04-13 NOTE — Progress Notes (Signed)
[place] NOVEL CORONAVIRUS (COVID-19) DAILY CHECK-OFF SYMPTOMS - answer yes or no to each - every day NO YES  Have you had a fever in the past 24 hours?  . Fever (Temp > 37.80C / 100F) X   Have you had any of these symptoms in the past 24 hours? . New Cough .  Sore Throat  .  Shortness of Breath .  Difficulty Breathing .  Unexplained Body Aches   X   Have you had any one of these symptoms in the past 24 hours not related to allergies?   . Runny Nose .  Nasal Congestion .  Sneezing   X   If you have had runny nose, nasal congestion, sneezing in the past 24 hours, has it worsened?  X   EXPOSURES - check yes or no X   Have you traveled outside the state in the past 14 days?  X   Have you been in contact with someone with a confirmed diagnosis of COVID-19 or PUI in the past 14 days without wearing appropriate PPE?  X   Have you been living in the same home as a person with confirmed diagnosis of COVID-19 or a PUI (household contact)?    X   Have you been diagnosed with COVID-19?    X              What to do next: Answered NO to all: Answered YES to anything:   Proceed with unit schedule Follow the [facility] Inpatient Flowsheet.   

## 2019-04-13 NOTE — [facility] Group Notes (Signed)
[facility] Group Notes:  (Nursing/)  Date:  04/13/2019  Time:200 PM Type of Therapy:  Nurse Education  Participation Level:  Active  Participation Quality:  Attentive  Affect:  Appropriate  Cognitive:  Appropriate  Insight:  Appropriate  Engagement in Group:  Engaged  Modes of Intervention:  Discussion and Education  Summary of Progress/Problems: Group played a non competitive Retail banker game that fosters listening skills as well as self expression.  Waymond Cera 04/13/2019, 6:56 PM

## 2019-04-13 NOTE — Progress Notes (Signed)
D. Pt is friendly upon approach- observed in the milieu interacting well with peers. Per pt's self inventory, pt rated his depression, hopelessness and anxiety a 3/0/5, respectively. Pt writes that his goal today is "my personal growth" .Pt currently denies SI/HI and AVH  A. Labs and vitals monitored. Pt compliant with  Medications- Advil given per MD order for chronic bilateral knee pain, 5/10. Pt supported emotionally and encouraged to express concerns and ask questions.   R. Pt remains safe with 15 minute checks. Will continue POC.

## 2019-04-13 NOTE — Progress Notes (Signed)
D: Pt was in dayroom upon initial approach.  Pt presents with appropriate affect and mood.  He reports his day "started out pretty lame.  I was in bed most of the day.  I called my mom, she has dementia, and she didn't recognize me.  That depressed me a little."  He states the best part of his day was "talking with everybody in the dayroom, socializing."  Pt denies SI/HI, denies hallucinations, reports R knee pain of 5/10.  Pt has been visible in milieu interacting with peers and staff appropriately.    A: Introduced self to pt.  Met with pt 1:1.  Actively listened to pt and offered support and encouragement.  PRN medication administered for pain and sleep.  15 minute safety checks in place.  R: Pt is safe on the unit.  Pt is compliant with medications.  Pt verbally contracts for safety.  Will continue to monitor and assess.

## 2019-04-13 NOTE — [facility] Group Notes (Signed)
LCSW Group Therapy Note  04/13/2019   10:00-11:00am   Type of Therapy and Topic:  Group Therapy: Anger Cues and Responses  Participation Level:  Active   Description of Group:   In this group, patients learned how to recognize the physical, cognitive, emotional, and behavioral responses they have to anger-provoking situations.  They identified a recent time they became angry and how they reacted.  They analyzed how their reaction was possibly beneficial and how it was possibly unhelpful.  The group discussed a variety of healthier coping skills that could help with such a situation in the future.  Deep breathing was practiced briefly.  Therapeutic Goals: 1. Patients will remember their last incident of anger and how they felt emotionally and physically, what their thoughts were at the time, and how they behaved. 2. Patients will identify how their behavior at that time worked for them, as well as how it worked against them. 3. Patients will explore possible new behaviors to use in future anger situations. 4. Patients will learn that anger itself is normal and cannot be eliminated, and that healthier reactions can assist with resolving conflict rather than worsening situations.  Summary of Patient Progress:  The patient shared that his most recent time of anger was a drug deal gone bad and said his actions to rectify the situation. He recognizes that his reaction could gone badly and is open learning new skills.  Therapeutic Modalities:   Cognitive Behavioral Therapy  Rolanda Jay

## 2019-04-14 NOTE — Progress Notes (Signed)
[place] NOVEL CORONAVIRUS (COVID-19) DAILY CHECK-OFF SYMPTOMS - answer yes or no to each - every day NO YES  Have you had a fever in the past 24 hours?  . Fever (Temp > 37.80C / 100F) X   Have you had any of these symptoms in the past 24 hours? . New Cough .  Sore Throat  .  Shortness of Breath .  Difficulty Breathing .  Unexplained Body Aches   X   Have you had any one of these symptoms in the past 24 hours not related to allergies?   . Runny Nose .  Nasal Congestion .  Sneezing   X   If you have had runny nose, nasal congestion, sneezing in the past 24 hours, has it worsened?  X   EXPOSURES - check yes or no X   Have you traveled outside the state in the past 14 days?  X   Have you been in contact with someone with a confirmed diagnosis of COVID-19 or PUI in the past 14 days without wearing appropriate PPE?  X   Have you been living in the same home as a person with confirmed diagnosis of COVID-19 or a PUI (household contact)?    X   Have you been diagnosed with COVID-19?    X              What to do next: Answered NO to all: Answered YES to anything:   Proceed with unit schedule Follow the [facility] Inpatient Flowsheet.   

## 2019-04-14 NOTE — Progress Notes (Signed)
D: Pt denies SI/HI/AVH. Pt is pleasant and cooperative. Pt said he was better due to peers and groups A: Pt was offered support and encouragement. Pt was given scheduled medications. Pt was encourage to attend groups. Q 15 minute checks were done for safety.  R:Pt attends groups and interacts well with peers and staff. Pt is taking medication. Pt has no complaints.Pt receptive to treatment and safety maintained on unit.  Problem: Education: Goal: Emotional status will improve Outcome: Progressing   Problem: Education: Goal: Mental status will improve Outcome: Progressing

## 2019-04-14 NOTE — Progress Notes (Signed)
D: Pt denies SI/HI/AVH. Pt visible in the dayroom A: Pt was offered support and encouragement. Pt was given scheduled medications. Pt was encourage to attend groups. Q 15 minute checks were done for safety.  R:Pt attends groups and interacts well with peers and staff. Pt is taking medication.Pt receptive to treatment and safety maintained on unit.

## 2019-04-14 NOTE — Progress Notes (Signed)
D. Pt friendly and pleasant during interactions- observed interacting appropriately with peers and staff.  Pt currently denies SI/HI and AVH A. Labs and vitals monitored. Pt compliant with  medications. Given Advil PO for chronic bilateral knee pain 5/10 . Pt supported emotionally and encouraged to express concerns and ask questions.   R. Pt remains safe with 15 minute checks. Will continue POC.

## 2019-04-14 NOTE — [facility] Group Notes (Signed)
[facility] LCSW Group Therapy Note  Date/Time:  04/14/2019 9:00-10:00 or 10:00-11:00AM  Type of Therapy and Topic:  Group Therapy:  Healthy and Unhealthy Supports  Participation Level: Active   Description of Group:  Patients in this group were introduced to the idea of adding a variety of healthy supports to address the various needs in their lives.Patients discussed what additional healthy supports could be helpful in their recovery and wellness after discharge in order to prevent future hospitalizations.   An emphasis was placed on using counselor, doctor, therapy groups, 12-step groups, and problem-specific support groups to expand supports.  They also worked as a group on developing a specific plan for several patients to deal with unhealthy supports through [place], psychoeducation with loved ones, and even termination of relationships.   Therapeutic Goals:   1)  discuss importance of adding supports to stay well once out of the hospital  2)  compare healthy versus unhealthy supports and identify some examples of each  3)  generate ideas and descriptions of healthy supports that can be added  4)  offer mutual support about how to address unhealthy supports  5)  encourage active participation in and adherence to discharge plan    Summary of Patient Progress:  The patient stated that current healthy supports in his life are his family and his dog while current unhealthy supports include  Some of his old friends.  The patient expressed a willingness to add therapy and 12 step meetings as support(s) to help in his recovery journey.   Therapeutic Modalities:   Motivational Interviewing Brief Solution-Focused Therapy  Rolanda Jay

## 2019-04-14 NOTE — Progress Notes (Addendum)
[facility] MD Progress Note  04/14/2019 10:17 AM Tim [name]  MRN:  [id number] Subjective:  "I'm good."  Tim [name] found lying in bed. He appears more alert today with fuller range of affect and reporting stable mood. He reports continuing withdrawal symptoms but decreasing in severity. Mild tremor observed. Mild diarrhea reported. He reports good sleep and appetite and has been drinking fluids. He is interacting appropriately in the milieu. Denies SI/HI/AVH. Denies medication side effects.  From admission H&P: This is a repeat admission for Tim [name] is 63 year old patient who states that he had relapsed on alcohol, and requires detox measures he further states that he had "gotten with the wrong crowd" that his friends who were daily drinkers, normally his intake is more modest just 2 or 4 beers per day but he had escalated his intake recently to a case of beer and therefore feels he needs to "stop it while he can" achieve detox.   Principal Problem: <principal problem not specified> Diagnosis: Active Problems:   MDD (major depressive disorder), severe (HCC)  Total Time spent with patient: 15 minutes  Past Psychiatric History: See admission H&P  Past Medical History:  Past Medical History:  Diagnosis Date  . Alcoholism (HCC)   . Anxiety   . COPD (chronic obstructive pulmonary disease) (HCC)   . Depression   . Hep C w/o coma, chronic (HCC)   . History of hiatal hernia    hernia rt groin  . Mental health disorder   . Scoliosis   . Thyroid disease     Past Surgical History:  Procedure Laterality Date  . SKIN CANCER EXCISION    . THYROID SURGERY     Family History:  Family History  Problem Relation Age of Onset  . Dementia Mother    Family Psychiatric  History: See admission H&P Social History:  Social History   Substance and Sexual Activity  Alcohol Use Yes   Comment: drank 1/5 today.     Social History   Substance and Sexual Activity  Drug Use Yes  . Types: Cocaine,  Marijuana   Comment: also states will take pain pills every now and then, last use of cocaine and ETOH today at 1400    Social History   Socioeconomic History  . Marital status: Single    Spouse name: Not on file  . Number of children: Not on file  . Years of education: Not on file  . Highest education level: Not on file  Occupational History  . Not on file  Social Needs  . Financial resource strain: Not on file  . Food insecurity    Worry: Not on file    Inability: Not on file  . Transportation needs    Medical: Not on file    Non-medical: Not on file  Tobacco Use  . Smoking status: Current Every Day Smoker    Packs/day: 1.00    Years: 40.00    Pack years: 40.00    Types: Cigarettes  . Smokeless tobacco: Never Used  Substance and Sexual Activity  . Alcohol use: Yes    Comment: drank 1/5 today.  . Drug use: Yes    Types: Cocaine, Marijuana    Comment: also states will take pain pills every now and then, last use of cocaine and ETOH today at 1400  . Sexual activity: Never    Birth control/protection: None  Lifestyle  . Physical activity    Days per week: Not on file    Minutes  per session: Not on file  . Stress: Not on file  Relationships  . Social Musicianconnections    Talks on phone: Not on file    Gets together: Not on file    Attends religious service: Not on file    Active member of club or organization: Not on file    Attends meetings of clubs or organizations: Not on file    Relationship status: Not on file  Other Topics Concern  . Not on file  Social History Narrative   ** Merged History Encounter **       Additional Social History:                         Sleep: Good  Appetite:  Good  Current Medications: Current Facility-Administered Medications  Medication Dose Route Frequency Provider Last Rate Last Dose  . [START ON 04/15/2019] chlordiazePOXIDE (LIBRIUM) capsule 25 mg  25 mg Oral Daily Malvin JohnsFarah, Brian, MD      . chlordiazePOXIDE (LIBRIUM)  capsule 25 mg  25 mg Oral Q6H PRN Malvin JohnsFarah, Brian, MD   25 mg at 04/13/19 1759  . escitalopram (LEXAPRO) tablet 20 mg  20 mg Oral Daily Rankin, Shuvon B, NP   20 mg at 04/14/19 0826  . folic acid (FOLVITE) tablet 1 mg  1 mg Oral Daily Rankin, Shuvon B, NP   1 mg at 04/14/19 0826  . ibuprofen (ADVIL) tablet 600 mg  600 mg Oral Q8H PRN Rankin, Shuvon B, NP   600 mg at 04/14/19 0826  . nicotine (NICODERM CQ - dosed in mg/24 hours) patch 21 mg  21 mg Transdermal Daily Rankin, Shuvon B, NP   21 mg at 04/14/19 0826  . thiamine (VITAMIN B-1) tablet 100 mg  100 mg Oral Daily Rankin, Shuvon B, NP   100 mg at 04/14/19 [id number]   Or  . thiamine (B-1) injection 100 mg  100 mg Intravenous Daily Rankin, Shuvon B, NP      . traZODone (DESYREL) tablet 50 mg  50 mg Oral BID BM & HS PRN Rankin, Shuvon B, NP   50 mg at 04/13/19 2106    Lab Results: No results found for this or any previous visit (from the past 48 hour(s)).  Blood Alcohol level:  Lab Results  Component Value Date   ETH 234 (H) 04/09/2019   ETH 162 (H) 11/10/2018    Metabolic Disorder Labs: Lab Results  Component Value Date   HGBA1C 5.4 08/22/2017   MPG 108.28 08/22/2017   No results found for: PROLACTIN Lab Results  Component Value Date   CHOL 209 (H) 08/22/2017   TRIG 140 08/22/2017   HDL 57 08/22/2017   CHOLHDL 3.7 08/22/2017   VLDL 28 08/22/2017   LDLCALC 124 (H) 08/22/2017    Physical Findings: AIMS: Facial and Oral Movements Muscles of Facial Expression: None, normal Lips and Perioral Area: None, normal Jaw: None, normal Tongue: None, normal,Extremity Movements Upper (arms, wrists, hands, fingers): None, normal Lower (legs, knees, ankles, toes): None, normal, Trunk Movements Neck, shoulders, hips: None, normal, Overall Severity Severity of abnormal movements (highest score from questions above): None, normal Incapacitation due to abnormal movements: None, normal Patient's awareness of abnormal movements (rate only patient's  report): No Awareness, Dental Status Current problems with teeth and/or dentures?: No Does patient usually wear dentures?: No  CIWA:  CIWA-Ar Total: 0 COWS:     Musculoskeletal: Strength & Muscle Tone: within normal limits Gait & Station: normal Patient  leans: N/A  Psychiatric Specialty Exam: Physical Exam  Nursing note and vitals reviewed. Constitutional: He is oriented to person, place, and time. He appears well-developed and well-nourished.  Cardiovascular: Normal rate.  Respiratory: Effort normal.  Neurological: He is alert and oriented to person, place, and time.    Review of Systems  Constitutional: Negative.   Respiratory: Negative for cough and shortness of breath.   Cardiovascular: Negative for chest pain.  Gastrointestinal: Positive for diarrhea and nausea. Negative for abdominal pain and vomiting.  Neurological: Positive for tremors. Negative for dizziness, sensory change and headaches.  Psychiatric/Behavioral: Positive for substance abuse. Negative for depression, hallucinations and suicidal ideas. The patient is not nervous/anxious and does not have insomnia.     Blood pressure 101/74, pulse 74, temperature 97.8 F (36.6 C), temperature source Oral, resp. rate 16, height 6\' 3"  (1.905 m), weight 82.6 kg, SpO2 97 %.Body mass index is 22.75 kg/m.  General Appearance: Casual  Eye Contact:  Good  Speech:  Normal Rate  Volume:  Normal  Mood:  Euthymic  Affect:  Congruent  Thought Process:  Coherent  Orientation:  Full (Time, Place, and Person)  Thought Content:  Logical  Suicidal Thoughts:  No  Homicidal Thoughts:  No  Memory:  Immediate;   Fair Recent;   Fair  Judgement:  Intact  Insight:  Fair  Psychomotor Activity:  Normal  Concentration:  Concentration: Fair  Recall:  [employer] of Knowledge:  Fair  Language:  Good  Akathisia:  No  Handed:  Right  AIMS (if indicated):     Assets:  Communication Skills Desire for Improvement Housing Social Support   ADL's:  Intact  Cognition:  WNL  Sleep:  Number of Hours: 5     Treatment Plan Summary: Daily contact with patient to assess and evaluate symptoms and progress in treatment and Medication management   Continue inpatient hospitalization.  Continue Librium CIWA protocol for ETOH withdrawal Continue Lexapro 20 mg PO daily for mood Continue trazodone 50 mg PO BID for mood Continue folic acid 1 mg Po daily for supplementation Continue thiamine 100 mg PO daily for supplementation  Patient will participate in the therapeutic group milieu.  Discharge disposition in progress.   Connye Burkitt, NP 04/14/2019, 10:17 AM   Agree with NP Progress Note

## 2019-04-15 MED ORDER — ACAMPROSATE CALCIUM 333 MG PO TBEC
666.0000 mg | DELAYED_RELEASE_TABLET | Freq: Three times a day (TID) | ORAL | Status: DC
  Filled 2019-04-15 (×3): qty 42

## 2019-04-15 MED ORDER — ESCITALOPRAM OXALATE 20 MG PO TABS: 20.0000 mg | ORAL_TABLET | Freq: Every day | ORAL | 1 refills | Status: DC

## 2019-04-15 MED ORDER — ACAMPROSATE CALCIUM 333 MG PO TBEC: 666.0000 mg | DELAYED_RELEASE_TABLET | Freq: Three times a day (TID) | ORAL | 2 refills | Status: DC

## 2019-04-15 NOTE — Progress Notes (Signed)
  [facility] Adult Case Management Discharge Plan :  Will you be returning to the same living situation after discharge:  Yes,  with his mother At discharge, do you have transportation home?: Yes,  unknown plan Do you have the ability to pay for your medications: No. Will work with [employer].   Release of information consent forms completed and in the chart;  Patient's signature needed at discharge.  Patient to Follow up at: Follow-up Information    [facility] Follow up on 04/18/2019.   Why: Telephonic hospital follow up appointment is Tuesday, 7/2 at 4:15p.  The provider will contact you the day of the appointment.  Contact information: [place] [address] [phone]           Next level of care provider has access to [facility] and Suicide Prevention discussed: No.Pt declined consent.   Have you used any form of tobacco in the last 30 days? (Cigarettes, Smokeless Tobacco, Cigars, and/or Pipes): Yes  Has patient been referred to the Quitline?: Pt discharged before CSW could offer program.   Patient has been referred for addiction treatment: Yes, [facility]  CSW was not informed of pt discharge.  When CSW discovered plan for discharge, pt had already been released from the hospital.  Ride and smoking cessation program could not be discussed.   Joanne Chars, LCSW 04/15/2019, 2:01 PM

## 2019-04-15 NOTE — [facility] Suicide Risk Assessment (Signed)
[facility] Discharge Suicide Risk Assessment   Principal Problem: Alcoholism/relapse Discharge Diagnoses: Active Problems:   MDD (major depressive disorder), severe ([place])   Total Time spent with patient: 45 minutes  Musculoskeletal: Strength & Muscle Tone: within normal limits Gait & Station: normal Patient leans: N/A  Psychiatric Specialty Exam: ROS  Blood pressure 98/70, pulse 70, temperature 98.2 F (36.8 C), temperature source Oral, resp. rate 16, height 6\' 3"  (1.905 m), weight 82.6 kg, SpO2 97 %.Body mass index is 22.75 kg/m.  General Appearance: Casual  Eye Contact::  Good  Speech:  Clear and Coherent409  Volume:  Normal  Mood:  Euthymic  Affect:  Congruent  Thought Process:  Coherent and Descriptions of Associations: Intact  Orientation:  Full (Time, Place, and Person)  Thought Content:  Logical  Suicidal Thoughts:  No  Homicidal Thoughts:  No  Memory:  Immediate;   Fair  Judgement:  Fair  Insight:  Fair  Psychomotor Activity:  Normal  Concentration:  Good  Recall:  Good  Fund of Knowledge:Good  Language: Good  Akathisia:  Negative  Handed:  Right  AIMS (if indicated):     Assets:  Communication Skills Desire for Improvement  Sleep:  Number of Hours: 6.75  Cognition: WNL  ADL's:  Intact   Mental Status Per Nursing Assessment::   On Admission:  Suicidal ideation indicated by patient, Suicide plan  Demographic Factors:  Male  Loss Factors: Decrease in vocational status  Historical Factors: Impulsivity  Risk Reduction Factors:   Sense of responsibility to family and Religious beliefs about death  Continued Clinical Symptoms:  Alcohol/Substance Abuse/Dependencies  Cognitive Features That Contribute To Risk:  Polarized thinking    Suicide Risk:  Minimal: No identifiable suicidal ideation.  Patients presenting with no risk factors but with morbid ruminations; may be classified as minimal risk based on the severity of the depressive symptoms  Follow-up  Information    [facility] Follow up on 04/18/2019.   Why: Telephonic hospital follow up appointment is Tuesday, 7/2 at 4:15p.  The provider will contact you the day of the appointment.  Contact information: [address] [place] [address] [phone]           Plan Of Care/Follow-up recommendations:  Activity:  full  Aleta Manternach, MD 04/15/2019, 9:28 AM

## 2019-04-15 NOTE — Discharge Summary (Signed)
Physician Discharge Summary Note  Patient:  Tim [name] is an 63 y.o., male MRN:  [id number] DOB:  11/25/1955 Patient phone:  [phone] (home)  Patient address:   [place] [address],  Total Time spent with patient: 45 minutes  Date of Admission:  04/10/2019 Date of Discharge: 04/15/2019  Reason for Admission:   This is a repeat admission for Tim [name] is 63 year old patient who states that he had relapsed on alcohol, and requires detox measures he further states that he had "gotten with the wrong crowd" that his friends who were daily drinkers, normally his intake is more modest just 2 or 4 beers per day but he had escalated his intake recently to a case of beer and therefore feels he needs to "stop it while he can" achieve detox.  He had made suicidal statements while acutely intoxicated but states that he does not have suicidal thoughts plans or intent when he is not impaired.  He can contract for safety here.  Denies seizures or hallucinations.  No tremors noted-   Principal Problem: Alcoholism/relapse Discharge Diagnoses: Active Problems:   MDD (major depressive disorder), severe (HCC)  Past Medical History:  Past Medical History:  Diagnosis Date  . Alcoholism ([place])   . Anxiety   . COPD (chronic obstructive pulmonary disease) ([place])   . Depression   . Hep C w/o coma, chronic ([place])   . History of hiatal hernia    hernia rt groin  . Mental health disorder   . Scoliosis   . Thyroid disease     Past Surgical History:  Procedure Laterality Date  . SKIN CANCER EXCISION    . THYROID SURGERY     Family History:  Family History  Problem Relation Age of Onset  . Dementia Mother    Family Psychiatric  History: neg Social History:  Social History   Substance and Sexual Activity  Alcohol Use Yes   Comment: drank 1/5 today.     Social History   Substance and Sexual Activity  Drug Use Yes  . Types: Cocaine, Marijuana   Comment: also states  will take pain pills every now and then, last use of cocaine and ETOH today at 1400    Social History   Socioeconomic History  . Marital status: Single    Spouse name: Not on file  . Number of children: Not on file  . Years of education: Not on file  . Highest education level: Not on file  Occupational History  . Not on file  Social Needs  . Financial resource strain: Not on file  . Food insecurity    Worry: Not on file    Inability: Not on file  . Transportation needs    Medical: Not on file    Non-medical: Not on file  Tobacco Use  . Smoking status: Current Every Day Smoker    Packs/day: 1.00    Years: 40.00    Pack years: 40.00    Types: Cigarettes  . Smokeless tobacco: Never Used  Substance and Sexual Activity  . Alcohol use: Yes    Comment: drank 1/5 today.  . Drug use: Yes    Types: Cocaine, Marijuana    Comment: also states will take pain pills every now and then, last use of cocaine and ETOH today at 1400  . Sexual activity: Never    Birth control/protection: None  Lifestyle  . Physical activity    Days per week: Not on file  Minutes per session: Not on file  . Stress: Not on file  Relationships  . Social Musicianconnections    Talks on phone: Not on file    Gets together: Not on file    Attends religious service: Not on file    Active member of club or organization: Not on file    Attends meetings of clubs or organizations: Not on file    Relationship status: Not on file  Other Topics Concern  . Not on file  Social History Narrative   ** Merged History Encounter [facility]**        Hospital Course:   Patient was cordial and cooperative in detox in a very successful manner he displayed no danger behaviors mood remain generally euthymic.  By the date of the 29th he had accomplished his goal is detox was complete he was requesting discharge she had no cravings tremors or withdrawal and had stave off a further relapse.  Med adjustments made in addition to current meds we  added Campral Musculoskeletal: Strength & Muscle Tone: within normal limits Gait & Station: normal Patient leans: N/A  Psychiatric Specialty Exam: ROS  Blood pressure 98/70, pulse 70, temperature 98.2 F (36.8 C), temperature source Oral, resp. rate 16, height 6\' 3"  (1.905 m), weight 82.6 kg, SpO2 97 %.Body mass index is 22.75 kg/m.  General Appearance: Casual  Eye Contact::  Good  Speech:  Clear and Coherent409  Volume:  Normal  Mood:  Euthymic  Affect:  Congruent  Thought Process:  Coherent and Descriptions of Associations: Intact  Orientation:  Full (Time, Place, and Person)  Thought Content:  Logical  Suicidal Thoughts:  No  Homicidal Thoughts:  No  Memory:  Immediate;   Fair  Judgement:  Fair  Insight:  Fair  Psychomotor Activity:  Normal  Concentration:  Good  Recall:  Good  Fund of Knowledge:Good  Language: Good  Akathisia:  Negative  Handed:  Right  AIMS (if indicated):     Assets:  Communication Skills Desire for Improvement  Sleep:  Number of Hours: 6.75  Cognition: WNL  ADL's:  Intact    Physical Findings: AIMS: Facial and Oral Movements Muscles of Facial Expression: None, normal Lips and Perioral Area: None, normal Jaw: None, normal Tongue: None, normal,Extremity Movements Upper (arms, wrists, hands, fingers): None, normal Lower (legs, knees, ankles, toes): None, normal, Trunk Movements Neck, shoulders, hips: None, normal, Overall Severity Severity of abnormal movements (highest score from questions above): None, normal Incapacitation due to abnormal movements: None, normal Patient's awareness of abnormal movements (rate only patient's report): No Awareness, Dental Status Current problems with teeth and/or dentures?: No Does patient usually wear dentures?: No  CIWA:  CIWA-Ar Total: 0 COWS:      Have you used any form of tobacco in the last 30 days? (Cigarettes, Smokeless Tobacco, Cigars, and/or Pipes): Yes  Has this patient used any form of  tobacco in the last 30 days? (Cigarettes, Smokeless Tobacco, Cigars, and/or Pipes) Yes, No  Blood Alcohol level:  Lab Results  Component Value Date   ETH 234 (H) 04/09/2019   ETH 162 (H) 11/10/2018    Metabolic Disorder Labs:  Lab Results  Component Value Date   HGBA1C 5.4 08/22/2017   MPG 108.28 08/22/2017   No results found for: PROLACTIN Lab Results  Component Value Date   CHOL 209 (H) 08/22/2017   TRIG 140 08/22/2017   HDL 57 08/22/2017   CHOLHDL 3.7 08/22/2017   VLDL 28 08/22/2017   LDLCALC 124 (H)  08/22/2017    See Psychiatric Specialty Exam and Suicide Risk Assessment completed by Attending Physician prior to discharge.  Discharge destination:  Home  Is patient on multiple antipsychotic therapies at discharge:  No   Has Patient had three or more failed trials of antipsychotic monotherapy by history:  No  Recommended Plan for Multiple Antipsychotic Therapies: NA   Allergies as of 04/15/2019   No Known Allergies     Medication List    TAKE these medications     Indication  acamprosate 333 MG tablet Commonly known as: CAMPRAL Take 2 tablets (666 mg total) by mouth 3 (three) times daily.  Indication: Abuse or Misuse of Alcohol   aspirin 325 MG EC tablet Take 325 mg by mouth daily as needed for pain.  Indication: Pain   escitalopram 20 MG tablet Commonly known as: LEXAPRO Take 1 tablet (20 mg total) by mouth daily. What changed:   medication strength  how much to take  Indication: Generalized Anxiety Disorder   gabapentin 300 MG capsule Commonly known as: NEURONTIN Take 1 capsule (300 mg total) by mouth 3 (three) times daily.  Indication: Abuse or Misuse of Alcohol   hydrOXYzine 25 MG tablet Commonly known as: ATARAX/VISTARIL Take 25 mg by mouth every 6 (six) hours as needed for anxiety.  Indication: Feeling Anxious   risperiDONE 0.25 MG tablet Commonly known as: RISPERDAL Take 0.25 mg by mouth 2 (two) times daily.  Indication: Major  Depressive Disorder   traZODone 100 MG tablet Commonly known as: DESYREL Take 1 tablet (100 mg total) at bedtime by mouth. For sleep What changed:   when to take this  reasons to take this  additional instructions  Indication: Trouble Sleeping      Follow-up Information    [facility] Follow up on 04/18/2019.   Why: Telephonic hospital follow up appointment is Tuesday, 7/2 at 4:15p.  The provider will contact you the day of the appointment.  Contact information: [address] [place] [id number] [phone]           SignedMalvin Johns: Haadi Santellan, MD 04/15/2019, 9:33 AM

## 2019-04-15 NOTE — Progress Notes (Addendum)
Spiritual care group on grief and loss facilitated by chaplain Jerene Pitch  Group Goal:  Support / Education around grief and loss Members engage in facilitated group support and psycho-social education.  Group Description:  Following introductions and group rules, group members engaged in facilitated group dialog and support around topic of loss, with particular support around experiences of loss in their lives. Group Identified types of loss (relationships / self / things) and identified patterns, circumstances, and changes that precipitate losses. Reflected on thoughts / feelings around loss, normalized grief responses, and recognized variety in grief experience. Patient Progress: Present throughout group.  Tim [name] was attentive.  Engaged in dialog at end of group - speaking with other group members about strategies that had worked for him to develop new coping mechanisms outside of use.  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

## 2019-04-15 NOTE — Progress Notes (Signed)
Recreation Therapy Notes  Date:  6.29.20 Time: 0930 Location: [address] Dayroom  Group Topic: Stress Management  Goal Area(s) Addresses:  Patient will identify positive stress management techniques. Patient will identify benefits of using stress management post d/c.  Behavioral Response: Engaged  Intervention: Stress Management  Activity :  Meditation.  LRT introduced the stress management technique of meditation.  LRT played a meditation that focused on taking on the stillness of the mountain.  Education:  Stress Management, Discharge Planning.   Education Outcome: Acknowledges Education  Clinical Observations/Feedback:  Pt attended and participated in group.    Victorino Sparrow, LRT/CTRS         Victorino Sparrow A 04/15/2019 10:46 AM

## 2019-05-06 ENCOUNTER — Encounter (HOSPITAL_COMMUNITY): Payer: Self-pay | Admitting: Emergency Medicine

## 2019-05-06 ENCOUNTER — Emergency Department (HOSPITAL_COMMUNITY): Payer: Medicaid Other

## 2019-05-06 ENCOUNTER — Other Ambulatory Visit: Payer: Self-pay

## 2019-05-06 ENCOUNTER — Emergency Department (HOSPITAL_COMMUNITY)
Admission: EM | Admit: 2019-05-06 | Discharge: 2019-05-06 | Disposition: A | Discharge status: Home / Self Care | Payer: Medicaid Other | Attending: Emergency Medicine | Admitting: Emergency Medicine

## 2019-05-06 DIAGNOSIS — Y9389 Activity, other specified: Secondary | ICD-10-CM | POA: Insufficient documentation

## 2019-05-06 DIAGNOSIS — Y929 Unspecified place or not applicable: Secondary | ICD-10-CM | POA: Diagnosis not present

## 2019-05-06 DIAGNOSIS — J449 Chronic obstructive pulmonary disease, unspecified: Secondary | ICD-10-CM | POA: Diagnosis not present

## 2019-05-06 DIAGNOSIS — Z85828 Personal history of other malignant neoplasm of skin: Secondary | ICD-10-CM | POA: Insufficient documentation

## 2019-05-06 DIAGNOSIS — S6991XA Unspecified injury of right wrist, hand and finger(s), initial encounter: Secondary | ICD-10-CM | POA: Diagnosis present

## 2019-05-06 DIAGNOSIS — Z79899 Other long term (current) drug therapy: Secondary | ICD-10-CM | POA: Insufficient documentation

## 2019-05-06 DIAGNOSIS — S61011A Laceration without foreign body of right thumb without damage to nail, initial encounter: Secondary | ICD-10-CM | POA: Insufficient documentation

## 2019-05-06 DIAGNOSIS — Y999 Unspecified external cause status: Secondary | ICD-10-CM | POA: Insufficient documentation

## 2019-05-06 DIAGNOSIS — Z23 Encounter for immunization: Secondary | ICD-10-CM | POA: Insufficient documentation

## 2019-05-06 DIAGNOSIS — F17210 Nicotine dependence, cigarettes, uncomplicated: Secondary | ICD-10-CM | POA: Diagnosis not present

## 2019-05-06 DIAGNOSIS — S62514A Nondisplaced fracture of proximal phalanx of right thumb, initial encounter for closed fracture: Secondary | ICD-10-CM | POA: Diagnosis not present

## 2019-05-06 DIAGNOSIS — S61212A Laceration without foreign body of right middle finger without damage to nail, initial encounter: Secondary | ICD-10-CM | POA: Diagnosis not present

## 2019-05-06 MED ORDER — TETANUS-DIPHTH-ACELL PERTUSSIS 5-2.5-18.5 LF-MCG/0.5 IM SUSP
0.5000 mL | Freq: Once | INTRAMUSCULAR | Status: AC
  Administered 2019-05-06: 0.5 mL via INTRAMUSCULAR
  Filled 2019-05-06: qty 0.5

## 2019-05-06 MED ORDER — DOXYCYCLINE HYCLATE 100 MG PO CAPS: 100.0000 mg | ORAL_CAPSULE | Freq: Two times a day (BID) | ORAL | 0 refills | Status: AC

## 2019-05-06 NOTE — ED Triage Notes (Signed)
Pt has lacerations to right middle finger and thumb. [employer] at bedside.
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
## 2019-05-06 NOTE — ED Notes (Signed)
Ortho tech made aware of thumb spica ordered and laceration dressed.
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
## 2019-05-30 ENCOUNTER — Other Ambulatory Visit: Payer: Self-pay

## 2019-05-30 ENCOUNTER — Emergency Department (HOSPITAL_COMMUNITY): Payer: Medicaid Other

## 2019-05-30 ENCOUNTER — Encounter (HOSPITAL_COMMUNITY): Payer: Self-pay | Admitting: Emergency Medicine

## 2019-05-30 ENCOUNTER — Emergency Department (HOSPITAL_COMMUNITY)
Admission: EM | Admit: 2019-05-30 | Discharge: 2019-05-30 | Disposition: A | Discharge status: Home / Self Care | Payer: Medicaid Other | Attending: Emergency Medicine | Admitting: Emergency Medicine

## 2019-05-30 DIAGNOSIS — R0602 Shortness of breath: Secondary | ICD-10-CM | POA: Diagnosis present

## 2019-05-30 DIAGNOSIS — J441 Chronic obstructive pulmonary disease with (acute) exacerbation: Secondary | ICD-10-CM | POA: Diagnosis not present

## 2019-05-30 DIAGNOSIS — R05 Cough: Secondary | ICD-10-CM | POA: Insufficient documentation

## 2019-05-30 DIAGNOSIS — F17210 Nicotine dependence, cigarettes, uncomplicated: Secondary | ICD-10-CM | POA: Diagnosis not present

## 2019-05-30 LAB — CBC
HCT: 41.2 % (ref 39.0–52.0)
Hemoglobin: 15 g/dL (ref 13.0–17.0)
MCH: 33.5 pg (ref 26.0–34.0)
MCHC: 36.4 g/dL — ABNORMAL HIGH (ref 30.0–36.0)
MCV: 92 fL (ref 80.0–100.0)
Platelets: 188 10*3/uL (ref 150–400)
RBC: 4.48 MIL/uL (ref 4.22–5.81)
RDW: 13.2 % (ref 11.5–15.5)
WBC: 5.4 10*3/uL (ref 4.0–10.5)
nRBC: 0 % (ref 0.0–0.2)

## 2019-05-30 LAB — BASIC METABOLIC PANEL
Anion gap: 14 (ref 5–15)
BUN: 15 mg/dL (ref 8–23)
CO2: 17 mmol/L — ABNORMAL LOW (ref 22–32)
Calcium: 9.9 mg/dL (ref 8.9–10.3)
Chloride: 105 mmol/L (ref 98–111)
Creatinine, Ser: 0.98 mg/dL (ref 0.61–1.24)
GFR calc Af Amer: >60 mL/min (ref >60)
GFR calc non Af Amer: >60 mL/min (ref >60)
Glucose, Bld: 104 mg/dL — ABNORMAL HIGH (ref 70–99)
Potassium: 3.5 mmol/L (ref 3.5–5.1)
Sodium: 136 mmol/L (ref 135–145)

## 2019-05-30 MED ORDER — DOXYCYCLINE HYCLATE 100 MG PO TABS
100.0000 mg | ORAL_TABLET | Freq: Once | ORAL | Status: AC
  Administered 2019-05-30: 03:00:00 100 mg via ORAL
  Filled 2019-05-30: qty 1

## 2019-05-30 MED ORDER — PREDNISONE 10 MG PO TABS: 50.0000 mg | ORAL_TABLET | Freq: Every day | ORAL | 0 refills | Status: DC

## 2019-05-30 MED ORDER — ALBUTEROL SULFATE HFA 108 (90 BASE) MCG/ACT IN AERS: 1.0000 | INHALATION_SPRAY | Freq: Four times a day (QID) | RESPIRATORY_TRACT | 1 refills | Status: DC | PRN

## 2019-05-30 MED ORDER — DOXYCYCLINE HYCLATE 100 MG PO CAPS: 100.0000 mg | ORAL_CAPSULE | Freq: Two times a day (BID) | ORAL | 0 refills | Status: DC

## 2019-05-30 MED ORDER — METHYLPREDNISOLONE SODIUM SUCC 125 MG IJ SOLR
125.0000 mg | Freq: Once | INTRAMUSCULAR | Status: AC
  Administered 2019-05-30: 03:00:00 125 mg via INTRAVENOUS
  Filled 2019-05-30: qty 2

## 2019-05-30 MED ORDER — ALBUTEROL SULFATE HFA 108 (90 BASE) MCG/ACT IN AERS: 4.0000 | INHALATION_SPRAY | Freq: Once | RESPIRATORY_TRACT | Status: DC

## 2019-05-30 MED ORDER — ALBUTEROL SULFATE HFA 108 (90 BASE) MCG/ACT IN AERS
4.0000 | INHALATION_SPRAY | RESPIRATORY_TRACT | Status: AC
  Administered 2019-05-30 (×2): 4 via RESPIRATORY_TRACT
  Filled 2019-05-30: qty 6.7

## 2019-05-30 NOTE — ED Notes (Signed)
Patient verbalizes understanding of discharge instructions. Opportunity for questioning and answers were provided. Armband removed by staff, pt discharged from ED ambulatory.   

## 2019-05-30 NOTE — Discharge Instructions (Signed)
We saw you in the ER for your breathing related complains. We gave you some breathing treatments in the ER, and seems like your symptoms have improved. °Please take albuterol as needed every 4 hours. °Please take the medications prescribed. °Please refrain from smoking or smoke exposure. °Please see a primary care doctor in 1 week. °Return to the ER if your symptoms worsen. ° °

## 2019-05-30 NOTE — ED Triage Notes (Signed)
BIB [facility] from home with c/o of shortness of breath X 2 hours. Hx of CHF. Pt states he used his inhaler wihtout relief. Denies CP, Fevers, or sick contacts. Pt does report drinking 6 pack tonight.

## 2019-05-30 NOTE — ED Provider Notes (Addendum)
[facility] EMERGENCY DEPARTMENT Provider Note   CSN: [id number] Arrival date & time: 05/30/19  0117    History   Chief Complaint Chief Complaint  Patient presents with  . Shortness of Breath    HPI Tim [name] is a 63 y.o. male.     HPI  63 year old male comes in a chief complaint of shortness of breath.  Tim [name] has history of COPD.  Patient reports that Tim [name] started getting short of breath earlier today.  Tim [name] has wheezing with a nonproductive cough that is new without any phlegm.  Patient denies any associated fevers, chills, chest pain.  Tim [name] has been taking his medication as prescribed.  Patient continues to smoke.  Tim [name] denies any sick contacts.  Past Medical History:  Diagnosis Date  . Alcoholism ([place])   . Anxiety   . COPD (chronic obstructive pulmonary disease) ([place])   . Depression   . Hep C w/o coma, chronic ([place])   . History of hiatal hernia    hernia rt groin  . Mental health disorder   . Scoliosis   . Thyroid disease     Patient Active Problem List   Diagnosis Date Noted  . MDD (major depressive disorder), severe ([place]) 04/10/2019  . 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 Cocaine abuse with cocaine-induced mood disorder ([place]) 01/29/2018  . Polysubstance dependence ([place]) 01/29/2018  . Severe recurrent major depression without psychotic features ([place]) 08/20/2017  . Severe episode of recurrent major depressive disorder, without psychotic features ([place]) 07/08/2016  . MDD (major depressive disorder), recurrent severe, without psychosis ([place]) 05/23/2016  . Hepatitis C 05/23/2016  . Alcohol use disorder, severe, dependence ([place]) 11/05/2015  . Depression, major, recurrent, moderate ([place]) 11/05/2015  . GAD (generalized anxiety disorder) 11/05/2015  . Cellulitis of right lower extremity 03/29/2015    Past Surgical History:  Procedure Laterality Date  . SKIN CANCER EXCISION    . THYROID SURGERY           Home Medications    Prior to Admission medications   Medication Sig Start Date End Date Taking? Authorizing Provider  acamprosate (CAMPRAL) 333 MG tablet Take 2 tablets (666 mg total) by mouth 3 (three) times daily. 04/15/19   Johnn Hai, MD  albuterol (VENTOLIN HFA) 108 (90 Base) MCG/ACT inhaler Inhale 1-2 puffs into the lungs every 6 (six) hours as needed for wheezing or shortness of breath. 05/30/19   Varney Biles, MD  aspirin 325 MG EC tablet Take 325 mg by mouth daily as needed for pain.    [provider]  doxycycline (VIBRAMYCIN) 100 MG capsule Take 1 capsule (100 mg total) by mouth 2 (two) times daily. 05/30/19   Varney Biles, MD  escitalopram (LEXAPRO) 20 MG tablet Take 1 tablet (20 mg total) by mouth daily. 04/15/19   Johnn Hai, MD  gabapentin (NEURONTIN) 300 MG capsule Take 1 capsule (300 mg total) by mouth 3 (three) times daily. 11/10/18   Patrecia Pour, NP  hydrOXYzine (ATARAX/VISTARIL) 25 MG tablet Take 25 mg by mouth every 6 (six) hours as needed for anxiety.    [provider]  predniSONE (DELTASONE) 10 MG tablet Take 5 tablets (50 mg total) by mouth daily. 05/30/19   Varney Biles, MD  risperiDONE (RISPERDAL) 0.25 MG tablet Take 0.25 mg by mouth 2 (two) times daily.    [provider]  traZODone (DESYREL) 100 MG tablet Take 1 tablet (100 mg total) at bedtime by mouth. For sleep  Patient taking differently: Take 100 mg by mouth at bedtime as needed for sleep.  08/24/17   Money, Gerlene Burdockravis B, FNP    Family History Family History  Problem Relation Age of Onset  . Dementia Mother     Social History Social History   Tobacco Use  . Smoking status: Current Every Day Smoker    Packs/day: 1.00    Years: 40.00    Pack years: 40.00    Types: Cigarettes  . Smokeless tobacco: Never Used  Substance Use Topics  . Alcohol use: Yes    Comment: "6 pack a day"  . Drug use: Yes    Types: Cocaine, Marijuana    Comment: also states will take pain  pills every now and then, last use of cocaine and ETOH today at 1400     Allergies   Patient has no known allergies.   Review of Systems Review of Systems  Constitutional: Positive for activity change.  Respiratory: Positive for cough and shortness of breath.   Cardiovascular: Negative for chest pain.  Gastrointestinal: Negative for nausea and vomiting.  All other systems reviewed and are negative.    Physical Exam Updated Vital Signs BP 104/68   Pulse 72   Temp 97.8 F (36.6 C) (Oral)   Resp 18   Ht 6\' 3"  (1.905 m)   Wt 79.8 kg   SpO2 92%   BMI 22.00 kg/m   Physical Exam Vitals signs and nursing note reviewed.  Constitutional:      Appearance: Tim [name] is well-developed.  HENT:     Head: Atraumatic.  Neck:     Musculoskeletal: Neck supple.  Cardiovascular:     Rate and Rhythm: Normal rate.  Pulmonary:     Effort: Pulmonary effort is normal.     Breath sounds: Wheezing present. No rhonchi or rales.  Musculoskeletal:     Right lower leg: No edema.     Left lower leg: No edema.  Skin:    General: Skin is warm.  Neurological:     Mental Status: Tim [name] is alert and oriented to person, place, and time.      ED Treatments / Results  Labs (all labs ordered are listed, but only abnormal results are displayed) Labs Reviewed  BASIC METABOLIC PANEL - Abnormal; Notable for the following components:      Result Value   CO2 17 (*)    Glucose, Bld 104 (*)    All other components within normal limits  CBC - Abnormal; Notable for the following components:   MCHC 36.4 (*)    All other components within normal limits    EKG None  Radiology Dg Chest Portable 1 View  Result Date: 05/30/2019 CLINICAL DATA:  63 year old male with acute shortness of breath. EXAM: PORTABLE CHEST 1 VIEW COMPARISON:  12/26/2017 and earlier. FINDINGS: Portable AP semi upright view at 0123 hours. Chronic pulmonary hyperinflation and interstitial opacity. Mild chronic apical architectural  distortion. Mediastinal contours remain within normal limits. Visualized tracheal air column is within normal limits. No pneumothorax, pulmonary edema, pleural effusion or acute pulmonary opacity. No acute osseous abnormality identified. IMPRESSION: Chronic lung disease. No superimposed acute findings are identified. Electronically Signed   By: Odessa FlemingH  Hall M.D.   On: 05/30/2019 01:50    Procedures Procedures (including critical care time)  Medications Ordered in ED Medications  methylPREDNISolone sodium succinate (SOLU-MEDROL) 125 mg/2 mL injection 125 mg (125 mg Intravenous Given 05/30/19 0244)  doxycycline (VIBRA-TABS) tablet 100 mg (100 mg Oral Given 05/30/19  0244)  albuterol (VENTOLIN HFA) 108 (90 Base) MCG/ACT inhaler 4 puff (4 puffs Inhalation Given 05/30/19 0331)     Initial Impression / Assessment and Plan / ED Course  I have reviewed the triage vital signs and the nursing notes.  Pertinent labs & imaging results that were available during my care of the patient were reviewed by me and considered in my medical decision making (see chart for details).        63 year old comes in a chief complaint of cough and shortness of breath.  Tim [name] has history of COPD.  Patient reports that earlier today started having sudden onset severe shortness of breath.  Tim [name] is wheezing all over.  We will give him nebulizer treatment and reassess.  There is no associated chest pain, fevers, chills which is reassuring.  Patient denies any known sick contacts.   Tim [name] was evaluated in Emergency Department on 05/30/2019 for the symptoms described in the history of present illness. Tim [name] was evaluated in the context of the global COVID-19 pandemic, which necessitated consideration that the patient might be at risk for infection with the SARS-CoV-2 virus that causes COVID-19. Institutional protocols and algorithms that pertain to the evaluation of patients at risk for COVID-19 are in a state of rapid change based on  information released by regulatory bodies including the CDC and federal and state organizations. These policies and algorithms were followed during the patient's care in the ED.   4:33 AM Patient reassessed.  The wheezing has cleared. Smoking cessation instruction/counseling given:  counseled patient on the dangers of tobacco use, advised patient to stop smoking, and reviewed strategies to maximize success. Discussion 3-5 min.   Final Clinical Impressions(s) / ED Diagnoses   Final diagnoses:  COPD with acute exacerbation [facility](HCC)    ED Discharge Orders         Ordered    doxycycline (VIBRAMYCIN) 100 MG capsule  2 times daily     05/30/19 0433    predniSONE (DELTASONE) 10 MG tablet  Daily     05/30/19 0433    albuterol (VENTOLIN HFA) 108 (90 Base) MCG/ACT inhaler  Every 6 hours PRN     05/30/19 0433              Derwood KaplanNanavati, Shantell Belongia, MD 05/30/19 [phone]

## 2019-06-14 ENCOUNTER — Other Ambulatory Visit: Payer: Self-pay

## 2019-06-14 ENCOUNTER — Emergency Department (HOSPITAL_COMMUNITY)
Admission: EM | Admit: 2019-06-14 | Discharge: 2019-06-14 | Disposition: A | Discharge status: Home / Self Care | Payer: Medicaid Other | Attending: Emergency Medicine | Admitting: Emergency Medicine

## 2019-06-14 ENCOUNTER — Encounter (HOSPITAL_COMMUNITY): Payer: Self-pay

## 2019-06-14 DIAGNOSIS — R45851 Suicidal ideations: Secondary | ICD-10-CM | POA: Insufficient documentation

## 2019-06-14 DIAGNOSIS — F10288 Alcohol dependence with other alcohol-induced disorder: Secondary | ICD-10-CM | POA: Diagnosis not present

## 2019-06-14 DIAGNOSIS — F10220 Alcohol dependence with intoxication, uncomplicated: Secondary | ICD-10-CM | POA: Diagnosis present

## 2019-06-14 DIAGNOSIS — J449 Chronic obstructive pulmonary disease, unspecified: Secondary | ICD-10-CM | POA: Insufficient documentation

## 2019-06-14 DIAGNOSIS — F17210 Nicotine dependence, cigarettes, uncomplicated: Secondary | ICD-10-CM | POA: Insufficient documentation

## 2019-06-14 DIAGNOSIS — Z7982 Long term (current) use of aspirin: Secondary | ICD-10-CM | POA: Insufficient documentation

## 2019-06-14 DIAGNOSIS — F1020 Alcohol dependence, uncomplicated: Secondary | ICD-10-CM | POA: Diagnosis present

## 2019-06-14 DIAGNOSIS — F329 Major depressive disorder, single episode, unspecified: Secondary | ICD-10-CM | POA: Diagnosis present

## 2019-06-14 LAB — COMPREHENSIVE METABOLIC PANEL
ALT: 15 U/L (ref 0–44)
AST: 17 U/L (ref 15–41)
Albumin: 3.7 g/dL (ref 3.5–5.0)
Alkaline Phosphatase: 58 U/L (ref 38–126)
Anion gap: 14 (ref 5–15)
BUN: 8 mg/dL (ref 8–23)
CO2: 21 mmol/L — ABNORMAL LOW (ref 22–32)
Calcium: 9.3 mg/dL (ref 8.9–10.3)
Chloride: 102 mmol/L (ref 98–111)
Creatinine, Ser: 1.14 mg/dL (ref 0.61–1.24)
GFR calc Af Amer: >60 mL/min (ref >60)
GFR calc non Af Amer: >60 mL/min (ref >60)
Glucose, Bld: 77 mg/dL (ref 70–99)
Potassium: 3.2 mmol/L — ABNORMAL LOW (ref 3.5–5.1)
Sodium: 137 mmol/L (ref 135–145)
Total Bilirubin: 0.3 mg/dL (ref 0.3–1.2)
Total Protein: 6.3 g/dL — ABNORMAL LOW (ref 6.5–8.1)

## 2019-06-14 LAB — CBC
HCT: 43.4 % (ref 39.0–52.0)
Hemoglobin: 15.1 g/dL (ref 13.0–17.0)
MCH: 32.6 pg (ref 26.0–34.0)
MCHC: 34.8 g/dL (ref 30.0–36.0)
MCV: 93.7 fL (ref 80.0–100.0)
Platelets: 221 10*3/uL (ref 150–400)
RBC: 4.63 MIL/uL (ref 4.22–5.81)
RDW: 13.4 % (ref 11.5–15.5)
WBC: 8.5 10*3/uL (ref 4.0–10.5)
nRBC: 0 % (ref 0.0–0.2)

## 2019-06-14 LAB — SALICYLATE LEVEL: Salicylate Lvl: <7 mg/dL (ref 2.8–30.0)

## 2019-06-14 LAB — ACETAMINOPHEN LEVEL: Acetaminophen (Tylenol), Serum: <10 ug/mL — ABNORMAL LOW (ref 10–30)

## 2019-06-14 LAB — RAPID URINE DRUG SCREEN, HOSP PERFORMED
Amphetamines: NOT DETECTED
Barbiturates: NOT DETECTED
Benzodiazepines: NOT DETECTED
Cocaine: NOT DETECTED
Opiates: NOT DETECTED
Tetrahydrocannabinol: POSITIVE — AB

## 2019-06-14 LAB — ETHANOL: Alcohol, Ethyl (B): 114 mg/dL — ABNORMAL HIGH (ref <10)

## 2019-06-14 MED ORDER — VITAMIN B-1 100 MG PO TABS
100.0000 mg | ORAL_TABLET | Freq: Every day | ORAL | Status: DC
  Administered 2019-06-14: 100 mg via ORAL
  Filled 2019-06-14: qty 1

## 2019-06-14 MED ORDER — LORAZEPAM 1 MG PO TABS: 0.0000 mg | ORAL_TABLET | Freq: Two times a day (BID) | ORAL | Status: DC

## 2019-06-14 MED ORDER — LORAZEPAM 2 MG/ML IJ SOLN: 0.0000 mg | Freq: Four times a day (QID) | INTRAMUSCULAR | Status: DC

## 2019-06-14 MED ORDER — ALBUTEROL SULFATE HFA 108 (90 BASE) MCG/ACT IN AERS: 1.0000 | INHALATION_SPRAY | Freq: Four times a day (QID) | RESPIRATORY_TRACT | Status: DC | PRN

## 2019-06-14 MED ORDER — LORAZEPAM 2 MG/ML IJ SOLN: 0.0000 mg | Freq: Two times a day (BID) | INTRAMUSCULAR | Status: DC

## 2019-06-14 MED ORDER — GABAPENTIN 300 MG PO CAPS
300.0000 mg | ORAL_CAPSULE | Freq: Three times a day (TID) | ORAL | Status: DC
  Administered 2019-06-14: 11:00:00 300 mg via ORAL
  Filled 2019-06-14: qty 1

## 2019-06-14 MED ORDER — THIAMINE HCL 100 MG/ML IJ SOLN: 100.0000 mg | Freq: Every day | INTRAMUSCULAR | Status: DC

## 2019-06-14 MED ORDER — LORAZEPAM 1 MG PO TABS: 0.0000 mg | ORAL_TABLET | Freq: Four times a day (QID) | ORAL | Status: DC

## 2019-06-14 NOTE — ED Notes (Signed)
tts into see 

## 2019-06-14 NOTE — ED Notes (Signed)
Peer support into talk with pt

## 2019-06-14 NOTE — [facility] Suicide Risk Assessment (Signed)
Suicide Risk Assessment  Discharge Assessment   [facility] Discharge Suicide Risk Assessment   Principal Problem: Alcohol use disorder, severe, dependence ([place]) Discharge Diagnoses: Principal Problem:   Alcohol use disorder, severe, dependence ([place])   Total Time spent with patient: 30 minutes  Musculoskeletal: Strength & Muscle Tone: within normal limits Gait & Station: normal Patient leans: N/A  Psychiatric Specialty Exam:   Blood pressure 109/72, pulse 73, temperature 98.3 F (36.8 C), resp. rate 18, SpO2 95 %.There is no height or weight on file to calculate BMI.  General Appearance: Casual  Eye Contact::  Good  Speech:  Clear and Coherent and Normal Rate409  Volume:  Normal  Mood:  Euthymic  Affect:  Congruent  Thought Process:  Coherent and Descriptions of Associations: Intact  Orientation:  Full (Time, Place, and Person)  Thought Content:  Logical  Suicidal Thoughts:  No  Homicidal Thoughts:  No  Memory:  Immediate;   Good Recent;   Good Remote;   Fair  Judgement:  Fair  Insight:  Fair  Psychomotor Activity:  Normal  Concentration:  Good  Recall:  Good  Fund of Knowledge:Good  Language: Good  Akathisia:  Negative  Handed:  Right  AIMS (if indicated):     Assets:  Agricultural consultant Housing  Sleep:     Cognition: WNL  ADL's:  Intact   Mental Status Per Nursing Assessment::   On Admission:     Demographic Factors:  Male, Low socioeconomic status and Unemployed  Loss Factors: Financial problems/change in socioeconomic status  Historical Factors: Family history of mental illness or substance abuse  Risk Reduction Factors:   Sense of responsibility to family  Continued Clinical Symptoms:  Alcohol/Substance Abuse/Dependencies  Cognitive Features That Contribute To Risk:  Closed-mindedness    Suicide Risk:  Minimal: No identifiable suicidal ideation.  Patients presenting with no risk factors but with morbid ruminations;  may be classified as minimal risk based on the severity of the depressive symptoms   Plan Of Care/Follow-up recommendations:  Activity:  as tolerated Diet:  Heart Healthy  Ethelene Hal, NP 06/14/2019, 1:40 PM

## 2019-06-14 NOTE — ED Notes (Signed)
[place] and has been bing drinking all week.  Pt reports that his last drink was yesterday afternoon.  Pt deneis si/hi/avh at thia time.  Pt reports that he does get shakey/sweaty when he does not drink.

## 2019-06-14 NOTE — Progress Notes (Addendum)
Patient ID: Tim [name], male   DOB: 08-05-1956, 63 y.o.   MRN: [id number]  Pt was seen and chart reviewed with treatment team and Dr Mariea Clonts. Pt denies suicidal/homicidal ideation, denies auditory/visual hallucinations and does not appear to be responding to internal stimuli. Pt's BAL was 114 on admission. He has a history of alcohol abuse and presented to the [facility] seeking help for alcohol detox. He is requesting to receive resources and support from Peer Support. Pt will be discharged after he is seen by Peer Support. Pt is psychiatrically clear.   Ethelene Hal, PMHNP-BC 06/14/2019    1339   Patient seen by telemedicine for psychiatric evaluation, chart reviewed and case discussed with the physician extender and developed treatment plan. Reviewed the information documented and agree with the treatment plan.  Buford Dresser, DO 06/14/19 3:15 PM

## 2019-06-14 NOTE — ED Provider Notes (Signed)
[facility]-EMERGENCY DEPT Provider Note   CSN: [id number] Arrival date & time: 06/14/19  0316     History   Chief Complaint Chief Complaint  Patient presents with  . Alcohol Intoxication  . Suicidal    HPI Tim [name] is a 63 y.o. male with a history of alcohol dependence, COPD, chronic hepatitis C, and depression who presents to the emergency department with a chief complaint of suicidal ideation.  The patient endorsed suicidal thoughts to triage staff, but on my evaluation is denying SI.  He also denies HI and auditory visual hallucinations.  He reports that he has been drinking approximately 24 beers daily for the last few months.  He does note that he has gone more than 24 hours "once or twice" without drinking alcohol.  He denies a history of seizures from withdrawal or DTs.  States his last drink was at 2000.  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  Although he denies SI to me, he states " I think that I should talk to somebody about my mental health because it has not been good recently."  He does not further elaborate on this.  He denies HI or auditory visual hallucinations.  He is endorsing a mild tremor in his hands that just started.  He denies fever, chills, abdominal pain, nausea, vomiting, diarrhea, shortness of breath, or cough.     The history is provided by the patient. No language interpreter was used.    Past Medical History:  Diagnosis Date  . Alcoholism (HCC)   . Anxiety   . COPD (chronic obstructive pulmonary disease) (HCC)   . Depression   . Hep C w/o coma, chronic (HCC)   . History of hiatal hernia    hernia rt groin  . Mental health disorder   . Scoliosis   . Thyroid disease     Patient Active Problem List   Diagnosis Date Noted  . MDD (major depressive disorder), severe (HCC) 04/10/2019  . 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 Cocaine abuse with cocaine-induced mood disorder (HCC) 01/29/2018  . Polysubstance dependence (HCC) 01/29/2018  . Severe recurrent major depression without psychotic features (HCC) 08/20/2017  . Severe episode of recurrent major depressive disorder, without psychotic features (HCC) 07/08/2016  . MDD (major depressive disorder), recurrent severe, without psychosis (HCC) 05/23/2016  . Hepatitis C 05/23/2016  . Alcohol use disorder, severe, dependence (HCC) 11/05/2015  . Depression, major, recurrent, moderate (HCC) 11/05/2015  . GAD (generalized anxiety disorder) 11/05/2015  . Cellulitis of right lower extremity 03/29/2015    Past Surgical History:  Procedure Laterality Date  . SKIN CANCER EXCISION    . THYROID SURGERY          Home Medications    Prior to Admission medications   Medication Sig Start Date End Date Taking? Authorizing Provider  acamprosate (CAMPRAL) 333 MG tablet Take 2 tablets (666 mg total) by mouth 3 (three) times daily. 04/15/19   Malvin Johns, MD  albuterol (VENTOLIN HFA) 108 (90 Base) MCG/ACT inhaler Inhale 1-2 puffs into the lungs every 6 (six) hours as needed for wheezing or shortness of breath. 05/30/19   Derwood Kaplan, MD  aspirin 325 MG EC tablet Take 325 mg by mouth daily as needed for pain.    [provider]  doxycycline (VIBRAMYCIN) 100 MG capsule Take 1 capsule (100 mg total) by mouth 2 (two) times  daily. 05/30/19   Derwood KaplanNanavati, Ankit, MD  escitalopram (LEXAPRO) 20 MG tablet Take 1 tablet (20 mg total) by mouth daily. 04/15/19   Malvin JohnsFarah, Brian, MD  gabapentin (NEURONTIN) 300 MG capsule Take 1 capsule (300 mg total) by mouth 3 (three) times daily. 11/10/18   Charm RingsLord, Jamison Y, NP  hydrOXYzine (ATARAX/VISTARIL) 25 MG tablet Take 25 mg by mouth every 6 (six) hours as needed for anxiety.    [provider]  predniSONE (DELTASONE) 10 MG tablet Take 5 tablets (50 mg total) by mouth daily. 05/30/19   Derwood KaplanNanavati, Ankit, MD   risperiDONE (RISPERDAL) 0.25 MG tablet Take 0.25 mg by mouth 2 (two) times daily.    [provider]  traZODone (DESYREL) 100 MG tablet Take 1 tablet (100 mg total) at bedtime by mouth. For sleep Patient taking differently: Take 100 mg by mouth at bedtime as needed for sleep.  08/24/17   Money, Gerlene Burdockravis B, FNP    Family History Family History  Problem Relation Age of Onset  . Dementia Mother     Social History Social History   Tobacco Use  . Smoking status: Current Every Day Smoker    Packs/day: 1.00    Years: 40.00    Pack years: 40.00    Types: Cigarettes  . Smokeless tobacco: Never Used  Substance Use Topics  . Alcohol use: Yes    Comment: "6 pack a day"  . Drug use: Yes    Types: Cocaine, Marijuana    Comment: also states will take pain pills every now and then, last use of cocaine and ETOH today at 1400     Allergies   Patient has no known allergies.   Review of Systems Review of Systems  Constitutional: Negative for appetite change, chills, diaphoresis and fever.  HENT: Negative for congestion and sore throat.   Respiratory: Negative for shortness of breath.   Cardiovascular: Negative for chest pain.  Gastrointestinal: Negative for abdominal pain, diarrhea, nausea and vomiting.  Genitourinary: Negative for dysuria.  Musculoskeletal: Negative for back pain.  Skin: Negative for rash.  Allergic/Immunologic: Negative for immunocompromised state.  Neurological: Positive for tremors. Negative for seizures, syncope, weakness and headaches.  Psychiatric/Behavioral: Negative for confusion, dysphoric mood, hallucinations and suicidal ideas.     Physical Exam Updated Vital Signs BP 109/63 (BP Location: Left Arm)   Pulse 70   Temp 97.7 F (36.5 C) (Oral)   Resp 18   SpO2 93%   Physical Exam Vitals signs and nursing note reviewed.  Constitutional:      Appearance: He is well-developed.     Comments: Appears much older than stated age  HENT:     Head:  Normocephalic.     Comments: Tolerating fluids without difficulty. Eyes:     Conjunctiva/sclera: Conjunctivae normal.  Neck:     Musculoskeletal: Neck supple.  Cardiovascular:     Rate and Rhythm: Normal rate and regular rhythm.     Pulses: Normal pulses.     Heart sounds: Normal heart sounds. No murmur. No friction rub. No gallop.   Pulmonary:     Effort: Pulmonary effort is normal. No respiratory distress.     Breath sounds: No stridor. No wheezing, rhonchi or rales.     Comments: Speaks in complete, fluent sentences without slurred speech. Chest:     Chest wall: No tenderness.  Abdominal:     General: There is no distension.     Palpations: Abdomen is soft.  Skin:    General: Skin is  warm and dry.  Neurological:     General: No focal deficit present.     Mental Status: He is alert.     Comments: Minimal tremor noted in the bilateral hands.  Gait is not ataxic.  Psychiatric:        Attention and Perception: He does not perceive auditory or visual hallucinations.        Behavior: Behavior normal.        Thought Content: Thought content does not include homicidal ideation. Thought content does not include homicidal plan.      ED Treatments / Results  Labs (all labs ordered are listed, but only abnormal results are displayed) Labs Reviewed  COMPREHENSIVE METABOLIC PANEL - Abnormal; Notable for the following components:      Result Value   Potassium 3.2 (*)    CO2 21 (*)    Total Protein 6.3 (*)    All other components within normal limits  ETHANOL - Abnormal; Notable for the following components:   Alcohol, Ethyl (B) 114 (*)    All other components within normal limits  ACETAMINOPHEN LEVEL - Abnormal; Notable for the following components:   Acetaminophen (Tylenol), Serum <10 (*)    All other components within normal limits  RAPID URINE DRUG SCREEN, HOSP PERFORMED - Abnormal; Notable for the following components:   Tetrahydrocannabinol POSITIVE (*)    All other  components within normal limits  SALICYLATE LEVEL  CBC    EKG None  Radiology No results found.  Procedures Procedures (including critical care time)  Medications Ordered in ED Medications  LORazepam (ATIVAN) injection 0-4 mg (has no administration in time range)    Or  LORazepam (ATIVAN) tablet 0-4 mg (has no administration in time range)  LORazepam (ATIVAN) injection 0-4 mg (has no administration in time range)    Or  LORazepam (ATIVAN) tablet 0-4 mg (has no administration in time range)  thiamine (VITAMIN B-1) tablet 100 mg (has no administration in time range)    Or  thiamine (B-1) injection 100 mg (has no administration in time range)  albuterol (VENTOLIN HFA) 108 (90 Base) MCG/ACT inhaler 1-2 puff (has no administration in time range)  gabapentin (NEURONTIN) capsule 300 mg (has no administration in time range)     Initial Impression / Assessment and Plan / ED Course  I have reviewed the triage vital signs and the nursing notes.  Pertinent labs & imaging results that were available during my care of the patient were reviewed by me and considered in my medical decision making (see chart for details).        63 year old male with a history of alcohol dependence, COPD, chronic hepatitis C, and depression who presents with complaints of suicidal ideation.  He endorsed suicidal ideation to triage staff, but denies because complaint to me but states that he would like to be seen by the behavioral health team because he is "concerned about his mental health."   On exam, lungs are clear to auscultation bilaterally.  Patient appears clinically sober as he is ambulating without ataxia and does not have slurred speech and is tolerating fluids without difficulty.  Ethanol level is 114.  UDS is positive for THC.  Labs are otherwise unremarkable.  Pt medically cleared at this time. Psych hold orders and home med orders placed. TTS consult pending; please see psych team notes for  further documentation of care/dispo. CIWA protocol has been ordered. Pt stable at time of med clearance.      Final Clinical  Impressions(s) / ED Diagnoses   Final diagnoses:  None    ED Discharge Orders    None       Joanne Gavel, PA-C 06/14/19 0815    Mesner, Corene Cornea, MD 06/15/19 [phone]

## 2019-06-14 NOTE — BH Assessment (Signed)
[facility] Assessment Progress Note  Per Jacqueline Norman, DO, this pt does not require psychiatric hospitalization at this time.  Pt is to be discharged from [facility] with recommendation to follow up with [facility].  This has been included in pt's discharge instructions.  Pt would also benefit from seeing Peer Support Specialists; they will be asked to speak to pt.  Pt's nurse, Janie, has been notified.  Tim Blizzard, MA Triage Specialist [phone]     

## 2019-06-14 NOTE — ED Notes (Signed)
Pt changed into purple scrubs 

## 2019-06-14 NOTE — Discharge Instructions (Signed)
For your behavioral health needs you are advised to follow up with [facility].  New patients are seen at their walk-in clinic.  Walk-in hours are Monday - Friday from 8:30 am - 12:00 pm, and from 1:00 pm - 2:30 pm.  Walk-in patients are seen on a first come, first served basis, so try to arrive as early as possible for the best chance of being seen the same day:       [facility]      [address]      [place], [place] [address]      [phone] 

## 2019-06-14 NOTE — ED Notes (Signed)
Up to the bathroom 

## 2019-06-14 NOTE — Patient Outreach (Signed)
CPSS met with the patient in order to provide substance use recovery support and help with getting connected to substance use treatment resources. Patient reports a history of alcohol use. Patient is interested in residential substance use treatment. CPSS talked to the patient about available residential treatment centers provide both detox and residential substance use treatment along with information for resources that only provide separate detox or residential treatment services. Patient provided follow up information these specific resources as well as information for other substance use recovery resources. Some of these other substance use recovery resources include outpatient substance use treatment center list, AA meeting list for the [place] area, and CPSS contact information. CPSS strongly encouraged the patient to follow up with CPSS if needed for further help with getting connected to substance use treatment resources.

## 2019-06-14 NOTE — ED Notes (Signed)
Pt ambulatory w/o difficulty to room 37, wanded PTA

## 2019-06-14 NOTE — ED Notes (Addendum)
Written dc instructions reviewed with pt.  Pt encouraged to go to [facility] services for follow up as soon as possible and to contact [facility] for follow up.  Pt also encouraged to seek treatment/return for any changes or concerns.  Pt verbalized understanding.  Pt ambulatory to dc area with mHt, belongings returned after leaving the area.

## 2019-06-14 NOTE — ED Triage Notes (Signed)
Pt reports suicidal ideation and would like help with alcoholism. Pt is intoxicated.

## 2020-01-17 ENCOUNTER — Other Ambulatory Visit: Payer: Self-pay

## 2020-01-17 ENCOUNTER — Other Ambulatory Visit (HOSPITAL_COMMUNITY): Payer: Self-pay | Admitting: Internal Medicine

## 2020-01-17 ENCOUNTER — Ambulatory Visit (HOSPITAL_COMMUNITY)
Admission: RE | Admit: 2020-01-17 | Discharge: 2020-01-17 | Disposition: A | Discharge status: Home / Self Care | Payer: Medicaid Other | Source: Ambulatory Visit | Attending: Internal Medicine | Admitting: Internal Medicine

## 2020-01-17 DIAGNOSIS — J42 Unspecified chronic bronchitis: Secondary | ICD-10-CM | POA: Diagnosis not present

## 2020-01-17 DIAGNOSIS — J439 Emphysema, unspecified: Secondary | ICD-10-CM | POA: Diagnosis not present

## 2020-02-14 ENCOUNTER — Observation Stay (HOSPITAL_COMMUNITY)
Admission: RE | Admit: 2020-02-14 | Discharge: 2020-02-14 | Disposition: A | Discharge status: Home / Self Care | Payer: Medicaid Other | Attending: Psychiatry | Admitting: Psychiatry

## 2020-02-14 ENCOUNTER — Other Ambulatory Visit: Payer: Self-pay

## 2020-02-14 ENCOUNTER — Observation Stay (HOSPITAL_COMMUNITY)
Admission: AD | Admit: 2020-02-14 | Discharge: 2020-02-14 | Disposition: A | Discharge status: Home / Self Care | Payer: Medicaid Other | Source: Intra-hospital | Attending: Psychiatry | Admitting: Psychiatry

## 2020-02-14 ENCOUNTER — Encounter (HOSPITAL_COMMUNITY): Payer: Self-pay

## 2020-02-14 ENCOUNTER — Emergency Department (HOSPITAL_COMMUNITY)
Admission: EM | Admit: 2020-02-14 | Discharge: 2020-02-14 | Disposition: A | Discharge status: Other Acute Inpatient Hospital | Payer: Medicaid Other | Attending: Emergency Medicine | Admitting: Emergency Medicine

## 2020-02-14 ENCOUNTER — Encounter (HOSPITAL_COMMUNITY): Payer: Self-pay | Admitting: Psychiatry

## 2020-02-14 DIAGNOSIS — F419 Anxiety disorder, unspecified: Secondary | ICD-10-CM | POA: Diagnosis not present

## 2020-02-14 DIAGNOSIS — F329 Major depressive disorder, single episode, unspecified: Principal | ICD-10-CM | POA: Insufficient documentation

## 2020-02-14 DIAGNOSIS — Z79899 Other long term (current) drug therapy: Secondary | ICD-10-CM | POA: Diagnosis not present

## 2020-02-14 DIAGNOSIS — F1994 Other psychoactive substance use, unspecified with psychoactive substance-induced mood disorder: Secondary | ICD-10-CM

## 2020-02-14 DIAGNOSIS — R4781 Slurred speech: Secondary | ICD-10-CM | POA: Diagnosis not present

## 2020-02-14 DIAGNOSIS — F410 Panic disorder [episodic paroxysmal anxiety] without agoraphobia: Secondary | ICD-10-CM | POA: Diagnosis not present

## 2020-02-14 DIAGNOSIS — Y904 Blood alcohol level of 80-99 mg/100 ml: Secondary | ICD-10-CM | POA: Insufficient documentation

## 2020-02-14 DIAGNOSIS — F17210 Nicotine dependence, cigarettes, uncomplicated: Secondary | ICD-10-CM | POA: Insufficient documentation

## 2020-02-14 DIAGNOSIS — F1010 Alcohol abuse, uncomplicated: Secondary | ICD-10-CM | POA: Insufficient documentation

## 2020-02-14 DIAGNOSIS — Z20822 Contact with and (suspected) exposure to covid-19: Secondary | ICD-10-CM | POA: Diagnosis not present

## 2020-02-14 DIAGNOSIS — J449 Chronic obstructive pulmonary disease, unspecified: Secondary | ICD-10-CM | POA: Insufficient documentation

## 2020-02-14 DIAGNOSIS — F332 Major depressive disorder, recurrent severe without psychotic features: Secondary | ICD-10-CM | POA: Diagnosis present

## 2020-02-14 LAB — COMPREHENSIVE METABOLIC PANEL
ALT: 17 U/L (ref 0–44)
AST: 19 U/L (ref 15–41)
Albumin: 4.3 g/dL (ref 3.5–5.0)
Alkaline Phosphatase: 59 U/L (ref 38–126)
Anion gap: 10 (ref 5–15)
BUN: 13 mg/dL (ref 8–23)
CO2: 24 mmol/L (ref 22–32)
Calcium: 10.2 mg/dL (ref 8.9–10.3)
Chloride: 106 mmol/L (ref 98–111)
Creatinine, Ser: 1.17 mg/dL (ref 0.61–1.24)
GFR calc Af Amer: >60 mL/min (ref >60)
GFR calc non Af Amer: >60 mL/min (ref >60)
Glucose, Bld: 86 mg/dL (ref 70–99)
Potassium: 3.8 mmol/L (ref 3.5–5.1)
Sodium: 140 mmol/L (ref 135–145)
Total Bilirubin: 0.8 mg/dL (ref 0.3–1.2)
Total Protein: 7.2 g/dL (ref 6.5–8.1)

## 2020-02-14 LAB — CBC
HCT: 49.5 % (ref 39.0–52.0)
Hemoglobin: 16.7 g/dL (ref 13.0–17.0)
MCH: 31.5 pg (ref 26.0–34.0)
MCHC: 33.7 g/dL (ref 30.0–36.0)
MCV: 93.4 fL (ref 80.0–100.0)
Platelets: 241 10*3/uL (ref 150–400)
RBC: 5.3 MIL/uL (ref 4.22–5.81)
RDW: 13.2 % (ref 11.5–15.5)
WBC: 7.5 10*3/uL (ref 4.0–10.5)
nRBC: 0 % (ref 0.0–0.2)

## 2020-02-14 LAB — RAPID URINE DRUG SCREEN, HOSP PERFORMED
Amphetamines: NOT DETECTED
Barbiturates: NOT DETECTED
Benzodiazepines: NOT DETECTED
Cocaine: NOT DETECTED
Opiates: NOT DETECTED
Tetrahydrocannabinol: POSITIVE — AB

## 2020-02-14 LAB — RESPIRATORY PANEL BY RT PCR (FLU A&B, COVID)
Influenza A by PCR: NEGATIVE
Influenza B by PCR: NEGATIVE
SARS Coronavirus 2 by RT PCR: NEGATIVE

## 2020-02-14 LAB — SALICYLATE LEVEL: Salicylate Lvl: <7 mg/dL — ABNORMAL LOW (ref 7.0–30.0)

## 2020-02-14 LAB — ACETAMINOPHEN LEVEL: Acetaminophen (Tylenol), Serum: <10 ug/mL — ABNORMAL LOW (ref 10–30)

## 2020-02-14 LAB — ETHANOL: Alcohol, Ethyl (B): 92 mg/dL — ABNORMAL HIGH (ref <10)

## 2020-02-14 MED ORDER — HYDROXYZINE HCL 25 MG PO TABS: 25.0000 mg | ORAL_TABLET | Freq: Three times a day (TID) | ORAL | Status: DC | PRN

## 2020-02-14 MED ORDER — ONDANSETRON 4 MG PO TBDP: 4.0000 mg | ORAL_TABLET | Freq: Four times a day (QID) | ORAL | Status: DC | PRN

## 2020-02-14 MED ORDER — LOPERAMIDE HCL 2 MG PO CAPS: 2.0000 mg | ORAL_CAPSULE | ORAL | Status: DC | PRN

## 2020-02-14 MED ORDER — LORAZEPAM 1 MG PO TABS: 1.0000 mg | ORAL_TABLET | Freq: Four times a day (QID) | ORAL | Status: DC | PRN

## 2020-02-14 MED ORDER — TRAZODONE HCL 50 MG PO TABS: 50.0000 mg | ORAL_TABLET | Freq: Every evening | ORAL | Status: DC | PRN

## 2020-02-14 MED ORDER — ACETAMINOPHEN 325 MG PO TABS: 650.0000 mg | ORAL_TABLET | Freq: Four times a day (QID) | ORAL | Status: DC | PRN

## 2020-02-14 MED ORDER — TRAZODONE HCL 50 MG PO TABS: 50.0000 mg | ORAL_TABLET | Freq: Every evening | ORAL | 0 refills | Status: DC | PRN

## 2020-02-14 MED ORDER — MAGNESIUM HYDROXIDE 400 MG/5ML PO SUSP: 30.0000 mL | Freq: Every day | ORAL | Status: DC | PRN

## 2020-02-14 MED ORDER — THIAMINE HCL 100 MG PO TABS: 100.0000 mg | ORAL_TABLET | Freq: Every day | ORAL | Status: DC

## 2020-02-14 MED ORDER — ALUM & MAG HYDROXIDE-SIMETH 200-200-20 MG/5ML PO SUSP: 30.0000 mL | ORAL | Status: DC | PRN

## 2020-02-14 MED ORDER — PNEUMOCOCCAL VAC POLYVALENT 25 MCG/0.5ML IJ INJ
0.5000 mL | INJECTION | INTRAMUSCULAR | Status: DC
  Filled 2020-02-14: qty 0.5

## 2020-02-14 MED ORDER — ADULT MULTIVITAMIN W/MINERALS CH
1.0000 | ORAL_TABLET | Freq: Every day | ORAL | Status: DC
  Administered 2020-02-14: 12:00:00 1 via ORAL
  Filled 2020-02-14: qty 1

## 2020-02-14 NOTE — ED Triage Notes (Signed)
Pt wants help for alcoholism. Checked into [facility] for detox and was sent here for medical clearance. Has a bed once discharged.

## 2020-02-14 NOTE — Progress Notes (Signed)
Patient ID: Tim [name], male   DOB: Nov 25, 1955, 64 y.o.   MRN: [id number] Patient presents voluntarily secondary to increased depression. Reports that he has been using alcohol on daily basis. Reports that he drinks more than 12 beers a day and unable to control this habit. Reports history of multiple health issues including COPD and Hep C. He currently denies suicidal thoughts but  Sad, tired and depressed, disheveled. CIWA= 4. He is anxious and helpless but cooperative with assessment. Patient is admitted to the observation unit for further evaluation. MD to evaluate today.

## 2020-02-14 NOTE — Discharge Summary (Signed)
Suicide Risk Assessment  Discharge Assessment   [facility] Discharge Suicide Risk Assessment   Principal Problem: Substance induced mood disorder [facility]) Discharge Diagnoses: Principal Problem:   Substance induced mood disorder (HCC) Active Problems:   MDD (major depressive disorder), recurrent severe, without psychosis (HCC)  Patient seen for face to face evaluation, chart reviewed and discussed with Dr. Lucianne Muss on 02/14/2020. Patient reports a history for depression, exacerbated by alcohol usage.  States he came to the ED for medication adjustment because he feels his medication for depression is not working for him.  He receives services at [facility], states he last visit was ~3 months ago and states he is due for a follow-up.   His mood is improved today as he is calm, pleasant and willing to communicate with this Clinical research associate.  He denies suicidal or homicidal ideations/plan.  He denies audible or visual hallucinations and does not appear internally preoccupied.    Total Time spent with patient: 30 minutes  Musculoskeletal: Strength & Muscle Tone: within normal limits Gait & Station: normal Patient leans: did not assess, patient sitting on the bed during eval  Psychiatric Specialty Exam:   Blood pressure 123/90, pulse 85, temperature 98.1 F (36.7 C), temperature source Oral, resp. rate 18, SpO2 98 %.There is no height or weight on file to calculate BMI.  General Appearance: Casual and overgrown beard and mustache, wearing hospital scrubs  Eye Contact::  Good  Speech:  Normal Rate and mumbles  Volume:  Normal  Mood:  Depressed  Affect:  Appropriate and Congruent  Thought Process:  Coherent and Descriptions of Associations: Intact  Orientation:  Full (Time, Place, and Person)  Thought Content:  Logical  Suicidal Thoughts:  No  Homicidal Thoughts:  No  Memory:  NA Immediate;   Good Recent;   Good Remote;   Fair  Judgement:  Other:  Fair is the setting of chronic alcohol use  Insight:  Fair   Psychomotor Activity:  Normal  Concentration:  Fair  Recall:  Good  Fund of Knowledge:Good  Language: Good  Akathisia:  NA  Handed:  Right  AIMS (if indicated):     Assets:  Desire for Improvement Housing  Sleep:   <6 hours  Cognition: WNL  ADL's:  Intact   Mental Status Per Nursing Assessment::   On Admission:  NA  Demographic Factors:  Male and Caucasian  Loss Factors: NA  Historical Factors: Impulsivity  Risk Reduction Factors:   Living with another person, especially a relative and Positive social support  Continued Clinical Symptoms:  Alcohol/Substance Abuse/Dependencies  Cognitive Features That Contribute To Risk:  None    Suicide Risk:  Mild:  Suicidal ideation of limited frequency, intensity, duration, and specificity.  There are no identifiable plans, no associated intent, mild dysphoria and related symptoms, good self-control (both objective and subjective assessment), few other risk factors, and identifiable protective factors, including available and accessible social support.   Plan Of Care/Follow-up recommendations:  Plan- As per above assessment , there are no current grounds for involuntary commitment at this time. Patient is not currently interested in inpatient services, but expresses agreement to continue outpatient treatment with [facility].  Information has been shared with Doylene Canning to assist with coordinating follow-up visit.   We have reviewed importance of substance abuse abstinence, potential negative impact substance abuse can have on his relationships and level of functioning, and importance of medication compliance.     Chales Abrahams, NP 02/14/2020, 12:51 PM

## 2020-02-14 NOTE — ED Provider Notes (Signed)
[facility] DEPT Provider Note   CSN: [id number] Arrival date & time: 02/14/20  0409     History Chief Complaint  Patient presents with  . Medical Clearance    Tim [name] is a 64 y.o. male.  The history is provided by the patient and medical records.   64 y.o. M with hx of alcoholism, COPD, anxiety, depression, hep C, scoliosis, thyroid disease, presenting to the ED for medical clearance.  Patient reports he needs help with this drinking.  He drinks upwards of 12-18 beers on a daily basis.  He does report worsening depression and anxiety, feels like his meds aren't working like they should and need to be adjusted.  He is followed by [facility].  He was seen at [facility] earlier tonight.  He initially denied SI/HI/AVH but once clinical at [facility] discussed possible discharged he became upset and stated he did not feel safe being alone.   Patient requiring medical clearance here, he may return to their Obs unit once medically cleared.  Past Medical History:  Diagnosis Date  . Alcoholism ([place])   . Anxiety   . COPD (chronic obstructive pulmonary disease) ([place])   . Depression   . Hep C w/o coma, chronic ([place])   . History of hiatal hernia    hernia rt groin  . Mental health disorder   . Scoliosis   . Thyroid disease     Patient Active Problem List   Diagnosis Date Noted  . MDD (major depressive disorder), severe ([place]) 04/10/2019  . 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 Cocaine abuse with cocaine-induced mood disorder ([place]) 01/29/2018  . Polysubstance dependence ([place]) 01/29/2018  . Severe recurrent major depression without psychotic features ([place]) 08/20/2017  . Severe episode of recurrent major depressive disorder, without psychotic features ([place]) 07/08/2016  . MDD (major depressive disorder), recurrent severe, without psychosis ([place]) 05/23/2016  . Hepatitis C 05/23/2016  . Alcohol use disorder, severe,  dependence ([place]) 11/05/2015  . Depression, major, recurrent, moderate ([place]) 11/05/2015  . GAD (generalized anxiety disorder) 11/05/2015  . Cellulitis of right lower extremity 03/29/2015    Past Surgical History:  Procedure Laterality Date  . SKIN CANCER EXCISION    . THYROID SURGERY         Family History  Problem Relation Age of Onset  . Dementia Mother     Social History   Tobacco Use  . Smoking status: Current Every Day Smoker    Packs/day: 1.00    Years: 40.00    Pack years: 40.00    Types: Cigarettes  . Smokeless tobacco: Never Used  Substance Use Topics  . Alcohol use: Yes    Comment: "6 pack a day"  . Drug use: Yes    Types: Cocaine, Marijuana    Comment: also states will take pain pills every now and then, last use of cocaine and ETOH today at 1400    Home Medications Prior to Admission medications   Medication Sig Start Date End Date Taking? Authorizing Provider  acamprosate (CAMPRAL) 333 MG tablet Take 2 tablets (666 mg total) by mouth 3 (three) times daily. 04/15/19   Johnn Hai, MD  albuterol (VENTOLIN HFA) 108 (90 Base) MCG/ACT inhaler Inhale 1-2 puffs into the lungs every 6 (six) hours as needed for wheezing or shortness of breath. 05/30/19   Varney Biles, MD  aspirin 325 MG EC tablet Take 325 mg by mouth daily as needed for pain.    [provider]  doxycycline (VIBRAMYCIN) 100 MG capsule Take 1 capsule (100 mg total) by mouth 2 (two) times daily. 05/30/19   Derwood Kaplan, MD  escitalopram (LEXAPRO) 20 MG tablet Take 1 tablet (20 mg total) by mouth daily. 04/15/19   Malvin Johns, MD  gabapentin (NEURONTIN) 300 MG capsule Take 1 capsule (300 mg total) by mouth 3 (three) times daily. 11/10/18   Charm Rings, NP  hydrOXYzine (ATARAX/VISTARIL) 25 MG tablet Take 25 mg by mouth every 6 (six) hours as needed for anxiety.    [provider]  predniSONE (DELTASONE) 10 MG tablet Take 5 tablets (50 mg total) by mouth daily. 05/30/19   Derwood Kaplan, MD  risperiDONE (RISPERDAL) 0.25 MG tablet Take 0.25 mg by mouth 2 (two) times daily.    [provider]  traZODone (DESYREL) 100 MG tablet Take 1 tablet (100 mg total) at bedtime by mouth. For sleep Patient taking differently: Take 100 mg by mouth at bedtime as needed for sleep.  08/24/17   Money, Gerlene Burdock, FNP    Allergies    Patient has no known allergies.  Review of Systems   Review of Systems  Psychiatric/Behavioral:       Depression, detox  All other systems reviewed and are negative.   Physical Exam Updated Vital Signs BP 110/75 (BP Location: Left Arm)   Pulse 89   Temp 98.4 F (36.9 C) (Oral)   Resp 18   Ht 6\' 3"  (1.905 m)   Wt 79.8 kg   SpO2 95%   BMI 21.99 kg/m   Physical Exam Vitals and nursing note reviewed.  Constitutional:      Appearance: He is well-developed.  HENT:     Head: Normocephalic and atraumatic.  Eyes:     Conjunctiva/sclera: Conjunctivae normal.     Pupils: Pupils are equal, round, and reactive to light.  Cardiovascular:     Rate and Rhythm: Normal rate and regular rhythm.     Heart sounds: Normal heart sounds.  Pulmonary:     Effort: Pulmonary effort is normal.     Breath sounds: Normal breath sounds. No stridor. No wheezing.  Abdominal:     General: Bowel sounds are normal.     Palpations: Abdomen is soft.     Tenderness: There is no abdominal tenderness. There is no rebound.  Musculoskeletal:        General: Normal range of motion.     Cervical back: Normal range of motion.  Skin:    General: Skin is warm and dry.  Neurological:     Mental Status: He is alert and oriented to person, place, and time.     ED Results / Procedures / Treatments   Labs (all labs ordered are listed, but only abnormal results are displayed) Labs Reviewed  ETHANOL - Abnormal; Notable for the following components:      Result Value   Alcohol, Ethyl (B) 92 (*)    All other components within normal limits  SALICYLATE LEVEL - Abnormal;  Notable for the following components:   Salicylate Lvl <7.0 (*)    All other components within normal limits  ACETAMINOPHEN LEVEL - Abnormal; Notable for the following components:   Acetaminophen (Tylenol), Serum <10 (*)    All other components within normal limits  RAPID URINE DRUG SCREEN, HOSP PERFORMED - Abnormal; Notable for the following components:   Tetrahydrocannabinol POSITIVE (*)    All other components within normal limits  RESPIRATORY PANEL BY RT PCR (FLU A&B, COVID)  COMPREHENSIVE METABOLIC PANEL  CBC    EKG None  Radiology No results found.  Procedures Procedures (including critical care time)  Medications Ordered in ED Medications - No data to display  ED Course  I have reviewed the triage vital signs and the nursing notes.  Pertinent labs & imaging results that were available during my care of the patient were reviewed by me and considered in my medical decision making (see chart for details).    MDM Rules/Calculators/A&P  64 year old male initially presenting to [facility] for detox from alcohol.  Once discussion had about possible discharge, he became upset and stated he did not feel safe on his own given his current mental state.  He is recommended for overnight observation.  He was sent to the ED for medical clearance and has bed assigned if labs reassuring.  He has no physical complaints at this time.  His vitals are stable.  Screening labs are reassuring, ethanol 92 and UDS + for THC.  His Covid screen is negative.  Patient is medically cleared.  Will transport back to [facility] obs unit for ongoing care.  Final Clinical Impression(s) / ED Diagnoses Final diagnoses:  Alcohol abuse    Rx / DC Orders ED Discharge Orders    None       Garlon Hatchet, PA-C 02/14/20 0557    Glynn Octave, MD 02/14/20 [phone]

## 2020-02-14 NOTE — Progress Notes (Signed)
Patient stayed in bed and was out in the dayroom for lunch. Calm and cooperative. Denies suicidal thoughts.  Reports that he has a place to stay at, that he follows up at [facility]. Patient is to be discharged soon. MD was contacted for discharge medication reconciliation. Pt will be discharged thereafter.
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
## 2020-02-14 NOTE — BH Assessment (Addendum)
BHH Assessment Progress Note  Per Ophelia Shoulder, FNP, this pt does not require psychiatric hospitalization at this time.  Pt is to be discharged from the [facility] Observ[facility] Unit with recommendation to continue treatment with [facility].  While it is preferred for pt to have a follow up appointment included in pt's discharge instructions, pt is to be discharged no later than 12:30.  Generic recommendation to continue treatment with [facility] has been included in pt's discharge instructions.  At 11:50 this Film/video editor.  Call rolled to voice mail and I left a message requesting return call with appointment.  As of this writing return call is pending.  Pt's nurse, Delorse Limber, has been notified.  Doylene Canning, MA Triage Specialist [phone]   Addendum:  [facility] calls back.  Pt has been scheduled for a telephonic follow up appointment on Monday 02/17/2020 at 10:30.  This has been included in pt's discharge instructions.  Pt's nurse, Ander Slade, has been notified.  Doylene Canning, [place] Behavioral Health Coordinator [phone]

## 2020-02-14 NOTE — H&P (Signed)
Behavioral Health Medical Screening Exam  Tim [name] is an 64 y.o. male who presents to [facility] as a walk in unaccompanied for depression and needing detox services. Pt states "I want to get my mind right". Pt reports he feels like his medication needs adjustment. Pt reports he feels stick with his depression and anxiety. He denies current SI, HI, self haem, AVH, paranoia and prior suicidal attempt. Pt denies access to guns or weapons. He endorses 2 joints of daily marijuana use and last used yesterday. States he drinks 8-12 beers daily and last use was 18 beers this morning at 1am since the the past 24 hours. He reports having panic attacks and racing thought that interferes with his sleep. Pt talks about being bipolar throughout assessment. He states he sleeps 4-6 hours daily and has a fair appetite. He denies any pending court cases. Pt states she gets outpatient services from [place]. Pt lives with his 68 year old mother in a trailer.   During evaluation pt is sitting; he is alert/oriented x 4; cooperative; and mood is depressed/anxious congruent with affect. Pt is speaking in a clear tone at moderate volume, and normal pace; with good eye contact. His thought process is coherent and relevant but scattered; thought content is tangential. There is no indication that he is currently responding to internal/external stimuli or experiencing delusional thought content. Pt's insight and judgement is fair, impulse control is poor.   Pt was told he did not meet criteria and he became agitated and started yelling that he needed help. He reports he is suicidal and homicidal and does not feel safe being by himself now because of his mental health. Pt was redirected and calmed. Pt was sent over to [facility] for medical clearance.  Total Time spent with patient: 30 minutes  Psychiatric Specialty Exam: Physical Exam  Constitutional: He is oriented to person, place, and time. He appears well-developed and  well-nourished.  HENT:  Head: Normocephalic.  Eyes: Pupils are equal, round, and reactive to light.  Respiratory: Effort normal and breath sounds normal.  Musculoskeletal:        General: Normal range of motion.     Cervical back: Normal range of motion.  Neurological: He is alert and oriented to person, place, and time.  Skin: Skin is warm and dry.  Psychiatric: His behavior is normal. Judgment normal. His mood appears anxious. His speech is slurred. Cognition and memory are normal. He exhibits a depressed mood. He expresses suicidal ideation.    Review of Systems  All other systems reviewed and are negative.   Blood pressure 94/76, pulse 81, temperature 98.2 F (36.8 C), temperature source Oral, resp. rate 20, SpO2 97 %.There is no height or weight on file to calculate BMI.  General Appearance: Casual  Eye Contact:  Minimal  Speech:  Slurred  Volume:  Normal  Mood:  Anxious and Depressed  Affect:  Congruent and Depressed  Thought Process:  Coherent and Descriptions of Associations: Tangential  Orientation:  Full (Time, Place, and Person)  Thought Content:  Logical  Suicidal Thoughts:  Yes.  without intent/plan  Homicidal Thoughts:  No  Memory:  Recent;   Fair  Judgement:  Poor  Insight:  Fair  Psychomotor Activity:  Normal  Concentration: Concentration: Fair  Recall:  Fair  Fund of Knowledge:Good  Language: Good  Akathisia:  No  Handed:  Right  AIMS (if indicated):     Assets:  Communication Skills Desire for Improvement Financial Resources/Insurance Housing Social Support  Sleep:       Musculoskeletal: Strength & Muscle Tone: within normal limits Gait & Station: normal Patient leans: N/A  Blood pressure 94/76, pulse 81, temperature 98.2 F (36.8 C), temperature source Oral, resp. rate 20, SpO2 97 %.  Recommendations:  Based on my evaluation the patient does not appear to have an emergency medical condition.   Disposition:  Recommend overnight observation  and stabilization when medically cleared. Supportive therapy provided about ongoing stressors.  Mliss Fritz, NP 02/14/2020, 6:50 AM

## 2020-02-14 NOTE — Progress Notes (Signed)
Patient discharged per MD order. Discharge instructions given to patient: patient verbalizes understanding. Will follow up with his regular outpatient provider [facility]). Patient denies suicidal upon discharge. Belongings returned. No sign of distress noted.

## 2020-02-14 NOTE — ED Notes (Signed)
Pt sts he drank a 12pk of budlight today.

## 2020-02-14 NOTE — Discharge Instructions (Addendum)
For your mental health needs, you are advised to continue treatment with [facility]. You have a telephonic follow up appointment scheduled for Monday, Feb 17, 2020 at 10:30 am:       [facility]      [address]      [place], [place] [address]      [phone]      Crisis number: [phone]

## 2020-02-24 ENCOUNTER — Emergency Department (HOSPITAL_COMMUNITY): Payer: Medicaid Other

## 2020-02-24 ENCOUNTER — Other Ambulatory Visit: Payer: Self-pay

## 2020-02-24 ENCOUNTER — Emergency Department (HOSPITAL_COMMUNITY)
Admission: EM | Admit: 2020-02-24 | Discharge: 2020-02-24 | Disposition: A | Discharge status: Home / Self Care | Payer: Medicaid Other | Attending: Emergency Medicine | Admitting: Emergency Medicine

## 2020-02-24 ENCOUNTER — Encounter (HOSPITAL_COMMUNITY): Payer: Self-pay | Admitting: Emergency Medicine

## 2020-02-24 DIAGNOSIS — J449 Chronic obstructive pulmonary disease, unspecified: Secondary | ICD-10-CM | POA: Insufficient documentation

## 2020-02-24 DIAGNOSIS — R0602 Shortness of breath: Secondary | ICD-10-CM | POA: Diagnosis not present

## 2020-02-24 DIAGNOSIS — Z20822 Contact with and (suspected) exposure to covid-19: Secondary | ICD-10-CM | POA: Insufficient documentation

## 2020-02-24 DIAGNOSIS — R0689 Other abnormalities of breathing: Secondary | ICD-10-CM | POA: Diagnosis not present

## 2020-02-24 DIAGNOSIS — F17210 Nicotine dependence, cigarettes, uncomplicated: Secondary | ICD-10-CM | POA: Diagnosis not present

## 2020-02-24 DIAGNOSIS — I959 Hypotension, unspecified: Secondary | ICD-10-CM | POA: Diagnosis not present

## 2020-02-24 LAB — RAPID URINE DRUG SCREEN, HOSP PERFORMED
Amphetamines: NOT DETECTED
Barbiturates: NOT DETECTED
Benzodiazepines: NOT DETECTED
Cocaine: NOT DETECTED
Opiates: NOT DETECTED
Tetrahydrocannabinol: POSITIVE — AB

## 2020-02-24 LAB — SARS CORONAVIRUS 2 BY RT PCR (HOSPITAL ORDER, PERFORMED IN ~~LOC~~ HOSPITAL LAB): SARS Coronavirus 2: NEGATIVE

## 2020-02-24 LAB — BASIC METABOLIC PANEL
Anion gap: 11 (ref 5–15)
BUN: 8 mg/dL (ref 8–23)
CO2: 20 mmol/L — ABNORMAL LOW (ref 22–32)
Calcium: 10 mg/dL (ref 8.9–10.3)
Chloride: 109 mmol/L (ref 98–111)
Creatinine, Ser: 1.12 mg/dL (ref 0.61–1.24)
GFR calc Af Amer: >60 mL/min (ref >60)
GFR calc non Af Amer: >60 mL/min (ref >60)
Glucose, Bld: 84 mg/dL (ref 70–99)
Potassium: 3.5 mmol/L (ref 3.5–5.1)
Sodium: 140 mmol/L (ref 135–145)

## 2020-02-24 LAB — TROPONIN I (HIGH SENSITIVITY)
Troponin I (High Sensitivity): 3 ng/L (ref <18)
Troponin I (High Sensitivity): 3 ng/L (ref <18)

## 2020-02-24 LAB — CBC
HCT: 47.3 % (ref 39.0–52.0)
Hemoglobin: 16 g/dL (ref 13.0–17.0)
MCH: 31.4 pg (ref 26.0–34.0)
MCHC: 33.8 g/dL (ref 30.0–36.0)
MCV: 92.7 fL (ref 80.0–100.0)
Platelets: 199 10*3/uL (ref 150–400)
RBC: 5.1 MIL/uL (ref 4.22–5.81)
RDW: 13.1 % (ref 11.5–15.5)
WBC: 5.1 10*3/uL (ref 4.0–10.5)
nRBC: 0 % (ref 0.0–0.2)

## 2020-02-24 LAB — URINALYSIS, ROUTINE W REFLEX MICROSCOPIC
Bilirubin Urine: NEGATIVE
Glucose, UA: NEGATIVE mg/dL
Hgb urine dipstick: NEGATIVE
Ketones, ur: NEGATIVE mg/dL
Leukocytes,Ua: NEGATIVE
Nitrite: NEGATIVE
Protein, ur: NEGATIVE mg/dL
Specific Gravity, Urine: 1.006 (ref 1.005–1.030)
pH: 5 (ref 5.0–8.0)

## 2020-02-24 LAB — HEPATIC FUNCTION PANEL
ALT: 17 U/L (ref 0–44)
AST: 18 U/L (ref 15–41)
Albumin: 3.5 g/dL (ref 3.5–5.0)
Alkaline Phosphatase: 51 U/L (ref 38–126)
Bilirubin, Direct: <0.1 mg/dL (ref 0.0–0.2)
Total Bilirubin: 0.5 mg/dL (ref 0.3–1.2)
Total Protein: 5.9 g/dL — ABNORMAL LOW (ref 6.5–8.1)

## 2020-02-24 LAB — ETHANOL: Alcohol, Ethyl (B): 12 mg/dL — ABNORMAL HIGH (ref <10)

## 2020-02-24 LAB — AMMONIA: Ammonia: 43 umol/L — ABNORMAL HIGH (ref 9–35)

## 2020-02-24 MED ORDER — ALBUTEROL SULFATE HFA 108 (90 BASE) MCG/ACT IN AERS: 1.0000 | INHALATION_SPRAY | Freq: Four times a day (QID) | RESPIRATORY_TRACT | 1 refills | Status: DC | PRN

## 2020-02-24 MED ORDER — METHYLPREDNISOLONE SODIUM SUCC 125 MG IJ SOLR
125.0000 mg | Freq: Once | INTRAMUSCULAR | Status: AC
  Administered 2020-02-24: 12:00:00 125 mg via INTRAVENOUS
  Filled 2020-02-24: qty 2

## 2020-02-24 MED ORDER — SODIUM CHLORIDE 0.9 % IV BOLUS
1000.0000 mL | Freq: Once | INTRAVENOUS | Status: AC
  Administered 2020-02-24: 1000 mL via INTRAVENOUS

## 2020-02-24 MED ORDER — ALBUTEROL SULFATE HFA 108 (90 BASE) MCG/ACT IN AERS
2.0000 | INHALATION_SPRAY | Freq: Once | RESPIRATORY_TRACT | Status: AC
  Administered 2020-02-24: 2 via RESPIRATORY_TRACT
  Filled 2020-02-24: qty 6.7

## 2020-02-24 MED ORDER — METHYLPREDNISOLONE 4 MG PO TBPK
ORAL_TABLET | ORAL | 0 refills | Status: DC
Start: 2020-02-24 — End: 2021-09-02

## 2020-02-24 NOTE — ED Notes (Signed)
Patient verbalizes understanding of discharge instructions. Opportunity for questioning and answers were provided. Armband removed by staff, pt discharged from ED ambulatory.   

## 2020-02-24 NOTE — ED Notes (Signed)
Pt ambulatory without difficulty. Gait steady and even. Oxygen saturations remained 94% or higher while ambulating. Respirations even and unlabored.

## 2020-02-24 NOTE — ED Triage Notes (Signed)
Pt brought to ED by [facility] from home for c/o SOB since 4 am getting worse now after he was drinking 12 beers and weed all night long. Hx of COPD. BP 94/54 after 500 mL NS given by EMS, HR 84, R 25, SPO2 97% RA.

## 2020-02-24 NOTE — ED Provider Notes (Signed)
Tim [name]   CSN: [id number] Arrival date & time: 02/24/20  0109     History Chief Complaint  Patient presents with  . Shortness of Breath    Tim [name] is a 64 y.o. male.  The history is provided by the patient and medical records.  Shortness of Breath Severity:  Moderate Onset quality:  Gradual Duration:  2 hours Progression:  Improving Chronicity:  Recurrent Context comment:  Drinking alcohol and smoking Marijuana all night Exacerbated by: unsure. Associated symptoms: wheezing   Associated symptoms: no abdominal pain, no chest pain, no claudication, no cough, no diaphoresis, no ear pain, no fever, no headaches, no hemoptysis, no neck pain, no PND, no rash, no sore throat, no sputum production, no syncope, no swollen glands and no vomiting   Risk factors: alcohol use and tobacco use   Patient states that he was up drinking and smoking marijuana all night and began feeling short of breath states that it gradually worsened over the course of the evening.  He does admit to some wheezing.  He denies a history of PND or orthopnea.  He denies a history of heart failure.  He is unsure if EMS gave him any treatment however triage [name] states that the patient was found to be mildly hypertensive and given 500 mL of fluid prior to arrival.  He states that his shortness of breath has improved significantly.  He is not on any oxygen at home.  He denies fevers, cough, exposure to Covid.     Past Medical History:  Diagnosis Date  . Alcoholism (HCC)   . Anxiety   . COPD (chronic obstructive pulmonary disease) (HCC)   . Depression   . Hep C w/o coma, chronic (HCC)   . History of hiatal hernia    hernia rt groin  . Mental health disorder   . Scoliosis   . Thyroid disease     Patient Active Problem List   Diagnosis Date Noted  . Substance induced mood disorder (HCC) 02/14/2020  . MDD (major depressive disorder), severe (HCC)  04/10/2019  . 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 Cocaine abuse with cocaine-induced mood disorder (HCC) 01/29/2018  . Polysubstance dependence (HCC) 01/29/2018  . Severe recurrent major depression without psychotic features (HCC) 08/20/2017  . Severe episode of recurrent major depressive disorder, without psychotic features (HCC) 07/08/2016  . MDD (major depressive disorder), recurrent severe, without psychosis (HCC) 05/23/2016  . Hepatitis C 05/23/2016  . Alcohol use disorder, severe, dependence (HCC) 11/05/2015  . Depression, major, recurrent, moderate (HCC) 11/05/2015  . GAD (generalized anxiety disorder) 11/05/2015  . Cellulitis of right lower extremity 03/29/2015    Past Surgical History:  Procedure Laterality Date  . SKIN CANCER EXCISION    . THYROID SURGERY         Family History  Problem Relation Age of Onset  . Dementia Mother     Social History   Tobacco Use  . Smoking status: Current Every Day Smoker    Packs/day: 1.00    Years: 40.00    Pack years: 40.00    Types: Cigarettes  . Smokeless tobacco: Never Used  . Tobacco comment: Patient refused  Substance Use Topics  . Alcohol use: Yes    Comment: "6 pack a day"  . Drug use: Yes    Types: Cocaine, Marijuana    Comment: also states will take pain pills every now and then, last use of  cocaine and ETOH today at 1400    Home Medications Prior to Admission medications   Medication Sig Start Date End Date Taking? Authorizing Provider  acamprosate (CAMPRAL) 333 MG tablet Take 2 tablets (666 mg total) by mouth 3 (three) times daily. Patient not taking: Reported on 02/14/2020 04/15/19   Johnn Hai, MD  albuterol (VENTOLIN HFA) 108 (90 Base) MCG/ACT inhaler Inhale 1-2 puffs into the lungs every 6 (six) hours as needed for wheezing or shortness of breath. 05/30/19   Varney Biles, MD  hydrOXYzine (ATARAX/VISTARIL) 25 MG tablet Take 25 mg by mouth every 6 (six)  hours as needed for anxiety.    [provider]  traZODone (DESYREL) 50 MG tablet Take 1 tablet (50 mg total) by mouth at bedtime as needed for sleep. 02/14/20   Mallie Darting, NP    Allergies    Patient has no known allergies.  Review of Systems   Review of Systems  Constitutional: Negative for diaphoresis and fever.  HENT: Negative for ear pain and sore throat.   Respiratory: Positive for shortness of breath and wheezing. Negative for cough, hemoptysis and sputum production.   Cardiovascular: Negative for chest pain, claudication, syncope and PND.  Gastrointestinal: Negative for abdominal pain and vomiting.  Musculoskeletal: Negative for neck pain.  Skin: Negative for rash.  Neurological: Negative for headaches.    Physical Exam Updated Vital Signs BP 130/82   Pulse 74   Temp 97.9 F (36.6 C)   Resp 17   Ht 6\' 3"  (1.905 m)   Wt 79.8 kg   SpO2 96%   BMI 21.99 kg/m   Physical Exam Vitals and nursing [name] reviewed.  Constitutional:      General: He is not in acute distress.    Appearance: He is well-developed. He is not diaphoretic.  HENT:     Head: Normocephalic and atraumatic.  Eyes:     General: No scleral icterus.    Conjunctiva/sclera: Conjunctivae normal.  Cardiovascular:     Rate and Rhythm: Normal rate and regular rhythm.     Heart sounds: Normal heart sounds.  Pulmonary:     Effort: Pulmonary effort is normal. No respiratory distress.     Breath sounds: Normal breath sounds.  Abdominal:     Palpations: Abdomen is soft.     Tenderness: There is no abdominal tenderness.  Musculoskeletal:     Cervical back: Normal range of motion and neck supple.  Skin:    General: Skin is warm and dry.  Neurological:     Mental Status: He is alert.  Psychiatric:        Behavior: Behavior normal.     ED Results / Procedures / Treatments   Labs (all labs ordered are listed, but only abnormal results are displayed) Labs Reviewed  BASIC METABOLIC PANEL -  Abnormal; Notable for the following components:      Result Value   CO2 20 (*)    All other components within normal limits  SARS CORONAVIRUS 2 BY RT PCR (HOSPITAL ORDER, [facility])  CBC  HEPATIC FUNCTION PANEL  AMMONIA  ETHANOL  RAPID URINE DRUG SCREEN, HOSP PERFORMED  URINALYSIS, ROUTINE W REFLEX MICROSCOPIC  TROPONIN I (HIGH SENSITIVITY)  TROPONIN I (HIGH SENSITIVITY)    EKG EKG Interpretation  Date/Time:  Monday Feb 24 2020 06:17:59 EDT Ventricular Rate:  74 PR Interval:  162 QRS Duration: 102 QT Interval:  388 QTC Calculation: 430 R Axis:   83 Text Interpretation: Normal  sinus rhythm no acute ST/T changes similar to 2019 Confirmed by Pricilla Loveless [phone]) on 02/24/2020 8:15:19 AM   Radiology No results found.  Procedures Procedures (including critical care time)  Medications Ordered in ED Medications - No data to display  ED Course  I have reviewed the triage vital signs and the nursing notes.  Pertinent labs & imaging results that were available during my care of the patient were reviewed by me and considered in my medical decision making (see chart for details).    MDM Rules/Calculators/A&P                      CC:sob VS: BP 106/76   Pulse 68   Temp 97.9 F (36.6 C)   Resp 17   Ht 6\' 3"  (1.905 m)   Wt 79.8 kg   SpO2 98%   BMI 21.99 kg/m   is gathered by patient and EMR. Previous records obtained and reviewed. DDX:The patient's complaint of sob involves an extensive number of diagnostic and treatment options, and is a complaint that carries with it a high risk of complications, morbidity, and potential mortality. Given the large differential diagnosis, medical decision making is of high complexity. The emergent differential diagnosis for shortness of breath includes, but is not limited to, Pulmonary edema, bronchoconstriction, Pneumonia, Pulmonary embolism, Pneumotherax/ Hemothorax, Dysrythmia, ACS.  Labs: I  ordered reviewed and interpreted labs which include UDS which is positive for marijuana, troponin which is negative.  Ammonia level is just above normal.  CBC shows no abnormalities, Covid test negative, hepatic function panel shows total protein levels are low, BMP with just below normal bicarb level likely secondary to COPD.  Urinalysis without abnormality, ethanol just above normal. Imaging: I ordered and reviewed images which included 1 view chest x-ray. I independently visualized and interpreted all imaging. There are no acute, significant findings on today's images .  EKG: Normal sinus rhythm at a rate of 74 Consults: N/A MDM: Patient here with complaint of shortness of breath.  Likely some bronchospasm which has significantly improved.  Patient did have lower oxygen saturations which have improved with 2 puffs of albuterol, Solu-Medrol and some fluids.  Patient is able to ambulate in the emergency department with oxygen saturations at 96% on room air and has no new oxygen deficit.  His ammonia level is just above normal and likely a reflection of his chronic hep C and alcohol abuse.  He does not have any altered mentation concerning for hepatic encephalopathy.  Patient be discharged with inhaler and Medrol Dosepak.  He is to follow closely with his primary care physician discussed return precautions. Patient disposition: Discharge The patient appears reasonably screened and/or stabilized for discharge and I doubt any other medical condition or other [facility] requiring further screening, evaluation, or treatment in the ED at this time prior to discharge. I have discussed lab and/or imaging findings with the patient and answered all questions/concerns to the best of my ability.I have discussed return precautions and OP follow up.    Final Clinical Impression(s) / ED Diagnoses Final diagnoses:  None    Rx / DC Orders ED Discharge Orders    None       [facility], PA-C 02/24/20 1303      04/25/20, MD 02/25/20 [phone]

## 2020-02-24 NOTE — Discharge Instructions (Signed)
Contact a health care provider if: °Your condition does not improve as soon as expected. °You have a hard time doing your normal activities, even after you rest. °You have new symptoms. °Get help right away if: °Your shortness of breath gets worse. °You have shortness of breath when you are resting. °You feel light-headed or you faint. °You have a cough that is not controlled with medicines. °You cough up blood. °You have pain with breathing. °You have pain in your chest, arms, shoulders, or abdomen. °You have a fever. °You cannot walk up stairs or exercise the way that you normally do. °

## 2020-06-10 ENCOUNTER — Other Ambulatory Visit: Payer: Self-pay

## 2020-06-10 ENCOUNTER — Emergency Department (HOSPITAL_COMMUNITY): Payer: Medicaid Other

## 2020-06-10 ENCOUNTER — Emergency Department (HOSPITAL_COMMUNITY)
Admission: EM | Admit: 2020-06-10 | Discharge: 2020-06-10 | Disposition: A | Discharge status: Home / Self Care | Payer: Medicaid Other | Attending: Emergency Medicine | Admitting: Emergency Medicine

## 2020-06-10 DIAGNOSIS — J80 Acute respiratory distress syndrome: Secondary | ICD-10-CM | POA: Diagnosis not present

## 2020-06-10 DIAGNOSIS — R0602 Shortness of breath: Secondary | ICD-10-CM

## 2020-06-10 DIAGNOSIS — Z79899 Other long term (current) drug therapy: Secondary | ICD-10-CM | POA: Diagnosis not present

## 2020-06-10 DIAGNOSIS — I959 Hypotension, unspecified: Secondary | ICD-10-CM | POA: Diagnosis not present

## 2020-06-10 DIAGNOSIS — F17210 Nicotine dependence, cigarettes, uncomplicated: Secondary | ICD-10-CM | POA: Diagnosis not present

## 2020-06-10 DIAGNOSIS — E0789 Other specified disorders of thyroid: Secondary | ICD-10-CM | POA: Insufficient documentation

## 2020-06-10 DIAGNOSIS — J9811 Atelectasis: Secondary | ICD-10-CM | POA: Diagnosis not present

## 2020-06-10 DIAGNOSIS — J441 Chronic obstructive pulmonary disease with (acute) exacerbation: Secondary | ICD-10-CM | POA: Diagnosis not present

## 2020-06-10 DIAGNOSIS — J449 Chronic obstructive pulmonary disease, unspecified: Secondary | ICD-10-CM | POA: Diagnosis not present

## 2020-06-10 MED ORDER — ALBUTEROL SULFATE HFA 108 (90 BASE) MCG/ACT IN AERS
4.0000 | INHALATION_SPRAY | Freq: Once | RESPIRATORY_TRACT | Status: AC
  Administered 2020-06-10: 4 via RESPIRATORY_TRACT
  Filled 2020-06-10: qty 6.7

## 2020-06-10 MED ORDER — AEROCHAMBER PLUS FLO-VU LARGE MISC
1.0000 | Freq: Once | Status: DC
  Filled 2020-06-10: qty 1

## 2020-06-10 MED ORDER — AEROCHAMBER Z-STAT PLUS/MEDIUM MISC
Status: AC
  Administered 2020-06-10: 1
  Filled 2020-06-10: qty 1

## 2020-06-10 MED ORDER — PREDNISONE 10 MG PO TABS: 40.0000 mg | ORAL_TABLET | Freq: Every day | ORAL | 0 refills | Status: AC

## 2020-06-10 NOTE — ED Provider Notes (Signed)
[facility]  CSN: [id number] Arrival date & time: 06/10/20 3664  Chief Complaint(s) Shortness of Breath  HPI Tim [name] is a 64 y.o. male with a history of COPD not requiring supplemental oxygen who presents to the emergency department with gradual onset shortness of breath that began approximately 5 hours prior to arrival.  Similar to prior COPD exacerbations.  Patient endorses continued tobacco use.  Patient attempted to relieve his shortness of breath with home inhalers without success.  Called EMS due to worsening symptoms.  EMS provided patient with 2 duo nebs and Solu-Medrol which completely resolved the patient's shortness of breath.  Currently he reports feeling back to his baseline.  Denies any recent fevers or infections.  No cough or congestion.  No associated chest pain.  No nausea or vomiting.  No abdominal pain.  HPI  Past Medical History Past Medical History:  Diagnosis Date  . Alcoholism (HCC)   . Anxiety   . COPD (chronic obstructive pulmonary disease) (HCC)   . Depression   . Hep C w/o coma, chronic (HCC)   . History of hiatal hernia    hernia rt groin  . Mental health disorder   . Scoliosis   . Thyroid disease    Patient Active Problem List   Diagnosis Date Noted  . Substance induced mood disorder (HCC) 02/14/2020  . MDD (major depressive disorder), severe (HCC) 04/10/2019  . 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 Cocaine abuse with cocaine-induced mood disorder (HCC) 01/29/2018  . Polysubstance dependence (HCC) 01/29/2018  . Severe recurrent major depression without psychotic features (HCC) 08/20/2017  . Severe episode of recurrent major depressive disorder, without psychotic features (HCC) 07/08/2016  . MDD (major depressive disorder), recurrent severe, without psychosis (HCC) 05/23/2016  . Hepatitis C 05/23/2016  . Alcohol use disorder, severe, dependence  (HCC) 11/05/2015  . Depression, major, recurrent, moderate (HCC) 11/05/2015  . GAD (generalized anxiety disorder) 11/05/2015  . Cellulitis of right lower extremity 03/29/2015   Home Medication(s) Prior to Admission medications   Medication Sig Start Date End Date Taking? Authorizing Provider  acamprosate (CAMPRAL) 333 MG tablet Take 2 tablets (666 mg total) by mouth 3 (three) times daily. Patient not taking: Reported on 02/14/2020 04/15/19   Malvin Johns, MD  albuterol (VENTOLIN HFA) 108 (90 Base) MCG/ACT inhaler Inhale 1-2 puffs into the lungs every 6 (six) hours as needed for wheezing or shortness of breath. 02/24/20   Arthor Captain, PA-C  hydrOXYzine (ATARAX/VISTARIL) 25 MG tablet Take 25 mg by mouth every 6 (six) hours as needed for anxiety.    [provider]  ibuprofen (ADVIL) 200 MG tablet Take 200 mg by mouth every 6 (six) hours as needed for headache, mild pain or moderate pain.    [provider]  methylPREDNISolone (MEDROL DOSEPAK) 4 MG TBPK tablet Use as directed 02/24/20   Arthor Captain, PA-C  Multiple Vitamin (MULTIVITAMIN ADULT PO) Take 1 tablet by mouth daily.    [provider]  predniSONE (DELTASONE) 10 MG tablet Take 4 tablets (40 mg total) by mouth daily for 4 days. 06/10/20 06/14/20  Nira Conn, MD  traZODone (DESYREL) 50 MG tablet Take 1 tablet (50 mg total) by mouth at bedtime as needed for sleep. 02/14/20   Chales Abrahams, NP  Past Surgical History Past Surgical History:  Procedure Laterality Date  . SKIN CANCER EXCISION    . THYROID SURGERY     Family History Family History  Problem Relation Age of Onset  . Dementia Mother     Social History Social History   Tobacco Use  . Smoking status: Current Every Day Smoker    Packs/day: 1.00    Years: 40.00    Pack years: 40.00    Types: Cigarettes    . Smokeless tobacco: Never Used  . Tobacco comment: Patient refused  Vaping Use  . Vaping Use: Never used  Substance Use Topics  . Alcohol use: Yes    Comment: "6 pack a day"  . Drug use: Yes    Types: Cocaine, Marijuana    Comment: also states will take pain pills every now and then, last use of cocaine and ETOH today at 1400   Allergies Patient has no known allergies.  Review of Systems Review of Systems All other systems are reviewed and are negative for acute change except as noted in the HPI  Physical Exam Vital Signs  I have reviewed the triage vital signs BP 119/75   Pulse 92   Temp (!) 97.5 F (36.4 C)   Resp (!) 28   Ht 6\' 3"  (1.905 m)   Wt 81.6 kg   SpO2 95%   BMI 22.50 kg/m   Physical Exam Vitals reviewed.  Constitutional:      General: He is not in acute distress.    Appearance: He is well-developed. He is not diaphoretic.  HENT:     Head: Normocephalic and atraumatic.     Nose: Nose normal.  Eyes:     General: No scleral icterus.       Right eye: No discharge.        Left eye: No discharge.     Conjunctiva/sclera: Conjunctivae normal.     Pupils: Pupils are equal, round, and reactive to light.  Cardiovascular:     Rate and Rhythm: Normal rate and regular rhythm.     Heart sounds: No murmur heard.  No friction rub. No gallop.   Pulmonary:     Effort: Pulmonary effort is normal. No respiratory distress.     Breath sounds: Normal breath sounds. No stridor. No rales.  Abdominal:     General: There is no distension.     Palpations: Abdomen is soft.     Tenderness: There is no abdominal tenderness.  Musculoskeletal:        General: No tenderness.     Cervical back: Normal range of motion and neck supple.  Skin:    General: Skin is warm and dry.     Findings: No erythema or rash.  Neurological:     Mental Status: He is alert and oriented to person, place, and time.     ED Results and Treatments Labs (all labs ordered are listed, but only  abnormal results are displayed) Labs Reviewed - No data to display  EKG  EKG Interpretation  Date/Time:  Wednesday June 10 2020 03:32:34 EDT Ventricular Rate:  83 PR Interval:    QRS Duration: 115 QT Interval:  382 QTC Calculation: 449 R Axis:   64 Text Interpretation: Sinus rhythm Nonspecific intraventricular conduction delay No acute changes Confirmed by Drema Pry [phone]) on 06/10/2020 4:01:13 AM      Radiology DG Chest 2 View  Result Date: 06/10/2020 CLINICAL DATA:  Increasing shortness of breath.  COPD EXAM: CHEST - 2 VIEW COMPARISON:  02/24/2020 FINDINGS: Hyperinflation and architectural distortion. There is no edema, air bronchogram, effusion, or pneumothorax. There is a triangular band of opacity overlapping the heart on the lateral view. Normal heart size and mediastinal contours. Artifact from EKG leads. IMPRESSION: Triangular opacity over the heart on the lateral view, not seen on priors and likely reflecting atelectasis. COPD Electronically Signed   By: Marnee Spring M.D.   On: 06/10/2020 04:26    Pertinent labs & imaging results that were available during my care of the patient were reviewed by me and considered in my medical decision making (see chart for details).  Medications Ordered in ED Medications  albuterol (VENTOLIN HFA) 108 (90 Base) MCG/ACT inhaler 4 puff (4 puffs Inhalation Given 06/10/20 0425)                                                                                                                                    Procedures Procedures  (including critical care time)  Medical Decision Making / ED Course I have reviewed the nursing notes for this encounter and the patient's prior records (if available in EHR or on provided paperwork).   DARIS HARKINS was evaluated in Emergency Department on 06/10/2020 for the symptoms  described in the history of present illness. He was evaluated in the context of the global COVID-19 pandemic, which necessitated consideration that the patient might be at risk for infection with the SARS-CoV-2 virus that causes COVID-19. Institutional protocols and algorithms that pertain to the evaluation of patients at risk for COVID-19 are in a state of rapid change based on information released by regulatory bodies including the CDC and federal and state organizations. These policies and algorithms were followed during the patient's care in the ED.  SOB consistent with COPD exacerbation. Improved with DUONEBS and solumedrol by EMS. No asymptomatic and sating well on RA.  CXR clear.  Will monitor.  6:29 AM Remained stable w/o return of his SOB.  Given aerochamber for home use. Rx for prednisone.      Final Clinical Impression(s) / ED Diagnoses Final diagnoses:  SOB (shortness of breath)  COPD exacerbation (HCC)   The patient appears reasonably screened and/or stabilized for discharge and I doubt any other medical condition or other [facility] requiring further screening, evaluation, or treatment in the ED at this time prior to discharge. Safe for discharge with strict return precautions.  Disposition: Discharge  Condition: Good  I have discussed the results, Dx and Tx plan with the patient/family who expressed understanding and agree(s) with the plan. Discharge instructions discussed at length. The patient/family was given strict return precautions who verbalized understanding of the instructions. No further questions at time of discharge.    ED Discharge Orders         Ordered    predniSONE (DELTASONE) 10 MG tablet  Daily        06/10/20 [id number]           Follow Up: Avon GullyFanta, Tesfaye, MD [address] [place] [id number] [phone]  Schedule an appointment as soon as possible for a visit  As needed      This chart was dictated using voice recognition  software.  Despite best efforts to proofread,  errors can occur which can change the documentation meaning.   Nira Connardama, Thi Klich Eduardo, MD 06/10/20 0630

## 2020-06-10 NOTE — ED Triage Notes (Signed)
Presents from home via [facility], c/o increasing SOB since approx 10pm, hx COPD, denies fevers, CP or sick contacts, given 2x duonebs and solumedrol en route, 94% RA

## 2020-06-22 ENCOUNTER — Emergency Department (HOSPITAL_COMMUNITY)
Admission: EM | Admit: 2020-06-22 | Discharge: 2020-06-23 | Disposition: A | Discharge status: Home / Self Care | Payer: Medicaid Other | Attending: Emergency Medicine | Admitting: Emergency Medicine

## 2020-06-22 ENCOUNTER — Other Ambulatory Visit: Payer: Self-pay

## 2020-06-22 ENCOUNTER — Encounter (HOSPITAL_COMMUNITY): Payer: Self-pay

## 2020-06-22 DIAGNOSIS — R110 Nausea: Secondary | ICD-10-CM | POA: Diagnosis not present

## 2020-06-22 DIAGNOSIS — K2920 Alcoholic gastritis without bleeding: Secondary | ICD-10-CM | POA: Diagnosis not present

## 2020-06-22 DIAGNOSIS — T407X1A Poisoning by cannabis (derivatives), accidental (unintentional), initial encounter: Secondary | ICD-10-CM | POA: Diagnosis not present

## 2020-06-22 DIAGNOSIS — R0602 Shortness of breath: Secondary | ICD-10-CM | POA: Diagnosis not present

## 2020-06-22 DIAGNOSIS — I4510 Unspecified right bundle-branch block: Secondary | ICD-10-CM | POA: Diagnosis not present

## 2020-06-22 DIAGNOSIS — F17210 Nicotine dependence, cigarettes, uncomplicated: Secondary | ICD-10-CM | POA: Diagnosis not present

## 2020-06-22 DIAGNOSIS — R112 Nausea with vomiting, unspecified: Secondary | ICD-10-CM | POA: Diagnosis not present

## 2020-06-22 DIAGNOSIS — R1084 Generalized abdominal pain: Secondary | ICD-10-CM | POA: Insufficient documentation

## 2020-06-22 DIAGNOSIS — F12188 Cannabis abuse with other cannabis-induced disorder: Secondary | ICD-10-CM

## 2020-06-22 DIAGNOSIS — R9431 Abnormal electrocardiogram [ECG] [EKG]: Secondary | ICD-10-CM | POA: Diagnosis not present

## 2020-06-22 DIAGNOSIS — R1010 Upper abdominal pain, unspecified: Secondary | ICD-10-CM | POA: Diagnosis not present

## 2020-06-22 DIAGNOSIS — J449 Chronic obstructive pulmonary disease, unspecified: Secondary | ICD-10-CM | POA: Insufficient documentation

## 2020-06-22 LAB — COMPREHENSIVE METABOLIC PANEL
ALT: 18 U/L (ref 0–44)
AST: 25 U/L (ref 15–41)
Albumin: 4.4 g/dL (ref 3.5–5.0)
Alkaline Phosphatase: 52 U/L (ref 38–126)
Anion gap: 15 (ref 5–15)
BUN: 11 mg/dL (ref 8–23)
CO2: 19 mmol/L — ABNORMAL LOW (ref 22–32)
Calcium: 10.1 mg/dL (ref 8.9–10.3)
Chloride: 103 mmol/L (ref 98–111)
Creatinine, Ser: 1.03 mg/dL (ref 0.61–1.24)
GFR calc Af Amer: >60 mL/min (ref >60)
GFR calc non Af Amer: >60 mL/min (ref >60)
Glucose, Bld: 108 mg/dL — ABNORMAL HIGH (ref 70–99)
Potassium: 3.9 mmol/L (ref 3.5–5.1)
Sodium: 137 mmol/L (ref 135–145)
Total Bilirubin: 1.5 mg/dL — ABNORMAL HIGH (ref 0.3–1.2)
Total Protein: 7.1 g/dL (ref 6.5–8.1)

## 2020-06-22 LAB — CBC WITH DIFFERENTIAL/PLATELET
Abs Immature Granulocytes: 0.03 10*3/uL (ref 0.00–0.07)
Basophils Absolute: 0.1 10*3/uL (ref 0.0–0.1)
Basophils Relative: 1 %
Eosinophils Absolute: 0 10*3/uL (ref 0.0–0.5)
Eosinophils Relative: 0 %
HCT: 48.6 % (ref 39.0–52.0)
Hemoglobin: 17.1 g/dL — ABNORMAL HIGH (ref 13.0–17.0)
Immature Granulocytes: 0 %
Lymphocytes Relative: 12 %
Lymphs Abs: 1 10*3/uL (ref 0.7–4.0)
MCH: 32 pg (ref 26.0–34.0)
MCHC: 35.2 g/dL (ref 30.0–36.0)
MCV: 91 fL (ref 80.0–100.0)
Monocytes Absolute: 0.3 10*3/uL (ref 0.1–1.0)
Monocytes Relative: 4 %
Neutro Abs: 7.4 10*3/uL (ref 1.7–7.7)
Neutrophils Relative %: 83 %
Platelets: 227 10*3/uL (ref 150–400)
RBC: 5.34 MIL/uL (ref 4.22–5.81)
RDW: 13.5 % (ref 11.5–15.5)
WBC: 8.8 10*3/uL (ref 4.0–10.5)
nRBC: 0 % (ref 0.0–0.2)

## 2020-06-22 LAB — ETHANOL: Alcohol, Ethyl (B): <10 mg/dL (ref <10)

## 2020-06-22 LAB — LIPASE, BLOOD: Lipase: 26 U/L (ref 11–51)

## 2020-06-22 MED ORDER — HALOPERIDOL LACTATE 5 MG/ML IJ SOLN
2.0000 mg | Freq: Once | INTRAMUSCULAR | Status: AC
  Administered 2020-06-22: 2 mg via INTRAVENOUS
  Filled 2020-06-22: qty 1

## 2020-06-22 NOTE — ED Triage Notes (Signed)
Pt BIB EMS from home. Pt reports N/V and abdominal pain x4 hours.   18G LFA 8mg  Zofran 130/82 HR 78 SpO2 100% RA CBG 104

## 2020-06-23 ENCOUNTER — Encounter (HOSPITAL_COMMUNITY): Payer: Self-pay | Admitting: Emergency Medicine

## 2020-06-23 LAB — URINALYSIS, ROUTINE W REFLEX MICROSCOPIC
Bilirubin Urine: NEGATIVE
Glucose, UA: NEGATIVE mg/dL
Hgb urine dipstick: NEGATIVE
Ketones, ur: 80 mg/dL — AB
Leukocytes,Ua: NEGATIVE
Nitrite: NEGATIVE
Protein, ur: NEGATIVE mg/dL
Specific Gravity, Urine: 1.02 (ref 1.005–1.030)
pH: 5 (ref 5.0–8.0)

## 2020-06-23 MED ORDER — SODIUM CHLORIDE 0.9 % IV BOLUS
500.0000 mL | Freq: Once | INTRAVENOUS | Status: AC
  Administered 2020-06-23: 500 mL via INTRAVENOUS

## 2020-06-23 NOTE — ED Provider Notes (Signed)
[facility]-EMERGENCY DEPT Provider Note   CSN: [id number] Arrival date & time: 06/22/20  2051     History Chief Complaint  Patient presents with  . Abdominal Pain  . Emesis    Tim [name] is a 64 y.o. male.  The history is provided by the patient.  Abdominal Pain Pain location:  Generalized Pain quality: cramping   Pain radiates to:  Does not radiate Pain severity:  Moderate Duration:  4 hours Timing:  Constant Progression:  Unchanged Chronicity:  Recurrent Context: alcohol use   Context comment:  Marijuana use Relieved by:  Nothing Worsened by:  Nothing Ineffective treatments:  None tried Associated symptoms: nausea and vomiting   Associated symptoms: no anorexia, no belching, no chest pain, no chills, no constipation, no cough, no diarrhea, no dysuria, no fatigue, no fever, no flatus, no hematemesis, no hematochezia, no hematuria, no melena, no shortness of breath, no sore throat, no vaginal bleeding and no vaginal discharge   Risk factors: no alcohol abuse   Emesis Severity:  Moderate Timing:  Sporadic Quality:  Stomach contents Progression:  Unchanged Chronicity:  Recurrent Recent urination:  Normal Context: not post-tussive   Relieved by:  Nothing Worsened by:  Nothing Ineffective treatments:  None tried Associated symptoms: abdominal pain   Associated symptoms: no chills, no cough, no diarrhea, no fever, no headaches, no myalgias and no sore throat   Risk factors: alcohol use   Patient with a h/o polysubstance abuse presents after he Drank 80 oz of alcohol and smoked marijuana now with vomiting and abdominal cramps.  This has happened before.  No f/c/r.  No diarrhea.  No urinary complaints.       Past Medical History:  Diagnosis Date  . Alcoholism (HCC)   . Anxiety   . COPD (chronic obstructive pulmonary disease) (HCC)   . Depression   . Hep C w/o coma, chronic (HCC)   . History of hiatal hernia    hernia rt groin  . Mental  health disorder   . Scoliosis   . Thyroid disease     Patient Active Problem List   Diagnosis Date Noted  . Substance induced mood disorder (HCC) 02/14/2020  . MDD (major depressive disorder), severe (HCC) 04/10/2019  . 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 Cocaine abuse with cocaine-induced mood disorder (HCC) 01/29/2018  . Polysubstance dependence (HCC) 01/29/2018  . Severe recurrent major depression without psychotic features (HCC) 08/20/2017  . Severe episode of recurrent major depressive disorder, without psychotic features (HCC) 07/08/2016  . MDD (major depressive disorder), recurrent severe, without psychosis (HCC) 05/23/2016  . Hepatitis C 05/23/2016  . Alcohol use disorder, severe, dependence (HCC) 11/05/2015  . Depression, major, recurrent, moderate (HCC) 11/05/2015  . GAD (generalized anxiety disorder) 11/05/2015  . Cellulitis of right lower extremity 03/29/2015    Past Surgical History:  Procedure Laterality Date  . SKIN CANCER EXCISION    . THYROID SURGERY         Family History  Problem Relation Age of Onset  . Dementia Mother     Social History   Tobacco Use  . Smoking status: Current Every Day Smoker    Packs/day: 1.00    Years: 40.00    Pack years: 40.00    Types: Cigarettes  . Smokeless tobacco: Never Used  . Tobacco comment: Patient refused  Vaping Use  . Vaping Use: Never used  Substance Use Topics  . Alcohol use: Yes  Comment: "6 pack a day"  . Drug use: Yes    Types: Cocaine, Marijuana    Comment: also states will take pain pills every now and then, last use of cocaine and ETOH today at 1400    Home Medications Prior to Admission medications   Medication Sig Start Date End Date Taking? Authorizing Provider  acamprosate (CAMPRAL) 333 MG tablet Take 2 tablets (666 mg total) by mouth 3 (three) times daily. Patient not taking: Reported on 02/14/2020 04/15/19   Malvin JohnsFarah, Brian, MD  albuterol  (VENTOLIN HFA) 108 (90 Base) MCG/ACT inhaler Inhale 1-2 puffs into the lungs every 6 (six) hours as needed for wheezing or shortness of breath. 02/24/20   Arthor CaptainHarris, Abigail, PA-C  hydrOXYzine (ATARAX/VISTARIL) 25 MG tablet Take 25 mg by mouth every 6 (six) hours as needed for anxiety.    [provider]  ibuprofen (ADVIL) 200 MG tablet Take 200 mg by mouth every 6 (six) hours as needed for headache, mild pain or moderate pain.    [provider]  methylPREDNISolone (MEDROL DOSEPAK) 4 MG TBPK tablet Use as directed 02/24/20   Arthor CaptainHarris, Abigail, PA-C  Multiple Vitamin (MULTIVITAMIN ADULT PO) Take 1 tablet by mouth daily.    [provider]  traZODone (DESYREL) 50 MG tablet Take 1 tablet (50 mg total) by mouth at bedtime as needed for sleep. 02/14/20   Chales AbrahamsMills, Shnese E, NP    Allergies    Patient has no known allergies.  Review of Systems   Review of Systems  Constitutional: Negative for chills, fatigue and fever.  HENT: Negative for sore throat.   Eyes: Negative for visual disturbance.  Respiratory: Negative for cough and shortness of breath.   Cardiovascular: Negative for chest pain.  Gastrointestinal: Positive for abdominal pain, nausea and vomiting. Negative for anorexia, constipation, diarrhea, flatus, hematemesis, hematochezia and melena.  Genitourinary: Negative for dysuria, hematuria, vaginal bleeding and vaginal discharge.  Musculoskeletal: Negative for myalgias.  Skin: Negative for rash.  Neurological: Negative for headaches.  Psychiatric/Behavioral: Negative for agitation.  All other systems reviewed and are negative.   Physical Exam Updated Vital Signs BP 95/68   Pulse 75   Temp 98.7 F (37.1 C) (Oral)   Resp (!) 22   SpO2 91%   Physical Exam  ED Results / Procedures / Treatments   Labs (all labs ordered are listed, but only abnormal results are displayed) Results for orders placed or performed during the hospital encounter of 06/22/20  CBC with  Differential  Result Value Ref Range   WBC 8.8 4.0 - 10.5 K/uL   RBC 5.34 4.22 - 5.81 MIL/uL   Hemoglobin 17.1 (H) 13.0 - 17.0 g/dL   HCT 16.148.6 39 - 52 %   MCV 91.0 80.0 - 100.0 fL   MCH 32.0 26.0 - 34.0 pg   MCHC 35.2 30.0 - 36.0 g/dL   RDW 09.613.5 04.511.5 - 40.915.5 %   Platelets 227 150 - 400 K/uL   nRBC 0.0 0.0 - 0.2 %   Neutrophils Relative % 83 %   Neutro Abs 7.4 1.7 - 7.7 K/uL   Lymphocytes Relative 12 %   Lymphs Abs 1.0 0.7 - 4.0 K/uL   Monocytes Relative 4 %   Monocytes Absolute 0.3 0 - 1 K/uL   Eosinophils Relative 0 %   Eosinophils Absolute 0.0 0 - 0 K/uL   Basophils Relative 1 %   Basophils Absolute 0.1 0 - 0 K/uL   Immature Granulocytes 0 %   Abs Immature  Granulocytes 0.03 0.00 - 0.07 K/uL  Ethanol  Result Value Ref Range   Alcohol, Ethyl (B) <10 <10 mg/dL  Lipase, blood  Result Value Ref Range   Lipase 26 11 - 51 U/L  Comprehensive metabolic panel  Result Value Ref Range   Sodium 137 135 - 145 mmol/L   Potassium 3.9 3.5 - 5.1 mmol/L   Chloride 103 98 - 111 mmol/L   CO2 19 (L) 22 - 32 mmol/L   Glucose, Bld 108 (H) 70 - 99 mg/dL   BUN 11 8 - 23 mg/dL   Creatinine, Ser 7.02 0.61 - 1.24 mg/dL   Calcium 63.7 8.9 - 85.8 mg/dL   Total Protein 7.1 6.5 - 8.1 g/dL   Albumin 4.4 3.5 - 5.0 g/dL   AST 25 15 - 41 U/L   ALT 18 0 - 44 U/L   Alkaline Phosphatase 52 38 - 126 U/L   Total Bilirubin 1.5 (H) 0.3 - 1.2 mg/dL   GFR calc non Af Amer >60 >60 mL/min   GFR calc Af Amer >60 >60 mL/min   Anion gap 15 5 - 15  Urinalysis, Routine w reflex microscopic Urine, Clean Catch  Result Value Ref Range   Color, Urine YELLOW YELLOW   APPearance CLEAR CLEAR   Specific Gravity, Urine 1.020 1.005 - 1.030   pH 5.0 5.0 - 8.0   Glucose, UA NEGATIVE NEGATIVE mg/dL   Hgb urine dipstick NEGATIVE NEGATIVE   Bilirubin Urine NEGATIVE NEGATIVE   Ketones, ur 80 (A) NEGATIVE mg/dL   Protein, ur NEGATIVE NEGATIVE mg/dL   Nitrite NEGATIVE NEGATIVE   Leukocytes,Ua NEGATIVE NEGATIVE   DG Chest 2  View  Result Date: 06/10/2020 CLINICAL DATA:  Increasing shortness of breath.  COPD EXAM: CHEST - 2 VIEW COMPARISON:  02/24/2020 FINDINGS: Hyperinflation and architectural distortion. There is no edema, air bronchogram, effusion, or pneumothorax. There is a triangular band of opacity overlapping the heart on the lateral view. Normal heart size and mediastinal contours. Artifact from EKG leads. IMPRESSION: Triangular opacity over the heart on the lateral view, not seen on priors and likely reflecting atelectasis. COPD Electronically Signed   By: Marnee Spring M.D.   On: 06/10/2020 04:26    Radiology No results found.  Procedures Procedures (including critical care time)  Medications Ordered in ED Medications  haloperidol lactate (HALDOL) injection 2 mg (2 mg Intravenous Given 06/22/20 2340)    ED Course  I have reviewed the triage vital signs and the nursing notes.  Pertinent labs & imaging results that were available during my care of the patient were reviewed by me and considered in my medical decision making (see chart for details).    No further emesis in the ED.  Exam and vitals are benign and reassuring this is a combination of the alcohol and marijuana. PO challenged successfully in the ED.  Stable for discharge with close follow up.    Tim [name] was evaluated in Emergency Department on 06/23/2020 for the symptoms described in the history of present illness. He was evaluated in the context of the global COVID-19 pandemic, which necessitated consideration that the patient might be at risk for infection with the SARS-CoV-2 virus that causes COVID-19. Institutional protocols and algorithms that pertain to the evaluation of patients at risk for COVID-19 are in a state of rapid change based on information released by regulatory bodies including the CDC and federal and state organizations. These policies and algorithms were followed during the patient's care in the  ED.  Final Clinical  Impression(s) / ED Diagnoses Return for intractable cough, coughing up blood,fevers >100.4 unrelieved by medication, shortness of breath, intractable vomiting, chest pain, shortness of breath, weakness,numbness, changes in speech, facial asymmetry,abdominal pain, passing out,Inability to tolerate liquids or food, cough, altered mental status or any concerns. No signs of systemic illness or infection. The patient is nontoxic-appearing on exam and vital signs are within normal limits.   I have reviewed the triage vital signs and the nursing notes. Pertinent labs &imaging results that were available during my care of the patient were reviewed by me and considered in my medical decision making (see chart for details).After history, exam, and medical workup I feel the patient has beenappropriately medically screened and is safe for discharge home. Pertinent diagnoses were discussed with the patient. Patient was given return precautions.      Elray Dains, MD 06/23/20 [phone]

## 2020-06-23 NOTE — ED Notes (Signed)
Pt given water and graham crackers for PO challenge

## 2020-06-23 NOTE — ED Notes (Signed)
Pt given cup of gingerale °

## 2020-09-01 ENCOUNTER — Ambulatory Visit (HOSPITAL_COMMUNITY)
Admission: AD | Admit: 2020-09-01 | Discharge: 2020-09-01 | Disposition: A | Discharge status: Home / Self Care | Payer: Medicaid Other | Attending: Psychiatry | Admitting: Psychiatry

## 2020-09-01 ENCOUNTER — Emergency Department (HOSPITAL_COMMUNITY): Payer: Medicaid Other

## 2020-09-01 ENCOUNTER — Encounter (HOSPITAL_COMMUNITY): Payer: Self-pay

## 2020-09-01 ENCOUNTER — Emergency Department (HOSPITAL_COMMUNITY)
Admission: EM | Admit: 2020-09-01 | Discharge: 2020-09-01 | Disposition: A | Discharge status: Home / Self Care | Payer: Medicaid Other | Attending: Emergency Medicine | Admitting: Emergency Medicine

## 2020-09-01 ENCOUNTER — Other Ambulatory Visit: Payer: Self-pay

## 2020-09-01 DIAGNOSIS — F1010 Alcohol abuse, uncomplicated: Secondary | ICD-10-CM | POA: Diagnosis not present

## 2020-09-01 DIAGNOSIS — R1031 Right lower quadrant pain: Secondary | ICD-10-CM | POA: Insufficient documentation

## 2020-09-01 DIAGNOSIS — F1020 Alcohol dependence, uncomplicated: Secondary | ICD-10-CM | POA: Diagnosis not present

## 2020-09-01 DIAGNOSIS — K529 Noninfective gastroenteritis and colitis, unspecified: Secondary | ICD-10-CM | POA: Diagnosis not present

## 2020-09-01 DIAGNOSIS — R000 Tachycardia, unspecified: Secondary | ICD-10-CM | POA: Diagnosis not present

## 2020-09-01 DIAGNOSIS — J449 Chronic obstructive pulmonary disease, unspecified: Secondary | ICD-10-CM | POA: Diagnosis not present

## 2020-09-01 DIAGNOSIS — F17210 Nicotine dependence, cigarettes, uncomplicated: Secondary | ICD-10-CM | POA: Diagnosis not present

## 2020-09-01 LAB — CBC WITH DIFFERENTIAL/PLATELET
Abs Immature Granulocytes: 0.03 10*3/uL (ref 0.00–0.07)
Basophils Absolute: 0.1 10*3/uL (ref 0.0–0.1)
Basophils Relative: 1 %
Eosinophils Absolute: 0.1 10*3/uL (ref 0.0–0.5)
Eosinophils Relative: 2 %
HCT: 51.6 % (ref 39.0–52.0)
Hemoglobin: 17.7 g/dL — ABNORMAL HIGH (ref 13.0–17.0)
Immature Granulocytes: 1 %
Lymphocytes Relative: 29 %
Lymphs Abs: 1.9 10*3/uL (ref 0.7–4.0)
MCH: 32.4 pg (ref 26.0–34.0)
MCHC: 34.3 g/dL (ref 30.0–36.0)
MCV: 94.3 fL (ref 80.0–100.0)
Monocytes Absolute: 0.4 10*3/uL (ref 0.1–1.0)
Monocytes Relative: 6 %
Neutro Abs: 4.1 10*3/uL (ref 1.7–7.7)
Neutrophils Relative %: 61 %
Platelets: 228 10*3/uL (ref 150–400)
RBC: 5.47 MIL/uL (ref 4.22–5.81)
RDW: 13.4 % (ref 11.5–15.5)
WBC: 6.5 10*3/uL (ref 4.0–10.5)
nRBC: 0 % (ref 0.0–0.2)

## 2020-09-01 LAB — COMPREHENSIVE METABOLIC PANEL
ALT: 16 U/L (ref 0–44)
AST: 24 U/L (ref 15–41)
Albumin: 4.4 g/dL (ref 3.5–5.0)
Alkaline Phosphatase: 57 U/L (ref 38–126)
Anion gap: 12 (ref 5–15)
BUN: 10 mg/dL (ref 8–23)
CO2: 19 mmol/L — ABNORMAL LOW (ref 22–32)
Calcium: 10.3 mg/dL (ref 8.9–10.3)
Chloride: 105 mmol/L (ref 98–111)
Creatinine, Ser: 0.93 mg/dL (ref 0.61–1.24)
GFR, Estimated: >60 mL/min (ref >60)
Glucose, Bld: 80 mg/dL (ref 70–99)
Potassium: 3.6 mmol/L (ref 3.5–5.1)
Sodium: 136 mmol/L (ref 135–145)
Total Bilirubin: 0.7 mg/dL (ref 0.3–1.2)
Total Protein: 7.1 g/dL (ref 6.5–8.1)

## 2020-09-01 LAB — URINALYSIS, ROUTINE W REFLEX MICROSCOPIC
Bilirubin Urine: NEGATIVE
Glucose, UA: NEGATIVE mg/dL
Hgb urine dipstick: NEGATIVE
Ketones, ur: NEGATIVE mg/dL
Leukocytes,Ua: NEGATIVE
Nitrite: NEGATIVE
Protein, ur: NEGATIVE mg/dL
Specific Gravity, Urine: 1.003 — ABNORMAL LOW (ref 1.005–1.030)
pH: 5 (ref 5.0–8.0)

## 2020-09-01 LAB — LIPASE, BLOOD: Lipase: 34 U/L (ref 11–51)

## 2020-09-01 LAB — ETHANOL: Alcohol, Ethyl (B): 217 mg/dL — ABNORMAL HIGH (ref <10)

## 2020-09-01 MED ORDER — ONDANSETRON HCL 4 MG/2ML IJ SOLN
4.0000 mg | Freq: Once | INTRAMUSCULAR | Status: AC
  Administered 2020-09-01: 4 mg via INTRAVENOUS
  Filled 2020-09-01: qty 2

## 2020-09-01 MED ORDER — SODIUM CHLORIDE 0.9 % IV SOLN: INTRAVENOUS | Status: DC

## 2020-09-01 MED ORDER — FENTANYL CITRATE (PF) 100 MCG/2ML IJ SOLN
50.0000 ug | Freq: Once | INTRAMUSCULAR | Status: AC
  Administered 2020-09-01: 50 ug via INTRAVENOUS
  Filled 2020-09-01: qty 2

## 2020-09-01 MED ORDER — IOHEXOL 300 MG/ML  SOLN
100.0000 mL | Freq: Once | INTRAMUSCULAR | Status: AC | PRN
  Administered 2020-09-01: 100 mL via INTRAVENOUS

## 2020-09-01 NOTE — ED Provider Notes (Signed)
[facility]-EMERGENCY DEPT Provider Note   CSN: [id number] Arrival date & time: 09/01/20  1553     History Chief Complaint  Patient presents with   hernia pain   Alcohol Problem    Tim [name] is a 64 y.o. male.  HPI Presents with concern of right lower quadrant abdominal pain, swelling. Patient acknowledges alcohol abuse, drug abuse, no abdominal surgery.  About 1 week ago, after lifting something he felt a protrusion in his lower abdomen. Since that time pain has been increasing, in particular over the past 2 days.  Pain is severe, not improved with copious amounts of alcohol use.  There is associated nausea.  It is unclear if he has had vomiting or diarrhea or any change in bowel movements. He seems to deny other abdominal pain, scrotal pain, dysuria, diminished urine production. Pain is focally in the right lower quadrant, with associated swelling, tenderness palpation that area.  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/2018   Cocaine abuse with cocaine-induced mood disorder (HCC) 01/29/2018   Polysubstance dependence (HCC) 01/29/2018   Severe recurrent major depression without psychotic features (HCC) 08/20/2017   Severe episode of recurrent major depressive disorder, without psychotic features (HCC) 07/08/2016   MDD (major depressive disorder), recurrent severe, without psychosis (HCC) 05/23/2016   Hepatitis C 05/23/2016   Alcohol use disorder,  severe, dependence (HCC) 11/05/2015   Depression, major, recurrent, moderate (HCC) 11/05/2015   GAD (generalized anxiety disorder) 11/05/2015   Cellulitis of right lower extremity 03/29/2015    Past Surgical History:  Procedure Laterality Date   SKIN CANCER EXCISION     THYROID SURGERY         Family History  Problem Relation Age of Onset   Dementia Mother     Social History   Tobacco Use   Smoking status: Current Every Day Smoker    Packs/day: 1.00    Years: 40.00    Pack years: 40.00    Types: Cigarettes   Smokeless tobacco: Never Used   Tobacco comment: Patient refused  Vaping Use   Vaping Use: Never used  Substance Use Topics   Alcohol use: Yes    Comment: "6 pack a day"   Drug use: Yes    Types: Cocaine, Marijuana    Home Medications Prior to Admission medications   Medication Sig Start Date End Date Taking? Authorizing Provider  acamprosate (CAMPRAL) 333 MG tablet Take 2 tablets (666 mg total) by mouth 3 (three) times daily. Patient not taking: Reported on 02/14/2020 04/15/19   Malvin Johns, MD  albuterol (VENTOLIN HFA) 108 (90 Base) MCG/ACT inhaler Inhale 1-2 puffs into the lungs every 6 (six) hours as needed for wheezing or shortness of breath. 02/24/20   Arthor Captain, PA-C  hydrOXYzine (ATARAX/VISTARIL) 25 MG tablet Take 25 mg by mouth every 6 (six) hours as needed for anxiety.    [provider]  ibuprofen (ADVIL) 200 MG tablet  Take 200 mg by mouth every 6 (six) hours as needed for headache, mild pain or moderate pain.    [provider]  methylPREDNISolone (MEDROL DOSEPAK) 4 MG TBPK tablet Use as directed 02/24/20   Arthor Captain, PA-C  Multiple Vitamin (MULTIVITAMIN ADULT PO) Take 1 tablet by mouth daily.    [provider]  traZODone (DESYREL) 50 MG tablet Take 1 tablet (50 mg total) by mouth at bedtime as needed for sleep. 02/14/20   Chales Abrahams, NP    Allergies    Patient has no known allergies.  Review of  Systems   Review of Systems  Constitutional:       Per HPI, otherwise negative  HENT:       Per HPI, otherwise negative  Respiratory:       Per HPI, otherwise negative  Cardiovascular:       Per HPI, otherwise negative  Gastrointestinal: Positive for abdominal pain and nausea. Negative for vomiting.  Endocrine:       Negative aside from HPI  Genitourinary:       Neg aside from HPI   Musculoskeletal:       Per HPI, otherwise negative  Skin: Negative.   Neurological: Negative for syncope.    Physical Exam Updated Vital Signs BP 92/62    Pulse 70    Temp 97.7 F (36.5 C) (Oral)    Resp 18    Ht 6\' 2"  (1.88 m)    Wt 76.2 kg    SpO2 91%    BMI 21.57 kg/m   Physical Exam Vitals and nursing note reviewed. Exam conducted with a chaperone present and nurse tech).  Constitutional:      General: He is not in acute distress.    Appearance: He is well-developed.     Comments: Thin adult male awake and alert, speaking rapidly, slight slurring of speech  HENT:     Head: Normocephalic and atraumatic.  Eyes:     Conjunctiva/sclera: Conjunctivae normal.  Cardiovascular:     Rate and Rhythm: Regular rhythm. Tachycardia present.  Pulmonary:     Effort: Pulmonary effort is normal. No respiratory distress.     Breath sounds: No stridor.  Abdominal:     General: There is no distension.    Genitourinary:   Skin:    General: Skin is warm and dry.  Neurological:     Mental Status: He is alert and oriented to person, place, and time.     Cranial Nerves: Dysarthria present.     Motor: No tremor.  Psychiatric:        Mood and Affect: Mood is anxious.        Speech: Speech is rapid and pressured.     ED Results / Procedures / Treatments   Labs (all labs ordered are listed, but only abnormal results are displayed) Labs Reviewed  COMPREHENSIVE METABOLIC PANEL - Abnormal; Notable for the following components:      Result Value   CO2 19 (*)    All other components within  normal limits  CBC WITH DIFFERENTIAL/PLATELET - Abnormal; Notable for the following components:   Hemoglobin 17.7 (*)    All other components within normal limits  URINALYSIS, ROUTINE W REFLEX MICROSCOPIC - Abnormal; Notable for the following components:   Color, Urine STRAW (*)    Specific Gravity, Urine 1.003 (*)    All other components within normal limits  ETHANOL - Abnormal; Notable for the following components:   Alcohol, Ethyl (B)  217 (*)    All other components within normal limits  LIPASE, BLOOD    Radiology CT ABDOMEN PELVIS W CONTRAST  Result Date: 09/01/2020 CLINICAL DATA:  Bowel obstruction suspected. Right lower quadrant pain. EXAM: CT ABDOMEN AND PELVIS WITH CONTRAST TECHNIQUE: Multidetector CT imaging of the abdomen and pelvis was performed using the standard protocol following bolus administration of intravenous contrast. CONTRAST:  OMNIPAQUE IOHEXOL 300 MG/ML  SOLN COMPARISON:  None. FINDINGS: Lower chest: Scarring in the lung bases.  No acute abnormality. Hepatobiliary: Diffuse low-density throughout the liver compatible with fatty infiltration. No focal abnormality. Gallbladder unremarkable. Pancreas: No focal abnormality or ductal dilatation. Spleen: No focal abnormality.  Normal size. Adrenals/Urinary Tract: No adrenal abnormality. No focal renal abnormality. No stones or hydronephrosis. Urinary bladder is unremarkable. Stomach/Bowel: Normal appendix. No evidence of bowel obstruction. Right colon is fluid-filled, nonspecific. No wall thickening. Vascular/Lymphatic: Calcified aorta and iliac vessels. No evidence of aneurysm or adenopathy. Reproductive: Mildly prominent prostate with central calcifications. Other: No free fluid or free air. Musculoskeletal: No acute bony abnormality. IMPRESSION: Nonobstructive bowel gas pattern.  Normal appendix. Right colon is fluid-filled, nonspecific. This could reflect diarrheal process or gastroenteritis. No wall thickening to suggest  colitis. Aortoiliac atherosclerosis. Prostate enlargement. Electronically Signed   By: Charlett Nose M.D.   On: 09/01/2020 18:52    Procedures Procedures (including critical care time)  Medications Ordered in ED Medications  0.9 %  sodium chloride infusion ( Intravenous New Bag/Given 09/01/20 1706)  fentaNYL (SUBLIMAZE) injection 50 mcg (50 mcg Intravenous Given 09/01/20 1702)  ondansetron (ZOFRAN) injection 4 mg (4 mg Intravenous Given 09/01/20 1702)  iohexol (OMNIPAQUE) 300 MG/ML solution 100 mL (100 mLs Intravenous Contrast Given 09/01/20 1830)    ED Course  I have reviewed the triage vital signs and the nursing notes.  Pertinent labs & imaging results that were available during my care of the patient were reviewed by me and considered in my medical decision making (see chart for details).  8:38 PM Patient awake and alert, ambulatory, much more appropriately interactive.  Has been several hours since his initial arrival. Labs reviewed, was notable for alcohol of 200.  CT reviewed, discussed, no evidence for obstruction, incarcerated hernia, other acute findings. Patient aware of all findings, can follow-up with primary care for his ongoing abdominal pain, but with reassuring CT scan, no evidence for incarceration, entrapment, no vomiting, no negation for admission for this. Patient requests assistance with outpatient follow-up for his polysubstance abuse, and this was facilitated.  Per request patient was discharged.  Final Clinical Impression(s) / ED Diagnoses Final diagnoses:  Alcohol abuse  Right lower quadrant abdominal pain     Gerhard Munch, MD 09/01/20 2045

## 2020-09-01 NOTE — ED Triage Notes (Addendum)
Patent c/o right groin pain x 1 week. Patient states that he lifted something heavy a week ago and has had pain since.  Patient states he has had 6 12 ounce beers today.  Patient stated, "I drove from [facility]." Patient informed that he did not need to drive while intoxicated. Patient stated, "I can drink as much as I want because I am a chronic alcoholic and it does not affect me."

## 2020-09-01 NOTE — Discharge Instructions (Addendum)
As discussed, that his evaluation for your abdominal pain was generally reassuring. However, with your acknowledgment of alcohol abuse, please use the provided resources to pursue counseling, and rehabilitation if needed. Please follow-up with your primary care physician for further evaluation, monitoring and management of your abdominal pain.

## 2020-09-01 NOTE — H&P (Signed)
Behavioral Health Medical Screening Exam  Tim [name] is an 64 y.o. male.  Patient presents voluntarily as a walk-in to the [facility] requesting assistance with detox.  Patient was seen at [facility], ED this afternoon reporting abdominal pain and alcohol abuse.  The patient per chart review was provided with outpatient resources and then requested to be discharged.  Patient has not endorsed any suicidal homicidal ideations and denied any hallucinations.  Patient continues to deny any suicidal or homicidal ideations and denied any hallucinations he continues to report he wants to have assistance with detox.  Patient reports that he has been drinking approximately 12-18 beers a day.  He states that his last drink was today about 1 PM and that was liquor [employer].  Patient reports that [facility], and [facility].  Patient reports also that he was previously been treated at [facility] but missed an appointment has not been back.  He reports being on Vistaril, trazodone, and Lexapro in the past and has not been on any medications.  Patient states it has been a couple years since his last admission for psychiatric treatment.  Patient is informed of [facility] in [place] and states that he is interested in going there.  He states his only concern is that he has a scooter and that is too far to drive on a scooter.  [facility] and [facility] do [facility] beds.  They will contact safe transport to see if patient can get transportation to [facility] bed.  Patient does not meet inpatient psychiatric treatment criteria and is psychiatric cleared.  Total Time spent with patient: 20 minutes  Psychiatric Specialty Exam: Physical Exam Vitals and nursing note reviewed.  Constitutional:      Appearance: He is well-developed.  HENT:     Head: Normocephalic.  Eyes:     Pupils: Pupils are equal, round, and reactive to light.  Cardiovascular:     Rate and Rhythm: Normal rate.   Pulmonary:     Effort: Pulmonary effort is normal.  Musculoskeletal:        General: Normal range of motion.  Neurological:     Mental Status: He is alert and oriented to person, place, and time.    Review of Systems  Constitutional: Negative.   HENT: Negative.   Eyes: Negative.   Respiratory: Negative.   Cardiovascular: Negative.   Gastrointestinal: Negative.   Genitourinary: Negative.   Musculoskeletal: Negative.   Skin: Negative.   Neurological: Negative.    There were no vitals taken for this visit.There is no height or weight on file to calculate BMI. General Appearance: Casual and Fairly Groomed Eye Contact:  Good Speech:  Clear and Coherent and Normal Rate Volume:  Normal Mood:  Euthymic Affect:  Appropriate and Congruent Thought Process:  Coherent and Descriptions of Associations: Intact Orientation:  Full (Time, Place, and Person) Thought Content:  WDL Suicidal Thoughts:  No Homicidal Thoughts:  No Memory:  Immediate;   Good Recent;   Good Remote;   Good Judgement:  Good Insight:  Good Psychomotor Activity:  Normal Concentration: Concentration: Good Recall:  Good Fund of Knowledge:Good Language: Good Akathisia:  No Handed:  Right AIMS (if indicated):    Assets:  Communication Skills Desire for Improvement Financial Resources/Insurance Housing Physical Health Social Support Transportation Sleep:     Musculoskeletal: Strength & Muscle Tone: within normal limits Gait & Station: normal Patient leans: N/A  There were no  vitals taken for this visit.  BP 120/73, Pulse 79, O2 95  Recommendations: Based on my evaluation the patient does not appear to have an emergency medical condition.  Gerlene Burdock Piccola [place], FNP 09/02/2020, 12:06 AM

## 2020-09-02 NOTE — BH Assessment (Signed)
0- .*[facility] Organ Note  Tim [name] is an 64 y.o. male presenting voluntarily to [facility] requesting alcohol detox. Patient denied SI, HI and psychosis. Patient was seen at [facility], ED this afternoon reporting abdominal pain and alcohol abuse.  The patient per chart review was provided with outpatient resources and then requested to be discharged. Patient reports that he has been drinking approximately 12-18 beers a daily with last drink today approx 1pm, liquor [employer].    Patient has history of substance rehab at [facility], [facility] and [facility]. Patient reports also that he was previously been treated at [facility] but missed an appointment has not been back.  He reports being on Vistaril, trazodone, and Lexapro in the past and has not been on any medications. Patient reported no access to guns. Patient reported poor support system. Patient was calm and cooperative during assessment.   Disposition Reola Calkins, NP, psych cleared and referred to [facility] in [place] for rehab. Safe transport will transport patient. TTS spoke with [facility], stated they have beds, however bed not guaranteed, information shared with patient. Patient agreed with plan.  Diagnosis: Alcohol dependence  Past Medical History:  Past Medical History:  Diagnosis Date   Alcoholism (HCC)    Anxiety    COPD (chronic obstructive pulmonary disease) (HCC)    Depression    Hep C w/o coma, chronic (HCC)    History of hiatal hernia    hernia rt groin   Mental health disorder    Scoliosis    Thyroid disease     Past Surgical History:  Procedure Laterality Date   SKIN CANCER EXCISION     THYROID SURGERY      Family History:  Family History  Problem Relation Age of Onset   Dementia Mother     Social History:  reports that he has been smoking cigarettes. He has a 40.00 pack-year smoking history. He has never used smokeless tobacco. He reports current alcohol use. He reports current drug use. Drugs: Cocaine and  Marijuana.  Additional Social History:  Alcohol / Drug Use Pain Medications: see MAR Prescriptions: see MAR Over the Counter: see MAR  CIWA:   COWS:    Allergies: No Known Allergies  Home Medications: (Not in a hospital admission)   OB/GYN Status:  No LMP for male patient.  General Assessment Data Location of Assessment:  [facility]) TTS Assessment: In system Is this a Tele or Face-to-Face Assessment?: Face-to-Face Is this an Initial Assessment or a Re-assessment for this encounter?: Initial Assessment Patient Accompanied by:: N/A Language Other than English: No Living Arrangements:  (family home) What gender do you identify as?: Male Marital status: Single Pregnancy Status: No Living Arrangements: Parent (with mother) Can pt return to current living arrangement?: Yes Admission Status: Voluntary Is patient capable of signing voluntary admission?: Yes Referral Source: Self/Family/Friend  Medical Screening Exam [facility] Walk-in ONLY) Medical Exam completed: Yes  Crisis Care Plan Living Arrangements: Parent (with mother) Legal Guardian:  (self) Name of Psychiatrist:  (none) Name of Therapist:  (none)  Education Status Is patient currently in school?: No Is the patient employed, unemployed or receiving disability?: Unemployed  Risk to self with the past 6 months Suicidal Ideation: No Has patient been a risk to self within the past 6 months prior to admission? : No Suicidal Intent: No Has patient had any suicidal intent within the past 6 months prior to admission? : No Is patient at risk for suicide?: No Suicidal Plan?: No Has patient had any suicidal  plan within the past 6 months prior to admission? : No Access to Means: No What has been your use of drugs/alcohol within the last 12 months?:  (alcohol) Previous Attempts/Gestures: No How many times?:  (0) Other Self Harm Risks:  (none) Triggers for Past Attempts:  (n/a) Intentional Self Injurious Behavior:  None Family Suicide History: No Recent stressful life event(s):  (alcohol) Persecutory voices/beliefs?: No Depression: Yes Depression Symptoms: Fatigue, Insomnia, Loss of interest in usual pleasures, Feeling worthless/self pity, Guilt Substance abuse history and/or treatment for substance abuse?: No Suicide prevention information given to non-admitted patients: Not applicable  Risk to Others within the past 6 months Homicidal Ideation: No Does patient have any lifetime risk of violence toward others beyond the six months prior to admission? : No Thoughts of Harm to Others: No Current Homicidal Intent: No Current Homicidal Plan: No Access to Homicidal Means: No History of harm to others?: No Assessment of Violence: None Noted Violent Behavior Description:  (none) Does patient have access to weapons?: No Criminal Charges Pending?: No Does patient have a court date: No Is patient on probation?: No  Psychosis Hallucinations: None noted Delusions: None noted  Mental Status Report Appearance/Hygiene: Unremarkable Eye Contact: Good Motor Activity: Freedom of movement Speech: Logical/coherent Level of Consciousness: Alert Mood: Depressed, Sad Affect: Appropriate to circumstance, Depressed, Sad Anxiety Level: Moderate Thought Processes: Coherent, Relevant Judgement: Unimpaired Orientation: Person, Place, Time, Situation Obsessive Compulsive Thoughts/Behaviors: None  Cognitive Functioning Concentration: Normal Memory: Recent Intact Is patient IDD: No Insight: Fair Impulse Control: Fair Appetite: Fair Have you had any weight changes? : No Change Sleep: No Change Total Hours of Sleep:  (unable to assess) Vegetative Symptoms: None  ADLScreening [facility]) Patient's cognitive ability adequate to safely complete daily activities?: Yes Patient able to express need for assistance with ADLs?: Yes Independently performs ADLs?: Yes (appropriate for developmental  age)  Prior Inpatient Therapy Prior Inpatient Therapy: No  Prior Outpatient Therapy Prior Outpatient Therapy: Yes Prior Therapy Dates:  (06/2020) Prior Therapy Facilty/Provider(s):  [facility]) Reason for Treatment:  (substance abuse) Does patient have an ACCT team?: No Does patient have Intensive In-House Services?  : No Does patient have [facility] services? : Yes Does patient have P4CC services?: No  ADL Screening (condition at time of admission) Patient's cognitive ability adequate to safely complete daily activities?: Yes Patient able to express need for assistance with ADLs?: Yes Independently performs ADLs?: Yes (appropriate for developmental age)  Disposition:  Disposition Initial Assessment Completed for this Encounter: Yes  Reola Calkins, NP, patient does not meet inpatient criteria. TTS staff contacted [facility] in [place] and they do have available beds. Safe transport  Contacted and will provide patient with transportation to potential [facility] bed. Prior to leaving, patient stated he will comeback in the AM as he will have transportation back if they don't accept him into that bed, verses being stranded their at this time of morning. Patient does not meet inpatient psychiatric treatment criteria and is psychiatric cleared.  On Site Evaluation by:   Reviewed with Physician:    Burnetta Sabin 09/02/2020 3:47 AM

## 2020-10-16 ENCOUNTER — Other Ambulatory Visit: Payer: Self-pay

## 2020-10-16 ENCOUNTER — Encounter (HOSPITAL_COMMUNITY): Payer: Self-pay

## 2020-10-16 DIAGNOSIS — K4090 Unilateral inguinal hernia, without obstruction or gangrene, not specified as recurrent: Secondary | ICD-10-CM | POA: Diagnosis not present

## 2020-10-16 DIAGNOSIS — Z79899 Other long term (current) drug therapy: Secondary | ICD-10-CM | POA: Insufficient documentation

## 2020-10-16 DIAGNOSIS — Z85828 Personal history of other malignant neoplasm of skin: Secondary | ICD-10-CM | POA: Diagnosis not present

## 2020-10-16 DIAGNOSIS — R109 Unspecified abdominal pain: Secondary | ICD-10-CM | POA: Diagnosis present

## 2020-10-16 DIAGNOSIS — F17210 Nicotine dependence, cigarettes, uncomplicated: Secondary | ICD-10-CM | POA: Diagnosis not present

## 2020-10-16 DIAGNOSIS — J449 Chronic obstructive pulmonary disease, unspecified: Secondary | ICD-10-CM | POA: Insufficient documentation

## 2020-10-16 LAB — COMPREHENSIVE METABOLIC PANEL
ALT: 17 U/L (ref 0–44)
AST: 23 U/L (ref 15–41)
Albumin: 4.2 g/dL (ref 3.5–5.0)
Alkaline Phosphatase: 59 U/L (ref 38–126)
Anion gap: 9 (ref 5–15)
BUN: 17 mg/dL (ref 8–23)
CO2: 23 mmol/L (ref 22–32)
Calcium: 10.8 mg/dL — ABNORMAL HIGH (ref 8.9–10.3)
Chloride: 105 mmol/L (ref 98–111)
Creatinine, Ser: 1.1 mg/dL (ref 0.61–1.24)
GFR, Estimated: >60 mL/min (ref >60)
Glucose, Bld: 134 mg/dL — ABNORMAL HIGH (ref 70–99)
Potassium: 3.7 mmol/L (ref 3.5–5.1)
Sodium: 137 mmol/L (ref 135–145)
Total Bilirubin: 0.8 mg/dL (ref 0.3–1.2)
Total Protein: 6.6 g/dL (ref 6.5–8.1)

## 2020-10-16 LAB — CBC
HCT: 48.4 % (ref 39.0–52.0)
Hemoglobin: 16.8 g/dL (ref 13.0–17.0)
MCH: 32.7 pg (ref 26.0–34.0)
MCHC: 34.7 g/dL (ref 30.0–36.0)
MCV: 94.3 fL (ref 80.0–100.0)
Platelets: 157 10*3/uL (ref 150–400)
RBC: 5.13 MIL/uL (ref 4.22–5.81)
RDW: 13.2 % (ref 11.5–15.5)
WBC: 5.7 10*3/uL (ref 4.0–10.5)
nRBC: 0 % (ref 0.0–0.2)

## 2020-10-16 LAB — URINALYSIS, ROUTINE W REFLEX MICROSCOPIC
Bilirubin Urine: NEGATIVE
Glucose, UA: NEGATIVE mg/dL
Hgb urine dipstick: NEGATIVE
Ketones, ur: NEGATIVE mg/dL
Leukocytes,Ua: NEGATIVE
Nitrite: NEGATIVE
Protein, ur: NEGATIVE mg/dL
Specific Gravity, Urine: 1.014 (ref 1.005–1.030)
pH: 5 (ref 5.0–8.0)

## 2020-10-16 NOTE — ED Notes (Signed)
Pt was outside smoking a cigarette prior to this Regulatory affairs officer vitals @2316 

## 2020-10-16 NOTE — ED Triage Notes (Signed)
Patient c/o right groin pain. Patient reports a known hernia and states it has gotten bigger and more painful.

## 2020-10-17 ENCOUNTER — Emergency Department (HOSPITAL_COMMUNITY)
Admission: EM | Admit: 2020-10-17 | Discharge: 2020-10-17 | Disposition: A | Discharge status: Home / Self Care | Payer: Medicaid Other | Attending: Emergency Medicine | Admitting: Emergency Medicine

## 2020-10-17 DIAGNOSIS — K4090 Unilateral inguinal hernia, without obstruction or gangrene, not specified as recurrent: Secondary | ICD-10-CM

## 2020-10-17 NOTE — ED Provider Notes (Signed)
[name] Provider Note   CSN: [id number] Arrival date & time: 10/16/20  1416     History Chief Complaint  Patient presents with  . Groin Pain    Tim [name] is a 65 y.o. male.  HPI 65 year old male history of alcoholism, COPD, hiatal hernia presents today complaining of right sided inguinal hernia.  He states he has had some problems with this recently.  Today, his scooter fell over when he went to pick it up he denies some increase in the size of this.  There is some pain noted.  There is no abdominal pain, nausea, or vomiting.  He did not attempt any interventions.  The pain is mild to moderate and worsens with straining.  He has been seen for this once before referred to a surgeon, but reports that he lost his paperwork.  His primary care doctor is Dr. Oran [name] and he has an appointment next week for follow-up.    Past Medical History:  Diagnosis Date  . Alcoholism (HCC)   . Anxiety   . COPD (chronic obstructive pulmonary disease) (HCC)   . Depression   . Hep C w/o coma, chronic (HCC)   . History of hiatal hernia    hernia rt groin  . Mental health disorder   . Scoliosis   . Thyroid disease     Patient Active Problem List   Diagnosis Date Noted  . Substance induced mood disorder (HCC) 02/14/2020  . MDD (major depressive disorder), severe (HCC) 04/10/2019  . 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 Cocaine abuse with cocaine-induced mood disorder (HCC) 01/29/2018  . Polysubstance dependence (HCC) 01/29/2018  . Severe recurrent major depression without psychotic features (HCC) 08/20/2017  . Severe episode of recurrent major depressive disorder, without psychotic features (HCC) 07/08/2016  . MDD (major depressive disorder), recurrent severe, without psychosis (HCC) 05/23/2016  . Hepatitis C 05/23/2016  . Alcohol use disorder, severe, dependence (HCC) 11/05/2015  . Depression, major,  recurrent, moderate (HCC) 11/05/2015  . GAD (generalized anxiety disorder) 11/05/2015  . Cellulitis of right lower extremity 03/29/2015    Past Surgical History:  Procedure Laterality Date  . SKIN CANCER EXCISION    . THYROID SURGERY         Family History  Problem Relation Age of Onset  . Dementia Mother     Social History   Tobacco Use  . Smoking status: Current Every Day Smoker    Packs/day: 0.50    Years: 40.00    Pack years: 20.00    Types: Cigarettes  . Smokeless tobacco: Never Used  . Tobacco comment: Patient refused  Vaping Use  . Vaping Use: Never used  Substance Use Topics  . Alcohol use: Yes  . Drug use: Yes    Types: Cocaine, Marijuana    Home Medications Prior to Admission medications   Medication Sig Start Date End Date Taking? Authorizing Provider  acamprosate (CAMPRAL) 333 MG tablet Take 2 tablets (666 mg total) by mouth 3 (three) times daily. Patient not taking: Reported on 02/14/2020 04/15/19   Tim Johns, MD  albuterol (VENTOLIN HFA) 108 (90 Base) MCG/ACT inhaler Inhale 1-2 puffs into the lungs every 6 (six) hours as needed for wheezing or shortness of breath. 02/24/20   Arthor Captain, PA-C  hydrOXYzine (ATARAX/VISTARIL) 25 MG tablet Take 25 mg by mouth every 6 (six) hours as needed for anxiety.    [provider]  ibuprofen (ADVIL) 200 MG tablet Take 200  mg by mouth every 6 (six) hours as needed for headache, mild pain or moderate pain.    [provider]  methylPREDNISolone (MEDROL DOSEPAK) 4 MG TBPK tablet Use as directed 02/24/20   Arthor Captain, PA-C  Multiple Vitamin (MULTIVITAMIN ADULT PO) Take 1 tablet by mouth daily.    [provider]  traZODone (DESYREL) 50 MG tablet Take 1 tablet (50 mg total) by mouth at bedtime as needed for sleep. 02/14/20   Chales Abrahams, NP    Allergies    Patient has no known allergies.  Review of Systems   Review of Systems  All other systems reviewed and are  negative.   Physical Exam Updated Vital Signs BP 117/81 (BP Location: Left Arm)   Pulse 74   Temp 97.9 F (36.6 C) (Oral)   Resp 19   Ht 1.892 m (6' 2.5")   Wt 78.9 kg   SpO2 100%   BMI 22.04 kg/m   Physical Exam Vitals and nursing note reviewed.  Constitutional:      General: He is not in acute distress.    Appearance: Normal appearance. He is not ill-appearing.  HENT:     Head: Normocephalic.     Right Ear: External ear normal.     Left Ear: External ear normal.     Nose: Nose normal.  Eyes:     Pupils: Pupils are equal, round, and reactive to light.     Comments: Disconjugate gaze  Cardiovascular:     Rate and Rhythm: Normal rate.     Pulses: Normal pulses.  Pulmonary:     Effort: Pulmonary effort is normal.  Abdominal:     General: Abdomen is flat. Bowel sounds are normal. There is no distension.     Palpations: Abdomen is soft.     Tenderness: There is no abdominal tenderness.  Genitourinary:    Penis: Normal.      Comments: Right inguinal hernia which is small but is not easily reduced.  Appears to be mostly fatty tissue no bowel sounds are noted Musculoskeletal:        General: Normal range of motion.     Cervical back: Normal range of motion.  Skin:    General: Skin is warm and dry.     Capillary Refill: Capillary refill takes less than 2 seconds.  Neurological:     General: No focal deficit present.     Mental Status: He is alert.  Psychiatric:        Mood and Affect: Mood normal.     ED Results / Procedures / Treatments   Labs (all labs ordered are listed, but only abnormal results are displayed) Labs Reviewed  COMPREHENSIVE METABOLIC PANEL - Abnormal; Notable for the following components:      Result Value   Glucose, Bld 134 (*)    Calcium 10.8 (*)    All other components within normal limits  CBC  URINALYSIS, ROUTINE W REFLEX MICROSCOPIC    EKG None  Radiology No results found.  Procedures Procedures (including critical care  time)  Medications Ordered in ED Medications - No data to display  ED Course  I have reviewed the triage vital signs and the nursing notes.  Pertinent labs & imaging results that were available during my care of the patient were reviewed by me and considered in my medical decision making (see chart for details).    MDM Rules/Calculators/A&P  65 year old man presents with right inguinal hernia does not appear to be incarcerated and no signs of intra-abdominal abnormality.  Inguinal hernia is small at this time.  We discussed return precautions and need for follow-up and he voices understanding.  We also discussed Covid vaccine and he did ask about where to get this.  I directed him to the appropriate websites. Final Clinical Impression(s) / ED Diagnoses Final diagnoses:  Right inguinal hernia    Rx / DC Orders ED Discharge Orders    None       Pattricia Boss, MD 10/17/20 [phone]

## 2020-10-17 NOTE — Discharge Instructions (Addendum)
[email or web address] Please return if increased pain, swelling, or abdominal pain. Use the above web site to find a vaccine near you. Call Dr. Doreen Salvage office for follow up

## 2020-10-24 ENCOUNTER — Other Ambulatory Visit: Payer: Medicaid Other

## 2020-10-28 ENCOUNTER — Other Ambulatory Visit: Payer: Self-pay

## 2020-10-28 ENCOUNTER — Emergency Department (HOSPITAL_COMMUNITY)
Admission: EM | Admit: 2020-10-28 | Discharge: 2020-10-28 | Disposition: A | Discharge status: Home / Self Care | Payer: Medicaid Other | Attending: Emergency Medicine | Admitting: Emergency Medicine

## 2020-10-28 ENCOUNTER — Encounter (HOSPITAL_COMMUNITY): Payer: Self-pay

## 2020-10-28 DIAGNOSIS — R112 Nausea with vomiting, unspecified: Secondary | ICD-10-CM | POA: Diagnosis not present

## 2020-10-28 DIAGNOSIS — R0602 Shortness of breath: Secondary | ICD-10-CM | POA: Diagnosis not present

## 2020-10-28 DIAGNOSIS — Z5321 Procedure and treatment not carried out due to patient leaving prior to being seen by health care provider: Secondary | ICD-10-CM | POA: Diagnosis not present

## 2020-10-28 DIAGNOSIS — R110 Nausea: Secondary | ICD-10-CM | POA: Diagnosis not present

## 2020-10-28 DIAGNOSIS — R1111 Vomiting without nausea: Secondary | ICD-10-CM | POA: Diagnosis not present

## 2020-10-28 NOTE — ED Triage Notes (Signed)
Patient arrived via [facility] with complaints of NV that started today. Given IV zofran prior to arrival. No complaints of pain at this time.

## 2020-10-29 ENCOUNTER — Ambulatory Visit: Payer: Medicaid Other

## 2020-12-14 DIAGNOSIS — K4090 Unilateral inguinal hernia, without obstruction or gangrene, not specified as recurrent: Secondary | ICD-10-CM | POA: Diagnosis not present

## 2021-03-09 DIAGNOSIS — I959 Hypotension, unspecified: Secondary | ICD-10-CM | POA: Diagnosis not present

## 2021-03-10 ENCOUNTER — Encounter (HOSPITAL_COMMUNITY): Payer: Self-pay

## 2021-03-10 ENCOUNTER — Other Ambulatory Visit: Payer: Self-pay

## 2021-03-10 ENCOUNTER — Emergency Department (HOSPITAL_COMMUNITY)
Admission: EM | Admit: 2021-03-10 | Discharge: 2021-03-10 | Disposition: A | Discharge status: Home / Self Care | Payer: Medicaid Other | Attending: Emergency Medicine | Admitting: Emergency Medicine

## 2021-03-10 DIAGNOSIS — J449 Chronic obstructive pulmonary disease, unspecified: Secondary | ICD-10-CM | POA: Insufficient documentation

## 2021-03-10 DIAGNOSIS — F17210 Nicotine dependence, cigarettes, uncomplicated: Secondary | ICD-10-CM | POA: Insufficient documentation

## 2021-03-10 DIAGNOSIS — K4090 Unilateral inguinal hernia, without obstruction or gangrene, not specified as recurrent: Secondary | ICD-10-CM | POA: Diagnosis not present

## 2021-03-10 NOTE — Discharge Instructions (Addendum)
Your hernia cannot be reduced tonight.  It is not dangerous unless it causes an obstruction.  If you start having pain in that area, or start vomiting, then return to the emergency department for further evaluation.  Please follow-up with the surgeon to discuss the pros and cons of having your hernia repaired.

## 2021-03-10 NOTE — ED Notes (Signed)
Pt given urinal at bedside.

## 2021-03-10 NOTE — ED Provider Notes (Signed)
[facility]-EMERGENCY DEPT Provider Note   CSN: [id number] Arrival date & time: 03/10/21  0030     History Chief complaint: Right inguinal hernia  Tim [name] is a 65 y.o. male.  The history is provided by the patient.  He has a history of COPD, depression, alcohol abuse and comes in because of a right inguinal hernia.  He states that he has had the hernia for some time and he is usually able to reduce it.  Tonight, it popped out and he was unable to put it back.  He denies any pain in the inguinal area or abdomen and denies nausea and vomiting.  He denies any unusual lifting, straining.   Past Medical History:  Diagnosis Date  . Alcoholism (HCC)   . Anxiety   . COPD (chronic obstructive pulmonary disease) (HCC)   . Depression   . Hep C w/o coma, chronic (HCC)   . History of hiatal hernia    hernia rt groin  . Mental health disorder   . Scoliosis   . Thyroid disease     Patient Active Problem List   Diagnosis Date Noted  . Substance induced mood disorder (HCC) 02/14/2020  . MDD (major depressive disorder), severe (HCC) 04/10/2019  . 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 Cocaine abuse with cocaine-induced mood disorder (HCC) 01/29/2018  . Polysubstance dependence (HCC) 01/29/2018  . Severe recurrent major depression without psychotic features (HCC) 08/20/2017  . Severe episode of recurrent major depressive disorder, without psychotic features (HCC) 07/08/2016  . MDD (major depressive disorder), recurrent severe, without psychosis (HCC) 05/23/2016  . Hepatitis C 05/23/2016  . Alcohol use disorder, severe, dependence (HCC) 11/05/2015  . Depression, major, recurrent, moderate (HCC) 11/05/2015  . GAD (generalized anxiety disorder) 11/05/2015  . Cellulitis of right lower extremity 03/29/2015    Past Surgical History:  Procedure Laterality Date  . SKIN CANCER EXCISION    . THYROID SURGERY          Family History  Problem Relation Age of Onset  . Dementia Mother     Social History   Tobacco Use  . Smoking status: Current Every Day Smoker    Packs/day: 0.50    Years: 40.00    Pack years: 20.00    Types: Cigarettes  . Smokeless tobacco: Never Used  . Tobacco comment: Patient refused  Vaping Use  . Vaping Use: Never used  Substance Use Topics  . Alcohol use: Yes    Alcohol/week: 6.0 - 7.0 standard drinks    Types: 6 - 7 Cans of beer per week  . Drug use: Yes    Types: Cocaine, Marijuana    Home Medications Prior to Admission medications   Medication Sig Start Date End Date Taking? Authorizing Provider  acamprosate (CAMPRAL) 333 MG tablet Take 2 tablets (666 mg total) by mouth 3 (three) times daily. Patient not taking: Reported on 02/14/2020 04/15/19   Malvin Johns, MD  albuterol (VENTOLIN HFA) 108 (90 Base) MCG/ACT inhaler Inhale 1-2 puffs into the lungs every 6 (six) hours as needed for wheezing or shortness of breath. 02/24/20   Arthor Captain, PA-C  hydrOXYzine (ATARAX/VISTARIL) 25 MG tablet Take 25 mg by mouth every 6 (six) hours as needed for anxiety.    [provider]  ibuprofen (ADVIL) 200 MG tablet Take 200 mg by mouth every 6 (six) hours as needed for headache, mild pain or moderate pain.    [provider]  methylPREDNISolone (MEDROL  DOSEPAK) 4 MG TBPK tablet Use as directed 02/24/20   Arthor Captain, PA-C  Multiple Vitamin (MULTIVITAMIN ADULT PO) Take 1 tablet by mouth daily.    [provider]  traZODone (DESYREL) 50 MG tablet Take 1 tablet (50 mg total) by mouth at bedtime as needed for sleep. 02/14/20   Chales Abrahams, NP    Allergies    Patient has no known allergies.  Review of Systems   Review of Systems  All other systems reviewed and are negative.   Physical Exam Updated Vital Signs BP 126/88 (BP Location: Left Arm)   Pulse 68   Temp 98 F (36.7 C)   Resp 18   Ht 6\' 2"  (1.88 m)   Wt 68 kg   SpO2 94%    BMI 19.26 kg/m   Physical Exam Vitals and nursing note reviewed.   65 year old male, resting comfortably and in no acute distress. Vital signs are normal. Oxygen saturation is 94%, which is normal. Head is normocephalic and atraumatic. PERRLA, EOMI. Oropharynx is clear. Neck is nontender and supple without adenopathy or JVD. Back is nontender and there is no CVA tenderness. Lungs are clear without rales, wheezes, or rhonchi. Chest is nontender. Heart has regular rate and rhythm without murmur. Abdomen is soft, flat, nontender without masses or hepatosplenomegaly and peristalsis is normoactive. Genitalia: Circumcised penis.  Testes descended.  Right direct inguinal hernia present.  It is not very large, and is nontender.  Consistency of the hernia is more consistent with fat than bowel. Extremities have no cyanosis or edema, full range of motion is present. Skin is warm and dry without rash. Neurologic: Mental status is normal, cranial nerves are intact, there are no motor or sensory deficits.  ED Results / Procedures / Treatments    Procedures Procedures   Medications Ordered in ED Medications - No data to display  ED Course  I have reviewed the triage vital signs and the nursing notes.  MDM Rules/Calculators/A&P                         Direct right inguinal hernia.  An attempt was made to reduce the hernia, but it would not stay reduced.  It is nontender with palpation or with attempt at reduction.  I have explained to the patient that his likely to stay out.  He is referred to general surgery for consideration of elective herniorrhaphy.  Patient was asked about the possibility of getting a truss.  Advised that at this point, I did not think it would be helpful for him.  Old records are reviewed, and he does have a prior ED visit for the hernia.  Final Clinical Impression(s) / ED Diagnoses Final diagnoses:  Right inguinal hernia    Rx / DC Orders ED Discharge Orders    None        66, MD 03/10/21 [phone]

## 2021-03-10 NOTE — ED Triage Notes (Signed)
Pt has previously dx hernia on right side of abdomen. Pt received 500 ns bolus.

## 2021-03-10 NOTE — ED Triage Notes (Addendum)
Pt states hernia "popped out" while he was lifting a motorcycle for repairs. Usually he can push it back in. Tonight he was unsuccessful. Hernia is in right side of groin.

## 2021-03-10 NOTE — ED Notes (Signed)
Pt verbalized understanding of d/c and follow up care. 

## 2021-05-07 ENCOUNTER — Emergency Department (HOSPITAL_COMMUNITY)
Admission: EM | Admit: 2021-05-07 | Discharge: 2021-05-07 | Discharge status: Against Medical Advice | Payer: Medicaid Other | Attending: Emergency Medicine | Admitting: Emergency Medicine

## 2021-05-07 ENCOUNTER — Encounter (HOSPITAL_COMMUNITY): Payer: Self-pay

## 2021-05-07 ENCOUNTER — Ambulatory Visit (HOSPITAL_COMMUNITY)
Admission: RE | Admit: 2021-05-07 | Discharge: 2021-05-07 | Disposition: A | Discharge status: Home / Self Care | Payer: Medicaid Other | Attending: Psychiatry | Admitting: Psychiatry

## 2021-05-07 ENCOUNTER — Other Ambulatory Visit: Payer: Self-pay

## 2021-05-07 DIAGNOSIS — F32A Depression, unspecified: Secondary | ICD-10-CM

## 2021-05-07 DIAGNOSIS — F17210 Nicotine dependence, cigarettes, uncomplicated: Secondary | ICD-10-CM | POA: Insufficient documentation

## 2021-05-07 DIAGNOSIS — Z7289 Other problems related to lifestyle: Secondary | ICD-10-CM

## 2021-05-07 DIAGNOSIS — R45851 Suicidal ideations: Secondary | ICD-10-CM | POA: Insufficient documentation

## 2021-05-07 DIAGNOSIS — F1020 Alcohol dependence, uncomplicated: Secondary | ICD-10-CM | POA: Insufficient documentation

## 2021-05-07 DIAGNOSIS — Z79899 Other long term (current) drug therapy: Secondary | ICD-10-CM | POA: Diagnosis not present

## 2021-05-07 DIAGNOSIS — F419 Anxiety disorder, unspecified: Secondary | ICD-10-CM | POA: Insufficient documentation

## 2021-05-07 DIAGNOSIS — F1099 Alcohol use, unspecified with unspecified alcohol-induced disorder: Secondary | ICD-10-CM | POA: Insufficient documentation

## 2021-05-07 DIAGNOSIS — J449 Chronic obstructive pulmonary disease, unspecified: Secondary | ICD-10-CM | POA: Insufficient documentation

## 2021-05-07 DIAGNOSIS — F1090 Alcohol use, unspecified, uncomplicated: Secondary | ICD-10-CM

## 2021-05-07 DIAGNOSIS — F1490 Cocaine use, unspecified, uncomplicated: Secondary | ICD-10-CM | POA: Insufficient documentation

## 2021-05-07 DIAGNOSIS — F1290 Cannabis use, unspecified, uncomplicated: Secondary | ICD-10-CM | POA: Insufficient documentation

## 2021-05-07 DIAGNOSIS — Z789 Other specified health status: Secondary | ICD-10-CM

## 2021-05-07 LAB — CBC WITH DIFFERENTIAL/PLATELET
Abs Immature Granulocytes: 0.01 10*3/uL (ref 0.00–0.07)
Basophils Absolute: 0.1 10*3/uL (ref 0.0–0.1)
Basophils Relative: 1 %
Eosinophils Absolute: 0.2 10*3/uL (ref 0.0–0.5)
Eosinophils Relative: 3 %
HCT: 39.7 % (ref 39.0–52.0)
Hemoglobin: 13.9 g/dL (ref 13.0–17.0)
Immature Granulocytes: 0 %
Lymphocytes Relative: 27 %
Lymphs Abs: 1.5 10*3/uL (ref 0.7–4.0)
MCH: 32 pg (ref 26.0–34.0)
MCHC: 35 g/dL (ref 30.0–36.0)
MCV: 91.5 fL (ref 80.0–100.0)
Monocytes Absolute: 0.4 10*3/uL (ref 0.1–1.0)
Monocytes Relative: 8 %
Neutro Abs: 3.3 10*3/uL (ref 1.7–7.7)
Neutrophils Relative %: 61 %
Platelets: 207 10*3/uL (ref 150–400)
RBC: 4.34 MIL/uL (ref 4.22–5.81)
RDW: 12.4 % (ref 11.5–15.5)
WBC: 5.5 10*3/uL (ref 4.0–10.5)
nRBC: 0 % (ref 0.0–0.2)

## 2021-05-07 LAB — COMPREHENSIVE METABOLIC PANEL
ALT: 13 U/L (ref 0–44)
AST: 18 U/L (ref 15–41)
Albumin: 3.9 g/dL (ref 3.5–5.0)
Alkaline Phosphatase: 71 U/L (ref 38–126)
Anion gap: 7 (ref 5–15)
BUN: 9 mg/dL (ref 8–23)
CO2: 22 mmol/L (ref 22–32)
Calcium: 10.1 mg/dL (ref 8.9–10.3)
Chloride: 106 mmol/L (ref 98–111)
Creatinine, Ser: 0.78 mg/dL (ref 0.61–1.24)
GFR, Estimated: >60 mL/min (ref >60)
Glucose, Bld: 99 mg/dL (ref 70–99)
Potassium: 3.8 mmol/L (ref 3.5–5.1)
Sodium: 135 mmol/L (ref 135–145)
Total Bilirubin: 0.6 mg/dL (ref 0.3–1.2)
Total Protein: 6.2 g/dL — ABNORMAL LOW (ref 6.5–8.1)

## 2021-05-07 LAB — ETHANOL: Alcohol, Ethyl (B): 190 mg/dL — ABNORMAL HIGH (ref <10)

## 2021-05-07 LAB — SALICYLATE LEVEL: Salicylate Lvl: <7 mg/dL — ABNORMAL LOW (ref 7.0–30.0)

## 2021-05-07 LAB — ACETAMINOPHEN LEVEL: Acetaminophen (Tylenol), Serum: <10 ug/mL — ABNORMAL LOW (ref 10–30)

## 2021-05-07 MED ORDER — LORAZEPAM 1 MG PO TABS: 0.0000 mg | ORAL_TABLET | Freq: Four times a day (QID) | ORAL | Status: DC

## 2021-05-07 MED ORDER — LORAZEPAM 1 MG PO TABS: 0.0000 mg | ORAL_TABLET | Freq: Two times a day (BID) | ORAL | Status: DC

## 2021-05-07 MED ORDER — THIAMINE HCL 100 MG/ML IJ SOLN: 100.0000 mg | Freq: Every day | INTRAMUSCULAR | Status: DC

## 2021-05-07 MED ORDER — THIAMINE HCL 100 MG PO TABS: 100.0000 mg | ORAL_TABLET | Freq: Every day | ORAL | Status: DC

## 2021-05-07 MED ORDER — LORAZEPAM 2 MG/ML IJ SOLN
0.0000 mg | Freq: Four times a day (QID) | INTRAMUSCULAR | Status: DC
Start: 2021-05-07 — End: 2021-05-08

## 2021-05-07 MED ORDER — LORAZEPAM 2 MG/ML IJ SOLN
0.0000 mg | Freq: Two times a day (BID) | INTRAMUSCULAR | Status: DC
Start: 2021-05-10 — End: 2021-05-08

## 2021-05-07 NOTE — ED Provider Notes (Signed)
[facility]-EMERGENCY DEPT Provider Note   CSN: [id number] Arrival date & time: 05/07/21  1702     History Chief Complaint  Patient presents with   Mental Health Problem    Tim [name] is a 65 y.o. male past medical history significant for COPD, known inguinal hernia, substance use, chronic EtOH use who presents for evaluation of needing a mental health evaluation.  Patient states he feels depressed, is not sleeping.  Has been drinking alcohol to cope.  Patient vague when asking about suicidal ideation.  States I have had some thoughts in the past but none currently.  He will not disclose how recent.  He denies any HI.  States when he uses illicit substances he occasionally hears voices however none currently.  Feels like he needs to get some medication for his depression and his anxiety.  Has known history of inguinal hernia however denies any current pain.  No headache, lightness, dizziness, chest pain, shortness of breath, abdominal pain.  Denies additional aggravating or alleviating factors.  Drinks approximately 6 beers daily.  He denies any prior history of DTs or withdrawal seizures.  History taken from patient and past medical records.  No interpreter used.  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/2018   Cocaine abuse with cocaine-induced mood disorder (HCC) 01/29/2018   Polysubstance dependence (HCC) 01/29/2018   Severe recurrent major depression without psychotic  features (HCC) 08/20/2017   Severe episode of recurrent major depressive disorder, without psychotic features (HCC) 07/08/2016   MDD (major depressive disorder), recurrent severe, without psychosis (HCC) 05/23/2016   Hepatitis C 05/23/2016   Alcohol use disorder, severe, dependence (HCC) 11/05/2015   Depression, major, recurrent, moderate (HCC) 11/05/2015   GAD (generalized anxiety disorder) 11/05/2015   Cellulitis of right lower extremity 03/29/2015    Past Surgical History:  Procedure Laterality Date   SKIN CANCER EXCISION     THYROID SURGERY         Family History  Problem Relation Age of Onset   Dementia Mother     Social History   Tobacco Use   Smoking status: Every Day    Packs/day: 0.50    Years: 40.00    Pack years: 20.00    Types: Cigarettes   Smokeless tobacco: Never   Tobacco comments:    Patient refused  Vaping Use   Vaping Use: Never used  Substance Use Topics   Alcohol use: Not Currently   Drug use: Yes    Types: Marijuana    Home Medications Prior to Admission medications   Medication Sig Start Date End Date Taking? Authorizing Provider  acamprosate (CAMPRAL) 333 MG tablet Take 2 tablets (666 mg total) by mouth 3 (three) times daily. Patient not taking: Reported on 02/14/2020 04/15/19   Malvin Johns, MD  albuterol (VENTOLIN HFA) 108 (90 Base) MCG/ACT inhaler Inhale 1-2 puffs into the lungs every 6 (six) hours as  needed for wheezing or shortness of breath. 02/24/20   Arthor Captain, PA-C  hydrOXYzine (ATARAX/VISTARIL) 25 MG tablet Take 25 mg by mouth every 6 (six) hours as needed for anxiety.    [provider]  ibuprofen (ADVIL) 200 MG tablet Take 200 mg by mouth every 6 (six) hours as needed for headache, mild pain or moderate pain.    [provider]  methylPREDNISolone (MEDROL DOSEPAK) 4 MG TBPK tablet Use as directed 02/24/20   Arthor Captain, PA-C  Multiple Vitamin (MULTIVITAMIN ADULT PO) Take 1 tablet by mouth daily.    [provider]  traZODone (DESYREL) 50 MG tablet Take 1 tablet (50 mg total) by mouth at bedtime as needed for sleep. 02/14/20   Chales Abrahams, NP    Allergies    Patient has no known allergies.  Review of Systems   Review of Systems  Constitutional: Negative.   HENT: Negative.    Respiratory: Negative.    Cardiovascular: Negative.   Genitourinary: Negative.   Musculoskeletal: Negative.   Skin: Negative.   Neurological: Negative.   Psychiatric/Behavioral:  Positive for sleep disturbance. The patient is nervous/anxious.   All other systems reviewed and are negative.  Physical Exam Updated Vital Signs BP 101/67 (BP Location: Left Arm)   Pulse 70   Temp 98.3 F (36.8 C) (Oral)   Resp 16   Ht 6' 2.5" (1.892 m)   Wt 71.2 kg   SpO2 95%   BMI 19.89 kg/m   Physical Exam Vitals and nursing note reviewed.  Constitutional:      General: He is not in acute distress.    Appearance: He is well-developed. He is not ill-appearing, toxic-appearing or diaphoretic.     Comments: Disheveled  HENT:     Head: Atraumatic.     Nose: Nose normal.     Mouth/Throat:     Mouth: Mucous membranes are moist.  Eyes:     Pupils: Pupils are equal, round, and reactive to light.  Cardiovascular:     Rate and Rhythm: Normal rate and regular rhythm.     Pulses: Normal pulses.     Heart sounds: Normal heart sounds.  Pulmonary:     Effort: Pulmonary effort is normal. No respiratory distress.     Breath sounds: Normal breath sounds.  Abdominal:     General: Bowel sounds are normal. There is no distension.     Palpations: Abdomen is soft.  Musculoskeletal:        General: Normal range of motion.     Cervical back: Normal range of motion and neck supple.  Skin:    General: Skin is warm and dry.     Capillary Refill: Capillary refill takes less than 2 seconds.  Neurological:     General: No focal deficit present.     Mental Status: He is alert and oriented to person, place, and time.   Psychiatric:     Comments: Flat affect.  Evasive when asking SI, denies currently SI endorses past SI a few weeks ago. No Plan.  Denies HI.  Denies any current AVH.    ED Results / Procedures / Treatments   Labs (all labs ordered are listed, but only abnormal results are displayed) Labs Reviewed  COMPREHENSIVE METABOLIC PANEL - Abnormal; Notable for the following components:      Result Value   Total Protein 6.2 (*)    All other components within normal limits  SALICYLATE LEVEL - Abnormal; Notable for the following components:  Salicylate Lvl <7.0 (*)    All other components within normal limits  ACETAMINOPHEN LEVEL - Abnormal; Notable for the following components:   Acetaminophen (Tylenol), Serum <10 (*)    All other components within normal limits  ETHANOL - Abnormal; Notable for the following components:   Alcohol, Ethyl (B) 190 (*)    All other components within normal limits  RESP PANEL BY RT-PCR (FLU A&B, COVID) ARPGX2  CBC WITH DIFFERENTIAL/PLATELET  RAPID URINE DRUG SCREEN, HOSP PERFORMED    EKG None  Radiology No results found.  Procedures Procedures   Medications Ordered in ED Medications  LORazepam (ATIVAN) injection 0-4 mg (has no administration in time range)    Or  LORazepam (ATIVAN) tablet 0-4 mg (has no administration in time range)  LORazepam (ATIVAN) injection 0-4 mg (has no administration in time range)    Or  LORazepam (ATIVAN) tablet 0-4 mg (has no administration in time range)  thiamine tablet 100 mg (has no administration in time range)    Or  thiamine (B-1) injection 100 mg (has no administration in time range)    ED Course  I have reviewed the triage vital signs and the nursing notes.  Pertinent labs & imaging results that were available during my care of the patient were reviewed by me and considered in my medical decision making (see chart for details).  Here for evaluation of depression and anxiety.  No current SI. He denies any  current suicidal plan.  No HI.  States he has audio hallucinations when he does drugs however none recently.  Daily drinker.  Does not appear to be actively withdrawing.  Denies prior history of DTs or withdrawal seizures however will place CIWA protocol.  His heart and lungs are clear.  His abdomen is soft, nontender.  He is nonfocal neuro exam without deficits.  Labs personally reviewed and interpreted. Ethanol elevated which is to be expected.  He is ambulatory here without difficulty.  Patient medically cleared.  Ambulatory here without difficulty, Tolerating a [place] sandwich and a drink without difficulty. Disposition per psychiatry. Hold orders placed.  Nursing states patient told them he had to go to his car for something. I was not aware patient had left the ED until after he was gone. Patient was here voluntarily. Does not meet criteria for IVC, denied current SI, HI, AVH.  Patient Eloped    MDM Rules/Calculators/A&P                            Final Clinical Impression(s) / ED Diagnoses Final diagnoses:  Depression, unspecified depression type  Alcohol use    Rx / DC Orders ED Discharge Orders     None        Ellanora Rayborn A, PA-C 05/07/21 2046    Alvira Monday, MD 05/08/21 [phone]

## 2021-05-07 NOTE — ED Triage Notes (Signed)
Patient states he wants "to beat people up." Patient states, "My mental health is messing with me real bad." "I had to get away from people today."  Patient denies any SI.  Patient states he has drank 2 quarts of alcohol today and smoked marijuana yesterday.

## 2021-05-07 NOTE — ED Provider Notes (Signed)
Emergency Medicine Provider Triage Evaluation Note  Tim [name] , a 65 y.o. male  was evaluated in triage.  Pt complains of mental health checkup.  Patient states he is fed up with life.  He drinks alcohol to cope, at least a sixpack daily.  Drink earlier today.  Intermittently uses cocaine.  Vague when answering SI questions.  Denies any plan.  Denies HI.  States occasionally will hear things when he drinks or uses illicit substances.  He denies any current pain.  Feels like he is depressed.    Review of Systems  Positive: Depression Negative: HI  Physical Exam  There were no vitals taken for this visit. Gen:   Awake, no distress   Resp:  Normal effort  MSK:   Moves extremities without difficulty  Other:    Medical Decision Making  Medically screening exam initiated at 5:32 PM.  Appropriate orders placed.  Tim [name] was informed that the remainder of the evaluation will be completed by another provider, this initial triage assessment does not replace that evaluation, and the importance of remaining in the ED until their evaluation is complete.  Psychiatric assessment   Linwood Dibbles, PA-C 05/07/21 1734    Alvira Monday, MD 05/08/21 [phone]
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
## 2021-05-07 NOTE — ED Triage Notes (Signed)
Pt reports hx of mental health issues. Pt states he takes medication for anxiety, but does not take anything for his depression. Pt reports being on alcohol binge. Pt denies SI/HI and A/V hallucinations.

## 2021-05-08 NOTE — H&P (Signed)
Behavioral Health Medical Screening Exam  Tim [name] is a single 65 y.o. male.presenting voluntarily at [facility] for alcoholism. Patient reports that he started drinking at age 64 y/o.  Patient reports that he was raised by both parents with 4 brothers and 1 sister. Patient reports that he has had a good childhood. Patient reports that he drinks a couple of 40 oz beers daily.Patient endorses smoking marijuana several times a week and also smoking crack about 1 week ago. Patient denies experiencing any withdrawal symptoms tremors or seizures. Patient endorsed the following symptoms decreased focus, decreased concentration, fatigue, low motivation, decreased energy, racing thoughts, and nervousness.  Patient reports that his stressors are just day to day living.  Patient has any issues with sleep or decrease in appetite.  Patient denies any past traumas or legal issues.  Patient denies being on any medications currently.  Patient reports he was previously on Vistaril and Prozac and he was being followed by providers at [facility].  Patient reports that he would like to start AA meetings again of back and therapy.  Patient presented earlier today at [facility] long ED with a similar complaint.  Patient denies any SI/HI or AVH.   Total Time spent with patient: 30 minutes  Psychiatric Specialty Exam:  Presentation  General Appearance: Disheveled  Eye Contact:Good  Speech:Clear and Coherent  Speech Volume:Normal  Handedness:Right   Mood and Affect  Mood:Euthymic  Affect:Congruent   Thought Process  Thought Processes:Coherent  Descriptions of Associations:Circumstantial  Orientation:Full (Time, Place and Person)  Thought Content:WDL  History of Schizophrenia/Schizoaffective disorder:No  Duration of Psychotic Symptoms:No data recorded Hallucinations:Hallucinations: None  Ideas of Reference:None  Suicidal Thoughts:Suicidal Thoughts: No  Homicidal Thoughts:Homicidal Thoughts:  No   Sensorium  Memory:Immediate Good; Recent Good; Remote Good  Judgment:Fair  Insight:Fair   Executive Functions  Concentration:Fair  Attention Span:Good  Recall:Fair  Fund of Knowledge:Good  Language:Good   Psychomotor Activity  Psychomotor Activity:Psychomotor Activity: Normal   Assets  Assets:Communication Skills; Desire for Improvement; Housing; Health and safety inspector; Physical Health; Resilience; Social Support   Sleep  Sleep:Sleep: Fair    Physical Exam: Physical Exam HENT:     Head: Normocephalic and atraumatic.     Right Ear: External ear normal.     Left Ear: External ear normal.     Nose: Nose normal.  Eyes:     Pupils: Pupils are equal, round, and reactive to light.  Cardiovascular:     Rate and Rhythm: Normal rate.  Pulmonary:     Effort: Pulmonary effort is normal.  Abdominal:     General: Abdomen is flat.  Musculoskeletal:        General: Normal range of motion.     Cervical back: Normal range of motion.  Neurological:     Mental Status: He is alert and oriented to person, place, and time.  Psychiatric:        Attention and Perception: Attention normal.        Mood and Affect: Mood is anxious. Affect is blunt and flat.        Speech: Speech normal.        Behavior: Behavior is cooperative.        Thought Content: Thought content normal.        Cognition and Memory: Cognition normal.        Judgment: Judgment normal.   Review of Systems  Constitutional: Negative.   HENT: Negative.    Eyes: Negative.   Respiratory: Negative.    Cardiovascular: Negative.  Gastrointestinal: Negative.   Genitourinary: Negative.   Musculoskeletal: Negative.   Skin: Negative.   Neurological: Negative.   Endo/Heme/Allergies: Negative.   Psychiatric/Behavioral:  Positive for substance abuse. The patient is nervous/anxious.   VItal Signs T 97.7 119/80 BP  P 64 O2 RA 99% HT 6'2.5 WT 71.215 kg Musculoskeletal: Strength & Muscle Tone:  within normal limits Gait & Station: normal Patient leans: N/A   Recommendations:  Based on my evaluation the patient does not appear to have an emergency medical condition.Based on my assessment and TTS assessment patient does not meet the criteria for inpatient treatment.  Jasper Riling, NP 05/08/2021, 3:01 AM

## 2021-06-01 DIAGNOSIS — R7303 Prediabetes: Secondary | ICD-10-CM | POA: Diagnosis not present

## 2021-06-01 DIAGNOSIS — Z79899 Other long term (current) drug therapy: Secondary | ICD-10-CM | POA: Diagnosis not present

## 2021-06-01 DIAGNOSIS — I10 Essential (primary) hypertension: Secondary | ICD-10-CM | POA: Diagnosis not present

## 2021-06-01 DIAGNOSIS — Z0001 Encounter for general adult medical examination with abnormal findings: Secondary | ICD-10-CM | POA: Diagnosis not present

## 2021-06-01 IMAGING — DX DG CHEST 1V PORT
1 series · 2 of 2 positions shown · non-contrast
Comparison: 01/17/2020

CLINICAL DATA: Shortness of breath, COPD, hypertension

EXAM:
PORTABLE CHEST 1 VIEW

[Series 1: chest · 0.14mm/px · 2 of 2 slices shown]
[im 1/2]
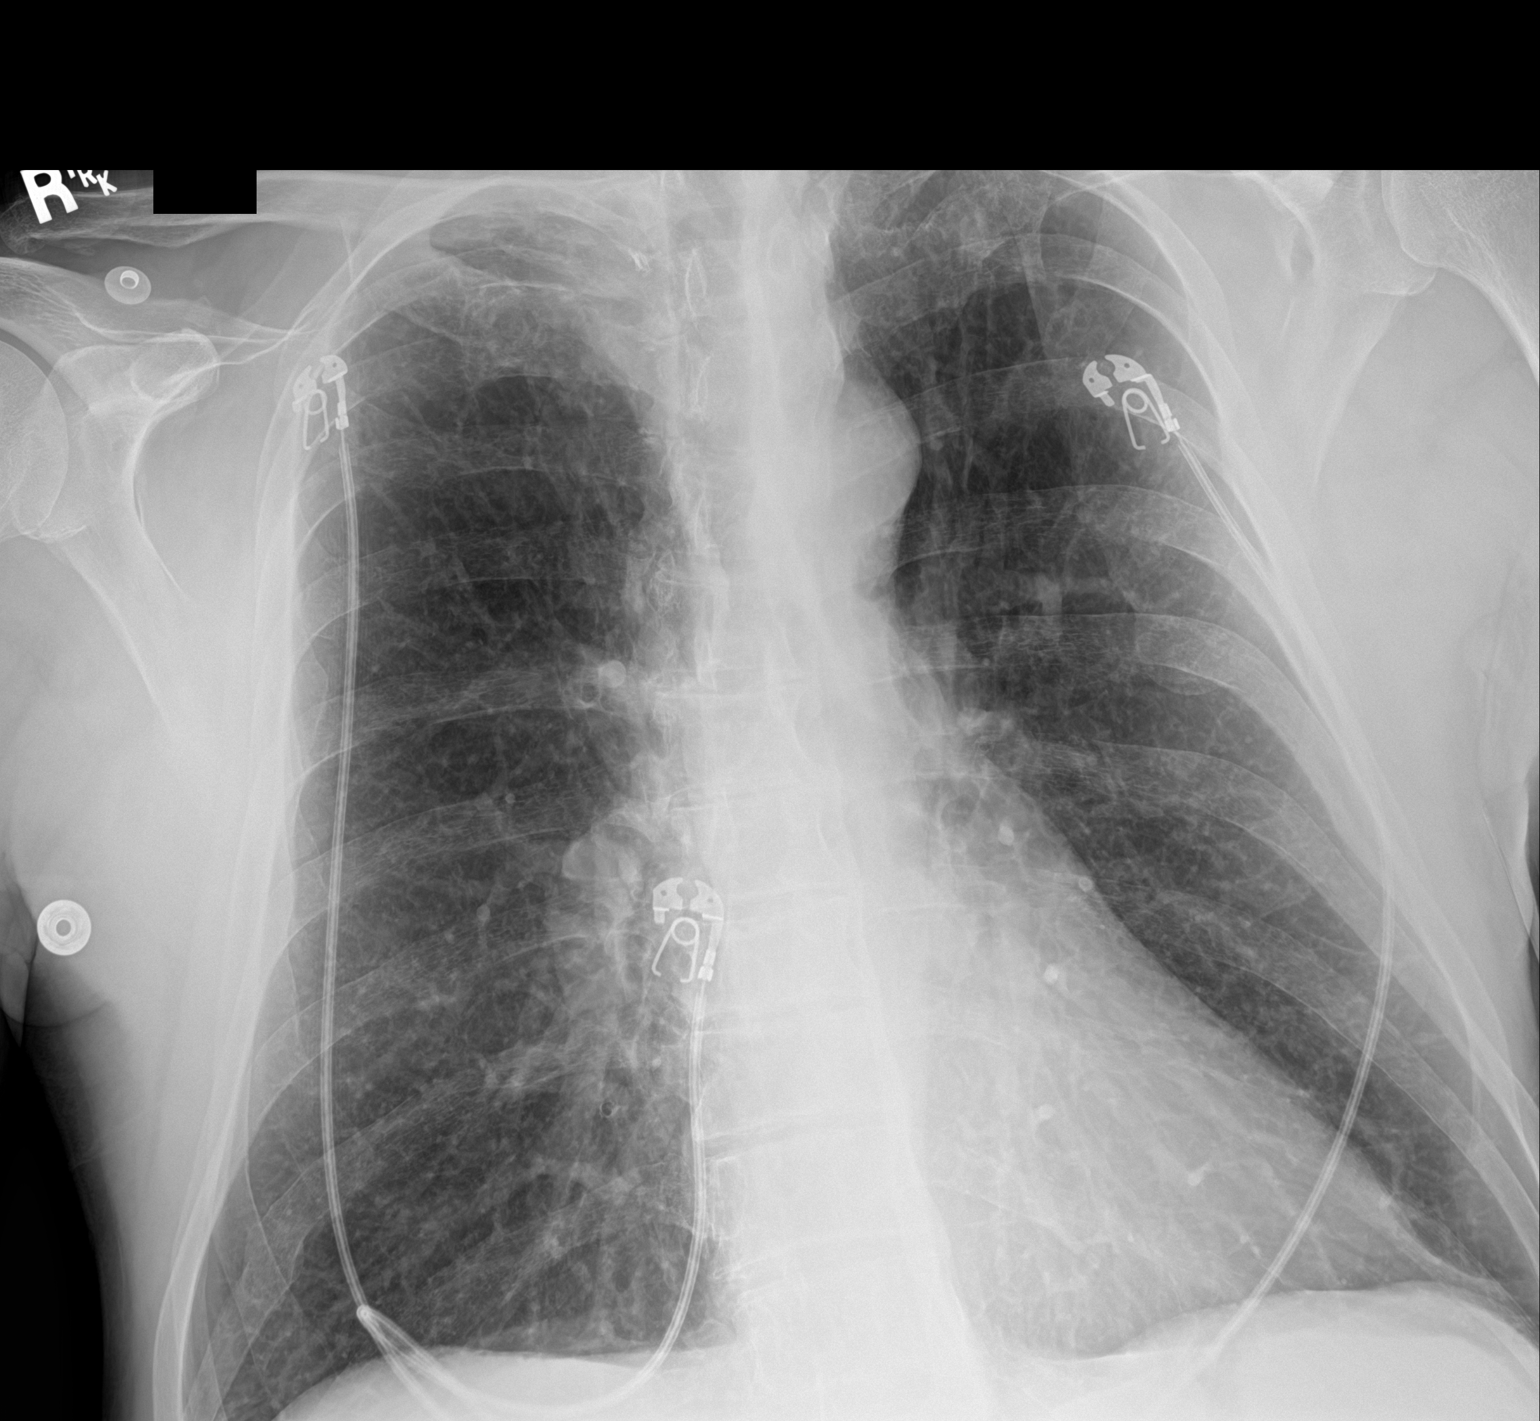
[im 2/2]
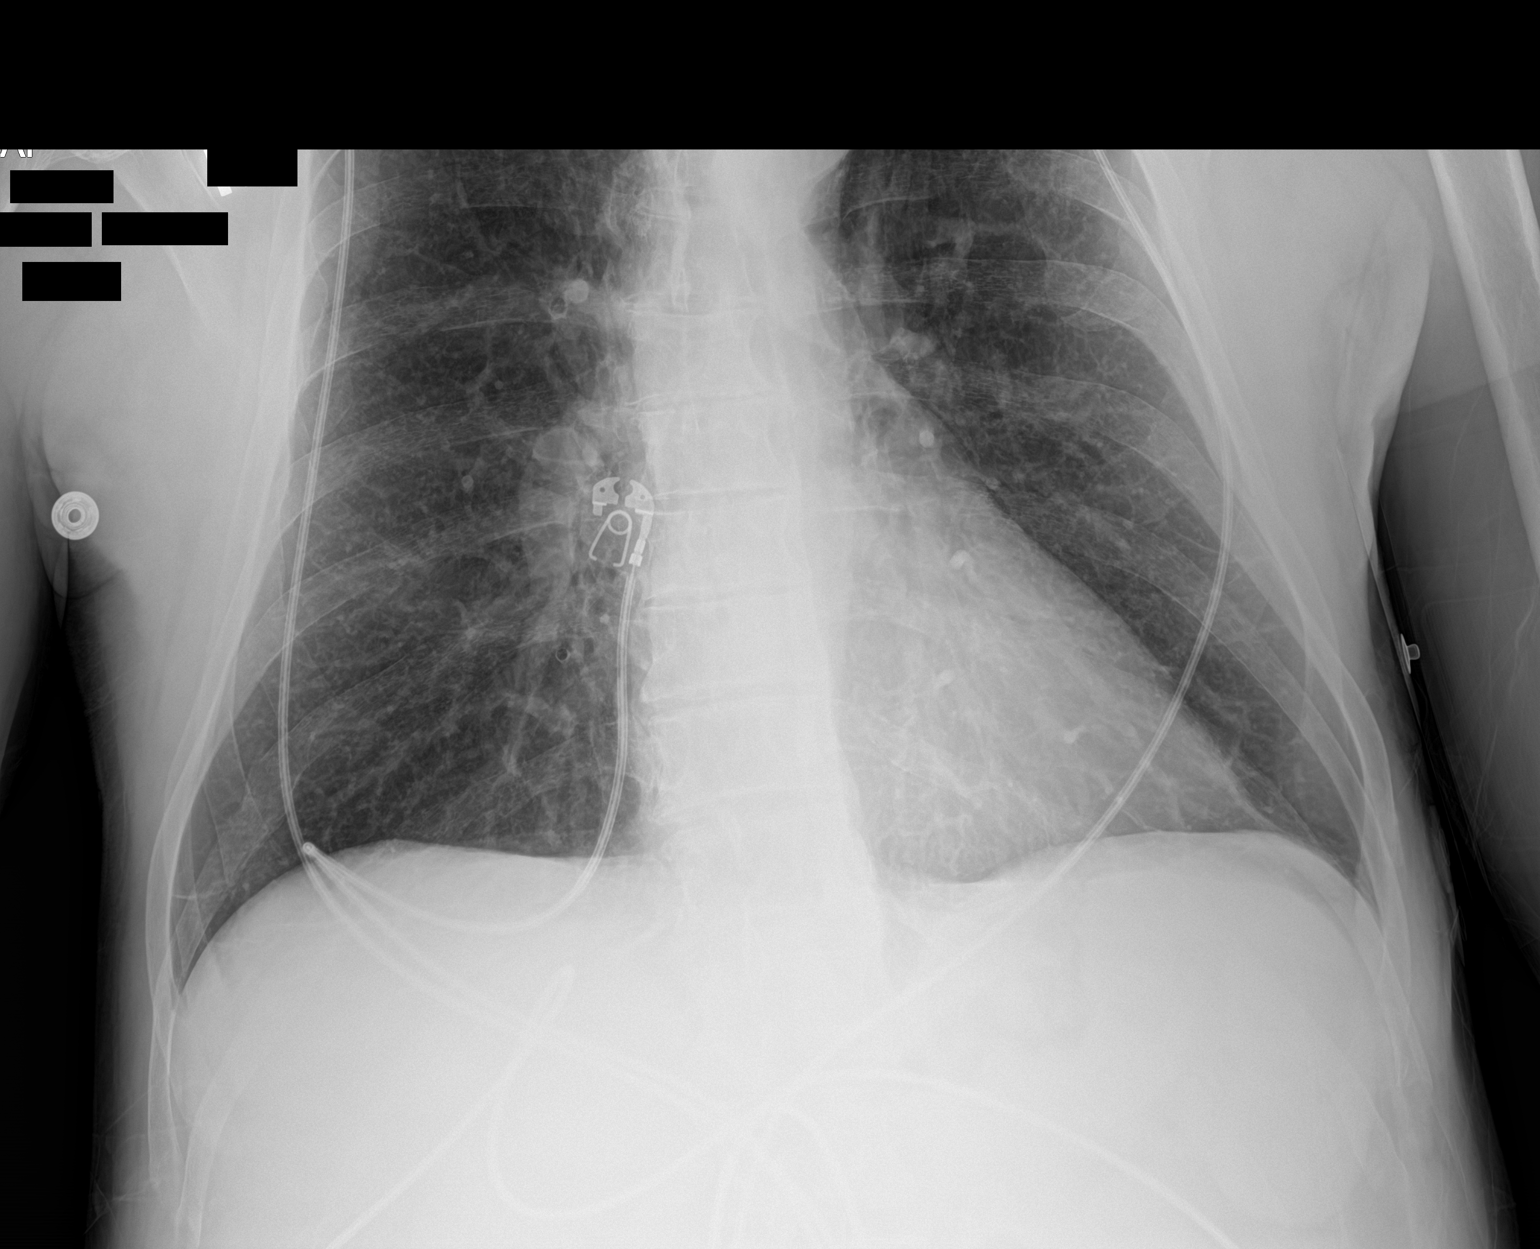

[2 of 2 positions shown; findings below may reference images not displayed]

FINDINGS: Hyperinflation. Scarring in the apices. No confluent opacities or
effusions. Heart is normal size. No acute bony abnormality.
IMPRESSION: COPD/chronic changes.  No active disease.

## 2021-08-12 ENCOUNTER — Encounter (HOSPITAL_COMMUNITY): Payer: Self-pay | Admitting: Emergency Medicine

## 2021-08-12 ENCOUNTER — Other Ambulatory Visit: Payer: Self-pay

## 2021-08-12 ENCOUNTER — Emergency Department (HOSPITAL_COMMUNITY)
Admission: EM | Admit: 2021-08-12 | Discharge: 2021-08-12 | Disposition: A | Discharge status: Home / Self Care | Payer: Medicaid Other | Attending: Emergency Medicine | Admitting: Emergency Medicine

## 2021-08-12 ENCOUNTER — Emergency Department (HOSPITAL_COMMUNITY): Payer: Medicaid Other

## 2021-08-12 DIAGNOSIS — J441 Chronic obstructive pulmonary disease with (acute) exacerbation: Secondary | ICD-10-CM | POA: Diagnosis not present

## 2021-08-12 DIAGNOSIS — R9431 Abnormal electrocardiogram [ECG] [EKG]: Secondary | ICD-10-CM | POA: Diagnosis not present

## 2021-08-12 DIAGNOSIS — Z79899 Other long term (current) drug therapy: Secondary | ICD-10-CM | POA: Diagnosis not present

## 2021-08-12 DIAGNOSIS — F17210 Nicotine dependence, cigarettes, uncomplicated: Secondary | ICD-10-CM | POA: Insufficient documentation

## 2021-08-12 DIAGNOSIS — R0602 Shortness of breath: Secondary | ICD-10-CM | POA: Diagnosis not present

## 2021-08-12 DIAGNOSIS — Z20822 Contact with and (suspected) exposure to covid-19: Secondary | ICD-10-CM | POA: Diagnosis not present

## 2021-08-12 DIAGNOSIS — F10929 Alcohol use, unspecified with intoxication, unspecified: Secondary | ICD-10-CM | POA: Diagnosis not present

## 2021-08-12 LAB — COMPREHENSIVE METABOLIC PANEL
ALT: 16 U/L (ref 0–44)
AST: 22 U/L (ref 15–41)
Albumin: 4.1 g/dL (ref 3.5–5.0)
Alkaline Phosphatase: 69 U/L (ref 38–126)
Anion gap: 8 (ref 5–15)
BUN: 14 mg/dL (ref 8–23)
CO2: 21 mmol/L — ABNORMAL LOW (ref 22–32)
Calcium: 10.6 mg/dL — ABNORMAL HIGH (ref 8.9–10.3)
Chloride: 105 mmol/L (ref 98–111)
Creatinine, Ser: 0.96 mg/dL (ref 0.61–1.24)
GFR, Estimated: >60 mL/min (ref >60)
Glucose, Bld: 76 mg/dL (ref 70–99)
Potassium: 3.7 mmol/L (ref 3.5–5.1)
Sodium: 134 mmol/L — ABNORMAL LOW (ref 135–145)
Total Bilirubin: 0.6 mg/dL (ref 0.3–1.2)
Total Protein: 6.9 g/dL (ref 6.5–8.1)

## 2021-08-12 LAB — CBC WITH DIFFERENTIAL/PLATELET
Abs Immature Granulocytes: 0.01 10*3/uL (ref 0.00–0.07)
Basophils Absolute: 0.1 10*3/uL (ref 0.0–0.1)
Basophils Relative: 1 %
Eosinophils Absolute: 0.2 10*3/uL (ref 0.0–0.5)
Eosinophils Relative: 5 %
HCT: 48.9 % (ref 39.0–52.0)
Hemoglobin: 16.7 g/dL (ref 13.0–17.0)
Immature Granulocytes: 0 %
Lymphocytes Relative: 29 %
Lymphs Abs: 1.2 10*3/uL (ref 0.7–4.0)
MCH: 31.7 pg (ref 26.0–34.0)
MCHC: 34.2 g/dL (ref 30.0–36.0)
MCV: 93 fL (ref 80.0–100.0)
Monocytes Absolute: 0.4 10*3/uL (ref 0.1–1.0)
Monocytes Relative: 9 %
Neutro Abs: 2.3 10*3/uL (ref 1.7–7.7)
Neutrophils Relative %: 56 %
Platelets: 199 10*3/uL (ref 150–400)
RBC: 5.26 MIL/uL (ref 4.22–5.81)
RDW: 13.7 % (ref 11.5–15.5)
WBC: 4.1 10*3/uL (ref 4.0–10.5)
nRBC: 0 % (ref 0.0–0.2)

## 2021-08-12 LAB — RESP PANEL BY RT-PCR (FLU A&B, COVID) ARPGX2
Influenza A by PCR: NEGATIVE
Influenza B by PCR: NEGATIVE
SARS Coronavirus 2 by RT PCR: NEGATIVE

## 2021-08-12 LAB — LACTIC ACID, PLASMA: Lactic Acid, Venous: 1.9 mmol/L (ref 0.5–1.9)

## 2021-08-12 LAB — TROPONIN I (HIGH SENSITIVITY): Troponin I (High Sensitivity): 3 ng/L (ref <18)

## 2021-08-12 LAB — ETHANOL: Alcohol, Ethyl (B): 62 mg/dL — ABNORMAL HIGH (ref <10)

## 2021-08-12 MED ORDER — AEROCHAMBER Z-STAT PLUS/MEDIUM MISC: 1.0000 | Freq: Once | Status: DC

## 2021-08-12 MED ORDER — PREDNISONE 10 MG (21) PO TBPK: ORAL_TABLET | Freq: Every day | ORAL | 0 refills | Status: DC

## 2021-08-12 MED ORDER — ALBUTEROL SULFATE HFA 108 (90 BASE) MCG/ACT IN AERS
2.0000 | INHALATION_SPRAY | RESPIRATORY_TRACT | Status: DC | PRN
  Administered 2021-08-12: 2 via RESPIRATORY_TRACT
  Filled 2021-08-12: qty 6.7

## 2021-08-12 MED ORDER — IPRATROPIUM-ALBUTEROL 0.5-2.5 (3) MG/3ML IN SOLN
3.0000 mL | Freq: Once | RESPIRATORY_TRACT | Status: AC
  Administered 2021-08-12: 3 mL via RESPIRATORY_TRACT
  Filled 2021-08-12: qty 3

## 2021-08-12 MED ORDER — METHYLPREDNISOLONE SODIUM SUCC 125 MG IJ SOLR
125.0000 mg | Freq: Once | INTRAMUSCULAR | Status: AC
  Administered 2021-08-12: 125 mg via INTRAVENOUS
  Filled 2021-08-12: qty 2

## 2021-08-12 NOTE — ED Notes (Signed)
Pt care taken, complaining of shortness of breath, chest pain

## 2021-08-12 NOTE — ED Provider Notes (Signed)
[facility]EM[facility]GENCY [facility]PT Provider Note   CSN: [id number] Arrival date & time: 08/12/21  2042     History Chief Complaint  Patient presents with   Shortness of Breath    Tim [name] is a 65 y.o. male.  Pt presents to the ED today with sob.  Pt said he developed sx around 1830.  He has a right inguinal hernia which is also hurting a little bit.  However, he does not feel like it is stuck like it's been in the past.  No fevers.  He has not been around anyone sick.      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/2018   Cocaine abuse with cocaine-induced mood disorder (HCC) 01/29/2018   Polysubstance dependence (HCC) 01/29/2018   Severe recurrent major depression without psychotic features (HCC) 08/20/2017   Severe episode of recurrent major depressive disorder, without psychotic features (HCC) 07/08/2016   MDD (major depressive disorder), recurrent severe, without psychosis (HCC) 05/23/2016   Hepatitis C 05/23/2016   Alcohol use disorder, severe, dependence (HCC) 11/05/2015   Depression, major, recurrent, moderate (HCC) 11/05/2015   GAD (generalized anxiety disorder) 11/05/2015   Cellulitis of right lower extremity 03/29/2015    Past Surgical History:  Procedure Laterality Date   SKIN CANCER EXCISION     THYROID SURGERY         Family History  Problem Relation Age of Onset   Dementia Mother     Social History   Tobacco Use   Smoking status: Every Day     Packs/day: 0.50    Years: 40.00    Pack years: 20.00    Types: Cigarettes   Smokeless tobacco: Never   Tobacco comments:    Patient refused  Vaping Use   Vaping Use: Never used  Substance Use Topics   Alcohol use: Not Currently   Drug use: Yes    Types: Marijuana    Home Medications Prior to Admission medications   Medication Sig Start Date End Date Taking? Authorizing Provider  predniSONE (STERAPRED UNI-PAK 21 TAB) 10 MG (21) TBPK tablet Take by mouth daily. Take 6 tabs by mouth daily  for 2 days, then 5 tabs for 2 days, then 4 tabs for 2 days, then 3 tabs for 2 days, 2 tabs for 2 days, then 1 tab by mouth daily for 2 days 08/12/21  Yes Jacalyn Lefevre, MD  acamprosate (CAMPRAL) 333 MG tablet Take 2 tablets (666 mg total) by mouth 3 (three) times daily. Patient not taking: Reported on 02/14/2020 04/15/19   Malvin Johns, MD  albuterol (VENTOLIN HFA) 108 (90 Base) MCG/ACT inhaler Inhale 1-2 puffs into the lungs every 6 (six) hours as needed for wheezing or shortness of breath. 02/24/20   Arthor Captain, PA-C  hydrOXYzine (ATARAX/VISTARIL) 25 MG tablet Take 25 mg by mouth every 6 (six) hours as needed for anxiety.    [provider]  ibuprofen (ADVIL) 200 MG  tablet Take 200 mg by mouth every 6 (six) hours as needed for headache, mild pain or moderate pain.    [provider]  methylPREDNISolone (MEDROL DOSEPAK) 4 MG TBPK tablet Use as directed 02/24/20   Arthor Captain, PA-C  Multiple Vitamin (MULTIVITAMIN ADULT PO) Take 1 tablet by mouth daily.    [provider]  traZODone (DESYREL) 50 MG tablet Take 1 tablet (50 mg total) by mouth at bedtime as needed for sleep. 02/14/20   Chales Abrahams, NP    Allergies    Patient has no known allergies.  Review of Systems   Review of Systems  Respiratory:  Positive for shortness of breath.   All other systems reviewed and are negative.  Physical Exam Updated Vital Signs BP 117/73   Pulse 77   Temp 98.3 F (36.8 C)  (Oral)   Resp (!) 24   Ht 6' 2.5" (1.892 m)   Wt 80.7 kg   SpO2 94%   BMI 22.55 kg/m   Physical Exam Vitals and nursing note reviewed.  Constitutional:      Appearance: He is well-developed.  HENT:     Head: Normocephalic and atraumatic.     Mouth/Throat:     Mouth: Mucous membranes are moist.     Pharynx: Oropharynx is clear.  Eyes:     Extraocular Movements: Extraocular movements intact.     Pupils: Pupils are equal, round, and reactive to light.  Cardiovascular:     Rate and Rhythm: Normal rate and regular rhythm.  Pulmonary:     Effort: Pulmonary effort is normal.     Breath sounds: Normal breath sounds.  Abdominal:     General: Bowel sounds are normal.     Palpations: Abdomen is soft.     Hernia: A hernia is present. Hernia is present in the right inguinal area.     Comments: Easily reducible  Musculoskeletal:        General: Normal range of motion.     Cervical back: Normal range of motion and neck supple.  Skin:    General: Skin is warm.     Capillary Refill: Capillary refill takes less than 2 seconds.  Neurological:     General: No focal deficit present.     Mental Status: He is alert and oriented to person, place, and time.  Psychiatric:        Mood and Affect: Mood normal.        Behavior: Behavior normal.    ED Results / Procedures / Treatments   Labs (all labs ordered are listed, but only abnormal results are displayed) Labs Reviewed  COMPREHENSIVE METABOLIC PANEL - Abnormal; Notable for the following components:      Result Value   Sodium 134 (*)    CO2 21 (*)    Calcium 10.6 (*)    All other components within normal limits  ETHANOL - Abnormal; Notable for the following components:   Alcohol, Ethyl (B) 62 (*)    All other components within normal limits  RESP PANEL BY RT-PCR (FLU A&B, COVID) ARPGX2  CULTURE, BLOOD (ROUTINE X 2)  CULTURE, BLOOD (ROUTINE X 2)  CBC WITH DIFFERENTIAL/PLATELET  LACTIC ACID, PLASMA  URINALYSIS, ROUTINE W REFLEX  MICROSCOPIC  LACTIC ACID, PLASMA  RAPID URINE DRUG SCREEN, HOSP PERFORMED  CBG MONITORING, ED  TROPONIN I (HIGH SENSITIVITY)  TROPONIN I (HIGH SENSITIVITY)    EKG EKG Interpretation  Date/Time:  Thursday August 12 2021 20:53:13 EDT Ventricular Rate:  62 PR  Interval:  160 QRS Duration: 110 QT Interval:  391 QTC Calculation: 397 R Axis:   81 Text Interpretation: Sinus rhythm Borderline right axis deviation RSR' in V1 or V2, probably normal variant Minimal ST elevation, inferior leads No significant change since last tracing Confirmed by Jacalyn Lefevre [phone]) on 08/12/2021 11:31:12 PM  Radiology DG Chest 2 View  Result Date: 08/12/2021 CLINICAL DATA:  Shortness of breath. EXAM: CHEST - 2 VIEW COMPARISON:  June 10, 2020 FINDINGS: The lungs are hyperinflated. Mild, diffuse, chronic appearing increased interstitial lung markings are noted. There is no evidence of acute infiltrate, pleural effusion or pneumothorax. The heart size and mediastinal contours are within normal limits. The visualized skeletal structures are unremarkable. IMPRESSION: 1. Findings consistent with COPD. 2. No acute or active cardiopulmonary disease. Electronically Signed   By: Aram Candela M.D.   On: 08/12/2021 21:12    Procedures Procedures   Medications Ordered in ED Medications  albuterol (VENTOLIN HFA) 108 (90 Base) MCG/ACT inhaler 2 puff (has no administration in time range)  aerochamber Z-Stat Plus/medium 1 each (has no administration in time range)  methylPREDNISolone sodium succinate (SOLU-MEDROL) 125 mg/2 mL injection 125 mg (125 mg Intravenous Given 08/12/21 2142)  ipratropium-albuterol (DUONEB) 0.5-2.5 (3) MG/3ML nebulizer solution 3 mL (3 mLs Nebulization Given 08/12/21 2143)    ED Course  I have reviewed the triage vital signs and the nursing notes.  Pertinent labs & imaging results that were available during my care of the patient were reviewed by me and considered in my medical  decision making (see chart for details).    MDM Rules/Calculators/A&P                           Pt is feeling much better.  Pt is saturating well.  He is afebrile.  He does not have pna or covid/flu.  He is stable for d/c.  Return if worse.  F/u with pcp.  Tim [name] was evaluated in Emergency Department on 08/12/2021 for the symptoms described in the history of present illness. He was evaluated in the context of the global COVID-19 pandemic, which necessitated consideration that the patient might be at risk for infection with the SARS-CoV-2 virus that causes COVID-19. Institutional protocols and algorithms that pertain to the evaluation of patients at risk for COVID-19 are in a state of rapid change based on information released by regulatory bodies including the CDC and federal and state organizations. These policies and algorithms were followed during the patient's care in the ED.  Final Clinical Impression(s) / ED Diagnoses Final diagnoses:  COPD exacerbation (HCC)    Rx / DC Orders ED Discharge Orders          Ordered    predniSONE (STERAPRED UNI-PAK 21 TAB) 10 MG (21) TBPK tablet  Daily        08/12/21 2322             Jacalyn Lefevre, MD 08/12/21 2339

## 2021-08-12 NOTE — Discharge Instructions (Addendum)
Try to avoid smoking and drinking alcohol.

## 2021-08-12 NOTE — ED Triage Notes (Signed)
Patient is 92% on room air. Patient is complaining of groin pain from hernia and sob. Patient has a hx of copd. CBG-104. Sinus. No chest pain.

## 2021-08-12 NOTE — ED Notes (Signed)
Labs in main lab

## 2021-08-17 LAB — CULTURE, BLOOD (ROUTINE X 2)
Culture: NO GROWTH
Culture: NO GROWTH
Special Requests: ADEQUATE

## 2021-09-01 ENCOUNTER — Emergency Department (HOSPITAL_COMMUNITY): Payer: Medicaid Other

## 2021-09-01 ENCOUNTER — Emergency Department (HOSPITAL_COMMUNITY)
Admission: EM | Admit: 2021-09-01 | Discharge: 2021-09-02 | Disposition: A | Discharge status: Home / Self Care | Payer: Medicaid Other | Attending: Emergency Medicine | Admitting: Emergency Medicine

## 2021-09-01 ENCOUNTER — Ambulatory Visit (HOSPITAL_COMMUNITY)
Admission: EM | Admit: 2021-09-01 | Discharge: 2021-09-01 | Disposition: A | Discharge status: Home / Self Care | Payer: Medicaid Other | Attending: Behavioral Health | Admitting: Behavioral Health

## 2021-09-01 DIAGNOSIS — J449 Chronic obstructive pulmonary disease, unspecified: Secondary | ICD-10-CM | POA: Insufficient documentation

## 2021-09-01 DIAGNOSIS — Y906 Blood alcohol level of 120-199 mg/100 ml: Secondary | ICD-10-CM | POA: Diagnosis not present

## 2021-09-01 DIAGNOSIS — F17210 Nicotine dependence, cigarettes, uncomplicated: Secondary | ICD-10-CM | POA: Diagnosis not present

## 2021-09-01 DIAGNOSIS — F10129 Alcohol abuse with intoxication, unspecified: Secondary | ICD-10-CM | POA: Insufficient documentation

## 2021-09-01 DIAGNOSIS — F99 Mental disorder, not otherwise specified: Secondary | ICD-10-CM | POA: Diagnosis not present

## 2021-09-01 DIAGNOSIS — F329 Major depressive disorder, single episode, unspecified: Secondary | ICD-10-CM | POA: Insufficient documentation

## 2021-09-01 DIAGNOSIS — Z79899 Other long term (current) drug therapy: Secondary | ICD-10-CM | POA: Insufficient documentation

## 2021-09-01 DIAGNOSIS — Z602 Problems related to living alone: Secondary | ICD-10-CM | POA: Insufficient documentation

## 2021-09-01 DIAGNOSIS — R109 Unspecified abdominal pain: Secondary | ICD-10-CM | POA: Insufficient documentation

## 2021-09-01 DIAGNOSIS — F411 Generalized anxiety disorder: Secondary | ICD-10-CM | POA: Insufficient documentation

## 2021-09-01 DIAGNOSIS — Z7951 Long term (current) use of inhaled steroids: Secondary | ICD-10-CM | POA: Diagnosis not present

## 2021-09-01 DIAGNOSIS — Z733 Stress, not elsewhere classified: Secondary | ICD-10-CM | POA: Diagnosis not present

## 2021-09-01 DIAGNOSIS — F10220 Alcohol dependence with intoxication, uncomplicated: Secondary | ICD-10-CM

## 2021-09-01 DIAGNOSIS — F10229 Alcohol dependence with intoxication, unspecified: Secondary | ICD-10-CM | POA: Diagnosis not present

## 2021-09-01 DIAGNOSIS — I639 Cerebral infarction, unspecified: Secondary | ICD-10-CM | POA: Diagnosis not present

## 2021-09-01 DIAGNOSIS — Z20822 Contact with and (suspected) exposure to covid-19: Secondary | ICD-10-CM | POA: Insufficient documentation

## 2021-09-01 LAB — CBC
HCT: 48.8 % (ref 39.0–52.0)
Hemoglobin: 16.4 g/dL (ref 13.0–17.0)
MCH: 31.5 pg (ref 26.0–34.0)
MCHC: 33.6 g/dL (ref 30.0–36.0)
MCV: 93.7 fL (ref 80.0–100.0)
Platelets: 203 10*3/uL (ref 150–400)
RBC: 5.21 MIL/uL (ref 4.22–5.81)
RDW: 13.4 % (ref 11.5–15.5)
WBC: 6.4 10*3/uL (ref 4.0–10.5)
nRBC: 0 % (ref 0.0–0.2)

## 2021-09-01 LAB — ETHANOL: Alcohol, Ethyl (B): 130 mg/dL — ABNORMAL HIGH (ref <10)

## 2021-09-01 LAB — COMPREHENSIVE METABOLIC PANEL
ALT: 16 U/L (ref 0–44)
AST: 19 U/L (ref 15–41)
Albumin: 3.6 g/dL (ref 3.5–5.0)
Alkaline Phosphatase: 63 U/L (ref 38–126)
Anion gap: 11 (ref 5–15)
BUN: 12 mg/dL (ref 8–23)
CO2: 21 mmol/L — ABNORMAL LOW (ref 22–32)
Calcium: 10.5 mg/dL — ABNORMAL HIGH (ref 8.9–10.3)
Chloride: 101 mmol/L (ref 98–111)
Creatinine, Ser: 0.94 mg/dL (ref 0.61–1.24)
GFR, Estimated: >60 mL/min (ref >60)
Glucose, Bld: 67 mg/dL — ABNORMAL LOW (ref 70–99)
Potassium: 3.8 mmol/L (ref 3.5–5.1)
Sodium: 133 mmol/L — ABNORMAL LOW (ref 135–145)
Total Bilirubin: 0.4 mg/dL (ref 0.3–1.2)
Total Protein: 6.1 g/dL — ABNORMAL LOW (ref 6.5–8.1)

## 2021-09-01 LAB — RAPID URINE DRUG SCREEN, HOSP PERFORMED
Amphetamines: NOT DETECTED
Barbiturates: NOT DETECTED
Benzodiazepines: NOT DETECTED
Cocaine: NOT DETECTED
Opiates: NOT DETECTED
Tetrahydrocannabinol: POSITIVE — AB

## 2021-09-01 NOTE — ED Provider Notes (Signed)
Emergency Medicine Provider Triage Evaluation Note  Tim [name] , a 65 y.o. male  was evaluated in triage.  Pt complains of alcohol use disorder.  He is very difficult to understand.  Jumbled and slurred voice, possibly secondary to EtOH use.  Was sent from [facility] for medical clearance.  Review of Systems  Positive: Alcohol use Negative: HI/SI/AVH  Physical Exam  BP 104/69 (BP Location: Right Arm)   Pulse 68   Temp 98 F (36.7 C) (Oral)   Resp 20   SpO2 96%  Gen:   Awake, no distress   Resp:  Normal effort  MSK:   Moves extremities without difficulty  Other:  Patient intoxicated and very difficult to understand.  Will medically clear.  Medical Decision Making  Medically screening exam initiated at 7:36 PM.  Appropriate orders placed.  RAESHAWN VO was informed that the remainder of the evaluation will be completed by another provider, this initial triage assessment does not replace that evaluation, and the importance of remaining in the ED until their evaluation is complete.     Woodroe Chen 09/01/21 1937    Gerhard Munch, MD 09/01/21 1950

## 2021-09-01 NOTE — ED Triage Notes (Signed)
Pt here for "mental health" help. Recently had to put family "in a home for dementia," states he's been "overwhelmed" since. Pt denies SI/HI in triage. Seen at [facility] earlier today for alcohol abuse, sent to [facility] for medical clearance.

## 2021-09-01 NOTE — Progress Notes (Signed)
Pt was transferred to [facility] for medical clearance.

## 2021-09-01 NOTE — ED Provider Notes (Signed)
Behavioral Health Urgent Care Medical Screening Exam  Patient Name: Tim [name] MRN: [id number] Date of Evaluation: 09/01/21 Chief Complaint:   Diagnosis:  Final diagnoses:  Alcohol abuse with intoxication (HCC)    History of Present illness: RAYEN [name] is a 65 y.o. male.  Patient presents voluntarily to [facility] behavioral health transported by police. [place] shares he called police earlier today because he "needs some help."  He reports he would like assistance with alcohol use disorder. [place] reports chronic, daily alcohol use.  He states "I drink every day that I can, anytime."  He reports last use of alcohol earlier today, he is noted to have a 24, partial remaining,  package of beer in his belongings.  He is unable to recall amount of alcohol use today.  He presents with slurred speech, verbalizes that he is intoxicated.  He states "I am an alcoholic and I need help." Recent stressors include his mother being placed in a skilled nursing facility related to her dementia approximately 1 month ago.  Tim [name] shares that he has never before lived alone and living alone has been a challenging transition. Tim [name] has been diagnosed with major depressive disorder as well as generalized anxiety disorder and polysubstance dependence.  He reports he is not currently linked with outpatient psychiatry and has not taken any medications to address his mood "for months."  He is unable to recall medications at this time. Patient is assessed face-to-face by nurse practitioner.  He is seated in assessment area, no acute distress.  He is alert and oriented, pleasant and cooperative during assessment.   He presents with euthymic mood, congruent affect.  He denies suicidal and homicidal ideations.  He denies any history of suicide attempts, denies history of self-harm.  He contracts verbally for safety with this Clinical research associate.   He has normal behavior.  He denies both auditory and visual hallucinations.   Patient is able to converse coherently with goal-directed thoughts and no distractibility or preoccupation.  He denies paranoia.  Objectively there is no evidence of psychosis/mania or delusional thinking. Patient resides in [facility], denies access to weapons.  He currently receives SSI related to his mental health.  He endorses daily alcohol use, endorses marijuana use most days, last use today.  He denies substance use aside from marijuana and alcohol.  He endorses decreased sleep and average appetite. Andrews endorses abdominal pain, believes this is related to a hernia.  He reports abdominal pain is chronic and is similar to abdominal pain he has experienced in the past.  He attributes this pain to something he has eaten.  Patient offered support and encouragement.   Psychiatric Specialty Exam  Presentation  General Appearance:Disheveled  Eye Contact:Good  Speech:Slurred  Speech Volume:Normal  Handedness:Right   Mood and Affect  Mood:Euthymic  Affect:Congruent   Thought Process  Thought Processes:Coherent; Goal Directed; Linear  Descriptions of Associations:Intact  Orientation:Full (Time, Place and Person)  Thought Content:Logical  Diagnosis of Schizophrenia or Schizoaffective disorder in past: No   Hallucinations:None  Ideas of Reference:None  Suicidal Thoughts:No  Homicidal Thoughts:No   Sensorium  Memory:Immediate Good; Recent Fair; Remote Fair  Judgment:Intact  Insight:Shallow   Executive Functions  Concentration:Good  Attention Span:Good  Recall:Good  Fund of Knowledge:Good  Language:Good   Psychomotor Activity  Psychomotor Activity:Normal   Assets  Assets:Communication Skills; Desire for Improvement; Financial Resources/Insurance; Housing; Resilience   Sleep  Sleep:Fair  Number of hours: No data recorded  Nutritional Assessment (For OBS and FBC admissions only)  Has the patient had a weight loss or gain of 10 pounds or more  in the last 3 months?: No Has the patient had a decrease in food intake/or appetite?: No Does the patient have dental problems?: No Does the patient have eating habits or behaviors that may be indicators of an eating disorder including binging or inducing vomiting?: No Has the patient recently lost weight without trying?: 0 Has the patient been eating poorly because of a decreased appetite?: 0 Malnutrition Screening Tool Score: 0    Physical Exam: Physical Exam Vitals and nursing note reviewed.  Constitutional:      Appearance: Normal appearance. He is well-developed and normal weight.  HENT:     Head: Normocephalic and atraumatic.     Nose: Nose normal.  Pulmonary:     Effort: Pulmonary effort is normal.  Musculoskeletal:        General: Normal range of motion.     Cervical back: Normal range of motion.  Skin:    General: Skin is warm and dry.  Neurological:     Mental Status: He is alert and oriented to person, place, and time.  Psychiatric:        Attention and Perception: Attention and perception normal.        Mood and Affect: Mood and affect normal.        Speech: Speech normal.        Behavior: Behavior normal. Behavior is cooperative.        Thought Content: Thought content normal.        Cognition and Memory: Cognition and memory normal.        Judgment: Judgment normal.   Review of Systems  Constitutional: Negative.   HENT: Negative.    Eyes: Negative.   Respiratory: Negative.    Cardiovascular: Negative.   Gastrointestinal: Negative.   Genitourinary: Negative.   Musculoskeletal: Negative.   Skin: Negative.   Neurological: Negative.   Endo/Heme/Allergies: Negative.   Psychiatric/Behavioral:  Positive for substance abuse.   There were no vitals taken for this visit. There is no height or weight on file to calculate BMI.  Musculoskeletal: Strength & Muscle Tone: within normal limits Gait & Station: normal Patient leans: N/A   [facility] MSE Discharge  Disposition for Follow up and Recommendations: Based on my evaluation the patient appears to have an emergency medical condition for which I recommend the patient be transferred to the emergency department for further evaluation.  Patient transported by police to [facility] emergency department for medical clearance.  Accepting MD Vanita Panda.  Patient agrees with plan to be evaluated in the emergency department for medical clearance. Recommend patient be reevaluated by psychiatry once medically clear.  Patient requested assistance with alcohol use treatment.  Patient remains voluntary at this time.   Lucky Rathke, FNP 09/01/2021, 6:37 PM

## 2021-09-02 ENCOUNTER — Ambulatory Visit (HOSPITAL_COMMUNITY)
Admission: EM | Admit: 2021-09-02 | Discharge: 2021-09-02 | Disposition: A | Discharge status: Other Acute Inpatient Hospital | Payer: Medicaid Other | Source: Home / Self Care

## 2021-09-02 DIAGNOSIS — F1010 Alcohol abuse, uncomplicated: Secondary | ICD-10-CM | POA: Insufficient documentation

## 2021-09-02 LAB — CBG MONITORING, ED
Glucose-Capillary: 99 mg/dL (ref 70–99)
Glucose-Capillary: 112 mg/dL — ABNORMAL HIGH (ref 70–99)

## 2021-09-02 LAB — RESP PANEL BY RT-PCR (FLU A&B, COVID) ARPGX2
Influenza A by PCR: NEGATIVE
Influenza B by PCR: NEGATIVE
SARS Coronavirus 2 by RT PCR: NEGATIVE

## 2021-09-02 MED ORDER — LORAZEPAM 1 MG PO TABS: 0.0000 mg | ORAL_TABLET | Freq: Two times a day (BID) | ORAL | Status: DC

## 2021-09-02 MED ORDER — NICOTINE 14 MG/24HR TD PT24
MEDICATED_PATCH | TRANSDERMAL | Status: AC
  Filled 2021-09-02: qty 1

## 2021-09-02 MED ORDER — LORAZEPAM 1 MG PO TABS
0.0000 mg | ORAL_TABLET | Freq: Four times a day (QID) | ORAL | Status: DC
  Administered 2021-09-02: 05:00:00 1 mg via ORAL
  Filled 2021-09-02: qty 1

## 2021-09-02 MED ORDER — THIAMINE HCL 100 MG PO TABS: 100.0000 mg | ORAL_TABLET | Freq: Every day | ORAL | Status: DC

## 2021-09-02 MED ORDER — NICOTINE 14 MG/24HR TD PT24
14.0000 mg | MEDICATED_PATCH | Freq: Every day | TRANSDERMAL | Status: DC
  Administered 2021-09-02: 11:00:00 14 mg via TRANSDERMAL

## 2021-09-02 NOTE — ED Notes (Signed)
Safe transport called to take pt to [facility] per provider.  Will continue to monitor for safety.

## 2021-09-02 NOTE — ED Notes (Signed)
Safe Transport called 7:50

## 2021-09-02 NOTE — ED Provider Notes (Signed)
[facility] EMERGENCY DEPARTMENT Provider Note   CSN: [id number] Arrival date & time: 09/01/21  1837     History Chief Complaint  Patient presents with   Mental Health Problem         Tim [name] is a 65 y.o. male.  65 y/o male with hx of COPD, alcoholism, anxiety and depression presents to the emergency department for medical clearance.  Was evaluated at [facility] for alcohol abuse and dependence.  Reports that he needs assistance to stop drinking.  Usually has a six-pack per day.  Denies any suicidal or homicidal thoughts. Does not presently feel tremulous. Denies CP, SOB.  The history is provided by the patient. No language interpreter was used.  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) 08/20/2017   Severe episode of recurrent major depressive disorder, without psychotic features ([place]) 07/08/2016   MDD (major depressive disorder), recurrent severe, without psychosis ([place]) 05/23/2016   Hepatitis C 05/23/2016   Alcohol dependence with uncomplicated intoxication ([place]) 11/05/2015   Depression, major, recurrent, moderate ([place]) 11/05/2015   GAD (generalized anxiety disorder) 11/05/2015   Cellulitis of right lower extremity 03/29/2015    Past Surgical History:   Procedure Laterality Date   SKIN CANCER EXCISION     THYROID SURGERY         Family History  Problem Relation Age of Onset   Dementia Mother     Social History   Tobacco Use   Smoking status: Every Day    Packs/day: 0.50    Years: 40.00    Pack years: 20.00    Types: Cigarettes   Smokeless tobacco: Never   Tobacco comments:    Patient refused  Vaping Use   Vaping Use: Never used  Substance Use Topics   Alcohol use: Not Currently   Drug use: Yes    Types: Marijuana    Home Medications Prior to Admission medications   Medication Sig Start Date End Date Taking? Authorizing Provider  acamprosate (CAMPRAL) 333 MG tablet Take 2 tablets (666 mg total) by mouth 3 (three) times daily. Patient not taking: Reported on 02/14/2020 04/15/19   Johnn Hai, MD  albuterol (VENTOLIN HFA) 108 (90 Base) MCG/ACT inhaler Inhale 1-2 puffs into the lungs every 6 (six) hours as needed for wheezing or shortness of breath. 02/24/20   Margarita Mail, PA-C  hydrOXYzine (ATARAX/VISTARIL) 25 MG tablet Take 25 mg by mouth every 6 (six) hours as needed for anxiety.    [provider]  ibuprofen (ADVIL) 200 MG tablet Take 200 mg by mouth every 6 (six) hours as needed for headache, mild pain or moderate pain.    [provider]  methylPREDNISolone (MEDROL DOSEPAK) 4 MG TBPK tablet Use as directed 02/24/20  Arthor Captain, PA-C  Multiple Vitamin (MULTIVITAMIN ADULT PO) Take 1 tablet by mouth daily.    [provider]  predniSONE (STERAPRED UNI-PAK 21 TAB) 10 MG (21) TBPK tablet Take by mouth daily. Take 6 tabs by mouth daily  for 2 days, then 5 tabs for 2 days, then 4 tabs for 2 days, then 3 tabs for 2 days, 2 tabs for 2 days, then 1 tab by mouth daily for 2 days 08/12/21   Jacalyn Lefevre, MD  traZODone (DESYREL) 50 MG tablet Take 1 tablet (50 mg total) by mouth at bedtime as needed for sleep. 02/14/20   Chales Abrahams, NP    Allergies    Patient has no known  allergies.  Review of Systems   Review of Systems Ten systems reviewed and are negative for acute change, except as noted in the HPI.    Physical Exam Updated Vital Signs BP 99/64   Pulse (!) 58   Temp 98 F (36.7 C) (Oral)   Resp 16   SpO2 94%   Physical Exam Vitals and nursing note reviewed.  Constitutional:      General: He is not in acute distress.    Appearance: He is well-developed. He is not diaphoretic.     Comments: Nontoxic appearing and in NAD  HENT:     Head: Normocephalic and atraumatic.  Eyes:     General: No scleral icterus.    Conjunctiva/sclera: Conjunctivae normal.  Cardiovascular:     Rate and Rhythm: Normal rate and regular rhythm.     Pulses: Normal pulses.  Pulmonary:     Effort: Pulmonary effort is normal. No respiratory distress.     Comments: Respirations even and unlabored Musculoskeletal:        General: Normal range of motion.     Cervical back: Normal range of motion.  Skin:    General: Skin is warm and dry.     Coloration: Skin is not pale.     Findings: No erythema or rash.  Neurological:     Mental Status: He is alert and oriented to person, place, and time.     Coordination: Coordination normal.  Psychiatric:        Attention and Perception: Attention normal.        Behavior: Behavior is cooperative.        Thought Content: Thought content does not include homicidal or suicidal ideation.     Comments: Speech appears much more clear in comparison with [facility] provider note.    ED Results / Procedures / Treatments   Labs (all labs ordered are listed, but only abnormal results are displayed) Labs Reviewed  COMPREHENSIVE METABOLIC PANEL - Abnormal; Notable for the following components:      Result Value   Sodium 133 (*)    CO2 21 (*)    Glucose, Bld 67 (*)    Calcium 10.5 (*)    Total Protein 6.1 (*)    All other components within normal limits  ETHANOL - Abnormal; Notable for the following components:   Alcohol, Ethyl (B) 130  (*)    All other components within normal limits  RAPID URINE DRUG SCREEN, HOSP PERFORMED - Abnormal; Notable for the following components:   Tetrahydrocannabinol POSITIVE (*)    All other components within normal limits  RESP PANEL BY RT-PCR (FLU A&B, COVID) ARPGX2  CBC  CBG MONITORING, ED    EKG None  Radiology CT Head Wo Contrast  Result Date: 09/01/2021 CLINICAL DATA:  Neuro  deficit, acute, stroke suspected EXAM: CT HEAD WITHOUT CONTRAST TECHNIQUE: Contiguous axial images were obtained from the base of the skull through the vertex without intravenous contrast. COMPARISON:  05/08/2010 FINDINGS: Brain: Focal encephalomalacia within the anterior pole of the right frontal lobe in keeping with remote cortical infarct is unchanged. Tiny peripheral infarct within the left cerebellar hemisphere, new since prior examination but remote in nature. No acute intracranial hemorrhage or infarct. No abnormal mass effect or midline shift. No abnormal intra or extra-axial mass lesion. Ventricular size is normal. Cerebellum is unremarkable. Vascular: No asymmetric hyperdense vasculature at the skull base. Skull: Normal. Negative for fracture or focal lesion. Sinuses/Orbits: Orbits are unremarkable. Paranasal sinuses are clear. Other: Mastoid air cells and middle ear cavities are clear. IMPRESSION: No acute intracranial abnormality. 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 Electronically Signed   By: Fidela Salisbury M.D.   On: 09/01/2021 22:29    Procedures Procedures   Medications Ordered in ED Medications  LORazepam (ATIVAN) tablet 0-4 mg (1 mg Oral Given 09/02/21 0432)  LORazepam (ATIVAN) tablet 0-4 mg (has no administration in time range)  thiamine tablet 100 mg (has no administration in time range)    ED Course  I have reviewed the triage vital signs and the nursing notes.  Pertinent labs & imaging results that were available during my care of the patient  were reviewed by me and considered in my medical decision making (see chart for details).  Clinical Course as of 09/02/21 0550  Thu Sep 02, 2021  0235 Medically cleared and pending TTS evaluation [KH]    Clinical Course User Index [KH] Beverely Pace   MDM Rules/Calculators/A&P                           65 year old male presents to the emergency department from [facility] for medical clearance.  His labs have been reviewed and are reassuring.  Ethanol elevated at 130, though the patient has now been in the department for 11 hours and has likely metabolized.  Noted to be clinically sober on assessment.  Medically cleared and appropriate for transfer back to [facility] for ongoing evaluation.   Final Clinical Impression(s) / ED Diagnoses Final diagnoses:  Alcohol dependence with intoxication with complication [facility])    Rx / DC Orders ED Discharge Orders     None        Antonietta Breach, PA-C 09/02/21 0553    Maudie Flakes, MD 09/02/21 [phone]

## 2021-09-02 NOTE — Discharge Instructions (Signed)
Please begin to call and apply for the following residential substance use treatment programs:   [facility]  [address] [place], [place] [address] [phone]  [facility]  [address], [place], [place] [address] [phone]  [facility] ([facility])  [address], [place], [place] [address] [phone]  [facility] ([facility]) [address] [place], [place] [address] [phone], [place], [place] [address] [phone]

## 2021-09-02 NOTE — ED Notes (Signed)
Pt discharged with  AVS.  AVS reviewed prior to discharge.  Pt alert, oriented, and ambulatory.  Safety maintained.  °

## 2021-09-02 NOTE — ED Provider Notes (Signed)
Patient presents behavioral Health Urgent Care Medical Screening Exam  Patient Name: Tim [name] MRN: [id number] Date of Evaluation: 09/02/21 Chief Complaint:   Diagnosis:  Final diagnoses:  Alcohol abuse    History of Present illness: Tim [name] is a 65 y.o. male.  Patient presents voluntarily, transported from [facility] emergency department after evaluation for medical clearance.  Tim [name] reports he would like assistance with alcohol use disorder.  His last alcohol was on yesterday.  He chronically drinks alcohol, "as much as I can."  He reports he has been enrolled in substance use treatment at [facility] approximately 2 years ago.  He has attempted to attend AA meetings but struggles with transportation.  Recent stressors include living alone, for the first time in his life, for approximately 1 month.  His mother has moved from the family home to a memory care unit related to dementia.  Patient has been diagnosed with substance-induced mood disorder, major depressive disorder and polysubstance dependence.  He is typically followed outpatient by [facility] however he reports he has been unable to secure transportation and will need to reenroll with [facility] moving forward.  He reports his medication "was not really helping so he states he "stopped taking the medication years ago."  The only medication he can recall at this time is Vistaril.   Patient is assessed face-to-face by nurse practitioner. He is seated in assessment area, no acute distress. He is alert and oriented, pleasant and cooperative during assessment.   He presents with euthymic mood, congruent affect. He denies suicidal and homicidal ideations.  He denies any history of suicide attempts, denies history of self-harm.  He contracts verbally for safety with this Probation officer.  He has slurred speech and normal behavior.  He denies both auditory and visual hallucinations.  Patient is able to converse coherently with goal-directed  thoughts and no distractibility or preoccupation.  He denies paranoia.  Objectively there is no evidence of psychosis/mania or delusional thinking.   Patient resides alone in [place].  He denies access to weapons.  He receives disability benefits.  He endorses average sleep and appetite.  He endorses alcohol and marijuana use, denies substance use aside from alcohol and marijuana.  Patient offered support and encouragement.  He gives verbal consent to speak with his brother, Tim [name] phone number [phone].  Spoke with patient's brother who agrees with plan for patient to follow-up with substance use treatment options.  Patient's brother confirms no weapons in home, patient's brother agrees patient is safe when home alone.     Psychiatric Specialty Exam  Presentation  General Appearance:Appropriate for Environment; Casual  Eye Contact:Good  Speech:Clear and Coherent; Normal Rate  Speech Volume:Normal  Handedness:Right   Mood and Affect  Mood:Euthymic  Affect:Appropriate; Congruent   Thought Process  Thought Processes:Coherent; Goal Directed; Linear  Descriptions of Associations:Intact  Orientation:Full (Time, Place and Person)  Thought Content:Logical; WDL  Diagnosis of Schizophrenia or Schizoaffective disorder in past: No   Hallucinations:None  Ideas of Reference:None  Suicidal Thoughts:No  Homicidal Thoughts:No   Sensorium  Memory:Immediate Good; Recent Fair; Remote Fair  Judgment:Good  Insight:Fair   Executive Functions  Concentration:Good  Attention Span:Good  [place] of Knowledge:Good  Language:Good   Psychomotor Activity  Psychomotor Activity:Normal   Assets  Assets:Communication Skills; Desire for Improvement; Financial Resources/Insurance; Housing; Physical Health; Resilience; Social Support   Sleep  Sleep:Fair  Number of hours: No data recorded  Nutritional Assessment (For OBS and [facility] admissions only) Has  the patient  had a weight loss or gain of 10 pounds or more in the last 3 months?: No Has the patient had a decrease in food intake/or appetite?: No Does the patient have dental problems?: No Does the patient have eating habits or behaviors that may be indicators of an eating disorder including binging or inducing vomiting?: No Has the patient recently lost weight without trying?: 0 Has the patient been eating poorly because of a decreased appetite?: 0 Malnutrition Screening Tool Score: 0   Physical Exam: Physical Exam Vitals and nursing note reviewed.  Constitutional:      Appearance: Normal appearance. He is well-developed.  HENT:     Head: Normocephalic and atraumatic.     Nose: Nose normal.  Cardiovascular:     Rate and Rhythm: Normal rate.  Pulmonary:     Effort: Pulmonary effort is normal.  Musculoskeletal:        General: Normal range of motion.     Cervical back: Normal range of motion.  Skin:    General: Skin is warm and dry.  Neurological:     Mental Status: He is alert and oriented to person, place, and time.  Psychiatric:        Attention and Perception: Attention and perception normal.        Mood and Affect: Mood and affect normal.        Speech: Speech normal.        Behavior: Behavior normal. Behavior is cooperative.        Thought Content: Thought content normal.        Cognition and Memory: Cognition and memory normal.        Judgment: Judgment normal.   Review of Systems  Constitutional: Negative.   HENT: Negative.    Eyes: Negative.   Respiratory: Negative.    Cardiovascular: Negative.   Gastrointestinal: Negative.   Genitourinary: Negative.   Musculoskeletal: Negative.   Skin: Negative.   Neurological: Negative.   Endo/Heme/Allergies: Negative.   Psychiatric/Behavioral:  Positive for substance abuse.   Blood pressure 113/64, pulse 66, temperature 98.2 F (36.8 C), temperature source Oral, resp. rate 16, SpO2 92 %. There is no height or weight on  file to calculate BMI.  Musculoskeletal: Strength & Muscle Tone: within normal limits Gait & Station: normal Patient leans: N/A   [facility] MSE Discharge Disposition for Follow up and Recommendations: Based on my evaluation the patient does not appear to have an emergency medical condition and can be discharged with resources and follow up care in outpatient services for Medication Management and Individual Therapy Patient reviewed with Dr. Bronwen Betters. Follow-up with outpatient psychiatry at [facility]. Patient has been accepted to [facility] for alcohol use treatment.   Lenard Lance, FNP 09/02/2021, 11:55 AM

## 2021-09-03 ENCOUNTER — Telehealth (HOSPITAL_COMMUNITY): Payer: Self-pay | Admitting: Internal Medicine

## 2021-09-03 NOTE — BH Assessment (Signed)
Care Management - Follow Up [facility] Discharges   Patient has been placed in an inpatient psychiatric hospital [facility] in [place] on 09-02-21.

## 2021-10-09 ENCOUNTER — Encounter (HOSPITAL_COMMUNITY)
Admission: EM | Disposition: A | Discharge status: Home / Self Care | Payer: Self-pay | Source: Home / Self Care | Attending: Emergency Medicine

## 2021-10-09 ENCOUNTER — Emergency Department (HOSPITAL_COMMUNITY): Payer: Medicaid Other | Admitting: Certified Registered Nurse Anesthetist

## 2021-10-09 ENCOUNTER — Encounter (HOSPITAL_COMMUNITY): Payer: Self-pay

## 2021-10-09 ENCOUNTER — Other Ambulatory Visit: Payer: Self-pay

## 2021-10-09 ENCOUNTER — Emergency Department (HOSPITAL_COMMUNITY): Payer: Medicaid Other

## 2021-10-09 ENCOUNTER — Observation Stay (HOSPITAL_COMMUNITY)
Admission: EM | Admit: 2021-10-09 | Discharge: 2021-10-10 | Disposition: A | Discharge status: Home / Self Care | Payer: Medicaid Other | Attending: Surgery | Admitting: Surgery

## 2021-10-09 DIAGNOSIS — E079 Disorder of thyroid, unspecified: Secondary | ICD-10-CM | POA: Diagnosis not present

## 2021-10-09 DIAGNOSIS — J449 Chronic obstructive pulmonary disease, unspecified: Secondary | ICD-10-CM | POA: Insufficient documentation

## 2021-10-09 DIAGNOSIS — K4030 Unilateral inguinal hernia, with obstruction, without gangrene, not specified as recurrent: Secondary | ICD-10-CM | POA: Diagnosis not present

## 2021-10-09 DIAGNOSIS — R112 Nausea with vomiting, unspecified: Secondary | ICD-10-CM

## 2021-10-09 DIAGNOSIS — Z20822 Contact with and (suspected) exposure to covid-19: Secondary | ICD-10-CM | POA: Insufficient documentation

## 2021-10-09 DIAGNOSIS — F1010 Alcohol abuse, uncomplicated: Secondary | ICD-10-CM | POA: Insufficient documentation

## 2021-10-09 DIAGNOSIS — K4090 Unilateral inguinal hernia, without obstruction or gangrene, not specified as recurrent: Secondary | ICD-10-CM | POA: Diagnosis not present

## 2021-10-09 DIAGNOSIS — R1031 Right lower quadrant pain: Secondary | ICD-10-CM

## 2021-10-09 DIAGNOSIS — Z85828 Personal history of other malignant neoplasm of skin: Secondary | ICD-10-CM | POA: Insufficient documentation

## 2021-10-09 DIAGNOSIS — F17210 Nicotine dependence, cigarettes, uncomplicated: Secondary | ICD-10-CM | POA: Insufficient documentation

## 2021-10-09 DIAGNOSIS — D1739 Benign lipomatous neoplasm of skin and subcutaneous tissue of other sites: Secondary | ICD-10-CM | POA: Diagnosis not present

## 2021-10-09 DIAGNOSIS — K4040 Unilateral inguinal hernia, with gangrene, not specified as recurrent: Secondary | ICD-10-CM | POA: Diagnosis present

## 2021-10-09 DIAGNOSIS — R1111 Vomiting without nausea: Secondary | ICD-10-CM | POA: Diagnosis not present

## 2021-10-09 DIAGNOSIS — R109 Unspecified abdominal pain: Secondary | ICD-10-CM

## 2021-10-09 HISTORY — PX: INGUINAL HERNIA REPAIR: SHX194

## 2021-10-09 LAB — COMPREHENSIVE METABOLIC PANEL
ALT: 17 U/L (ref 0–44)
AST: 23 U/L (ref 15–41)
Albumin: 4.3 g/dL (ref 3.5–5.0)
Alkaline Phosphatase: 71 U/L (ref 38–126)
Anion gap: 11 (ref 5–15)
BUN: 13 mg/dL (ref 8–23)
CO2: 22 mmol/L (ref 22–32)
Calcium: 10.6 mg/dL — ABNORMAL HIGH (ref 8.9–10.3)
Chloride: 101 mmol/L (ref 98–111)
Creatinine, Ser: 0.83 mg/dL (ref 0.61–1.24)
GFR, Estimated: >60 mL/min (ref >60)
Glucose, Bld: 110 mg/dL — ABNORMAL HIGH (ref 70–99)
Potassium: 3.8 mmol/L (ref 3.5–5.1)
Sodium: 134 mmol/L — ABNORMAL LOW (ref 135–145)
Total Bilirubin: 1 mg/dL (ref 0.3–1.2)
Total Protein: 7.2 g/dL (ref 6.5–8.1)

## 2021-10-09 LAB — CBC WITH DIFFERENTIAL/PLATELET
Abs Immature Granulocytes: 0.05 10*3/uL (ref 0.00–0.07)
Basophils Absolute: 0.1 10*3/uL (ref 0.0–0.1)
Basophils Relative: 1 %
Eosinophils Absolute: 0.1 10*3/uL (ref 0.0–0.5)
Eosinophils Relative: 1 %
HCT: 51 % (ref 39.0–52.0)
Hemoglobin: 17.5 g/dL — ABNORMAL HIGH (ref 13.0–17.0)
Immature Granulocytes: 1 %
Lymphocytes Relative: 8 %
Lymphs Abs: 0.9 10*3/uL (ref 0.7–4.0)
MCH: 31.5 pg (ref 26.0–34.0)
MCHC: 34.3 g/dL (ref 30.0–36.0)
MCV: 91.7 fL (ref 80.0–100.0)
Monocytes Absolute: 0.5 10*3/uL (ref 0.1–1.0)
Monocytes Relative: 4 %
Neutro Abs: 8.9 10*3/uL — ABNORMAL HIGH (ref 1.7–7.7)
Neutrophils Relative %: 85 %
Platelets: 210 10*3/uL (ref 150–400)
RBC: 5.56 MIL/uL (ref 4.22–5.81)
RDW: 14.2 % (ref 11.5–15.5)
WBC: 10.4 10*3/uL (ref 4.0–10.5)
nRBC: 0 % (ref 0.0–0.2)

## 2021-10-09 LAB — RESP PANEL BY RT-PCR (FLU A&B, COVID) ARPGX2
Influenza A by PCR: NEGATIVE
Influenza B by PCR: NEGATIVE
SARS Coronavirus 2 by RT PCR: NEGATIVE

## 2021-10-09 LAB — LIPASE, BLOOD: Lipase: 30 U/L (ref 11–51)

## 2021-10-09 LAB — LACTIC ACID, PLASMA: Lactic Acid, Venous: 1.3 mmol/L (ref 0.5–1.9)

## 2021-10-09 SURGERY — REPAIR, HERNIA, INGUINAL, INCARCERATED
Anesthesia: General | Laterality: Right

## 2021-10-09 MED ORDER — HYDROMORPHONE HCL 1 MG/ML IJ SOLN
1.0000 mg | Freq: Once | INTRAMUSCULAR | Status: AC
  Administered 2021-10-09: 22:00:00 1 mg via INTRAVENOUS
  Filled 2021-10-09: qty 1

## 2021-10-09 MED ORDER — IOHEXOL 350 MG/ML SOLN
80.0000 mL | Freq: Once | INTRAVENOUS | Status: AC | PRN
  Administered 2021-10-09: 20:00:00 80 mL via INTRAVENOUS

## 2021-10-09 MED ORDER — FENTANYL CITRATE (PF) 100 MCG/2ML IJ SOLN
INTRAMUSCULAR | Status: DC | PRN
  Administered 2021-10-09: 100 ug via INTRAVENOUS

## 2021-10-09 MED ORDER — FENTANYL CITRATE (PF) 100 MCG/2ML IJ SOLN
INTRAMUSCULAR | Status: AC
  Filled 2021-10-09: qty 2

## 2021-10-09 MED ORDER — KETAMINE HCL 50 MG/5ML IJ SOSY
PREFILLED_SYRINGE | INTRAMUSCULAR | Status: AC
  Filled 2021-10-09: qty 5

## 2021-10-09 MED ORDER — HYDROCODONE-ACETAMINOPHEN 5-325 MG PO TABS
1.0000 | ORAL_TABLET | Freq: Once | ORAL | Status: AC
  Administered 2021-10-09: 18:00:00 1 via ORAL
  Filled 2021-10-09: qty 1

## 2021-10-09 MED ORDER — ROCURONIUM BROMIDE 10 MG/ML (PF) SYRINGE
PREFILLED_SYRINGE | INTRAVENOUS | Status: DC | PRN
  Administered 2021-10-09: 50 mg via INTRAVENOUS
  Administered 2021-10-10: 20 mg via INTRAVENOUS

## 2021-10-09 MED ORDER — PROPOFOL 10 MG/ML IV BOLUS
INTRAVENOUS | Status: DC | PRN
  Administered 2021-10-09: 200 mg via INTRAVENOUS

## 2021-10-09 MED ORDER — KETAMINE HCL 10 MG/ML IJ SOLN
INTRAMUSCULAR | Status: DC | PRN
  Administered 2021-10-09: 20 mg via INTRAVENOUS
  Administered 2021-10-09: 30 mg via INTRAVENOUS

## 2021-10-09 MED ORDER — LIDOCAINE 2% (20 MG/ML) 5 ML SYRINGE
INTRAMUSCULAR | Status: DC | PRN
  Administered 2021-10-09: 100 mg via INTRAVENOUS

## 2021-10-09 MED ORDER — MIDAZOLAM HCL 5 MG/5ML IJ SOLN
INTRAMUSCULAR | Status: DC | PRN
  Administered 2021-10-09: 2 mg via INTRAVENOUS

## 2021-10-09 MED ORDER — CEFAZOLIN SODIUM-DEXTROSE 2-4 GM/100ML-% IV SOLN
INTRAVENOUS | Status: AC
  Filled 2021-10-09: qty 100

## 2021-10-09 MED ORDER — MIDAZOLAM HCL 2 MG/2ML IJ SOLN
INTRAMUSCULAR | Status: AC
  Filled 2021-10-09: qty 2

## 2021-10-09 MED ORDER — SUCCINYLCHOLINE CHLORIDE 200 MG/10ML IV SOSY
PREFILLED_SYRINGE | INTRAVENOUS | Status: DC | PRN
  Administered 2021-10-09: 120 mg via INTRAVENOUS

## 2021-10-09 MED ORDER — ONDANSETRON HCL 4 MG/2ML IJ SOLN
INTRAMUSCULAR | Status: AC
  Filled 2021-10-09: qty 2

## 2021-10-09 MED ORDER — PROPOFOL 10 MG/ML IV BOLUS
INTRAVENOUS | Status: AC
  Filled 2021-10-09: qty 20

## 2021-10-09 MED ORDER — DEXAMETHASONE SODIUM PHOSPHATE 10 MG/ML IJ SOLN
INTRAMUSCULAR | Status: DC | PRN
  Administered 2021-10-09: 10 mg via INTRAVENOUS

## 2021-10-09 MED ORDER — LACTATED RINGERS IV SOLN: INTRAVENOUS | Status: DC | PRN

## 2021-10-09 MED ORDER — ROCURONIUM BROMIDE 10 MG/ML (PF) SYRINGE
PREFILLED_SYRINGE | INTRAVENOUS | Status: AC
  Filled 2021-10-09: qty 10

## 2021-10-09 MED ORDER — CEFAZOLIN SODIUM-DEXTROSE 2-3 GM-%(50ML) IV SOLR
INTRAVENOUS | Status: DC | PRN
  Administered 2021-10-09: 2 g via INTRAVENOUS

## 2021-10-09 MED ORDER — ONDANSETRON HCL 4 MG/2ML IJ SOLN
4.0000 mg | Freq: Once | INTRAMUSCULAR | Status: AC
  Administered 2021-10-09: 22:00:00 4 mg via INTRAVENOUS
  Filled 2021-10-09: qty 2

## 2021-10-09 MED ORDER — ONDANSETRON 4 MG PO TBDP
4.0000 mg | ORAL_TABLET | Freq: Once | ORAL | Status: AC
  Administered 2021-10-09: 18:00:00 4 mg via ORAL
  Filled 2021-10-09: qty 1

## 2021-10-09 MED ORDER — BUPIVACAINE-EPINEPHRINE 0.25% -1:200000 IJ SOLN
INTRAMUSCULAR | Status: AC
  Filled 2021-10-09: qty 1

## 2021-10-09 MED ORDER — DEXAMETHASONE SODIUM PHOSPHATE 10 MG/ML IJ SOLN
INTRAMUSCULAR | Status: AC
  Filled 2021-10-09: qty 1

## 2021-10-09 SURGICAL SUPPLY — 50 items
ADH SKN CLS APL DERMABOND .7 (GAUZE/BANDAGES/DRESSINGS) ×1
APL SKNCLS STERI-STRIP NONHPOA (GAUZE/BANDAGES/DRESSINGS)
BAG COUNTER SPONGE SURGICOUNT (BAG) IMPLANT
BAG SPNG CNTER NS LX DISP (BAG)
BAG SURGICOUNT SPONGE COUNTING (BAG)
BENZOIN TINCTURE PRP APPL 2/3 (GAUZE/BANDAGES/DRESSINGS) IMPLANT
BLADE HEX COATED 2.75 (ELECTRODE) ×4 IMPLANT
BLADE SURG 15 STRL LF DISP TIS (BLADE) ×2 IMPLANT
BLADE SURG 15 STRL SS (BLADE) ×3
CLOSURE WOUND 1/2 X4 (GAUZE/BANDAGES/DRESSINGS)
DECANTER SPIKE VIAL GLASS SM (MISCELLANEOUS) ×4 IMPLANT
DERMABOND ADVANCED (GAUZE/BANDAGES/DRESSINGS) ×2
DERMABOND ADVANCED .7 DNX12 (GAUZE/BANDAGES/DRESSINGS) IMPLANT
DRAIN PENROSE 0.5X18 (DRAIN) ×4 IMPLANT
DRAPE LAPAROTOMY TRNSV 102X78 (DRAPES) ×4 IMPLANT
ELECT REM PT RETURN 15FT ADLT (MISCELLANEOUS) ×4 IMPLANT
GAUZE SPONGE 4X4 12PLY STRL (GAUZE/BANDAGES/DRESSINGS) IMPLANT
GLOVE SURG MICRO LTX SZ8 (GLOVE) ×4 IMPLANT
GLOVE SURG UNDER LTX SZ8 (GLOVE) ×8 IMPLANT
GLOVE SURG UNDER POLY LF SZ7 (GLOVE) ×4 IMPLANT
GOWN STRL REUS W/TWL LRG LVL3 (GOWN DISPOSABLE) ×4 IMPLANT
GOWN STRL REUS W/TWL XL LVL3 (GOWN DISPOSABLE) ×8 IMPLANT
KIT BASIN OR (CUSTOM PROCEDURE TRAY) ×4 IMPLANT
KIT TURNOVER KIT A (KITS) IMPLANT
MARKER SKIN DUAL TIP RULER LAB (MISCELLANEOUS) ×4 IMPLANT
MESH HERNIA SYS ULTRAPRO LRG (Mesh General) ×2 IMPLANT
NDL HYPO 25X1 1.5 SAFETY (NEEDLE) ×2 IMPLANT
NEEDLE HYPO 25X1 1.5 SAFETY (NEEDLE) ×3 IMPLANT
NS IRRIG 1000ML POUR BTL (IV SOLUTION) ×4 IMPLANT
PACK BASIC VI WITH GOWN DISP (CUSTOM PROCEDURE TRAY) ×4 IMPLANT
PENCIL SMOKE EVACUATOR (MISCELLANEOUS) IMPLANT
SPONGE T-LAP 18X18 ~~LOC~~+RFID (SPONGE) ×4 IMPLANT
STRIP CLOSURE SKIN 1/2X4 (GAUZE/BANDAGES/DRESSINGS) IMPLANT
SUT MNCRL AB 4-0 PS2 18 (SUTURE) ×4 IMPLANT
SUT NOVA 0 T19/GS 22DT (SUTURE) IMPLANT
SUT NOVA NAB GS-21 0 18 T12 DT (SUTURE) ×8 IMPLANT
SUT SILK 2 0 SH (SUTURE) IMPLANT
SUT SILK 3 0 (SUTURE)
SUT SILK 3-0 18XBRD TIE 12 (SUTURE) IMPLANT
SUT VIC AB 0 CT1 36 (SUTURE) ×4 IMPLANT
SUT VIC AB 2-0 SH 27 (SUTURE) ×3
SUT VIC AB 2-0 SH 27X BRD (SUTURE) ×2 IMPLANT
SUT VIC AB 3-0 SH 18 (SUTURE) ×4 IMPLANT
SUT VIC AB 3-0 SH 27 (SUTURE) ×3
SUT VIC AB 3-0 SH 27XBRD (SUTURE) ×2 IMPLANT
SUT VICRYL 3 0 BR 18  UND (SUTURE) ×3
SUT VICRYL 3 0 BR 18 UND (SUTURE) ×2 IMPLANT
SYR BULB IRRIG 60ML STRL (SYRINGE) ×4 IMPLANT
SYR CONTROL 10ML LL (SYRINGE) ×4 IMPLANT
TOWEL OR 17X26 10 PK STRL BLUE (TOWEL DISPOSABLE) ×4 IMPLANT

## 2021-10-09 NOTE — ED Triage Notes (Signed)
Per EMS- Patient is from home. Patient reports a known right inguinal hernia and when he stood up today he began having increased pain and swelling to the right groin. Patient also had N/V x 2. Patient had Zofran 4 mg  and Fentanyl 100 mcg IV prior to arrival to the ED.

## 2021-10-09 NOTE — Anesthesia Preprocedure Evaluation (Addendum)
Anesthesia Evaluation  Patient identified by MRN, date of birth, ID band Patient awake    Reviewed: Allergy & Precautions, NPO status , Patient's Chart, lab work & pertinent test results  History of Anesthesia Complications Negative for: history of anesthetic complications  Airway Mallampati: I  TM Distance: >3 FB Neck ROM: Full    Dental  (+) Dental Advisory Given, Poor Dentition, Missing   Pulmonary COPD, Current Smoker,    Pulmonary exam normal        Cardiovascular negative cardio ROS Normal cardiovascular exam     Neuro/Psych Anxiety Depression negative neurological ROS     GI/Hepatic hiatal hernia, (+)     substance abuse  alcohol use, cocaine use and marijuana use, Hepatitis -, CIncarcerated inguinal hernia   Endo/Other  negative endocrine ROS  Renal/GU negative Renal ROS  negative genitourinary   Musculoskeletal negative musculoskeletal ROS (+)   Abdominal   Peds  Hematology negative hematology ROS (+)   Anesthesia Other Findings   Reproductive/Obstetrics                             Anesthesia Physical Anesthesia Plan  ASA: 2 and emergent  Anesthesia Plan: General   Post-op Pain Management: Toradol IV (intra-op)   Induction: Intravenous and Rapid sequence  PONV Risk Score and Plan: 1 and Ondansetron, Dexamethasone, Treatment may vary due to age or medical condition and Midazolam  Airway Management Planned: Oral ETT  Additional Equipment: None  Intra-op Plan:   Post-operative Plan: Extubation in OR  Informed Consent: I have reviewed the patients History and Physical, chart, labs and discussed the procedure including the risks, benefits and alternatives for the proposed anesthesia with the patient or authorized representative who has indicated his/her understanding and acceptance.     Dental advisory given  Plan Discussed with:   Anesthesia Plan Comments:          Anesthesia Quick Evaluation

## 2021-10-09 NOTE — Anesthesia Procedure Notes (Signed)
Procedure Name: Intubation Date/Time: 10/09/2021 11:30 PM Performed by: Gerald Leitz, CRNA Pre-anesthesia Checklist: Patient identified, Patient being monitored, Timeout performed, Emergency Drugs available and Suction available Patient Re-evaluated:Patient Re-evaluated prior to induction Oxygen Delivery Method: Circle system utilized Preoxygenation: Pre-oxygenation with 100% oxygen Induction Type: IV induction and Rapid sequence Ventilation: Mask ventilation without difficulty Laryngoscope Size: Mac and 3 Grade View: Grade I Tube type: Oral Tube size: 7.5 mm Number of attempts: 1 Airway Equipment and Method: Stylet Placement Confirmation: ETT inserted through vocal cords under direct vision, positive ETCO2 and breath sounds checked- equal and bilateral Secured at: 24 cm Tube secured with: Tape Dental Injury: Teeth and Oropharynx as per pre-operative assessment

## 2021-10-09 NOTE — ED Provider Notes (Signed)
Tim [name]   CSN: [id number] Arrival date & time: 10/09/21  1745     History Chief Complaint  Patient presents with   Groin Pain   Groin Swelling    Tim [name] is a 65 y.o. male.  The history is provided by the patient and medical records. No language interpreter was used.  Groin Pain This is a new problem. The current episode started 3 to 5 hours ago. The problem occurs constantly. The problem has not changed since onset.Associated symptoms include abdominal pain. Pertinent negatives include no chest pain, no headaches and no shortness of breath. The symptoms are aggravated by bending and twisting. Nothing relieves the symptoms. He has tried nothing for the symptoms. The treatment provided no relief.      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[place]) 11/10/2018   Cocaine abuse with cocaine-induced mood disorder ([place]) 01/29/2018   Polysubstance dependence ([place]) 01/29/2018   Severe recurrent major depression without psychotic features ([place]) 08/20/2017   Severe episode of recurrent major depressive disorder, without psychotic features ([place]) 07/08/2016   MDD (major depressive disorder), recurrent severe, without psychosis ([place]) 05/23/2016   Hepatitis C 05/23/2016   Alcohol dependence with uncomplicated intoxication ([place]) 11/05/2015   Depression, major, recurrent, moderate ([place]) 11/05/2015   GAD (generalized anxiety disorder) 11/05/2015   Cellulitis of  right lower extremity 03/29/2015    Past Surgical History:  Procedure Laterality Date   SKIN CANCER EXCISION     THYROID SURGERY         Family History  Problem Relation Age of Onset   Dementia Mother     Social History   Tobacco Use   Smoking status: Every Day    Packs/day: 0.50    Years: 40.00    Pack years: 20.00    Types: Cigarettes   Smokeless tobacco: Never   Tobacco comments:    Patient refused  Vaping Use   Vaping Use: Never used  Substance Use Topics   Alcohol use: Yes   Drug use: Yes    Types: Marijuana    Home Medications Prior to Admission medications   Medication Sig Start Date End Date Taking? Authorizing Provider  albuterol (VENTOLIN HFA) 108 (90 Base) MCG/ACT inhaler Inhale 1-2 puffs into the lungs every 6 (six) hours as needed for wheezing or shortness of breath. 02/24/20   Harris, Abigail, PA-C  predniSONE (STERAPRED UNI-PAK 21 TAB) 10 MG (21) TBPK tablet Take by mouth daily. Take 6 tabs by mouth daily  for 2 days, then 5 tabs for 2 days, then 4 tabs for 2 days, then 3 tabs for 2 days, 2 tabs for 2 days, then 1 tab by mouth daily for 2 days 08/12/21   Isla Pence, MD    Allergies    Patient has no known allergies.  Review of Systems   Review of Systems  Constitutional:  Negative for chills, fatigue and fever.  HENT:  Negative for congestion.   Respiratory:  Negative for cough, chest tightness, shortness of breath and wheezing.   Cardiovascular:  Negative for chest pain, palpitations and leg swelling.  Gastrointestinal:  Positive for abdominal pain, nausea and vomiting. Negative for constipation and diarrhea.  Genitourinary:  Positive for scrotal swelling. Negative for dysuria and flank pain.  Musculoskeletal:  Negative for back pain and neck pain.  Skin:  Negative for rash and wound.  Neurological:  Negative for headaches.  Psychiatric/Behavioral:  Negative for agitation.   All other systems reviewed and are negative.  Physical  Exam Updated Vital Signs BP 128/79    Pulse 76    Temp 97.9 F (36.6 C) (Oral)    Resp 20    Ht 6' 2.5" (1.892 m)    Wt 72.6 kg    SpO2 100%    BMI 20.27 kg/m   Physical Exam Vitals and nursing [name] reviewed. Exam conducted with a chaperone present.  Constitutional:      General: He is not in acute distress.    Appearance: He is well-developed.  HENT:     Head: Normocephalic and atraumatic.  Eyes:     Conjunctiva/sclera: Conjunctivae normal.  Cardiovascular:     Rate and Rhythm: Normal rate and regular rhythm.     Heart sounds: No murmur heard. Pulmonary:     Effort: Pulmonary effort is normal. No respiratory distress.     Breath sounds: Normal breath sounds.  Abdominal:     Palpations: Abdomen is soft.     Tenderness: There is no abdominal tenderness.  Genitourinary:    Testes:        Right: Tenderness present.        Left: Tenderness not present.    Musculoskeletal:        General: No swelling.     Cervical back: Neck supple.  Skin:    General: Skin is warm and dry.     Capillary Refill: Capillary refill takes less than 2 seconds.  Neurological:     Mental Status: He is alert.  Psychiatric:        Mood and Affect: Mood normal.    ED Results / Procedures / Treatments   Labs (all labs ordered are listed, but only abnormal results are displayed) Labs Reviewed  CBC WITH DIFFERENTIAL/PLATELET - Abnormal; Notable for the following components:      Result Value   Hemoglobin 17.5 (*)    Neutro Abs 8.9 (*)    All other components within normal limits  COMPREHENSIVE METABOLIC PANEL - Abnormal; Notable for the following components:   Sodium 134 (*)    Glucose, Bld 110 (*)    Calcium 10.6 (*)    All other components within normal limits  RESP PANEL BY RT-PCR (FLU A&B, COVID) ARPGX2  LIPASE, BLOOD  URINALYSIS, ROUTINE W REFLEX MICROSCOPIC  LACTIC ACID, PLASMA  LACTIC ACID, PLASMA    EKG None  Radiology CT ABDOMEN PELVIS W CONTRAST  Result Date:  10/09/2021 CLINICAL DATA:  Right inguinal hernia with groin pain and swelling, complicated. EXAM: CT ABDOMEN AND PELVIS WITH CONTRAST TECHNIQUE: Multidetector CT imaging of the abdomen and pelvis was performed using the standard protocol following bolus administration of intravenous contrast. CONTRAST:  32mL OMNIPAQUE IOHEXOL 350 MG/ML SOLN COMPARISON:  09/01/2020. FINDINGS: Lower chest: Mild atelectasis or scarring at the lung bases. The heart is normal in size. Hepatobiliary: A subcentimeter hypodensity is present in the right lobe of the liver measuring 6 mm which is  too small to further characterize. No biliary ductal dilatation. The gallbladder is without stones. Pancreas: Unremarkable. No pancreatic ductal dilatation or surrounding inflammatory changes. Spleen: Normal in size without focal abnormality. Adrenals/Urinary Tract: No adrenal nodule or mass. No renal or ureteral calculus or obstructive uropathy bilaterally. Scattered hypodensities are present in the kidneys bilaterally which are too small to further characterize. The bladder is within normal limits. Stomach/Bowel: There is a right inguinal hernia containing multiple loops of nondistended bowel with surrounding fat stranding and ascites extending into the scrotal sac on the right. A small hiatal hernia is noted. No bowel obstruction, free air or pneumatosis. The appendix is normal in caliber. Vascular/Lymphatic: A nonspecific prominent lymph node is present in the periaortic space on the left measuring up to 1 cm in short axis diameter. Aortic atherosclerosis without evidence of aneurysm. Reproductive: The prostate gland is mildly enlarged. Other: Small fat containing umbilical hernia. Musculoskeletal: Degenerative changes in the thoracolumbar spine. No acute osseous abnormality. IMPRESSION: 1. Right inguinal hernia extending into the scrotum on the right containing multiple loops of nonobstructed bowel, concerning for incarcerated hernia. There is  fat stranding and ascites in the hernia sac suggesting local inflammatory changes. Surgical consultation is recommended. 2. Aortic atherosclerosis. 3. Enlarged prostate gland. Electronically Signed   By: Brett Fairy M.D.   On: 10/09/2021 20:09    Procedures Procedures   CRITICAL CARE Performed by: Gwenyth Allegra Davin Muramoto Total critical care time: 30 minutes Critical care time was exclusive of separately billable procedures and treating other patients. Critical care was necessary to treat or prevent imminent or life-threatening deterioration. Critical care was time spent personally by me on the following activities: development of treatment plan with patient and/or surrogate as well as nursing, discussions with consultants, evaluation of patient's response to treatment, examination of patient, obtaining history from patient or surrogate, ordering and performing treatments and interventions, ordering and review of laboratory studies, ordering and review of radiographic studies, pulse oximetry and re-evaluation of patient's condition.   Medications Ordered in ED Medications  ondansetron (ZOFRAN-ODT) disintegrating tablet 4 mg (4 mg Oral Given 10/09/21 1821)  HYDROcodone-acetaminophen (NORCO/VICODIN) 5-325 MG per tablet 1 tablet (1 tablet Oral Given 10/09/21 1820)  iohexol (OMNIPAQUE) 350 MG/ML injection 80 mL (80 mLs Intravenous Contrast Given 10/09/21 1935)  ondansetron (ZOFRAN) injection 4 mg (4 mg Intravenous Given 10/09/21 2130)  HYDROmorphone (DILAUDID) injection 1 mg (1 mg Intravenous Given 10/09/21 2130)    ED Course  I have reviewed the triage vital signs and the nursing notes.  Pertinent labs & imaging results that were available during my care of the patient were reviewed by me and considered in my medical decision making (see chart for details).    MDM Rules/Calculators/A&P                          Tim [name] is a 65 y.o. male with a past medical history significant for  hepatitis C, anxiety, depression, previous polysubstance abuse, and report of previous right inguinal hernia who presents with groin pain.  According to patient, he has had a small hernia there for many years but has never had trouble with it.  He has never had it repaired but today had a coughing fit around 4 PM and had onset of increased swelling and pain.  He reports since that onset of discomfort he has not passed any gas or had a bowel movement.  He is also had nausea and vomiting  since.  Describes the pain as 9 out of 10 and severe.  He does not report any urinary changes and denies any other preceding symptoms of fevers, chills congestion, cough, chest pain, shortness of breath.  He denies any other trauma.  On exam with a chaperone, patient does indeed have a inguinal and right hemiscrotal enlargement with redness and tenderness.  Left side nontender.  Abdomen is also tender.  Bowel sounds were diminished on auscultation.  Otherwise lungs clear and chest nontender.  Patient had work-up started in triage including some labs and a CT scan to look for hernia.  CT scan returned showing evidence of suspected incarcerated multiple loops of bowel and an inguinal hernia in the right scrotum.  I went to assess the patient and when I try to do some palpation for gentle reduction, patient had significant pain.  We will give Dilaudid, Zofran, and he will be made NPO.  We will get a lactic acid and COVID swab.   9:31 PM Just spoke to Dr. Luisa Hart with general surgery who will come see the patient now to discuss management.  Anticipate admission for further management.  Gen surg will take to OR for management.    Final Clinical Impression(s) / ED Diagnoses Final diagnoses:  Unilateral inguinal hernia without obstruction or gangrene, recurrence not specified  Right inguinal pain  Nausea and vomiting, unspecified vomiting type  Abdominal pain, unspecified abdominal location    Clinical Impression: 1.  Unilateral inguinal hernia without obstruction or gangrene, recurrence not specified   2. Right inguinal pain   3. Nausea and vomiting, unspecified vomiting type   4. Abdominal pain, unspecified abdominal location     Disposition: Admit  This [name] was prepared with assistance of Dragon voice recognition software. Occasional wrong-word or sound-a-like substitutions may have occurred due to the inherent limitations of voice recognition software.     Sinead Hockman, Canary Brim, MD 10/09/21 [phone]

## 2021-10-09 NOTE — H&P (Addendum)
Tim [name] is an 65 y.o. male.   Chief Complaint: Incarcerated right inguinal hernia HPI: Patient presents to ED tonight after coughing spell resulting in severe right groin pain earlier today.  He has had a known hernia for years there.  The hernia popped out would not pop back in.  He was seen by ED PA who tried to reduce it but could not.  He also had nausea and vomiting he states.  CT scan shows a loop of small bowel incarcerated in the right inguinal hernia.  History of alcohol abuse.  Also history of marijuana use.  Past Medical History:  Diagnosis Date   Alcoholism (HCC)    Anxiety    COPD (chronic obstructive pulmonary disease) (HCC)    Depression    Hep C w/o coma, chronic (HCC)    History of hiatal hernia    hernia rt groin   Mental health disorder    Scoliosis    Thyroid disease     Past Surgical History:  Procedure Laterality Date   SKIN CANCER EXCISION     THYROID SURGERY      Family History  Problem Relation Age of Onset   Dementia Mother    Social History:  reports that he has been smoking cigarettes. He has a 20.00 pack-year smoking history. He has never used smokeless tobacco. He reports current alcohol use. He reports current drug use. Drug: Marijuana.  Allergies: No Known Allergies  (Not in a hospital admission)   Results for orders placed or performed during the hospital encounter of 10/09/21 (from the past 48 hour(s))  CBC with Differential     Status: Abnormal   Collection Time: 10/09/21  6:21 PM  Result Value Ref Range   WBC 10.4 4.0 - 10.5 K/uL   RBC 5.56 4.22 - 5.81 MIL/uL   Hemoglobin 17.5 (H) 13.0 - 17.0 g/dL   HCT 16.6 06.3 - 01.6 %   MCV 91.7 80.0 - 100.0 fL   MCH 31.5 26.0 - 34.0 pg   MCHC 34.3 30.0 - 36.0 g/dL   RDW 01.0 93.2 - 35.5 %   Platelets 210 150 - 400 K/uL   nRBC 0.0 0.0 - 0.2 %   Neutrophils Relative % 85 %   Neutro Abs 8.9 (H) 1.7 - 7.7 K/uL   Lymphocytes Relative 8 %   Lymphs Abs 0.9 0.7 - 4.0 K/uL   Monocytes  Relative 4 %   Monocytes Absolute 0.5 0.1 - 1.0 K/uL   Eosinophils Relative 1 %   Eosinophils Absolute 0.1 0.0 - 0.5 K/uL   Basophils Relative 1 %   Basophils Absolute 0.1 0.0 - 0.1 K/uL   Immature Granulocytes 1 %   Abs Immature Granulocytes 0.05 0.00 - 0.07 K/uL    Comment: Performed at [facility], [address], [place], [place] [address]  Comprehensive metabolic panel     Status: Abnormal   Collection Time: 10/09/21  6:21 PM  Result Value Ref Range   Sodium 134 (L) 135 - 145 mmol/L   Potassium 3.8 3.5 - 5.1 mmol/L   Chloride 101 98 - 111 mmol/L   CO2 22 22 - 32 mmol/L   Glucose, Bld 110 (H) 70 - 99 mg/dL    Comment: Glucose reference range applies only to samples taken after fasting for at least 8 hours.   BUN 13 8 - 23 mg/dL   Creatinine, Ser 2.54 0.61 - 1.24 mg/dL   Calcium 27.0 (H) 8.9 - 10.3 mg/dL  Total Protein 7.2 6.5 - 8.1 g/dL   Albumin 4.3 3.5 - 5.0 g/dL   AST 23 15 - 41 U/L   ALT 17 0 - 44 U/L   Alkaline Phosphatase 71 38 - 126 U/L   Total Bilirubin 1.0 0.3 - 1.2 mg/dL   GFR, Estimated >35 >00 mL/min    Comment: (NOTE) Calculated using the CKD-EPI Creatinine Equation (2021)    Anion gap 11 5 - 15    Comment: Performed at [facility], [address] [place], [place] [address]  Lipase, blood     Status: None   Collection Time: 10/09/21  6:21 PM  Result Value Ref Range   Lipase 30 11 - 51 U/L    Comment: Performed at [facility], [address], [place], [place] [address]   CT ABDOMEN PELVIS W CONTRAST  Result Date: 10/09/2021 CLINICAL DATA:  Right inguinal hernia with groin pain and swelling, complicated. EXAM: CT ABDOMEN AND PELVIS WITH CONTRAST TECHNIQUE: Multidetector CT imaging of the abdomen and pelvis was performed using the standard protocol following bolus administration of intravenous contrast. CONTRAST:  56mL OMNIPAQUE IOHEXOL 350 MG/ML SOLN COMPARISON:  09/01/2020. FINDINGS: Lower chest:  Mild atelectasis or scarring at the lung bases. The heart is normal in size. Hepatobiliary: A subcentimeter hypodensity is present in the right lobe of the liver measuring 6 mm which is too small to further characterize. No biliary ductal dilatation. The gallbladder is without stones. Pancreas: Unremarkable. No pancreatic ductal dilatation or surrounding inflammatory changes. Spleen: Normal in size without focal abnormality. Adrenals/Urinary Tract: No adrenal nodule or mass. No renal or ureteral calculus or obstructive uropathy bilaterally. Scattered hypodensities are present in the kidneys bilaterally which are too small to further characterize. The bladder is within normal limits. Stomach/Bowel: There is a right inguinal hernia containing multiple loops of nondistended bowel with surrounding fat stranding and ascites extending into the scrotal sac on the right. A small hiatal hernia is noted. No bowel obstruction, free air or pneumatosis. The appendix is normal in caliber. Vascular/Lymphatic: A nonspecific prominent lymph node is present in the periaortic space on the left measuring up to 1 cm in short axis diameter. Aortic atherosclerosis without evidence of aneurysm. Reproductive: The prostate gland is mildly enlarged. Other: Small fat containing umbilical hernia. Musculoskeletal: Degenerative changes in the thoracolumbar spine. No acute osseous abnormality. IMPRESSION: 1. Right inguinal hernia extending into the scrotum on the right containing multiple loops of nonobstructed bowel, concerning for incarcerated hernia. There is fat stranding and ascites in the hernia sac suggesting local inflammatory changes. Surgical consultation is recommended. 2. Aortic atherosclerosis. 3. Enlarged prostate gland. Electronically Signed   By: Thornell Sartorius M.D.   On: 10/09/2021 20:09    Review of Systems  All other systems reviewed and are negative.  Blood pressure 105/61, pulse 65, temperature 98.1 F (36.7 C),  temperature source Oral, resp. rate 14, height 6' 2.5" (1.892 m), weight 72.6 kg, SpO2 91 %. Physical Exam Constitutional:      Appearance: Normal appearance.  HENT:     Head: Normocephalic.  Eyes:     Pupils: Pupils are equal, round, and reactive to light.  Cardiovascular:     Rate and Rhythm: Normal rate.  Pulmonary:     Effort: Pulmonary effort is normal.     Breath sounds: No stridor.  Abdominal:     Hernia: A hernia is present. Hernia is present in the right inguinal area.  Comments: Incarcerated radial hernia.  Attempts to reduce were unsuccessful  Musculoskeletal:     Cervical back: Normal range of motion.  Neurological:     General: No focal deficit present.     Mental Status: He is alert and oriented to person, place, and time.  Psychiatric:        Mood and Affect: Mood normal.        Behavior: Behavior normal.     Assessment/Plan Incarcerated right inguinal hernia  Recommend emergent repair in the OR due to incarceration risk of strangulation.  Risks and benefits of surgery discussed.  Possible bowel resection discussed.  He agrees to proceed to the OR tonight emergently due to incarceration of right inguinal hernia with possible strangulation.The risk of hernia repair include bleeding,  Infection,   Recurrence of the hernia,  Mesh use, chronic pain,  Organ injury,  Bowel injury,  Bladder injury,   nerve injury with numbness around the incision,  Death,  and worsening of preexisting  medical problems.  The alternatives to surgery have been discussed as well..  Long term expectations of both operative and non operative treatments have been discussed.   The patient agrees to proceed.  Total time 40 minutes Dortha Schwalbe, MD 10/09/2021, 10:19 PM

## 2021-10-09 NOTE — ED Provider Notes (Signed)
Emergency Medicine Provider Triage Evaluation Note  Tim [name] , a 65 y.o. male  was evaluated in triage.  Pt complains of abdominal pain, nausea for the last several hours. Known right sided inguinal hernia. Coughed and pain in scrotum worsened. Worsening redness, tenderness. Abdominal pain rated 8-9/10. Hx of hepatitis, COPD, alcohol abuse. Smokes 1ppd tobacco. Denies chest pain, shortness of breath.  Review of Systems  Positive: As above Negative: As above  Physical Exam  There were no vitals taken for this visit. Gen:   Awake, no distress   Resp:  Normal effort  MSK:   Moves extremities without difficulty  Other:  Very TTP abdomen, especially in lower quadrants -- large mass in scrotum consistent with inguinal hernia, some redness but no clear evidence of incarceration  Medical Decision Making  Medically screening exam initiated at 6:06 PM.  Appropriate orders placed.  DYWANE PERUSKI was informed that the remainder of the evaluation will be completed by another provider, this initial triage assessment does not replace that evaluation, and the importance of remaining in the ED until their evaluation is complete.  Hernia complication   [place] 10/09/21 Merrily Brittle    Gloris [place], MD 10/09/21 [phone]

## 2021-10-10 DIAGNOSIS — K4030 Unilateral inguinal hernia, with obstruction, without gangrene, not specified as recurrent: Secondary | ICD-10-CM | POA: Diagnosis present

## 2021-10-10 DIAGNOSIS — D1730 Benign lipomatous neoplasm of skin and subcutaneous tissue of unspecified sites: Secondary | ICD-10-CM | POA: Diagnosis not present

## 2021-10-10 LAB — CREATININE, SERUM
Creatinine, Ser: 0.88 mg/dL (ref 0.61–1.24)
GFR, Estimated: >60 mL/min (ref >60)

## 2021-10-10 MED ORDER — OXYCODONE HCL 5 MG PO TABS
5.0000 mg | ORAL_TABLET | ORAL | Status: DC | PRN
  Administered 2021-10-10: 05:00:00 5 mg via ORAL
  Administered 2021-10-10: 11:00:00 10 mg via ORAL
  Filled 2021-10-10: qty 1
  Filled 2021-10-10: qty 2

## 2021-10-10 MED ORDER — ALBUTEROL SULFATE (2.5 MG/3ML) 0.083% IN NEBU: 2.5000 mg | INHALATION_SOLUTION | Freq: Four times a day (QID) | RESPIRATORY_TRACT | Status: DC | PRN

## 2021-10-10 MED ORDER — ONDANSETRON HCL 4 MG/2ML IJ SOLN: 4.0000 mg | Freq: Once | INTRAMUSCULAR | Status: DC | PRN

## 2021-10-10 MED ORDER — SERTRALINE HCL 50 MG PO TABS
50.0000 mg | ORAL_TABLET | Freq: Every day | ORAL | Status: DC
  Administered 2021-10-10: 11:00:00 50 mg via ORAL
  Filled 2021-10-10: qty 1

## 2021-10-10 MED ORDER — AMISULPRIDE (ANTIEMETIC) 5 MG/2ML IV SOLN: 10.0000 mg | Freq: Once | INTRAVENOUS | Status: DC | PRN

## 2021-10-10 MED ORDER — ACETAMINOPHEN 325 MG PO TABS
650.0000 mg | ORAL_TABLET | Freq: Four times a day (QID) | ORAL | Status: DC | PRN
  Administered 2021-10-10: 05:00:00 650 mg via ORAL
  Filled 2021-10-10: qty 2

## 2021-10-10 MED ORDER — 0.9 % SODIUM CHLORIDE (POUR BTL) OPTIME
TOPICAL | Status: DC | PRN
  Administered 2021-10-09: 1000 mL

## 2021-10-10 MED ORDER — ACETAMINOPHEN 650 MG RE SUPP: 650.0000 mg | Freq: Four times a day (QID) | RECTAL | Status: DC | PRN

## 2021-10-10 MED ORDER — DEXMEDETOMIDINE (PRECEDEX) IN NS 20 MCG/5ML (4 MCG/ML) IV SYRINGE
PREFILLED_SYRINGE | INTRAVENOUS | Status: DC | PRN
  Administered 2021-10-10: 4 ug via INTRAVENOUS

## 2021-10-10 MED ORDER — ONDANSETRON 4 MG PO TBDP: 4.0000 mg | ORAL_TABLET | Freq: Four times a day (QID) | ORAL | Status: DC | PRN

## 2021-10-10 MED ORDER — LORAZEPAM 1 MG PO TABS: 1.0000 mg | ORAL_TABLET | ORAL | Status: DC | PRN

## 2021-10-10 MED ORDER — CHLORHEXIDINE GLUCONATE CLOTH 2 % EX PADS
6.0000 | MEDICATED_PAD | Freq: Once | CUTANEOUS | Status: AC
  Administered 2021-10-10: 02:00:00 6 via TOPICAL

## 2021-10-10 MED ORDER — OXYCODONE HCL 5 MG PO TABS: 5.0000 mg | ORAL_TABLET | Freq: Four times a day (QID) | ORAL | 0 refills | Status: DC | PRN

## 2021-10-10 MED ORDER — FENTANYL CITRATE PF 50 MCG/ML IJ SOSY: 25.0000 ug | PREFILLED_SYRINGE | INTRAMUSCULAR | Status: DC | PRN

## 2021-10-10 MED ORDER — METHOCARBAMOL 500 MG PO TABS: 500.0000 mg | ORAL_TABLET | Freq: Four times a day (QID) | ORAL | Status: DC | PRN

## 2021-10-10 MED ORDER — OXYCODONE HCL 5 MG/5ML PO SOLN: 5.0000 mg | Freq: Once | ORAL | Status: DC | PRN

## 2021-10-10 MED ORDER — CEFAZOLIN SODIUM-DEXTROSE 2-4 GM/100ML-% IV SOLN
2.0000 g | INTRAVENOUS | Status: DC
  Filled 2021-10-10: qty 100

## 2021-10-10 MED ORDER — MOMETASONE FURO-FORMOTEROL FUM 200-5 MCG/ACT IN AERO
2.0000 | INHALATION_SPRAY | Freq: Two times a day (BID) | RESPIRATORY_TRACT | Status: DC
  Administered 2021-10-10: 09:00:00 2 via RESPIRATORY_TRACT
  Filled 2021-10-10: qty 8.8

## 2021-10-10 MED ORDER — BUPIVACAINE-EPINEPHRINE 0.25% -1:200000 IJ SOLN
INTRAMUSCULAR | Status: DC | PRN
  Administered 2021-10-09: 50 mL

## 2021-10-10 MED ORDER — METHOCARBAMOL 500 MG PO TABS
500.0000 mg | ORAL_TABLET | Freq: Four times a day (QID) | ORAL | 0 refills | Status: DC | PRN
Start: 2021-10-10 — End: 2022-03-12

## 2021-10-10 MED ORDER — METOPROLOL TARTRATE 5 MG/5ML IV SOLN: 5.0000 mg | Freq: Four times a day (QID) | INTRAVENOUS | Status: DC | PRN

## 2021-10-10 MED ORDER — FENTANYL CITRATE PF 50 MCG/ML IJ SOSY: 50.0000 ug | PREFILLED_SYRINGE | INTRAMUSCULAR | Status: DC | PRN

## 2021-10-10 MED ORDER — DEXTROSE-NACL 5-0.9 % IV SOLN: INTRAVENOUS | Status: DC

## 2021-10-10 MED ORDER — ONDANSETRON HCL 4 MG/2ML IJ SOLN
INTRAMUSCULAR | Status: DC | PRN
  Administered 2021-10-10: 4 mg via INTRAVENOUS

## 2021-10-10 MED ORDER — ENOXAPARIN SODIUM 40 MG/0.4ML IJ SOSY: 40.0000 mg | PREFILLED_SYRINGE | INTRAMUSCULAR | Status: DC

## 2021-10-10 MED ORDER — EPHEDRINE SULFATE-NACL 50-0.9 MG/10ML-% IV SOSY
PREFILLED_SYRINGE | INTRAVENOUS | Status: DC | PRN
  Administered 2021-10-10: 5 mg via INTRAVENOUS

## 2021-10-10 MED ORDER — SUGAMMADEX SODIUM 200 MG/2ML IV SOLN
INTRAVENOUS | Status: DC | PRN
Start: 2021-10-10 — End: 2021-10-10
  Administered 2021-10-10: 200 mg via INTRAVENOUS

## 2021-10-10 MED ORDER — OXYCODONE HCL 5 MG PO TABS: 5.0000 mg | ORAL_TABLET | Freq: Once | ORAL | Status: DC | PRN

## 2021-10-10 MED ORDER — ONDANSETRON HCL 4 MG/2ML IJ SOLN: 4.0000 mg | Freq: Four times a day (QID) | INTRAMUSCULAR | Status: DC | PRN

## 2021-10-10 NOTE — Transfer of Care (Signed)
Immediate Anesthesia Transfer of Care Note  Patient: Tim [name]  Procedure(s) Performed: Procedure(s): HERNIA REPAIR INGUINAL INCARCERATED (Right)  Patient Location: PACU  Anesthesia Type:General  Level of Consciousness: Alert, Awake, Oriented  Airway & Oxygen Therapy: Patient Spontanous Breathing  Post-op Assessment: Report given to RN  Post vital signs: Reviewed and stable  Last Vitals:  Vitals:   10/09/21 2245 10/09/21 2315  BP: 105/61 112/80  Pulse: 88 73  Resp: 16 16  Temp:  36.7 C  SpO2: 92% 92%    Complications: No apparent anesthesia complications

## 2021-10-10 NOTE — Discharge Instructions (Signed)
[facility] _______[facility]  UMBILICAL OR INGUINAL HERNIA REPAIR: POST OP INSTRUCTIONS  Always review your discharge instruction sheet given to you by the facility where your surgery was performed. IF YOU HAVE DISABILITY OR FAMILY LEAVE FORMS, YOU MUST BRING THEM TO THE OFFICE FOR PROCESSING.   DO NOT GIVE THEM TO YOUR DOCTOR.  1. A  prescription for pain medication may be given to you upon discharge.  Take your pain medication as prescribed, if needed.  If narcotic pain medicine is not needed, then you may take acetaminophen (Tylenol) or ibuprofen (Advil) as needed. 2. Take your usually prescribed medications unless otherwise directed. If you need a refill on your pain medication, please contact your pharmacy.  They will contact our office to request authorization. Prescriptions will not be filled after 5 pm or on week-ends. 3. You should follow a light diet the first 24 hours after arrival home, such as soup and crackers, etc.  Be sure to include lots of fluids daily.  Resume your normal diet the day after surgery. 4.Most patients will experience some swelling and bruising around the umbilicus or in the groin and scrotum.  Ice packs and reclining will help.  Swelling and bruising can take several days to resolve.  6. It is common to experience some constipation if taking pain medication after surgery.  Increasing fluid intake and taking a stool softener (such as Colace) will usually help or prevent this problem from occurring.  A mild laxative (Milk of Magnesia or Miralax) should be taken according to package directions if there are no bowel movements after 48 hours. 7. Unless discharge instructions indicate otherwise, you may remove your bandages 24-48 hours after surgery, and you may shower at that time.  You may have steri-strips (small skin tapes) in place directly over the incision.  These strips should be left on the skin for 7-10 days.  If your surgeon used skin glue on the  incision, you may shower in 24 hours.  The glue will flake off over the next 2-3 weeks.  Any sutures or staples will be removed at the office during your follow-up visit. 8. ACTIVITIES:  You may resume regular (light) daily activities beginning the next day--such as daily self-care, walking, climbing stairs--gradually increasing activities as tolerated.  You may have sexual intercourse when it is comfortable.  Refrain from any heavy lifting or straining until approved by your doctor.  a.You may drive when you are no longer taking prescription pain medication, you can comfortably wear a seatbelt, and you can safely maneuver your car and apply brakes. b.RETURN TO WORK:   _____________________________________________  9.You should see your doctor in the office for a follow-up appointment approximately 2-3 weeks after your surgery.  Make sure that you call for this appointment within a day or two after you arrive home to insure a convenient appointment time. 10.OTHER INSTRUCTIONS: _________________________    _____________________________________  WHEN TO CALL YOUR DOCTOR: Fever over 101.0 Inability to urinate Nausea and/or vomiting Extreme swelling or bruising Continued bleeding from incision. Increased pain, redness, or drainage from the incision  The clinic staff is available to answer your questions during regular business hours.  Please don't hesitate to call and ask to speak to one of the nurses for clinical concerns.  If you have a medical emergency, go to the nearest emergency room or call 911.  A surgeon from [facility] is always on call at the hospital   [address], [address],   [place], [place]  [address] ?  [address] [address], [place], [place]   [address] [phone] ? [phone] ? FAX [phone] Web site: [email or web address]  

## 2021-10-10 NOTE — Op Note (Signed)
Preoperative diagnosis: Incarcerated right inguinal hernia  Postoperative diagnosis: Incarcerated right inguinal hernia and subcutaneous lipoma  Procedure: Repair of right inguinal hernia with ultra Pro hernia system mesh and excision of lipoma from right groin  Surgeon: Erroll Luna, MD  Anesthesia: General with 0.25% Marcaine plain  EBL: Minimal  Specimen: Lipoma  Drains: None  IV fluids: Per anesthesia record  Indications for procedure: The patient is a 65 year old male who came to emergency room with a sudden onset of sharp right groin pain and swelling.  This started today.  This was associate with nausea and vomiting.  He was worked up in the emergency room and a CT scan showed what appeared to be an incarcerated right inguinal hernia with loops of bowel.  I reviewed it and felt that this was more likely omentum.  On exam, he had a mass in his right groin that I could not reduce in the inguinal canal.  I discussed the pros and cons of surgery but felt that with incarceration he required emergent repair.  He agreed to proceed.  We discussed bowel resection as well if that was necessary.The risk of hernia repair include bleeding,  Infection,   Recurrence of the hernia,  Mesh use, chronic pain,  Organ injury,  Bowel injury,  Bladder injury,   nerve injury with numbness around the incision,  Death,  and worsening of preexisting  medical problems.  The alternatives to surgery have been discussed as well..  Long term expectations of both operative and non operative treatments have been discussed.   The patient agrees to proceed.    Description of procedure: The patient was met in the holding area.  Right groin was marked as correct site and per procedure reviewed again.  He was then taken back to the operative room.  He was placed supine upon the OR table.  After induction of general anesthesia, the right groin was prepped and draped in sterile fashion timeout performed.  Proper patient, site  and procedure were verified.  Incision was made over the mass.  Dissection was carried down and he had a large lipoma overlying the inguinal canal.  This was excised.  This was about 8 cm in maximal diameter.  I then found the fibers of the external bleak in the external ring.  Scalpel was used to make a small incision in the external oblique and the fibers were opened through the through the external ring.  This opened the inguinal canal.  I then dissected around the cord structures.  There is a large hernia sac and with some what appeared to be omental fat that I reduced easily.  There is no evidence of bowel in the hernia sac.  This carried down into the scrotum.  He also had a hydrocele.  I opened the hydrocele and evacuated the fluid.  This was the bottom of the inguinal hernia sac and it was a large indirect hernia.  I was able then to find the in the sac and dissect this off the cord structures carefully preserving the cord structures.  This was dissected all way back to the internal ring.  I then reduced the sac back in the abdominal cavity in the preperitoneal space with a small cord lipoma with it.  A large hernia Pro system was used.  The inner leaflet was placed into the preperitoneal space and deployed.  The onlay was placed on the floor of the inguinal canal.  A slit was cut the cord structures.  The  mesh was secured to the shelving edge of the inguinal ligament, pubic tubercle and conjoined tendon with #1 Novafil pop-off sutures.  The mesh was closed around the cord carefully with the same suture.  There was ample room for the cord to exit.  The ilioinguinal nerve was divided as it exited the muscle since it was going to be trapped in the mesh and I am concerned that this would cause postoperative pain entrapment.  He is also a smoker giving him a higher risk of that as well.  The mesh laid quite flat with no undue tension with good closure of the defect.  Hemostasis was achieved.  The testicle was  reexamined and found to be intact with no signs of ischemia or injury.  He was placed back in the scrotum.  There is no twisting or kinking of the testicle.  We then closed the aponeurosis of the external bleak with 2-0 Vicryl.  3-0 Vicryl was used approximate Scarpa's fascia 4 Monocryl used to close skin in a subcuticular fashion.  Dermabond is applied.  All counts found to be correct.  The patient was awoke extubated taken to recovery in satisfactory condition.

## 2021-10-10 NOTE — Progress Notes (Signed)
Assessment uncharged. Pain managed and pt ambulatory. Pt verbalized understanding of dc instructions and follow up care. Discharged via foot per request accompanied by brother.

## 2021-10-10 NOTE — Discharge Summary (Signed)
Physician Discharge Summary  Patient ID: Tim [name] MRN: [id number] DOB/AGE: September 19, 1956 65 y.o.  Admit date: 10/09/2021 Discharge date: 10/10/2021  Admission Diagnoses:  Discharge Diagnoses:  Principal Problem:   Inguinal hernia with gangrene Active Problems:   Incarcerated right inguinal hernia   Discharged Condition: good  Hospital Course: Patient did well after repair of right inguinal hernia.  He had good pain control and could ambulate.  No signs of bowel obstruction.  His diet was advanced.  He was discharged home in stable condition.  Consults: None    Treatments: surgery: Right inguinal hernia repair with mesh and removal of lipoma  Discharge Exam: Blood pressure 119/73, pulse 73, temperature 98.4 F (36.9 C), temperature source Oral, resp. rate 18, height 6' 2.5" (1.892 m), weight 72.6 kg, SpO2 97 %. General appearance: alert and cooperative Resp: Occasional wheezing Cardio: regular rate and rhythm Incision/Wound: Right groin incision clean dry intact without hematoma  Disposition: Discharge disposition: 01-Home or Self Care       Discharge Instructions     Diet - low sodium heart healthy   Complete by: As directed    Increase activity slowly   Complete by: As directed       Allergies as of 10/10/2021   No Known Allergies      Medication List     TAKE these medications    albuterol 108 (90 Base) MCG/ACT inhaler Commonly known as: VENTOLIN HFA Inhale 1-2 puffs into the lungs every 6 (six) hours as needed for wheezing or shortness of breath.   ibuprofen 200 MG tablet Commonly known as: ADVIL Take 400 mg by mouth every 6 (six) hours as needed.   methocarbamol 500 MG tablet Commonly known as: ROBAXIN Take 1 tablet (500 mg total) by mouth every 6 (six) hours as needed for muscle spasms.   oxyCODONE 5 MG immediate release tablet Commonly known as: Oxy IR/ROXICODONE Take 1 tablet (5 mg total) by mouth every 6 (six) hours as needed for  severe pain.   predniSONE 10 MG (21) Tbpk tablet Commonly known as: STERAPRED UNI-PAK 21 TAB Take by mouth daily. Take 6 tabs by mouth daily  for 2 days, then 5 tabs for 2 days, then 4 tabs for 2 days, then 3 tabs for 2 days, 2 tabs for 2 days, then 1 tab by mouth daily for 2 days   sertraline 50 MG tablet Commonly known as: ZOLOFT Take 50 mg by mouth daily.   Symbicort 160-4.5 MCG/ACT inhaler Generic drug: budesonide-formoterol 1 puff 2 (two) times daily.         Signed: Dortha Schwalbe MD 10/10/2021, 7:38 AM

## 2021-10-12 ENCOUNTER — Encounter (HOSPITAL_COMMUNITY): Payer: Self-pay | Admitting: Surgery

## 2021-10-12 ENCOUNTER — Telehealth: Payer: Self-pay

## 2021-10-12 LAB — SURGICAL PATHOLOGY

## 2021-10-12 NOTE — Anesthesia Postprocedure Evaluation (Signed)
Anesthesia Post Note  Patient: Tim [name]  Procedure(s) Performed: HERNIA REPAIR INGUINAL INCARCERATED (Right)     Patient location during evaluation: PACU Anesthesia Type: General Level of consciousness: awake and alert Pain management: pain level controlled Vital Signs Assessment: post-procedure vital signs reviewed and stable Respiratory status: spontaneous breathing, nonlabored ventilation and respiratory function stable Cardiovascular status: blood pressure returned to baseline and stable Postop Assessment: no apparent nausea or vomiting Anesthetic complications: no   No notable events documented.  Last Vitals:  Vitals:   10/10/21 0342 10/10/21 0922  BP: 119/73   Pulse: 73   Resp: 18   Temp: 36.9 C   SpO2: 97% 91%    Last Pain:  Vitals:   10/10/21 1039  TempSrc:   PainSc: 8    Pain Goal: Patients Stated Pain Goal: 3 (10/10/21 1039)                 Lucretia Kern

## 2021-10-12 NOTE — Telephone Encounter (Signed)
Transition Care Management Follow-up Telephone Call Date of discharge and from where: 10/10/2021-[facility]  How have you been since you were released from the hospital? Patient stated he slipped yesterday and might have to return to the ED. He will see if he feels better.  Any questions or concerns? No  Items Reviewed: Did the pt receive and understand the discharge instructions provided? Yes  Medications obtained and verified? Yes  Other? No  Any new allergies since your discharge? No  Dietary orders reviewed? No Do you have support at home? Yes   Home Care and Equipment/Supplies: Were home health services ordered? not applicable If so, what is the name of the agency? N/A  Has the agency set up a time to come to the patient's home? not applicable Were any new equipment or medical supplies ordered?  No What is the name of the medical supply agency? N/A Were you able to get the supplies/equipment? not applicable Do you have any questions related to the use of the equipment or supplies? No  Functional Questionnaire: (I = Independent and D = Dependent) ADLs: I  Bathing/Dressing- I  Meal Prep- I  Eating- I  Maintaining continence- I  Transferring/Ambulation- I  Managing Meds- I  Follow up appointments reviewed:  PCP Hospital f/u appt confirmed? No   Specialist Hospital f/u appt confirmed? No   Are transportation arrangements needed? No  If their condition worsens, is the pt aware to call PCP or go to the Emergency Dept.? Yes Was the patient provided with contact information for the PCP's office or ED? Yes Was to pt encouraged to call back with questions or concerns? Yes

## 2021-11-10 ENCOUNTER — Emergency Department (HOSPITAL_COMMUNITY): Payer: Medicaid Other

## 2021-11-10 ENCOUNTER — Encounter (HOSPITAL_COMMUNITY): Payer: Self-pay | Admitting: Emergency Medicine

## 2021-11-10 ENCOUNTER — Other Ambulatory Visit: Payer: Self-pay

## 2021-11-10 ENCOUNTER — Emergency Department (HOSPITAL_COMMUNITY)
Admission: EM | Admit: 2021-11-10 | Discharge: 2021-11-10 | Disposition: A | Discharge status: Home / Self Care | Payer: Medicaid Other | Attending: Emergency Medicine | Admitting: Emergency Medicine

## 2021-11-10 DIAGNOSIS — R109 Unspecified abdominal pain: Secondary | ICD-10-CM | POA: Diagnosis not present

## 2021-11-10 DIAGNOSIS — Z043 Encounter for examination and observation following other accident: Secondary | ICD-10-CM | POA: Diagnosis not present

## 2021-11-10 DIAGNOSIS — R1031 Right lower quadrant pain: Secondary | ICD-10-CM | POA: Diagnosis not present

## 2021-11-10 DIAGNOSIS — N503 Cyst of epididymis: Secondary | ICD-10-CM | POA: Diagnosis not present

## 2021-11-10 DIAGNOSIS — N433 Hydrocele, unspecified: Secondary | ICD-10-CM | POA: Diagnosis not present

## 2021-11-10 DIAGNOSIS — R531 Weakness: Secondary | ICD-10-CM | POA: Diagnosis not present

## 2021-11-10 DIAGNOSIS — N4400 Torsion of testis, unspecified: Secondary | ICD-10-CM

## 2021-11-10 LAB — CBC WITH DIFFERENTIAL/PLATELET
Abs Immature Granulocytes: 0.01 10*3/uL (ref 0.00–0.07)
Basophils Absolute: 0.1 10*3/uL (ref 0.0–0.1)
Basophils Relative: 1 %
Eosinophils Absolute: 0.2 10*3/uL (ref 0.0–0.5)
Eosinophils Relative: 4 %
HCT: 43.5 % (ref 39.0–52.0)
Hemoglobin: 15.5 g/dL (ref 13.0–17.0)
Immature Granulocytes: 0 %
Lymphocytes Relative: 21 %
Lymphs Abs: 1.2 10*3/uL (ref 0.7–4.0)
MCH: 32.4 pg (ref 26.0–34.0)
MCHC: 35.6 g/dL (ref 30.0–36.0)
MCV: 91 fL (ref 80.0–100.0)
Monocytes Absolute: 0.4 10*3/uL (ref 0.1–1.0)
Monocytes Relative: 7 %
Neutro Abs: 3.8 10*3/uL (ref 1.7–7.7)
Neutrophils Relative %: 67 %
Platelets: 167 10*3/uL (ref 150–400)
RBC: 4.78 MIL/uL (ref 4.22–5.81)
RDW: 13.2 % (ref 11.5–15.5)
WBC: 5.7 10*3/uL (ref 4.0–10.5)
nRBC: 0 % (ref 0.0–0.2)

## 2021-11-10 LAB — BASIC METABOLIC PANEL
Anion gap: 6 (ref 5–15)
BUN: 16 mg/dL (ref 8–23)
CO2: 24 mmol/L (ref 22–32)
Calcium: 9.9 mg/dL (ref 8.9–10.3)
Chloride: 106 mmol/L (ref 98–111)
Creatinine, Ser: 0.93 mg/dL (ref 0.61–1.24)
GFR, Estimated: >60 mL/min (ref >60)
Glucose, Bld: 92 mg/dL (ref 70–99)
Potassium: 4.3 mmol/L (ref 3.5–5.1)
Sodium: 136 mmol/L (ref 135–145)

## 2021-11-10 MED ORDER — IBUPROFEN 800 MG PO TABS
800.0000 mg | ORAL_TABLET | Freq: Once | ORAL | Status: AC
  Administered 2021-11-10: 21:00:00 800 mg via ORAL
  Filled 2021-11-10: qty 1

## 2021-11-10 MED ORDER — IOHEXOL 300 MG/ML  SOLN
100.0000 mL | Freq: Once | INTRAMUSCULAR | Status: AC | PRN
  Administered 2021-11-10: 20:00:00 100 mL via INTRAVENOUS

## 2021-11-10 NOTE — ED Triage Notes (Signed)
Pt BIB EMS from home, c/o tear in wound incision opening. Recent surgery to repair groin wound. Pt fell 3 days ago, stated that the tear is gradually bigger. Reportedly dissolving stitches remains intact. PCP recommended pt to come to ED for evaluation.  BP 124/82 P 76 T 98.1 spO2 96%

## 2021-11-10 NOTE — Discharge Instructions (Addendum)
Tim [name] You were recently seen at [facility] for groin pain. We took pictures of your stomach and groin area which did not show any new or recurring hernia. Your prior wound from your surgery in December is healing well.   The imaging did show some enlargement of your liver and prostate. You should follow up with your primary care doctor about this.   We recommend that you follow up with a primary care doctor as well as your surgeons for further follow up after your surgery.  If you feel your symptoms are worsening, you develop increasing testicular pain, inability to empty your bladder, fever, or nausea/vomiting you should return to the ER for further evaluation.

## 2021-11-10 NOTE — ED Provider Notes (Signed)
Tim [name] Tim [name] Provider Note   CSN: [id number] Arrival date & time: 11/10/21  1704     History  Chief Complaint  Patient presents with   Wound Dehiscence    Tim [name] is a 66 y.o. male.  Patient is a 66 y.o. M with a PMH of an inguinal herania repair in Dec who is coming in with groin discomfort after he felt a tearing sensation. He was otherwise well prior to this episode. Denies any pain at the old incision site but is not sure whether his testicle is sitting up higher than it was prior to the tearing sensation he experienced.        Home Medications Prior to Admission medications   Medication Sig Start Date End Date Taking? Authorizing Provider  albuterol (VENTOLIN HFA) 108 (90 Base) MCG/ACT inhaler Inhale 1-2 puffs into the lungs every 6 (six) hours as needed for wheezing or shortness of breath. 02/24/20   Harris, Cammy Copa, PA-C  ibuprofen (ADVIL) 200 MG tablet Take 400 mg by mouth every 6 (six) hours as needed.    [provider]  methocarbamol (ROBAXIN) 500 MG tablet Take 1 tablet (500 mg total) by mouth every 6 (six) hours as needed for muscle spasms. 10/10/21   Cornett, Maisie Fus, MD  oxyCODONE (OXY IR/ROXICODONE) 5 MG immediate release tablet Take 1 tablet (5 mg total) by mouth every 6 (six) hours as needed for severe pain. 10/10/21   Cornett, Maisie Fus, MD  predniSONE (STERAPRED UNI-PAK 21 TAB) 10 MG (21) TBPK tablet Take by mouth daily. Take 6 tabs by mouth daily  for 2 days, then 5 tabs for 2 days, then 4 tabs for 2 days, then 3 tabs for 2 days, 2 tabs for 2 days, then 1 tab by mouth daily for 2 days Patient not taking: Reported on 10/09/2021 08/12/21   Jacalyn Lefevre, MD  sertraline (ZOLOFT) 50 MG tablet Take 50 mg by mouth daily. 09/06/21   [provider]  SYMBICORT 160-4.5 MCG/ACT inhaler 1 puff 2 (two) times daily. 06/01/21   [provider]      Allergies    Patient has no known allergies.    Review of  Systems   Review of Systems  Constitutional:  Positive for fever.  Genitourinary:  Positive for flank pain. Negative for penile discharge, penile pain, penile swelling, scrotal swelling and testicular pain.   Physical Exam Updated Vital Signs BP 106/77    Pulse (!) 57    Temp 98.2 F (36.8 C) (Oral)    Resp 16    Ht 6' 2.5" (1.892 m)    Wt 74.8 kg    SpO2 94%    BMI 20.90 kg/m  Physical Exam Exam conducted with a chaperone present.  Constitutional:      Appearance: Normal appearance.  HENT:     Head: Normocephalic and atraumatic.  Eyes:     Extraocular Movements: Extraocular movements intact.  Cardiovascular:     Rate and Rhythm: Normal rate and regular rhythm.  Pulmonary:     Effort: Pulmonary effort is normal.  Abdominal:     General: Abdomen is flat.     Palpations: Abdomen is soft.     Hernia: There is no hernia in the left inguinal area or right inguinal area.  Genitourinary:    Penis: Normal.      Comments: Healed incision from prior inguinal hernia repair on the right inguinal region with no tenderness, non-erythema, and no fluctuance. Right testicle retracted higher  than the left Skin:    General: Skin is warm and dry.  Neurological:     General: No focal deficit present.     Mental Status: He is alert. Mental status is at baseline.    ED Results / Procedures / Treatments   Labs (all labs ordered are listed, but only abnormal results are displayed) Labs Reviewed  BASIC METABOLIC PANEL  CBC WITH DIFFERENTIAL/PLATELET    EKG None  Radiology CT ABDOMEN PELVIS W CONTRAST  Result Date: 11/10/2021 CLINICAL DATA:  Abdominal pain, recent repair of right inguinal hernia EXAM: CT ABDOMEN AND PELVIS WITH CONTRAST TECHNIQUE: Multidetector CT imaging of the abdomen and pelvis was performed using the standard protocol following bolus administration of intravenous contrast. RADIATION DOSE REDUCTION: This exam was performed according to the departmental dose-optimization  program which includes automated exposure control, adjustment of the mA and/or kV according to patient size and/or use of iterative reconstruction technique. CONTRAST:  100mL OMNIPAQUE IOHEXOL 300 MG/ML  SOLN COMPARISON:  10/09/2021 FINDINGS: Lower chest: Thin linear densities in the lower lung fields may suggest scarring. Hepatobiliary: Liver measures 17.5 cm. There is fatty infiltration. In image 36 of series 2, there is 7 mm low-density structure with possible peripheral nodular enhancement in the inferior right lobe. There is no dilation of bile ducts. Gallbladder is unremarkable. Pancreas: There is prominence of pancreatic duct. No focal abnormality is seen. Spleen: Spleen measures 13.4 cm in AP diameter. Adrenals/Urinary Tract: Adrenals are unremarkable. There is no hydronephrosis. There are possible small cortical cysts in both kidneys. There are no renal or ureteral stones. Urinary bladder is unremarkable. Stomach/Bowel: Stomach is unremarkable. There is no significant dilation of small-bowel loops. There is fluid in the lumen of distal small-bowel loops. Appendix is not dilated. There is no significant wall thickening in colon. Scattered diverticula are seen in colon without signs of focal acute diverticulitis. Vascular/Lymphatic: Extensive atherosclerotic plaques and calcifications are seen in the aorta and its major branches. Reproductive: There is inhomogeneous attenuation and coarse calcifications in the prostate. There is 3 cm mixed density lesion projecting into the base of the urinary bladder. Other: There is evidence of interval right inguinal hernia repair. There is no demonstrable loculated fluid collection in the right inguinal canal. There is low-attenuation in both sides of scrotum possibly related to ascites and possible received small bowel loops in the right inguinal canal. Musculoskeletal: Degenerative changes are noted with bony spurs in the lumbar spine. There is disc space narrowing at  L5-S1 level. IMPRESSION: There is no evidence of intestinal obstruction or pneumoperitoneum. There is no hydronephrosis. Appendix is not dilated. There is interval right inguinal hernia repair. There are no abnormal fluid collections or bowel loops in the right inguinal canal. Possible bilateral renal cysts. Enlarged fatty liver. There is 7 mm low-density structure with possible peripheral nodular enhancement in the inferior aspect of right lobe of liver, possibly hemangioma. Enlarged spleen. Diverticulosis of colon without signs of focal diverticulitis. There is fluid in the lumen of distal small bowel loops which may be normal variation or suggest nonspecific enteritis. Prostate is enlarged projecting into the base of the urinary bladder with inhomogeneous enhancement. This may suggest benign prostatic hypertrophy or neoplasm of prostate. Other findings as described in the body of the report. Electronically Signed   By: Ernie AvenaPalani  Rathinasamy M.D.   On: 11/10/2021 20:58   US SCROTUM W/DOPPLER  Result Date: 11/10/2021 CLINICAL DATA:  Provided history: Rule out torsion. Right inguinal hernia repair 10/09/2021. Review of  clinical notes states wound incision opening. Fall 3 days ago. EXAM: SCROTAL ULTRASOUND DOPPLER ULTRASOUND OF THE TESTICLES TECHNIQUE: Complete ultrasound examination of the testicles, epididymis, and other scrotal structures was performed. Color and spectral Doppler ultrasound were also utilized to evaluate blood flow to the testicles. COMPARISON:  No prior scrotal ultrasound. FINDINGS: Right testicle Measurements: 4.6 x 2.0 x 3.2 cm. Homogeneous echogenicity. Normal blood flow. No mass or microlithiasis visualized. Left testicle Measurements: 4.9 x 2.2 x 3 cm. Homogeneous echogenicity. Normal blood flow. No mass or microlithiasis visualized. Right epididymis: 7 mm epididymal head cyst. Otherwise normal in size and appearance. Left epididymis:  Normal in size and appearance. Hydrocele:  Minimal on  the right. Varicocele:  None visualized. Pulsed Doppler interrogation of both testes demonstrates normal low resistance arterial and venous waveforms bilaterally. IMPRESSION: 1. No testicular torsion. Normal sonographic appearance of both testis. 2. Incidental subcentimeter right epididymal head cyst. 3. Trace right hydrocele. Electronically Signed   By: Narda Rutherford M.D.   On: 11/10/2021 18:45    Procedures Procedures    Medications Ordered in ED Medications  ibuprofen (ADVIL) tablet 800 mg (has no administration in time range)  iohexol (OMNIPAQUE) 300 MG/ML solution 100 mL (100 mLs Intravenous Contrast Given 11/10/21 2029)    ED Course/ Medical Decision Making/ A&P                           Medical Decision Making Amount and/or Complexity of Data Reviewed Radiology: ordered.   Patient coming in with inguinal pain after a recent hernia repair surgery in December. On exam wound is well healed with no erythema, warmth, fluctuance, or signs of infection. It is nontender to palpation. Patient's right testicle more elevated in comparison to left testicle raising concern for possible torsion though patient not in significant distress or pain. Ultrasound was obtained and independently reviewed by me and this was negative for torsion. CTAP was also obtained and reviewed by me to assess for further development of inguinal hernia or possible infection. Imaging showed interval development/healing from prior surgical repair and no signs of further hernia. Patient given pain medication and told to follow up with his primary care doctor to discuss incidental findings of prostate enlargement vs possible neoplasm as well as fatty liver. Patient deemed stable for discharge.   Final Clinical Impression(s) / ED Diagnoses Final diagnoses:  Right inguinal pain    Rx / DC Orders ED Discharge Orders     None         Ilene Qua, MD 11/10/21 2120    Milagros Loll, MD 11/12/21 [phone]

## 2021-11-10 NOTE — ED Notes (Signed)
To CT

## 2021-11-11 ENCOUNTER — Telehealth: Payer: Self-pay

## 2021-11-11 NOTE — Telephone Encounter (Signed)
Transition Care Management Unsuccessful Follow-up Telephone Call ° °Date of discharge and from where:  11/10/2021-[place]  ° °Attempts:  1st Attempt ° °Reason for unsuccessful TCM follow-up call:  Left voice message ° °  °

## 2021-11-12 NOTE — Telephone Encounter (Signed)
Transition Care Management Unsuccessful Follow-up Telephone Call ° °Date of discharge and from where:  11/10/2021-[place]  ° °Attempts:  2nd Attempt ° °Reason for unsuccessful TCM follow-up call:  Left voice message ° °  °

## 2021-11-15 DIAGNOSIS — N503 Cyst of epididymis: Secondary | ICD-10-CM | POA: Diagnosis not present

## 2021-11-15 DIAGNOSIS — N400 Enlarged prostate without lower urinary tract symptoms: Secondary | ICD-10-CM | POA: Diagnosis not present

## 2021-11-15 DIAGNOSIS — D490 Neoplasm of unspecified behavior of digestive system: Secondary | ICD-10-CM | POA: Diagnosis not present

## 2021-11-15 DIAGNOSIS — Z125 Encounter for screening for malignant neoplasm of prostate: Secondary | ICD-10-CM | POA: Diagnosis not present

## 2021-11-15 DIAGNOSIS — R1031 Right lower quadrant pain: Secondary | ICD-10-CM | POA: Diagnosis not present

## 2021-11-15 DIAGNOSIS — R7989 Other specified abnormal findings of blood chemistry: Secondary | ICD-10-CM | POA: Diagnosis not present

## 2021-11-15 NOTE — Telephone Encounter (Signed)
Transition Care Management Unsuccessful Follow-up Telephone Call ° °Date of discharge and from where:  11/10/2021-[facility]  ° °Attempts:  3rd Attempt ° °Reason for unsuccessful TCM follow-up call:  Left voice message ° °  °

## 2021-12-06 ENCOUNTER — Other Ambulatory Visit: Payer: Self-pay | Admitting: Surgery

## 2021-12-06 DIAGNOSIS — Z9889 Other specified postprocedural states: Secondary | ICD-10-CM

## 2021-12-08 IMAGING — CT CT ABD-PELV W/ CM
2 of 5 series · 16 of 46 positions shown, 18 images · IV contrast (omnipaque)
Comparison: None.

CLINICAL DATA: Bowel obstruction suspected. Right lower quadrant
pain.

EXAM:
CT ABDOMEN AND PELVIS WITH CONTRAST
TECHNIQUE: Multidetector CT imaging of the abdomen and pelvis was performed
using the standard protocol following bolus administration of
intravenous contrast.
CONTRAST:  100mL OMNIPAQUE IOHEXOL 300 MG/ML  SOLN

[Series 2: axial st · axial · 0.79mm/px · z∈[+1004,+1419]mm · 13 of 97 slices shown, 15 images]
[im 7/97  soft-tissue]
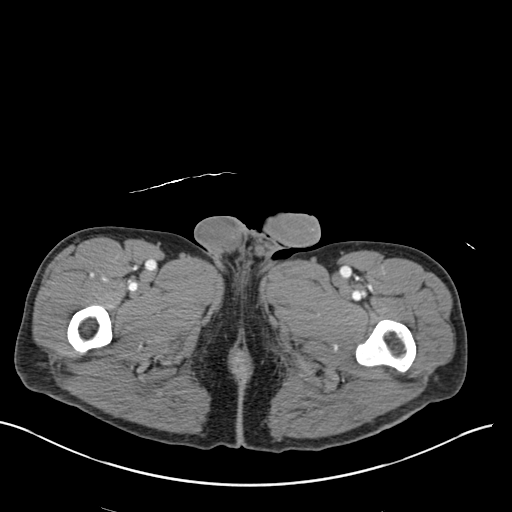
[im 7/97  bone]
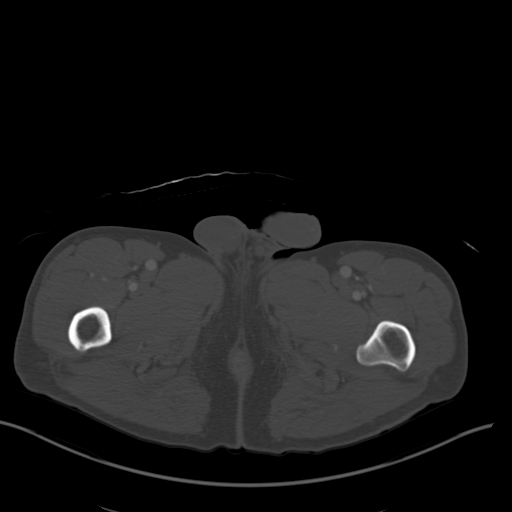
[im 13/97  soft-tissue]
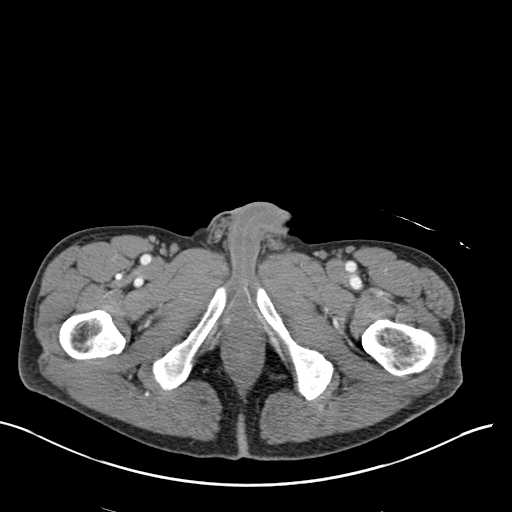
[im 20/97  soft-tissue]
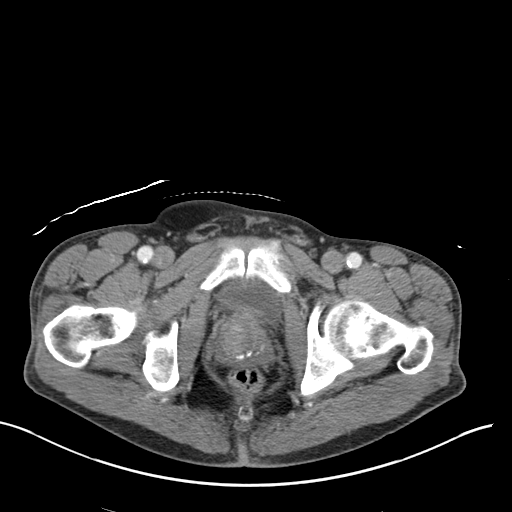
[im 26/97  soft-tissue]
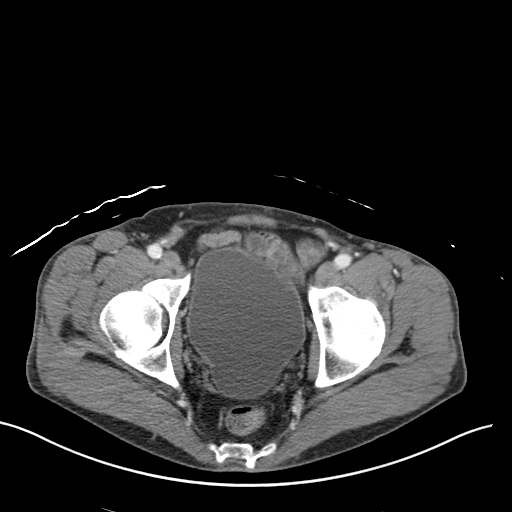
[im 33/97  soft-tissue]
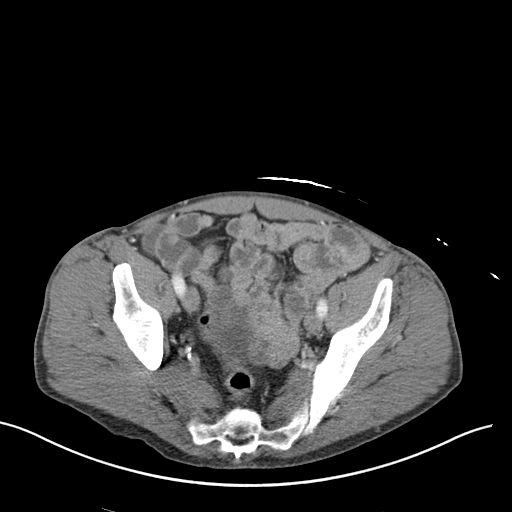
[im 39/97  soft-tissue]
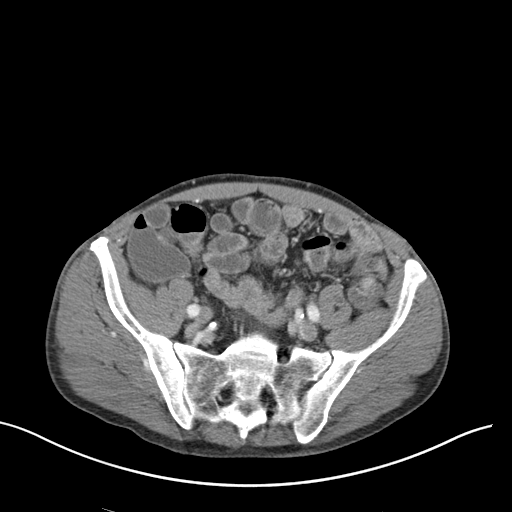
[im 52/97  soft-tissue]
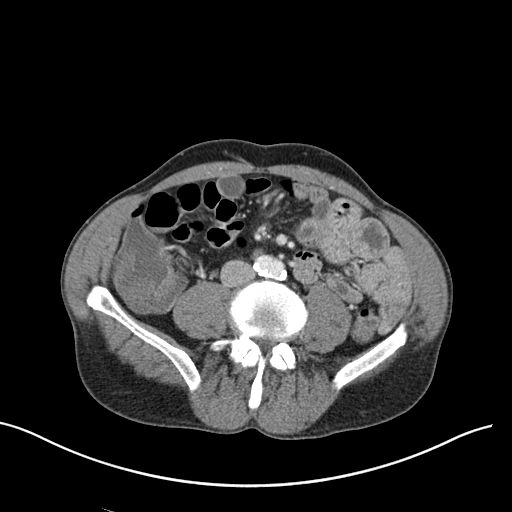
[im 58/97  soft-tissue]
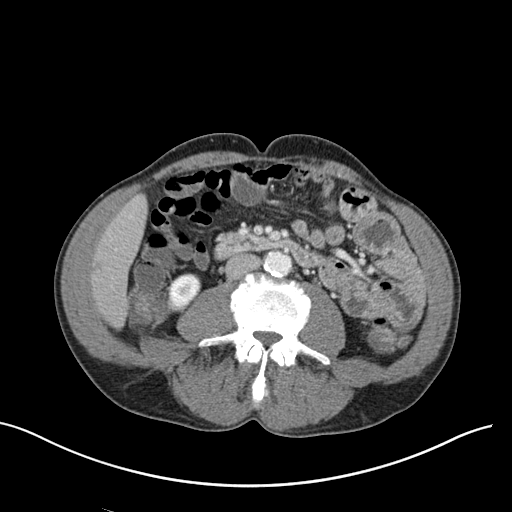
[im 65/97  soft-tissue]
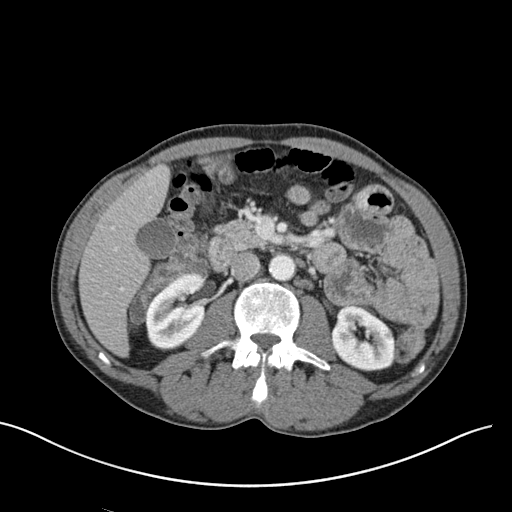
[im 65/97  bone]
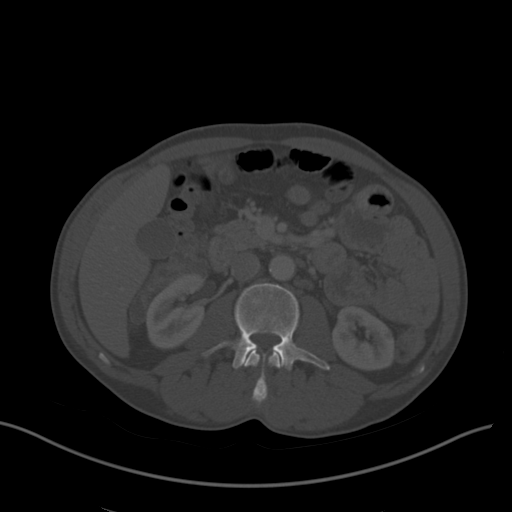
[im 71/97  soft-tissue]
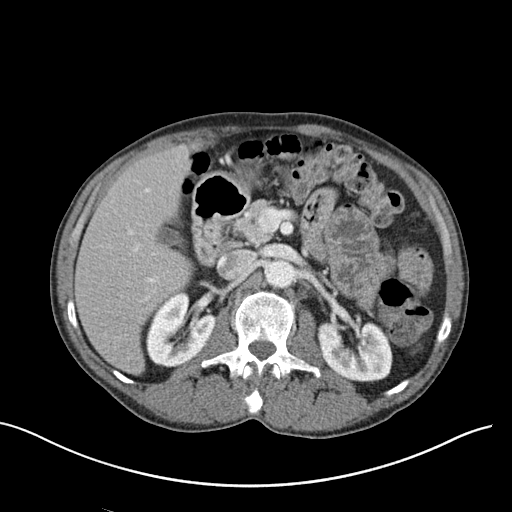
[im 77/97  soft-tissue]
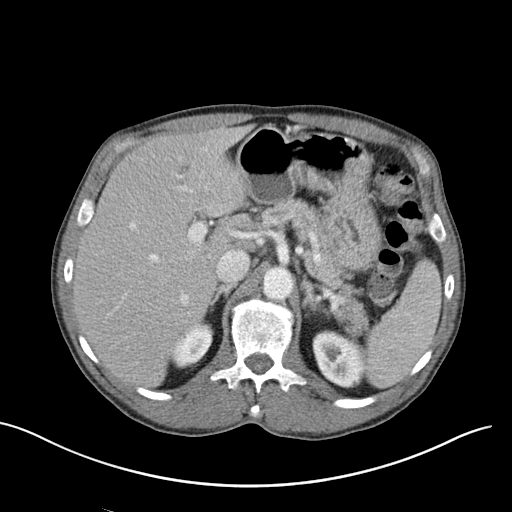
[im 84/97  soft-tissue]
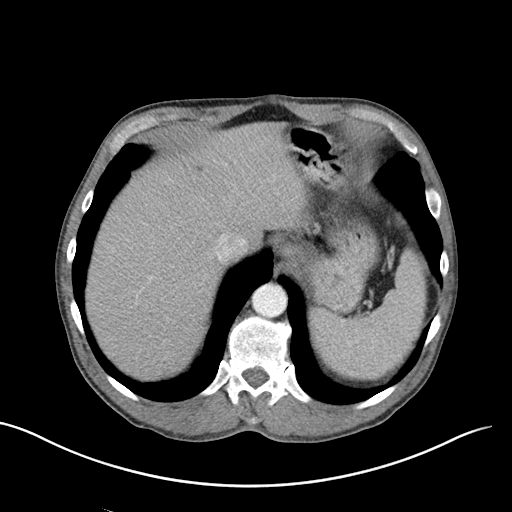
[im 90/97  soft-tissue]
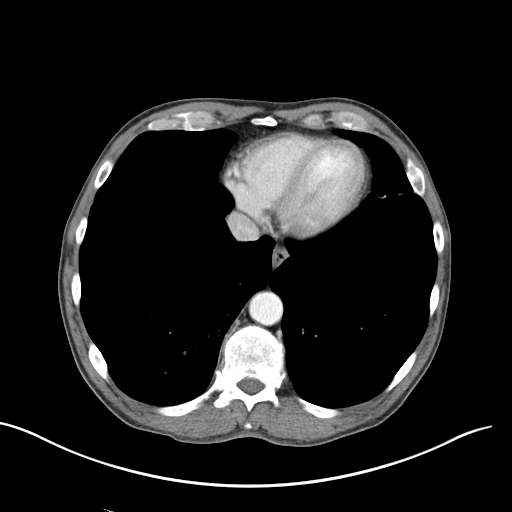

[Series 5: coronal st · coronal · 0.67mm/px · 3 of 151 slices shown]
[im 51/151  soft-tissue]
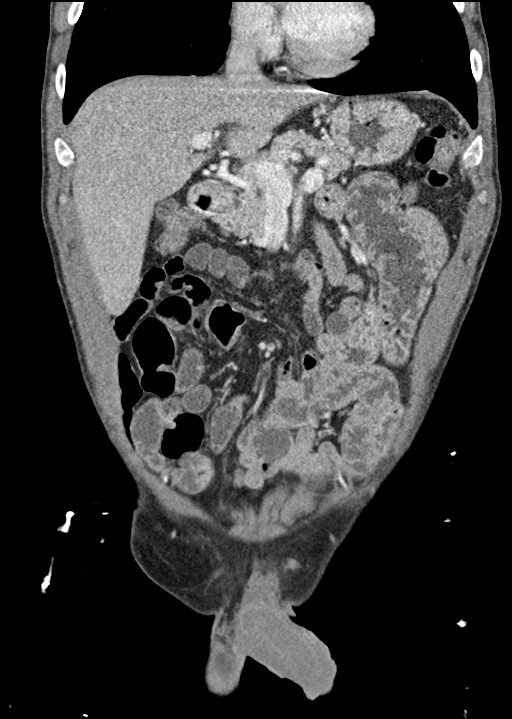
[im 67/151  soft-tissue]
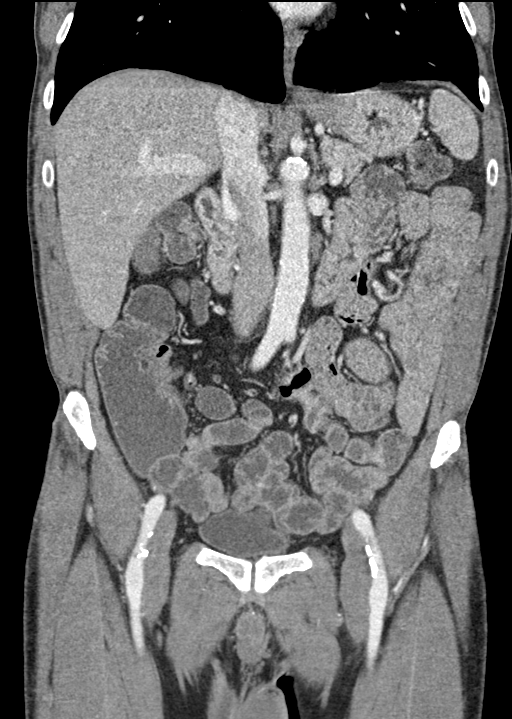
[im 84/151  soft-tissue]
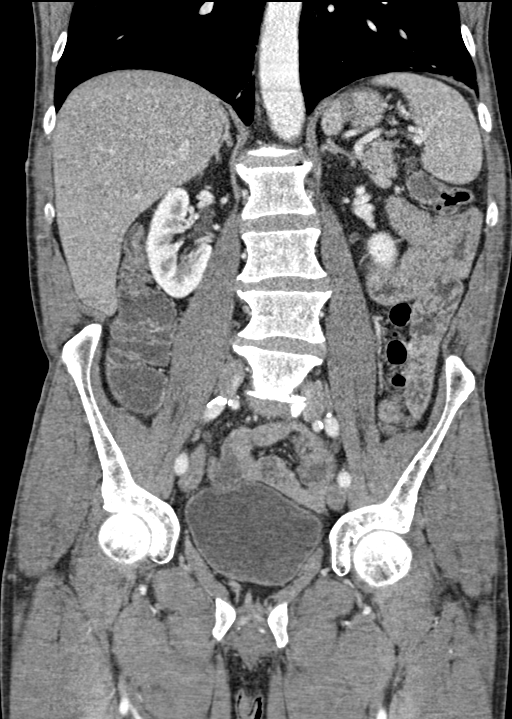

[16 of 46 positions shown; findings below may reference images not displayed]

FINDINGS: Lower chest: Scarring in the lung bases.  No acute abnormality.

Hepatobiliary: Diffuse low-density throughout the liver compatible
with fatty infiltration. No focal abnormality. Gallbladder
unremarkable.

Pancreas: No focal abnormality or ductal dilatation.

Spleen: No focal abnormality.  Normal size.

Adrenals/Urinary Tract: No adrenal abnormality. No focal renal
abnormality. No stones or hydronephrosis. Urinary bladder is
unremarkable.

Stomach/Bowel: Normal appendix. No evidence of bowel obstruction.
Right colon is fluid-filled, nonspecific. No wall thickening.

Vascular/Lymphatic: Calcified aorta and iliac vessels. No evidence
of aneurysm or adenopathy.

Reproductive: Mildly prominent prostate with central calcifications.

Other: No free fluid or free air.

Musculoskeletal: No acute bony abnormality.
IMPRESSION: Nonobstructive bowel gas pattern.  Normal appendix.

Right colon is fluid-filled, nonspecific. This could reflect
diarrheal process or gastroenteritis. No wall thickening to suggest
colitis.

Aortoiliac atherosclerosis.

Prostate enlargement.

## 2021-12-09 ENCOUNTER — Other Ambulatory Visit: Payer: Self-pay | Admitting: Surgery

## 2021-12-09 DIAGNOSIS — R1031 Right lower quadrant pain: Secondary | ICD-10-CM

## 2021-12-09 DIAGNOSIS — Z9889 Other specified postprocedural states: Secondary | ICD-10-CM

## 2021-12-10 DIAGNOSIS — R1111 Vomiting without nausea: Secondary | ICD-10-CM | POA: Diagnosis not present

## 2021-12-10 DIAGNOSIS — R062 Wheezing: Secondary | ICD-10-CM | POA: Diagnosis not present

## 2021-12-31 ENCOUNTER — Inpatient Hospital Stay: Admission: RE | Admit: 2021-12-31 | Payer: Medicaid Other | Source: Ambulatory Visit

## 2022-02-02 ENCOUNTER — Encounter (HOSPITAL_COMMUNITY): Payer: Self-pay

## 2022-02-02 ENCOUNTER — Emergency Department (HOSPITAL_COMMUNITY)
Admission: EM | Admit: 2022-02-02 | Discharge: 2022-02-03 | Disposition: A | Discharge status: Home / Self Care | Payer: Medicaid Other | Attending: Emergency Medicine | Admitting: Emergency Medicine

## 2022-02-02 ENCOUNTER — Other Ambulatory Visit: Payer: Self-pay

## 2022-02-02 DIAGNOSIS — F10129 Alcohol abuse with intoxication, unspecified: Secondary | ICD-10-CM | POA: Diagnosis present

## 2022-02-02 DIAGNOSIS — F10920 Alcohol use, unspecified with intoxication, uncomplicated: Secondary | ICD-10-CM

## 2022-02-02 NOTE — ED Triage Notes (Signed)
Pt BIB EMS, pick up from [employer]. States he needs help with detox, endorses marijuana and alcohol use today.  ?

## 2022-02-03 NOTE — ED Provider Notes (Signed)
?[facility]-EMERGENCY DEPT ?Provider Note ? ? ?CSN: [id number] ?Arrival date & time: 02/02/22  2305 ? ?  ? ?History ? ?Chief Complaint  ?Patient presents with  ? Alcohol Intoxication  ? ? ?Tim [name] is a 66 y.o. male. ? ?66 year old male presents to the ER for unclear reasons.  Patient is intoxicated and is not having coherent speech at this time.  [place] country and [place] for mental health help.  He denies suicidality homicidality.  States he drinks.  Not really able to understand much else secondary to intoxication. ? ?Review the records patient has been here few times recently for similar situations requesting help for alcohol withdrawal even though he is actively intoxicated.  He has been admitted a couple times for reported delirium tremens and alcohol withdrawal and is soon as he goes home he starts drinking again. ? ? ?Alcohol Intoxication ? ? ?  ? ?Home Medications ?Prior to Admission medications   ?Medication Sig Start Date End Date Taking? Authorizing Provider  ?albuterol (VENTOLIN HFA) 108 (90 Base) MCG/ACT inhaler Inhale 1-2 puffs into the lungs every 6 (six) hours as needed for wheezing or shortness of breath. 02/24/20   Arthor Captain, PA-C  ?ibuprofen (ADVIL) 200 MG tablet Take 400 mg by mouth every 6 (six) hours as needed.    [provider]  ?methocarbamol (ROBAXIN) 500 MG tablet Take 1 tablet (500 mg total) by mouth every 6 (six) hours as needed for muscle spasms. 10/10/21   Cornett, Maisie Fus, MD  ?oxyCODONE (OXY IR/ROXICODONE) 5 MG immediate release tablet Take 1 tablet (5 mg total) by mouth every 6 (six) hours as needed for severe pain. 10/10/21   Cornett, Maisie Fus, MD  ?predniSONE (STERAPRED UNI-PAK 21 TAB) 10 MG (21) TBPK tablet Take by mouth daily. Take 6 tabs by mouth daily  for 2 days, then 5 tabs for 2 days, then 4 tabs for 2 days, then 3 tabs for 2 days, 2 tabs for 2 days, then 1 tab by mouth daily for 2 days ?Patient not  taking: Reported on 10/09/2021 08/12/21   Jacalyn Lefevre, MD  ?sertraline (ZOLOFT) 50 MG tablet Take 50 mg by mouth daily. 09/06/21   [provider]  ?SYMBICORT 160-4.5 MCG/ACT inhaler 1 puff 2 (two) times daily. 06/01/21   [provider]  ?   ? ?Allergies    ?Patient has no known allergies.   ? ?Review of Systems   ?Review of Systems ? ?Physical Exam ?Updated Vital Signs ?BP 119/81   Pulse 63   Temp 98 ?F (36.7 ?C) (Oral)   Resp 18   Ht 6' 2.5" (1.892 m)   Wt 70.3 kg   SpO2 98%   BMI 19.63 kg/m?  ?Physical Exam ?Vitals and nursing note reviewed.  ?Constitutional:   ?   Appearance: He is well-developed.  ?HENT:  ?   Head: Normocephalic and atraumatic.  ?   Mouth/Throat:  ?   Mouth: Mucous membranes are moist.  ?   Pharynx: Oropharynx is clear.  ?Eyes:  ?   Pupils: Pupils are equal, round, and reactive to light.  ?Cardiovascular:  ?   Rate and Rhythm: Normal rate.  ?Pulmonary:  ?   Effort: Pulmonary effort is normal. No respiratory distress.  ?Abdominal:  ?   General: Abdomen is flat. There is no distension.  ?Musculoskeletal:     ?   General: Normal range of motion.  ?   Cervical back: Normal range of  motion.  ?Skin: ?   General: Skin is warm and dry.  ?Neurological:  ?   General: No focal deficit present.  ?   Mental Status: He is alert.  ? ? ?ED Results / Procedures / Treatments   ?Labs ?(all labs ordered are listed, but only abnormal results are displayed) ?Labs Reviewed - No data to display ? ?EKG ?None ? ?Radiology ?No results found. ? ?Procedures ?Procedures  ? ? ?Medications Ordered in ED ?Medications - No data to display ? ?ED Course/ Medical Decision Making/ A&P ?  ?                        ?Medical Decision Making ? ?Reeval when sober.  ? ?Patient sobered up. Came here because he was drinking. No SI/HI. No e/o active withdrawal, definitely no evidence of DT's.  ? ? ?Final Clinical Impression(s) / ED Diagnoses ?Final diagnoses:  ?Alcoholic intoxication without complication ([facility])   ? ? ?Rx / DC Orders ?ED Discharge Orders   ? ? None  ? ?  ? ? ?  ?Marily Memos, MD ?02/03/22 [phone] ? ?

## 2022-02-04 ENCOUNTER — Telehealth: Payer: Self-pay

## 2022-02-04 NOTE — Telephone Encounter (Signed)
Transition Care Management Follow-up Telephone Call ?Date of discharge and from where: 02/03/2022 from [facility] ?How have you been since you were released from the hospital? Patient stated that he is feeling better. Patient did not have any questions or concerns at this time.  ?Any questions or concerns? No ? ?Items Reviewed: ?Did the pt receive and understand the discharge instructions provided? Yes  ?Medications obtained and verified? Yes  ?Other? No  ?Any new allergies since your discharge? No  ?Dietary orders reviewed? No ?Do you have support at home? Yes  ? ?Functional Questionnaire: (I = Independent and D = Dependent) ?ADLs: I ? ?Bathing/Dressing- I ? ?Meal Prep- I ? ?Eating- I ? ?Maintaining continence- I ? ?Transferring/Ambulation- I ? ?Managing Meds- I ? ? ?Follow up appointments reviewed: ? ?PCP Hospital f/u appt confirmed? No   ?Specialist Hospital f/u appt confirmed? No   ?Are transportation arrangements needed? No  ?If their condition worsens, is the pt aware to call PCP or go to the Emergency Dept.? Yes ?Was the patient provided with contact information for the PCP's office or ED? Yes ?Was to pt encouraged to call back with questions or concerns? Yes ? ?

## 2022-02-06 ENCOUNTER — Encounter (HOSPITAL_COMMUNITY): Payer: Self-pay

## 2022-02-06 ENCOUNTER — Emergency Department (HOSPITAL_COMMUNITY)
Admission: EM | Admit: 2022-02-06 | Discharge: 2022-02-07 | Disposition: A | Discharge status: Home / Self Care | Payer: Medicaid Other | Attending: Emergency Medicine | Admitting: Emergency Medicine

## 2022-02-06 DIAGNOSIS — F10920 Alcohol use, unspecified with intoxication, uncomplicated: Secondary | ICD-10-CM

## 2022-02-06 DIAGNOSIS — K000 Anodontia: Secondary | ICD-10-CM | POA: Diagnosis not present

## 2022-02-06 DIAGNOSIS — F10120 Alcohol abuse with intoxication, uncomplicated: Secondary | ICD-10-CM | POA: Diagnosis present

## 2022-02-06 NOTE — ED Provider Triage Note (Signed)
Emergency Medicine Provider Triage Evaluation Note ? ?Tim [name] , a 66 y.o. male  was evaluated in triage.  Pt complains of alcohol intoxication.  He denies any homicidal or suicidal ideations. ? ?Review of Systems  ?Positive:  ?Negative: See above  ? ?Physical Exam  ?There were no vitals taken for this visit. ?Gen:   Awake, no distress  ?Resp:  Normal effort  ?MSK:   Moves extremities without difficulty  ?Other:   ? ?Medical Decision Making  ?Medically screening exam initiated at 10:48 PM.  Appropriate orders placed.  Tim [name] was informed that the remainder of the evaluation will be completed by another provider, this initial triage assessment does not replace that evaluation, and the importance of remaining in the ED until their evaluation is complete. ? ?Patient is intoxicated.  Difficult to understand.  No suicidal or homicidal behavior. ?  ?Teressa Lower, [place] ?02/06/22 2249 ? ?

## 2022-02-06 NOTE — ED Triage Notes (Signed)
Pt came in with c/o feeling lost. Speech is a bit incomprehensible due to alcohol intox. Pt just wants to talk to someone. Denies SI/HI ?

## 2022-02-07 NOTE — ED Provider Notes (Signed)
?[facility] DEPT ?Provider Note ? ? ?CSN: [id number] ?Arrival date & time: 02/06/22  2144 ? ?  ? ?History ? ?Chief Complaint  ?Patient presents with  ? Alcohol Intoxication  ? ? ?Tim [name] is a 66 y.o. male. ? ?The history is provided by the patient and medical records.  ?Alcohol Intoxication ? ?66 year old male presenting to the ED with alcohol intoxication.  He states "I needed a rest".  Denies any SI/HI.   ? ?Home Medications ?Prior to Admission medications   ?Medication Sig Start Date End Date Taking? Authorizing Provider  ?albuterol (VENTOLIN HFA) 108 (90 Base) MCG/ACT inhaler Inhale 1-2 puffs into the lungs every 6 (six) hours as needed for wheezing or shortness of breath. 02/24/20   Margarita Mail, PA-C  ?ibuprofen (ADVIL) 200 MG tablet Take 400 mg by mouth every 6 (six) hours as needed.    [provider]  ?methocarbamol (ROBAXIN) 500 MG tablet Take 1 tablet (500 mg total) by mouth every 6 (six) hours as needed for muscle spasms. 10/10/21   Cornett, Marcello Moores, MD  ?oxyCODONE (OXY IR/ROXICODONE) 5 MG immediate release tablet Take 1 tablet (5 mg total) by mouth every 6 (six) hours as needed for severe pain. 10/10/21   Cornett, Marcello Moores, MD  ?predniSONE (STERAPRED UNI-PAK 21 TAB) 10 MG (21) TBPK tablet Take by mouth daily. Take 6 tabs by mouth daily  for 2 days, then 5 tabs for 2 days, then 4 tabs for 2 days, then 3 tabs for 2 days, 2 tabs for 2 days, then 1 tab by mouth daily for 2 days ?Patient not taking: Reported on 10/09/2021 08/12/21   Isla Pence, MD  ?sertraline (ZOLOFT) 50 MG tablet Take 50 mg by mouth daily. 09/06/21   [provider]  ?SYMBICORT 160-4.5 MCG/ACT inhaler 1 puff 2 (two) times daily. 06/01/21   [provider]  ?   ? ?Allergies    ?Patient has no known allergies.   ? ?Review of Systems   ?Review of Systems  ?Constitutional:   ?     EtOH  ?All other systems reviewed and are negative. ? ?Physical Exam ?Updated Vital Signs ?BP  92/61   Pulse 90   Temp 98.6 ?F (37 ?C)   Resp 18   SpO2 96%  ? ?Physical Exam ?Vitals and nursing note reviewed.  ?Constitutional:   ?   Appearance: He is well-developed.  ?HENT:  ?   Head: Normocephalic and atraumatic.  ?   Mouth/Throat:  ?   Comments: Poor dentition, multiple missing teeth ?Eyes:  ?   Conjunctiva/sclera: Conjunctivae normal.  ?   Pupils: Pupils are equal, round, and reactive to light.  ?Cardiovascular:  ?   Rate and Rhythm: Normal rate and regular rhythm.  ?   Heart sounds: Normal heart sounds.  ?Pulmonary:  ?   Effort: Pulmonary effort is normal. No respiratory distress.  ?   Breath sounds: Normal breath sounds. No rhonchi.  ?Abdominal:  ?   General: Bowel sounds are normal.  ?   Palpations: Abdomen is soft.  ?Musculoskeletal:     ?   General: Normal range of motion.  ?   Cervical back: Normal range of motion.  ?Skin: ?   General: Skin is warm and dry.  ?Neurological:  ?   Mental Status: He is alert and oriented to person, place, and time.  ? ? ?ED Results / Procedures / Treatments   ?Labs ?(all labs ordered are listed, but only abnormal results are  displayed) ?Labs Reviewed - No data to display ? ?EKG ?None ? ?Radiology ?No results found. ? ?Procedures ?Procedures  ? ? ?Medications Ordered in ED ?Medications - No data to display ? ?ED Course/ Medical Decision Making/ A&P ?  ?                        ?Medical Decision Making ? ?66 y.o. M here acute intoxicated.  States "I needed a rest".  He has been observed in the ED for a total of 8+ hours without any complicating features.  He is awake, talkatively, with stable VS.  Feel he is stable for discharge home.  Encouraged to follow-up with PCP.  Can return here for any new/acute changes. ? ?Final Clinical Impression(s) / ED Diagnoses ?Final diagnoses:  ?Alcoholic intoxication without complication ([place])  ? ? ?Rx / DC Orders ?ED Discharge Orders   ? ? None  ? ?  ? ? ?  ?Larene Pickett, PA-C ?02/07/22 0604 ? ?  ?Ripley Fraise, MD ?02/07/22  [phone] ? ?

## 2022-02-07 NOTE — Discharge Instructions (Signed)
Follow up with your primary care doctor.

## 2022-02-08 ENCOUNTER — Telehealth: Payer: Self-pay

## 2022-02-08 NOTE — Telephone Encounter (Signed)
Transition Care Management Unsuccessful Follow-up Telephone Call ? ?Date of discharge and from where:  02/07/2022 from [facility] ? ?Attempts:  1st Attempt ? ?Reason for unsuccessful TCM follow-up call:  Left voice message ? ? ? ?

## 2022-02-09 NOTE — Telephone Encounter (Signed)
Transition Care Management Follow-up Telephone Call ?Date of discharge and from where: 02/07/2022 from [facility] ?How have you been since you were released from the hospital? Patient stated that he is feeling much better.  ?Any questions or concerns? No ? ?Items Reviewed: ?Did the pt receive and understand the discharge instructions provided? Yes  ?Medications obtained and verified? Yes  ?Other? No  ?Any new allergies since your discharge? No  ?Dietary orders reviewed? No ?Do you have support at home? Yes  ? ?Functional Questionnaire: (I = Independent and D = Dependent) ?ADLs: I ? ?Bathing/Dressing- I ? ?Meal Prep- I ? ?Eating- I ? ?Maintaining continence- I ? ?Transferring/Ambulation- I ? ?Managing Meds- I ? ? ?Follow up appointments reviewed: ? ?PCP Hospital f/u appt confirmed? No  ?Specialist Hospital f/u appt confirmed? No   ?Are transportation arrangements needed? No  ?If their condition worsens, is the pt aware to call PCP or go to the Emergency Dept.? Yes ?Was the patient provided with contact information for the PCP's office or ED? Yes ?Was to pt encouraged to call back with questions or concerns? Yes ? ?

## 2022-03-12 ENCOUNTER — Emergency Department (HOSPITAL_COMMUNITY): Payer: Medicaid Other

## 2022-03-12 ENCOUNTER — Emergency Department (HOSPITAL_COMMUNITY)
Admission: EM | Admit: 2022-03-12 | Discharge: 2022-03-12 | Disposition: A | Discharge status: Home / Self Care | Payer: Medicaid Other | Attending: Emergency Medicine | Admitting: Emergency Medicine

## 2022-03-12 ENCOUNTER — Other Ambulatory Visit: Payer: Self-pay

## 2022-03-12 ENCOUNTER — Encounter (HOSPITAL_COMMUNITY): Payer: Self-pay

## 2022-03-12 DIAGNOSIS — W208XXA Other cause of strike by thrown, projected or falling object, initial encounter: Secondary | ICD-10-CM | POA: Insufficient documentation

## 2022-03-12 DIAGNOSIS — R0789 Other chest pain: Secondary | ICD-10-CM

## 2022-03-12 DIAGNOSIS — Z7951 Long term (current) use of inhaled steroids: Secondary | ICD-10-CM | POA: Insufficient documentation

## 2022-03-12 DIAGNOSIS — J449 Chronic obstructive pulmonary disease, unspecified: Secondary | ICD-10-CM | POA: Diagnosis not present

## 2022-03-12 DIAGNOSIS — M25511 Pain in right shoulder: Secondary | ICD-10-CM | POA: Diagnosis present

## 2022-03-12 MED ORDER — LIDOCAINE 5 % EX PTCH
1.0000 | MEDICATED_PATCH | CUTANEOUS | Status: DC
  Administered 2022-03-12: 1 via TRANSDERMAL
  Filled 2022-03-12: qty 1

## 2022-03-12 MED ORDER — LIDOCAINE 4 % EX PTCH: 1.0000 | MEDICATED_PATCH | Freq: Two times a day (BID) | CUTANEOUS | 0 refills | Status: DC | PRN

## 2022-03-12 NOTE — ED Triage Notes (Signed)
Pt arrived via EMS from home reporting right shoulder pain and left rib pain after using a chainsaw to cut down a tree yesterday. Pt reports a tree limb falling down on him. Pt denies falling, hitting his head, or LOC.

## 2022-03-12 NOTE — Discharge Instructions (Addendum)
You came to the emergency department today to be evaluated for your right shoulder and chest wall pain.  The x-rays obtained did not show any broken bones or dislocations.  Please follow-up with the orthopedic provider for further assessment of your right shoulder.  You may apply lidocaine patches to your chest wall to help with pain.  You may also take Tylenol and ibuprofen as indicated below to help with your pain.  Please take Ibuprofen (Advil, motrin) and Tylenol (acetaminophen) to relieve your pain.    You may take up to 600 MG (3 pills) of normal strength ibuprofen every 8 hours as needed.   You make take tylenol, up to 1,000 mg (two extra strength pills) every 8 hours as needed.   It is safe to take ibuprofen and tylenol at the same time as they work differently.   Do not take more than 3,000 mg tylenol in a 24 hour period (not more than one dose every 8 hours.  Please check all medication labels as many medications such as pain and cold medications may contain tylenol.  Do not drink alcohol while taking these medications.  Do not take other NSAID'S while taking ibuprofen (such as aleve or naproxen).  Please take ibuprofen with food to decrease stomach upset.

## 2022-03-12 NOTE — ED Notes (Signed)
Pt transported to XR.  

## 2022-03-12 NOTE — ED Provider Notes (Signed)
[facility]-EMERGENCY DEPT Provider Note   CSN: [id number] Arrival date & time: 03/12/22  0109     History  Chief Complaint  Patient presents with   Arm Pain   Rib Injury    Tim [name] is a 66 y.o. male with history of alcohol use disorder, COPD, hepatitis C, depression, anxiety.  Presents to the emergency department with a chief complaint of right shoulder and left chest wall pain.  Patient reports that shoulder pain has been present for the last 2 days.  Pain started after "pulling hard," unable to start motor.  Pain has been constant since then.  Patient reports that pain is minimal when arm is Still.  With any movement patient has worsening pain.  Patient states that he is unable to lift his right arm due to his shoulder pain.  Patient is right-hand dominant.  Patient states that chest wall pain started today at 4 PM.  Patient states that he was doing yard work when a tree limb hit the left side of his chest.  Pain has been constant since then.  Pain is worse with movement.  Patient reports that he has been taking Advil for his pain with no improvement in symptoms.  Patient endorses drinking "a couple 58s," when asked about alcohol use.  Patient endorses marijuana use and states "I smoked a few joints."  Denies any numbness, weakness, color change, pallor, wound, shortness of breath, hemoptysis.    Arm Pain Associated symptoms include chest pain. Pertinent negatives include no abdominal pain, no headaches and no shortness of breath.      Home Medications Prior to Admission medications   Medication Sig Start Date End Date Taking? Authorizing Provider  albuterol (VENTOLIN HFA) 108 (90 Base) MCG/ACT inhaler Inhale 1-2 puffs into the lungs every 6 (six) hours as needed for wheezing or shortness of breath. 02/24/20   Harris, Cammy Copa, PA-C  ibuprofen (ADVIL) 200 MG tablet Take 400 mg by mouth every 6 (six) hours as needed.    [provider]   methocarbamol (ROBAXIN) 500 MG tablet Take 1 tablet (500 mg total) by mouth every 6 (six) hours as needed for muscle spasms. 10/10/21   Cornett, Maisie Fus, MD  oxyCODONE (OXY IR/ROXICODONE) 5 MG immediate release tablet Take 1 tablet (5 mg total) by mouth every 6 (six) hours as needed for severe pain. 10/10/21   Cornett, Maisie Fus, MD  predniSONE (STERAPRED UNI-PAK 21 TAB) 10 MG (21) TBPK tablet Take by mouth daily. Take 6 tabs by mouth daily  for 2 days, then 5 tabs for 2 days, then 4 tabs for 2 days, then 3 tabs for 2 days, 2 tabs for 2 days, then 1 tab by mouth daily for 2 days Patient not taking: Reported on 10/09/2021 08/12/21   Jacalyn Lefevre, MD  sertraline (ZOLOFT) 50 MG tablet Take 50 mg by mouth daily. 09/06/21   [provider]  SYMBICORT 160-4.5 MCG/ACT inhaler 1 puff 2 (two) times daily. 06/01/21   [provider]      Allergies    Patient has no known allergies.    Review of Systems   Review of Systems  Constitutional:  Negative for chills and fever.  Respiratory:  Negative for shortness of breath.   Cardiovascular:  Positive for chest pain. Negative for palpitations.  Gastrointestinal:  Negative for abdominal pain, nausea and vomiting.  Musculoskeletal:  Positive for arthralgias. Negative for back pain and neck pain.  Skin:  Negative for color change, pallor, rash  and wound.  Neurological:  Negative for dizziness, syncope, light-headedness and headaches.  Psychiatric/Behavioral:  Negative for confusion.    Physical Exam Updated Vital Signs BP 97/65 (BP Location: Left Arm)   Pulse 66   Temp 97.9 F (36.6 C) (Oral)   Resp 20   Ht 6\' 2"  (1.88 m)   Wt 81.6 kg   SpO2 92%   BMI 23.11 kg/m  Physical Exam Vitals and nursing note reviewed.  Constitutional:      General: He is not in acute distress.    Appearance: He is not ill-appearing, toxic-appearing or diaphoretic.  HENT:     Head: Normocephalic.  Eyes:     General: No scleral icterus.       Right  eye: No discharge.        Left eye: No discharge.  Cardiovascular:     Rate and Rhythm: Normal rate.     Pulses:          Radial pulses are 2+ on the right side and 2+ on the left side.  Pulmonary:     Effort: Pulmonary effort is normal. No tachypnea, bradypnea, accessory muscle usage or respiratory distress.     Breath sounds: Normal breath sounds.  Chest:     Chest wall: Deformity and tenderness present. No mass, lacerations, swelling, crepitus or edema.       Comments: Contusion and tenderness as indicated above. Musculoskeletal:     Right shoulder: No swelling, deformity, effusion, laceration, tenderness, bony tenderness or crepitus. Decreased range of motion.     Left shoulder: No swelling, deformity, effusion, laceration, tenderness, bony tenderness or crepitus. Normal range of motion.     Right upper arm: Normal.     Left upper arm: Normal.     Right elbow: Normal.     Left elbow: Normal.     Right forearm: Normal.     Left forearm: Normal.     Right wrist: Normal.     Left wrist: Normal.     Right hand: No swelling, deformity, lacerations, tenderness or bony tenderness. Normal range of motion. Normal sensation. Normal capillary refill.     Left hand: No swelling, deformity, lacerations, tenderness or bony tenderness. Normal range of motion. Normal sensation. Normal capillary refill.  Skin:    General: Skin is warm and dry.  Neurological:     General: No focal deficit present.     Mental Status: He is alert.  Psychiatric:        Behavior: Behavior is cooperative.    ED Results / Procedures / Treatments   Labs (all labs ordered are listed, but only abnormal results are displayed) Labs Reviewed - No data to display  EKG EKG Interpretation  Date/Time:  Saturday Mar 12 2022 01:36:21 EDT Ventricular Rate:  62 PR Interval:  159 QRS Duration: 98 QT Interval:  412 QTC Calculation: 419 R Axis:   85 Text Interpretation: Sinus rhythm Borderline right axis deviation  Probable anteroseptal infarct, old Confirmed by 07-15-1984 [phone]) on 03/12/2022 1:45:07 AM  Radiology DG Ribs Unilateral W/Chest Left  Result Date: 03/12/2022 CLINICAL DATA:  Tree limb fell on patient.  Left rib pain EXAM: LEFT RIBS AND CHEST - 3+ VIEW COMPARISON:  None Available. FINDINGS: There is hyperinflation of the lungs compatible with COPD. Heart is normal size. No confluent airspace opacities, effusions or pneumothorax. No visible rib fracture. IMPRESSION: COPD. No acute cardiopulmonary disease. No visible displaced rib fracture. Electronically Signed   By: 03/14/2022 M.D.  On: 03/12/2022 02:10   DG Shoulder Right  Result Date: 03/12/2022 CLINICAL DATA:  Right shoulder pain after using chainsaw. Tree limb fell on patient. EXAM: RIGHT SHOULDER - 2+ VIEW COMPARISON:  None Available. FINDINGS: Degenerative changes at the right [facility] joint with spurring. Glenohumeral joint is maintained. No acute bony abnormality. Specifically, no fracture, subluxation, or dislocation. IMPRESSION: No acute bony abnormality. Electronically Signed   By: Charlett NoseKevin  Dover M.D.   On: 03/12/2022 02:11    Procedures Procedures    Medications Ordered in ED Medications  lidocaine (LIDODERM) 5 % 1 patch (has no administration in time range)    ED Course/ Medical Decision Making/ A&P                           Medical Decision Making Amount and/or Complexity of Data Reviewed Radiology: ordered.  Risk Prescription drug management.   Alert 66 year old male in no acute distress, nontoxic-appearing.  Presents to the emergency department with a chief complaint of right shoulder and left chest wall pain.  Information obtained from patient.  I personally reviewed past medical records including previous provider notes, labs, and imaging.  Patient has medical history as outlined in HPI which complicates his care.  Patient has tenderness and contusion to left chest wall concern for possible rib fracture.  Will  obtain x-ray imaging as well as EKG.  We will give patient lidocaine patch.  We will hold any opiate pain medication as patient endorses marijuana and alcohol use tonight.  Will hold on Toradol as patient has used Advil recently.  Patient has decreased range of motion to right shoulder without any focal tenderness.  Will obtain x-ray imaging to look for acute osseous abnormality however I suspect that patient may have suffered a rotator cuff or ligamentous injury.  Patient will need to follow-up with orthopedic provider in the outpatient setting for further evaluation.  I personally viewed and interpreted patient's lab results.  Agree with radiology interpretation of no acute osseous abnormality to right shoulder.  No visible displaced rib fracture.  No acute cardiopulmonary disease.  I personally viewed interpret patient's EKG.  Tracing shows sinus rhythm.  We will place patient in shoulder sling due to reports of pain and decreased range of motion.  Patient will likely need MRI in the outpatient setting.  Will prescribe patient with lidocaine patch for his chest wall pain as this is likely musculoskeletal in nature.  Patient advised to use over-the-counter pain medication, and ice for further management of his pain.  Patient given resources for substance use counseling.  Based on patient's chief complaint, I considered admission might be necessary, however after reassuring ED workup feel patient is reasonable for discharge.  Discussed results, findings, treatment and follow up. Patient advised of return precautions. Patient verbalized understanding and agreed with plan.  Portions of this note were generated with Scientist, clinical (histocompatibility and immunogenetics)Dragon dictation software. Dictation errors may occur despite best attempts at proofreading.         Final Clinical Impression(s) / ED Diagnoses Final diagnoses:  None    Rx / DC Orders ED Discharge Orders     None         Berneice HeinrichBadalamente, Rhoderick Farrel R, PA-C 03/12/22 0217     Sabas SousBero, Michael M, MD 03/12/22 0730

## 2022-03-29 ENCOUNTER — Encounter (HOSPITAL_COMMUNITY): Payer: Self-pay

## 2022-03-29 ENCOUNTER — Emergency Department (HOSPITAL_COMMUNITY): Payer: Medicaid Other

## 2022-03-29 ENCOUNTER — Emergency Department (HOSPITAL_COMMUNITY)
Admission: EM | Admit: 2022-03-29 | Discharge: 2022-03-29 | Disposition: A | Discharge status: Home / Self Care | Payer: Medicaid Other | Attending: Emergency Medicine | Admitting: Emergency Medicine

## 2022-03-29 DIAGNOSIS — M25511 Pain in right shoulder: Secondary | ICD-10-CM | POA: Insufficient documentation

## 2022-03-29 DIAGNOSIS — Y9389 Activity, other specified: Secondary | ICD-10-CM | POA: Diagnosis not present

## 2022-03-29 DIAGNOSIS — W010XXA Fall on same level from slipping, tripping and stumbling without subsequent striking against object, initial encounter: Secondary | ICD-10-CM | POA: Diagnosis not present

## 2022-03-29 DIAGNOSIS — R10813 Right lower quadrant abdominal tenderness: Secondary | ICD-10-CM | POA: Diagnosis not present

## 2022-03-29 DIAGNOSIS — W19XXXA Unspecified fall, initial encounter: Secondary | ICD-10-CM

## 2022-03-29 DIAGNOSIS — Y999 Unspecified external cause status: Secondary | ICD-10-CM | POA: Diagnosis not present

## 2022-03-29 DIAGNOSIS — Y92008 Other place in unspecified non-institutional (private) residence as the place of occurrence of the external cause: Secondary | ICD-10-CM | POA: Insufficient documentation

## 2022-03-29 NOTE — ED Provider Notes (Signed)
[facility]-EMERGENCY DEPT Provider Note   CSN: [id number] Arrival date & time: 03/29/22  1326     History  Chief Complaint  Patient presents with   Fall   Shoulder Injury    Tim [name] is a 66 y.o. male who presents to the emergency department via EMS for a fall onset 2 days ago.  He notes that he was tripped by his dog.  Denies hitting his head, LOC.  No anticoagulant use at this time.  Has associated right shoulder pain.  No meds tried prior to arrival.  Denies nausea, vomiting.   Also complains of right inguinal feeling hard onset 2 weeks. However, notes that he has felt these symptoms since he had his hernia surgery. Denies fever, chills, abdominal pain. Denies any change in the sensation that he has been feeling since the surgery.   The history is provided by the patient. No language interpreter was used.       Home Medications Prior to Admission medications   Medication Sig Start Date End Date Taking? Authorizing Provider  escitalopram (LEXAPRO) 20 MG tablet Take 20 mg by mouth daily. Patient not taking: Reported on 03/12/2022 01/10/22   [provider]  hydrOXYzine (VISTARIL) 50 MG capsule Take 50 mg by mouth 3 (three) times daily as needed for anxiety. 01/10/22   [provider]  ibuprofen (ADVIL) 200 MG tablet Take 400 mg by mouth every 8 (eight) hours as needed for moderate pain.    [provider]  lidocaine (HM LIDOCAINE PATCH) 4 % Place 1 patch onto the skin every 12 (twelve) hours as needed. 03/12/22   Haskel Schroeder, PA-C  SYMBICORT 160-4.5 MCG/ACT inhaler 2 puffs 2 (two) times daily. 06/01/21   [provider]      Allergies    Patient has no known allergies.    Review of Systems   Review of Systems  Constitutional:  Negative for chills and fever.  Musculoskeletal:  Positive for arthralgias. Negative for joint swelling.  Skin:  Negative for color change, rash and wound.  All other systems  reviewed and are negative.   Physical Exam Updated Vital Signs BP 101/89 (BP Location: Left Arm)   Pulse 65   Temp 97.9 F (36.6 C) (Oral)   Resp 18   SpO2 99%  Physical Exam Vitals and nursing note reviewed. Exam conducted with a chaperone present.  Constitutional:      General: He is not in acute distress.    Appearance: Normal appearance.  Eyes:     General: No scleral icterus.    Extraocular Movements: Extraocular movements intact.  Cardiovascular:     Rate and Rhythm: Normal rate and regular rhythm.     Pulses: Normal pulses.     Heart sounds: Normal heart sounds.  Pulmonary:     Effort: Pulmonary effort is normal. No respiratory distress.     Breath sounds: Normal breath sounds.  Abdominal:     Hernia: There is no hernia in the left inguinal area or right inguinal area.  Genitourinary:    Comments: RN chaperone present for exam. No tenderness to palpation noted to bilateral inguinal region.  No appreciable lymphadenopathy noted to bilateral inguinal regions.  No overlying skin changes. Scarring noted to right inguinal canal.  Musculoskeletal:     Cervical back: Neck supple.     Comments: Mild tenderness to palpation to superior aspect of right upper arm. No obvious deformity, effusion, erythema, or swelling.  Decreased range of  motion of right shoulder secondary to pain.  Patient able to ambulate without difficulty or assistance.  No tenderness to palpation noted to spine.    Lymphadenopathy:     Lower Body: No right inguinal adenopathy. No left inguinal adenopathy.  Skin:    General: Skin is warm and dry.     Findings: No bruising, erythema or rash.  Neurological:     Mental Status: He is alert.  Psychiatric:        Behavior: Behavior normal.     ED Results / Procedures / Treatments   Labs (all labs ordered are listed, but only abnormal results are displayed) Labs Reviewed - No data to display  EKG None  Radiology DG Shoulder Right  Result Date:  03/29/2022 CLINICAL DATA:  Right shoulder pain after fall 2 days ago. EXAM: RIGHT SHOULDER - 2+ VIEW COMPARISON:  January 10, 2022. FINDINGS: There is no evidence of fracture or dislocation. Mild degenerative change is seen involving the right acromioclavicular joint. Soft tissues are unremarkable. 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 No acute abnormality is noted. Electronically Signed   By: Lupita Raider M.D.   On: 03/29/2022 15:08    Procedures Procedures    Medications Ordered in ED Medications - No data to display  ED Course/ Medical Decision Making/ A&P                           Medical Decision Making Amount and/or Complexity of Data Reviewed Radiology: ordered.   Patient with right shoulder pain onset 2 days ago after tripping over his dog.  Denies hitting his head, LOC, vomiting.  Vital signs stable, patient afebrile. On exam, patient with Mild tenderness to palpation to superior aspect of right upper arm. No obvious deformity, effusion, erythema, or swelling.  Decreased range of motion of right shoulder secondary to pain.  Patient able to ambulate without difficulty or assistance.  No tenderness to palpation noted to spine.  RN chaperone present for GU exam, no tenderness to palpation noted to bilateral inguinal region.  No appreciable lymphadenopathy noted to bilateral inguinal regions.  No overlying skin changes. No abdominal tenderness to palpation. Differential diagnosis includes fracture, dislocation, strain.  Differential diagnosis also includes incarcerated hernia, strangulated hernia, abscess, cellulitis.  Additional history obtained:  External records from outside source obtained and reviewed including: Pt was evaluated at his surgeons office on 11/29/21 for post-op follow up. At that time no acute findings on physical exam. Surgeon noted significant scarring along the cord.   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 No  acute abnormality is noted.  I agree with the radiologist interpretation   Disposition: Presentation suspicious for degenerative disease to right shoulder.  Also suspicious for acute right shoulder pain secondary to fall.  Doubt fracture or dislocation at this time.  Doubt hernia at this time, no surrounding erythema, no cellulitis, no abscess palpable to the area.  After consideration of the diagnostic results and the patients response to treatment, I feel that the patient would benefit from Discharge home.  Instructed the patient that he needs to follow-up with a sports medicine doctor.  Also discussed with patient he needs to follow-up with [facility] surgery regarding today's ED visit. Supportive care measures and strict return precautions discussed with patient at bedside. Pt acknowledges and verbalizes understanding.  Pt appears safe for discharge. Follow up as indicated in discharge paperwork.    This chart was dictated using voice recognition software, Dragon. Despite the best efforts of this provider to proofread and correct errors, errors may still occur which can change documentation meaning.  Final Clinical Impression(s) / ED Diagnoses Final diagnoses:  Fall, initial encounter  Acute pain of right shoulder    Rx / DC Orders ED Discharge Orders     None         Vincente Asbridge A, PA-C 03/29/22 1602    Linwood DibblesKnapp, Jon, MD 03/31/22 0700

## 2022-03-29 NOTE — ED Triage Notes (Signed)
Pt arrives via EMS for a fall from standing on Sunday. Pt states that he was tripped by his dog. Denies head injury/LOC. No blood thinners. Pt c/o R shoulder pain. He also states that he had hernia surgery recently and has mild discomfort in his R inguinal area.

## 2022-03-29 NOTE — Discharge Instructions (Addendum)
It was a pleasure taking care of you!   Your x-ray was negative for fracture or dislocation.  You may take over the counter 600 mg Ibuprofen every 6 hours or 500 mg Tylenol every 6 hours as needed for pain for no more than 7 days. You may apply ice to affected area for up to 15 minutes at a time. Ensure to place a barrier between your skin and the ice.  You may follow-up with your primary care provider as needed.  Call your surgeon in [facility] to set up a follow-up appointment during today's ED visit.  Attached is information for the on-call sports medicine doctor, call and set up a follow-up appointment during today's ED visit.  Return to the Emergency Department if you are experiencing increasing/worsening pain, swelling, color change, fever, or worsening symptoms.

## 2022-04-15 ENCOUNTER — Ambulatory Visit (HOSPITAL_COMMUNITY)
Admission: EM | Admit: 2022-04-15 | Discharge: 2022-04-16 | Disposition: A | Discharge status: Home / Self Care | Payer: Medicaid Other | Attending: Urology | Admitting: Urology

## 2022-04-15 DIAGNOSIS — J449 Chronic obstructive pulmonary disease, unspecified: Secondary | ICD-10-CM | POA: Insufficient documentation

## 2022-04-15 DIAGNOSIS — F1290 Cannabis use, unspecified, uncomplicated: Secondary | ICD-10-CM | POA: Insufficient documentation

## 2022-04-15 DIAGNOSIS — Z20822 Contact with and (suspected) exposure to covid-19: Secondary | ICD-10-CM | POA: Insufficient documentation

## 2022-04-15 DIAGNOSIS — Z85828 Personal history of other malignant neoplasm of skin: Secondary | ICD-10-CM | POA: Insufficient documentation

## 2022-04-15 DIAGNOSIS — E079 Disorder of thyroid, unspecified: Secondary | ICD-10-CM | POA: Insufficient documentation

## 2022-04-15 DIAGNOSIS — F419 Anxiety disorder, unspecified: Secondary | ICD-10-CM | POA: Insufficient documentation

## 2022-04-15 DIAGNOSIS — F10129 Alcohol abuse with intoxication, unspecified: Secondary | ICD-10-CM | POA: Insufficient documentation

## 2022-04-15 DIAGNOSIS — F17210 Nicotine dependence, cigarettes, uncomplicated: Secondary | ICD-10-CM | POA: Insufficient documentation

## 2022-04-15 DIAGNOSIS — Z79899 Other long term (current) drug therapy: Secondary | ICD-10-CM | POA: Insufficient documentation

## 2022-04-15 DIAGNOSIS — B182 Chronic viral hepatitis C: Secondary | ICD-10-CM | POA: Insufficient documentation

## 2022-04-15 LAB — POCT URINE DRUG SCREEN - MANUAL ENTRY (I-SCREEN)
POC Amphetamine UR: NOT DETECTED
POC Buprenorphine (BUP): NOT DETECTED
POC Cocaine UR: POSITIVE — AB
POC Marijuana UR: POSITIVE — AB
POC Methadone UR: NOT DETECTED
POC Methamphetamine UR: NOT DETECTED
POC Morphine: NOT DETECTED
POC Oxazepam (BZO): NOT DETECTED
POC Oxycodone UR: NOT DETECTED
POC Secobarbital (BAR): NOT DETECTED

## 2022-04-15 MED ORDER — THIAMINE HCL 100 MG PO TABS: 100.0000 mg | ORAL_TABLET | Freq: Every day | ORAL | Status: DC

## 2022-04-15 MED ORDER — THIAMINE HCL 100 MG/ML IJ SOLN
100.0000 mg | Freq: Once | INTRAMUSCULAR | Status: AC
  Administered 2022-04-16: 100 mg via INTRAMUSCULAR
  Filled 2022-04-15: qty 2

## 2022-04-15 MED ORDER — LOPERAMIDE HCL 2 MG PO CAPS: 2.0000 mg | ORAL_CAPSULE | ORAL | Status: DC | PRN

## 2022-04-15 MED ORDER — MAGNESIUM HYDROXIDE 400 MG/5ML PO SUSP
30.0000 mL | Freq: Every day | ORAL | Status: DC | PRN
Start: 2022-04-15 — End: 2022-04-16

## 2022-04-15 MED ORDER — ACETAMINOPHEN 325 MG PO TABS: 650.0000 mg | ORAL_TABLET | Freq: Four times a day (QID) | ORAL | Status: DC | PRN

## 2022-04-15 MED ORDER — LORAZEPAM 1 MG PO TABS: 1.0000 mg | ORAL_TABLET | Freq: Four times a day (QID) | ORAL | Status: DC | PRN

## 2022-04-15 MED ORDER — ONDANSETRON 4 MG PO TBDP: 4.0000 mg | ORAL_TABLET | Freq: Four times a day (QID) | ORAL | Status: DC | PRN

## 2022-04-15 MED ORDER — HYDROXYZINE HCL 25 MG PO TABS: 25.0000 mg | ORAL_TABLET | Freq: Four times a day (QID) | ORAL | Status: DC | PRN

## 2022-04-15 MED ORDER — ALUM & MAG HYDROXIDE-SIMETH 200-200-20 MG/5ML PO SUSP
30.0000 mL | ORAL | Status: DC | PRN
Start: 2022-04-15 — End: 2022-04-16

## 2022-04-15 MED ORDER — ADULT MULTIVITAMIN W/MINERALS CH
1.0000 | ORAL_TABLET | Freq: Every day | ORAL | Status: DC
Start: 2022-04-16 — End: 2022-04-16
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
## 2022-04-15 NOTE — ED Provider Notes (Signed)
[facility] Continuous Assessment Admission H&P  Date: 04/16/22 Patient Name: Tim [name] MRN: [id number] Chief Complaint:  Chief Complaint  Patient presents with   Alcohol Problem   Suicidal      Diagnoses:  Final diagnoses:  Alcohol abuse with intoxication [facility])    HPI: Tim [name] is a 66 year old male with history of abuse, anxiety and depression.  Patient presented voluntarily to [facility] for a walk-in assessment.   This nurse petitioner reviewed patient's chart and met with him face-to-face.  On assessment, patient is noted to be lying on the floor he is in no apparent distress.  He is oriented to self and place. Patient's speech is garbled and mostly incomprehensible, he appears to be intoxicated and he smells of alcohol. He says he needs assistance with his "mental health" but is unable to elaborate further what hep he needs. He is able to answer "no" when asked about suicidal and homicidal ideation. He denies hallucination. He says he drinks 12-14 cans of beers daily. He says his last alcoholic drink was today. He is unable to provide further details at this time due to intoxication.   PHQ 2-9:   [place] ED from 03/29/2022 in [place] [facility]PT ED from 03/12/2022 in [place] DEPT ED from 02/06/2022 in [place] DEPT  C-SSRS RISK CATEGORY No Risk No Risk No Risk        Total Time spent with patient: 15 minutes  Musculoskeletal  Strength & Muscle Tone: within normal limits Gait & Station: normal Patient leans: Right  Psychiatric Specialty Exam  Presentation General Appearance: Disheveled  Eye Contact:Fair  Speech:Garbled  Speech Volume:Normal  Handedness:Right   Mood and Affect  Mood:Euthymic  Affect:Congruent   Thought Process  Thought Processes:-- (unable to assess)  Descriptions of Associations:Loose  Orientation:Partial  Thought  Content:Logical  Diagnosis of Schizophrenia or Schizoaffective disorder in past: No   Hallucinations:Hallucinations: None  Ideas of Reference:None  Suicidal Thoughts:Suicidal Thoughts: No  Homicidal Thoughts:Homicidal Thoughts: No   Sensorium  Memory:Immediate Good; Recent Good; Remote Good  Judgment:Impaired  Insight:Fair   Executive Functions  Concentration:Poor  Attention Span:Poor  Recall:Poor  Fund of Knowledge:Poor  Language:Poor   Psychomotor Activity  Psychomotor Activity:Psychomotor Activity: Normal   Assets  Assets:Desire for Improvement   Sleep  Sleep:Sleep: -- (patient unable to answer, he is intoxicated)   Nutritional Assessment (For OBS and FBC admissions only) Has the patient had a weight loss or gain of 10 pounds or more in the last 3 months?: -- (patient unable to answer, he is intoxicated) Has the patient had a decrease in food intake/or appetite?: -- (patient unable to answer, he is intoxicated) Does the patient have dental problems?: -- (patient unable to answer, he is intoxicated) Does the patient have eating habits or behaviors that may be indicators of an eating disorder including binging or inducing vomiting?: -- (patient unable to answer, he is intoxicated) Has the patient recently lost weight without trying?: -- (patient unable to answer, he is intoxicated) Has the patient been eating poorly because of a decreased appetite?: -- (patient unable to answer, he is intoxicated)    Physical Exam Vitals and nursing note reviewed.  Constitutional:      General: He is not in acute distress.    Appearance: He is well-developed. He is not ill-appearing.  HENT:     Head: Normocephalic and atraumatic.  Eyes:     Conjunctiva/sclera: Conjunctivae normal.  Cardiovascular:     Rate and Rhythm: Normal rate.  Pulmonary:     Effort: Pulmonary effort is normal. No respiratory distress.     Breath sounds: Normal breath sounds.  Abdominal:      Palpations: Abdomen is soft.     Tenderness: There is no abdominal tenderness.  Musculoskeletal:        General: No swelling.     Cervical back: Neck supple.  Skin:    General: Skin is warm and dry.  Neurological:     Mental Status: He is alert. He is disoriented.  Psychiatric:        Attention and Perception: Attention and perception normal.        Mood and Affect: Mood normal.        Speech: Speech is delayed and slurred.        Behavior: Behavior is cooperative.        Thought Content: Thought content normal.        Cognition and Memory: Memory is impaired (patient is currently intoxicated).    Review of Systems  Constitutional: Negative.   HENT: Negative.    Eyes: Negative.   Respiratory: Negative.    Cardiovascular: Negative.   Gastrointestinal: Negative.   Genitourinary: Negative.   Musculoskeletal: Negative.   Skin: Negative.   Neurological: Negative.   Endo/Heme/Allergies: Negative.   Psychiatric/Behavioral:  Positive for substance abuse.     Blood pressure (!) 102/58, pulse 62, temperature 97.8 F (36.6 C), temperature source Oral, resp. rate 18, SpO2 100 %. There is no height or weight on file to calculate BMI.  Past Psychiatric History: alcohol abuse,  depression, anxiety  Is the patient at risk to self? No  Has the patient been a risk to self in the past 6 months? No .    Has the patient been a risk to self within the distant past? No   Is the patient a risk to others? No   Has the patient been a risk to others in the past 6 months? No   Has the patient been a risk to others within the distant past? No   Past Medical History:  Past Medical History:  Diagnosis Date   Alcoholism ([place])    Anxiety    COPD (chronic obstructive pulmonary disease) ([place])    Depression    Hep C w/o coma, chronic ([place])    History of hiatal hernia    hernia rt groin   Mental health disorder    Scoliosis    Thyroid disease     Past Surgical History:  Procedure Laterality  Date   INGUINAL HERNIA REPAIR Right 10/09/2021   Procedure: HERNIA REPAIR INGUINAL INCARCERATED;  Surgeon: Erroll Luna, MD;  Location: [facility];  Service: General;  Laterality: Right;   SKIN CANCER EXCISION     THYROID SURGERY      Family History:  Family History  Problem Relation Age of Onset   Dementia Mother     Social History:  Social History   Socioeconomic History   Marital status: Single    Spouse name: Not on file   Number of children: Not on file   Years of education: Not on file   Highest education level: Not on file  Occupational History   Not on file  Tobacco Use   Smoking status: Every Day    Packs/day: 0.50    Years: 40.00    Total pack years: 20.00    Types: Cigarettes   Smokeless tobacco: Never  Tobacco comments:    Patient refused  Vaping Use   Vaping Use: Never used  Substance and Sexual Activity   Alcohol use: Yes   Drug use: Yes    Types: Marijuana   Sexual activity: Never    Birth control/protection: None  Other Topics Concern   Not on file  Social History Narrative   ** Merged History Encounter **       Social Determinants of Health   Financial Resource Strain: Not on file  Food Insecurity: Not on file  Transportation Needs: Not on file  Physical Activity: Not on file  Stress: Not on file  Social Connections: Not on file  Intimate Partner Violence: Not on file    SDOH:  SDOH Screenings   Alcohol Screen: Medium Risk (02/14/2020)   Alcohol Screen    Last Alcohol Screening Score (AUDIT): 26  Depression (PHQ2-9): Low Risk  (09/01/2021)   Depression (PHQ2-9)    PHQ-2 Score: 0  Financial Resource Strain: Not on file  Food Insecurity: Not on file  Housing: Not on file  Physical Activity: Not on file  Social Connections: Not on file  Stress: Not on file  Tobacco Use: High Risk (03/29/2022)   Patient History    Smoking Tobacco Use: Every Day    Smokeless Tobacco Use: Never    Passive Exposure: Not on file  Transportation  Needs: Not on file    Last Labs:  Admission on 04/15/2022  Component Date Value Ref Range Status   POC Amphetamine UR 04/15/2022 None Detected  NONE DETECTED (Cut Off Level 1000 ng/mL) Final   POC Secobarbital (BAR) 04/15/2022 None Detected  NONE DETECTED (Cut Off Level 300 ng/mL) Final   POC Buprenorphine (BUP) 04/15/2022 None Detected  NONE DETECTED (Cut Off Level 10 ng/mL) Final   POC Oxazepam (BZO) 04/15/2022 None Detected  NONE DETECTED (Cut Off Level 300 ng/mL) Final   POC Cocaine UR 04/15/2022 Positive (A)  NONE DETECTED (Cut Off Level 300 ng/mL) Final   POC Methamphetamine UR 04/15/2022 None Detected  NONE DETECTED (Cut Off Level 1000 ng/mL) Final   POC Morphine 04/15/2022 None Detected  NONE DETECTED (Cut Off Level 300 ng/mL) Final   POC Methadone UR 04/15/2022 None Detected  NONE DETECTED (Cut Off Level 300 ng/mL) Final   POC Oxycodone UR 04/15/2022 None Detected  NONE DETECTED (Cut Off Level 100 ng/mL) Final   POC Marijuana UR 04/15/2022 Positive (A)  NONE DETECTED (Cut Off Level 50 ng/mL) Final   SARSCOV2ONAVIRUS 2 AG 04/16/2022 NEGATIVE  NEGATIVE Final   Comment: (NOTE) SARS-CoV-2 antigen NOT DETECTED.   Negative results are presumptive.  Negative results do not preclude SARS-CoV-2 infection and should not be used as the sole basis for treatment or other patient management decisions, including infection  control decisions, particularly in the presence of clinical signs and  symptoms consistent with COVID-19, or in those who have been in contact with the virus.  Negative results must be combined with clinical observations, patient history, and epidemiological information. The expected result is Negative.  Fact Sheet for Patients: HandmadeRecipes.com.cy  Fact Sheet for Healthcare Providers: FuneralLife.at  This test is not yet approved or cleared by the [place] FDA and  has been authorized for detection and/or diagnosis  of SARS-CoV-2 by FDA under an Emergency Use Authorization (EUA).  This EUA will remain in effect (meaning this test can be used) for the duration of  the COV  ID-19 declaration under Section 564(b)(1) of the Act, 21 U.S.C. section 360bbb-3(b)(1), unless the authorization is terminated or revoked sooner.     SARSCOV2ONAVIRUS 2 AG 04/16/2022 NEGATIVE  NEGATIVE Final   Comment: (NOTE) SARS-CoV-2 antigen NOT DETECTED.   Negative results are presumptive.  Negative results do not preclude SARS-CoV-2 infection and should not be used as the sole basis for treatment or other patient management decisions, including infection  control decisions, particularly in the presence of clinical signs and  symptoms consistent with COVID-19, or in those who have been in contact with the virus.  Negative results must be combined with clinical observations, patient history, and epidemiological information. The expected result is Negative.  Fact Sheet for Patients: HandmadeRecipes.com.cy  Fact Sheet for Healthcare Providers: FuneralLife.at  This test is not yet approved or cleared by the [place] FDA and  has been authorized for detection and/or diagnosis of SARS-CoV-2 by FDA under an Emergency Use Authorization (EUA).  This EUA will remain in effect (meaning this test can be used) for the duration of  the COV                          ID-19 declaration under Section 564(b)(1) of the Act, 21 U.S.C. section 360bbb-3(b)(1), unless the authorization is terminated or revoked sooner.    Admission on 11/10/2021, Discharged on 11/10/2021  Component Date Value Ref Range Status   Sodium 11/10/2021 136  135 - 145 mmol/L Final   Potassium 11/10/2021 4.3  3.5 - 5.1 mmol/L Final   Chloride 11/10/2021 106  98 - 111 mmol/L Final   CO2 11/10/2021 24  22 - 32 mmol/L Final   Glucose, Bld 11/10/2021 92  70 - 99 mg/dL Final   Glucose reference  range applies only to samples taken after fasting for at least 8 hours.   BUN 11/10/2021 16  8 - 23 mg/dL Final   Creatinine, Ser 11/10/2021 0.93  0.61 - 1.24 mg/dL Final   Calcium 11/10/2021 9.9  8.9 - 10.3 mg/dL Final   GFR, Estimated 11/10/2021 >60  >60 mL/min Final   Comment: (NOTE) Calculated using the CKD-EPI Creatinine Equation (2021)    Anion gap 11/10/2021 6  5 - 15 Final   Performed at [facility], [place] [address] [place], [place] [address]   WBC 11/10/2021 5.7  4.0 - 10.5 K/uL Final   RBC 11/10/2021 4.78  4.22 - 5.81 MIL/uL Final   Hemoglobin 11/10/2021 15.5  13.0 - 17.0 g/dL Final   HCT 11/10/2021 43.5  39.0 - 52.0 % Final   MCV 11/10/2021 91.0  80.0 - 100.0 fL Final   MCH 11/10/2021 32.4  26.0 - 34.0 pg Final   MCHC 11/10/2021 35.6  30.0 - 36.0 g/dL Final   RDW 11/10/2021 13.2  11.5 - 15.5 % Final   Platelets 11/10/2021 167  150 - 400 K/uL Final   nRBC 11/10/2021 0.0  0.0 - 0.2 % Final   Neutrophils Relative % 11/10/2021 67  % Final   Neutro Abs 11/10/2021 3.8  1.7 - 7.7 K/uL Final   Lymphocytes Relative 11/10/2021 21  % Final   Lymphs Abs 11/10/2021 1.2  0.7 - 4.0 K/uL Final   Monocytes Relative 11/10/2021 7  % Final   Monocytes Absolute 11/10/2021 0.4  0.1 - 1.0 K/uL Final   Eosinophils Relative 11/10/2021 4  % Final   Eosinophils Absolute 11/10/2021 0.2  0.0 - 0.5 K/uL Final   Basophils Relative  11/10/2021 1  % Final   Basophils Absolute 11/10/2021 0.1  0.0 - 0.1 K/uL Final   Immature Granulocytes 11/10/2021 0  % Final   Abs Immature Granulocytes 11/10/2021 0.01  0.00 - 0.07 K/uL Final   Performed at [facility], [place] [address], [place], [place] [address]    Allergies: Patient has no known allergies.  PTA Medications: (Not in a hospital admission)   Medical Decision Making  Patient is actively intoxicated and requesting assistance with his mental health. He is unable to unable to fully participate is assessment. He will  be admitted to [facility] for continuous assessment with follow up by psychiatric on 04/16/22. -CIWA protocol initiated  Lab Orders         Resp Panel by RT-PCR (Flu A&B, Covid) Anterior Nasal Swab         CBC with Differential/Platelet         Comprehensive metabolic panel         Hemoglobin A1c         Ethanol         TSH         POCT Urine Drug Screen - (I-Screen)         POC SARS Coronavirus 2 Ag         POC SARS Coronavirus 2 Ag    -will verify home medication when patient is sober     Recommendations  Based on my evaluation the patient does not appear to have an emergency medical condition.  Ophelia Shoulder, NP 04/16/22  12:30 AM

## 2022-04-15 NOTE — Progress Notes (Signed)
   04/15/22 2230  [facility] Triage Screening (Walk-ins at Tim [name] only)  How Did You Hear About [place]? Self  What Is the Reason for Your Visit/Call Today? Tim [name] is a 66 year old male presenting voluntary to Tim [name] due to Tim [name] with no plan and intoxication. Patient denied HI. Patient is intoxicated. Patient is observed laying on floor in assessment room. Patient speech is slurred. TTS clinician is unable to understand at times what patient is trying to say. Patient continues to lay on floor. At times, it appears that patient is rambling. Patient admitted to Tim [name] with no plan. Patient reported last alcohol drink was prior to arrival. Patient reported "I can't make shit happen". Patient reported seeing someone for psychiatry and that he is prescribed Vistaril. Patient reported worsening depressive symptoms. No additional information due to patients current presentation. Patient is urgent.  How Long Has This Been Causing You Problems?  Tim [name])  Have You Recently Had Any Thoughts About Hurting Yourself? Yes  Are You Planning to Commit Suicide/Harm Yourself At This time? No  Have you Recently Had Thoughts About Hurting Someone Tim [name]? No  Are You Planning To Harm Someone At This Time? No  Are you currently experiencing any auditory, visual or other hallucinations? Yes  Please explain the hallucinations you are currently experiencing: visual, denied command voices, patient unable to explain at this time due to intoxication  Have You Used Any Alcohol or Drugs in the Past 24 Hours? Yes  How long ago did you use Drugs or Alcohol? prior to arrival  What Did You Use and How Much? alcohol  Do you have any current medical co-morbidities that require immediate attention?  Tim [name])  Clinician description of patient physical appearance/behavior: disheveled / intoxicated  What Do You Feel Would Help You the Most Today? Alcohol or Drug Use Treatment;Treatment for Depression or other mood  problem  If access to Tim [name] was not available, would you have sought [name] in the Emergency Department? Yes  Determination of Need Urgent (48 hours)  Options For Referral Medication Management;Outpatient Therapy;Facility-Based Crisis

## 2022-04-16 ENCOUNTER — Encounter (HOSPITAL_COMMUNITY): Payer: Self-pay | Admitting: Emergency Medicine

## 2022-04-16 ENCOUNTER — Other Ambulatory Visit: Payer: Self-pay

## 2022-04-16 LAB — COMPREHENSIVE METABOLIC PANEL
ALT: 19 U/L (ref 0–44)
AST: 22 U/L (ref 15–41)
Albumin: 4.1 g/dL (ref 3.5–5.0)
Alkaline Phosphatase: 61 U/L (ref 38–126)
Anion gap: 6 (ref 5–15)
BUN: 12 mg/dL (ref 8–23)
CO2: 27 mmol/L (ref 22–32)
Calcium: 10.2 mg/dL (ref 8.9–10.3)
Chloride: 104 mmol/L (ref 98–111)
Creatinine, Ser: 1.08 mg/dL (ref 0.61–1.24)
GFR, Estimated: >60 mL/min (ref >60)
Glucose, Bld: 94 mg/dL (ref 70–99)
Potassium: 4.5 mmol/L (ref 3.5–5.1)
Sodium: 137 mmol/L (ref 135–145)
Total Bilirubin: 0.7 mg/dL (ref 0.3–1.2)
Total Protein: 6.4 g/dL — ABNORMAL LOW (ref 6.5–8.1)

## 2022-04-16 LAB — CBC WITH DIFFERENTIAL/PLATELET
Abs Immature Granulocytes: 0.03 10*3/uL (ref 0.00–0.07)
Basophils Absolute: 0.1 10*3/uL (ref 0.0–0.1)
Basophils Relative: 2 %
Eosinophils Absolute: 0.3 10*3/uL (ref 0.0–0.5)
Eosinophils Relative: 5 %
HCT: 44.7 % (ref 39.0–52.0)
Hemoglobin: 15.2 g/dL (ref 13.0–17.0)
Immature Granulocytes: 1 %
Lymphocytes Relative: 31 %
Lymphs Abs: 1.7 10*3/uL (ref 0.7–4.0)
MCH: 30.6 pg (ref 26.0–34.0)
MCHC: 34 g/dL (ref 30.0–36.0)
MCV: 90.1 fL (ref 80.0–100.0)
Monocytes Absolute: 0.5 10*3/uL (ref 0.1–1.0)
Monocytes Relative: 10 %
Neutro Abs: 2.7 10*3/uL (ref 1.7–7.7)
Neutrophils Relative %: 51 %
Platelets: 190 10*3/uL (ref 150–400)
RBC: 4.96 MIL/uL (ref 4.22–5.81)
RDW: 12.7 % (ref 11.5–15.5)
WBC: 5.3 10*3/uL (ref 4.0–10.5)
nRBC: 0 % (ref 0.0–0.2)

## 2022-04-16 LAB — TSH: TSH: 2.894 u[IU]/mL (ref 0.350–4.500)

## 2022-04-16 LAB — RESP PANEL BY RT-PCR (FLU A&B, COVID) ARPGX2
Influenza A by PCR: NEGATIVE
Influenza B by PCR: NEGATIVE
SARS Coronavirus 2 by RT PCR: NEGATIVE

## 2022-04-16 LAB — HEMOGLOBIN A1C
Hgb A1c MFr Bld: 5.6 % (ref 4.8–5.6)
Mean Plasma Glucose: 114.02 mg/dL

## 2022-04-16 LAB — POC SARS CORONAVIRUS 2 AG
SARSCOV2ONAVIRUS 2 AG: NEGATIVE
SARSCOV2ONAVIRUS 2 AG: NEGATIVE

## 2022-04-16 LAB — ETHANOL: Alcohol, Ethyl (B): 201 mg/dL — ABNORMAL HIGH (ref <10)

## 2022-04-16 NOTE — Progress Notes (Signed)
Order received for pt discharge. AVS given and reviewed with patient at length. Pt denies any SI or HI or acute concerns. Cab voucher provided and pt belongings returned, escorted from unit to cab.

## 2022-04-16 NOTE — ED Notes (Signed)
[facility] called for pickup spoke with Amanda  

## 2022-04-16 NOTE — ED Notes (Signed)
Pt, intoxicated, presents with SI, no plan noted and worsening depression.  Denies HI or AVH.  Monitoring for safety.

## 2022-04-16 NOTE — ED Provider Notes (Cosign Needed Addendum)
FBC/OBS ASAP Discharge Summary  Date and Time: 04/16/2022 8:31 AM  Name: Tim [name]  MRN:  [id number]   Discharge Diagnoses:  Final diagnoses:  Alcohol abuse with intoxication [facility])    Subjective: Tim [name] 66 y.o., male patient who initially presented to the [facility] on 04/15/2022 intoxicated and was admitted to the continuous assessment unit for overnight observation.  Tim [name], 66 y.o., male patient seen face to face by this provider, consulted with Dr. Gasper Sells; and chart reviewed on 04/16/22.  Per chart review patient has [name] history of alcohol use disorder and MDD.  He has had multiple encounters with the area emergency rooms related to alcohol intoxication.  On admission EtOH 201.  UDS positive for cocaine and marijuana.  During evaluation Tim [name] reports he is feeling better and he is ready to be discharged.  States he has [name] 24 year old dog that he needs to get back home to because he lives alone.  Reports he drank too much of bourbon yesterday and he became intoxicated. He initially presented wanting substance abuse treatment but now states he is not ready.  Discussed  treatment options including outpatient, residential, and SA IOP.  Continues to state he is not interested in treatment at this time but he welcomes the resources.  On assessment he is laying in his bed in no acute distress.  He is alert/oriented x4 and cooperative.  He is disheveled and makes good eye contact.  His speech is clear, coherent, with [name] normal rate and tone.  He appears anxious.  Reports he feels depressed at times but mostly due to his alcohol use.  He denies any concerns with appetite or sleep.  He denies SI/HI/AVH.  He contracts for safety.  He denies any access to firearms/weapons.  Objectively he does not appear to be responding to internal/external stimuli.  He denies paranoia and delusional thought content.  Patient is able to converse coherently and answer questions appropriately.  He  is denying any withdrawal symptoms at this time.  Stay Summary:   Tim [name] was admitted to [facility] Continuous Assessment unit due to intoxication.  Patient slept while on the unit.  He was appropriate with staff and exhibited no unsafe behaviors. His emotional and mental status was monitored by staff. He was offered further treatment options upon discharge including but not limited to Residential, Intensive Outpatient, and outpatient treatment. Tim [name] will follow up with the services as listed below under Follow up Information.     Upon completion of this admission the Tim [name] was both mentally and medically stable for discharge denying suicidal/homicidal ideation, auditory/visual/tactile hallucinations, delusional thoughts and paranoia.     Total Time spent with patient: 30 minutes  Past Psychiatric History: See H&P Past Medical History:  Past Medical History:  Diagnosis Date   Alcoholism (HCC)    Anxiety    COPD (chronic obstructive pulmonary disease) (HCC)    Depression    Hep C w/o coma, chronic (HCC)    History of hiatal hernia    hernia rt groin   Mental health disorder    Scoliosis    Thyroid disease     Past Surgical History:  Procedure Laterality Date   INGUINAL HERNIA REPAIR Right 10/09/2021   Procedure: HERNIA REPAIR INGUINAL INCARCERATED;  Surgeon: Harriette Bouillon, MD;  Location: [facility];  Service: General;  Laterality: Right;   SKIN CANCER EXCISION     THYROID SURGERY     Family  History:  Family History  Problem Relation Age of Onset   Dementia Mother    Family Psychiatric History: See H&P Social History:  Social History   Substance and Sexual Activity  Alcohol Use Yes     Social History   Substance and Sexual Activity  Drug Use Yes   Types: Marijuana    Social History   Socioeconomic History   Marital status: Single    Spouse name: Not on file   Number of children: Not on file   Years of education: Not on file   Highest  education level: Not on file  Occupational History   Not on file  Tobacco Use   Smoking status: Every Day    Packs/day: 0.50    Years: 40.00    Total pack years: 20.00    Types: Cigarettes   Smokeless tobacco: Never   Tobacco comments:    Patient refused  Vaping Use   Vaping Use: Never used  Substance and Sexual Activity   Alcohol use: Yes   Drug use: Yes    Types: Marijuana   Sexual activity: Never    Birth control/protection: None  Other Topics Concern   Not on file  Social History Narrative   ** Merged History Encounter **       Social Determinants of Health   Financial Resource Strain: Not on file  Food Insecurity: Not on file  Transportation Needs: Not on file  Physical Activity: Not on file  Stress: Not on file  Social Connections: Not on file   SDOH:  SDOH Screenings   Alcohol Screen: Medium Risk (02/14/2020)   Alcohol Screen    Last Alcohol Screening Score (AUDIT): 26  Depression (PHQ2-9): Low Risk  (09/01/2021)   Depression (PHQ2-9)    PHQ-2 Score: 0  Financial Resource Strain: Not on file  Food Insecurity: Not on file  Housing: Not on file  Physical Activity: Not on file  Social Connections: Not on file  Stress: Not on file  Tobacco Use: High Risk (04/16/2022)   Patient History    Smoking Tobacco Use: Every Day    Smokeless Tobacco Use: Never    Passive Exposure: Not on file  Transportation Needs: Not on file    Tobacco Cessation:  N/[name], patient does not currently use tobacco products  Current Medications:  Current Facility-Administered Medications  Medication Dose Route Frequency Provider Last Rate Last Admin   acetaminophen (TYLENOL) tablet 650 mg  650 mg Oral Q6H PRN Tim [name], Tim [name], Tim [name]       alum & mag hydroxide-simeth (MAALOX/MYLANTA) 200-200-20 MG/5ML suspension 30 mL  30 mL Oral Q4H PRN Tim [name], Tim [name], Tim [name]       hydrOXYzine (ATARAX) tablet 25 mg  25 mg Oral Q6H PRN Tim [name], Tim [name], Tim [name]       loperamide (IMODIUM) capsule 2-4 mg  2-4 mg Oral  PRN Tim [name], Tim [name], Tim [name]       LORazepam (ATIVAN) tablet 1 mg  1 mg Oral Q6H PRN Tim [name], Tim [name], Tim [name]       magnesium hydroxide (MILK OF MAGNESIA) suspension 30 mL  30 mL Oral Daily PRN Tim [name], Tim [name], Tim [name]       multivitamin with minerals tablet 1 tablet  1 tablet Oral Daily Tim [name], Tim [name], Tim [name]       ondansetron (ZOFRAN-ODT) disintegrating tablet 4 mg  4 mg Oral Q6H PRN Tim [name], Tim [name], Tim [name]       thiamine tablet 100 mg  100 mg Oral Daily  Tim Asper [name], Tim [name]       Current Outpatient Medications  Medication Sig Dispense Refill   escitalopram (LEXAPRO) 20 MG tablet Take 20 mg by mouth daily. (Patient not taking: Reported on 03/12/2022)     hydrOXYzine (VISTARIL) 50 MG capsule Take 50 mg by mouth 3 (three) times daily as needed for anxiety.     ibuprofen (ADVIL) 200 MG tablet Take 400 mg by mouth every 8 (eight) hours as needed for moderate pain.     lidocaine (HM LIDOCAINE PATCH) 4 % Place 1 patch onto the skin every 12 (twelve) hours as needed. 30 patch 0   SYMBICORT 160-4.5 MCG/ACT inhaler 2 puffs 2 (two) times daily.      PTA Medications: (Not in [name] hospital admission)      09/01/2021    6:34 PM  Depression screen PHQ 2/9  Decreased Interest 0  Down, Depressed, Hopeless 0  PHQ - 2 Score 0    Flowsheet Row ED from 04/15/2022 in [facility] ED from 03/29/2022 in [facility] from 03/12/2022 in [facility]  C-SSRS RISK CATEGORY High Risk No Risk No Risk       Musculoskeletal  Strength & Muscle Tone: within normal limits Gait & Station: normal Patient leans: N/[name]  Psychiatric Specialty Exam  Presentation  General Appearance: Appropriate for Environment; Casual  Eye Contact:Fair  Speech:Clear and Coherent  Speech Volume:Normal  Handedness:Right   Mood and Affect  Mood:Anxious  Affect:Congruent   Thought Process  Thought Processes:Coherent  Descriptions of  Associations:Intact  Orientation:Full (Time, Place and Person)  Thought Content:Logical  Diagnosis of Schizophrenia or Schizoaffective disorder in past: No    Hallucinations:Hallucinations: None  Ideas of Reference:None  Suicidal Thoughts:Suicidal Thoughts: No  Homicidal Thoughts:Homicidal Thoughts: No   Sensorium  Memory:Immediate Good; Recent Good; Remote Good  Judgment:Impaired  Insight:Good   Executive Functions  Concentration:Good  Attention Span:Good  Recall:Good  Fund of Knowledge:Good  Language:Good   Psychomotor Activity  Psychomotor Activity:Psychomotor Activity: Normal   Assets  Assets:Communication Skills; Desire for Improvement; Financial Resources/Insurance; Housing; Physical Health; Resilience; Social Support   Sleep  Sleep:Sleep: Fair   Nutritional Assessment (For OBS and FBC admissions only) Has the patient had [name] weight loss or gain of 10 pounds or more in the last 3 months?: -- (patient unable to answer, he is intoxicated) Has the patient had [name] decrease in food intake/or appetite?: -- (patient unable to answer, he is intoxicated) Does the patient have dental problems?: -- (patient unable to answer, he is intoxicated) Does the patient have eating habits or behaviors that may be indicators of an eating disorder including binging or inducing vomiting?: -- (patient unable to answer, he is intoxicated) Has the patient recently lost weight without trying?: -- (patient unable to answer, he is intoxicated) Has the patient been eating poorly because of [name] decreased appetite?: -- (patient unable to answer, he is intoxicated)    Physical Exam  Physical Exam Vitals and nursing note reviewed.  Constitutional:      General: He is not in acute distress.    Appearance: He is well-developed.  HENT:     Head: Normocephalic and atraumatic.  Eyes:     General:        Right eye: No discharge.        Left eye: No discharge.     Conjunctiva/sclera:  Conjunctivae normal.  Cardiovascular:     Rate and Rhythm: Normal rate.  Heart sounds: No murmur heard. Pulmonary:     Effort: Pulmonary effort is normal. No respiratory distress.  Musculoskeletal:        General: Normal range of motion.     Cervical back: Normal range of motion.  Skin:    Coloration: Skin is not jaundiced or pale.  Neurological:     Mental Status: He is alert and oriented to person, place, and time.  Psychiatric:        Attention and Perception: Attention and perception normal.        Mood and Affect: Mood is anxious.        Speech: Speech normal.        Behavior: Behavior normal. Behavior is cooperative.        Thought Content: Thought content normal.        Cognition and Memory: Cognition normal.        Judgment: Judgment normal.    Review of Systems  Constitutional: Negative.   HENT: Negative.    Eyes: Negative.   Respiratory: Negative.    Cardiovascular: Negative.   Musculoskeletal: Negative.   Skin: Negative.   Neurological: Negative.   Psychiatric/Behavioral:  The patient is nervous/anxious.    Blood pressure 108/73, pulse 60, temperature 97.7 F (36.5 C), temperature source Oral, resp. rate 18, SpO2 96 %. There is no height or weight on file to calculate BMI.  Demographic Factors:  Male, Low socioeconomic status, Living alone, and Unemployed  Loss Factors: Financial problems/change in socioeconomic status  Historical Factors: Impulsivity  Risk Reduction Factors:   Sense of responsibility to family, Positive social support, Positive therapeutic relationship, and Positive coping skills or problem solving skills  Continued Clinical Symptoms:  Severe Anxiety and/or Agitation Alcohol/Substance Abuse/Dependencies  Cognitive Features That Contribute To Risk:  None    Suicide Risk:  Minimal: No identifiable suicidal ideation.  Patients presenting with no risk factors but with morbid ruminations; may be classified as minimal risk based on  the severity of the depressive symptoms  Plan Of Care/Follow-up recommendations:  Activity:  as tolerated  Diet:  regular   Disposition:   Discharge patient  Provided outpatient psychiatric resources including medication management, therapeutic services, and substance abuse treatment.  No evidence of imminent risk to self or others at present.    Patient does not meet criteria for psychiatric inpatient admission. Discussed crisis plan, support from social network, calling 911, coming to the Emergency Department, and calling Suicide Hotline.   Tim Hughs, Tim [name] 04/16/2022, 8:31 AM

## 2022-04-16 NOTE — ED Notes (Signed)
Pt sleeping at present, no distress noted. Respirations even & unlabored.  Monitoring for safety. 

## 2022-04-16 NOTE — ED Notes (Signed)
Pt presents with depressed mood, affect congruent. Patient is calm, stating '' I'm fine. I slept okay. '' Patient denies any SI or HI or A/V Hallucinations. No signs of altered sensorium or acute withdrawal. Patient states he would like to go home so he can tend to his dog. Pt is safe.

## 2022-04-16 NOTE — Discharge Instructions (Addendum)
Substance Abuse Treatment Programs ° °Intensive Outpatient Programs °[facility]     °[address]      °[place], [place]                   °[phone]      ° °[facility] °[address] °[place], [place] °[phone] ° °[facility]     °(Inpatient and outpatient)     °[address]           °[phone]   ° °[facility] °[phone] (Suboxone and Methadone) ° °[address]      °[place], [place] [address]      °336-882-2125      ° °[address] °[place], [place] °852-3033 ° °[facility] (Outpatient/Inpatient, [facility])    °(insurance only) [phone]      °       °[facility] (Groups & Residential) °[place], [place] °336-389-1413 ° °   °[facility]     °[address]     °[place], [place]      °[phone]      ° °[facility] (for caregivers and family) °[address] °[place], [place] °336-299-4655 ° ° ° ° ° °Residential Treatment Programs °[facility]      °[address], [place], [place] [address]  °([phone]      ° °[facility] °[address] [place], [place] [address] °[phone] ° °[facility]        °336-248-8914      ° °[facility] °1-800-659-3381 ° °[facility]             °[address]                                         °[place], [place]                                                °877-615-2722 or 336-784-9470                              ° °[facility] [place] °[address] °[place], [address] °[phone] ° °[facility]    °[address]      °[place], [place]     °[phone]      ° °[facility] °[address] °[place], [place] °336-285-9073 ° °[facility]   °[address]     °[place], [place] [address]     °[phone]      °Admissions: 8am-3pm M-F ° °[facility] °[address] °[place],  [place] °336-227-7417 ° °[facility]: Residential Program (90 Days)   °[place],       °336-725-8389 or 800-758-6077    ° °[facility] °[place],  °(Walk in Hours over the weekend or by referral) ° °[facility] °[address], [place],  [address] °([phone] ° °Crisis Mobile: [facility]:  1-877-626-1772 (for crisis response 24 hours a day) °[facility] Hotline:      1-800-256-2452 °Outpatient Psychiatry and Counseling ° °[facility]: Mobile Crisis   Management 24 hours:  1-877-626-1772 ° °[facility] sliding scale fee and walk in schedule: M-F 8am-12pm/1pm-3pm °[address]  °[place], [place] [address] °[phone] ° °[facility] °[address] °[place], [place] [address] °336-703-9650 ° °[facility] ([facility] known [facility])- new patient walk-in appointments available Monday - Friday 8am -3pm.          °[address] °[place], [place] [address] °336-676-6840 or crisis line- 336-676-6905 ° °[facility]/ Intensive Outpatient Therapy Program °[address] °[place], [place] [address] °336-832-9804 ° °[facility]                  °Crisis Services      °[phone]      °[address]     °[place], [place] [address]                ° °[facility]   °[facility] °[phone] °[address] °[place], [place] [address] ° ° °[facility]          °[address]  °[place], [place] [address]       °([phone] ° °[facility] °[address] °[place], [place] [address] °336-292-1510 ° °[facility]    °[address]    °[place], [place] [address]     °[phone]      ° °Parish McKinney, MD     °[address]     °[place] [address]     °336-282-1251     °  °[facility] °[address] °[place] [address] ° °[facility]     °[address]     °[place], [place]      °336-542-2076      ° °[facility] °Shamsher Ahluwalia, MD °[address] °[place], [place] [address] °336-420-9558 ° °[facility]     °[address]     °[place], [place] [address]     °336-274-1237      ° °[facility] °[address] °[place], [place] [address] °336-854-4450 °Resources for Temporary Residential Assistance/Crisis Centers ° °DAY CENTERS °[facility] °M-F 8am-3pm   °[address] [place], [place] [address]   336-332-0824 °Services include: laundry, barbering, support groups, case management, phone  & computer access, showers, AA/NA mtgs, mental health/substance abuse nurse, job skills class, disability information, VA assistance, spiritual classes, etc.  ° °HOMELESS SHELTERS ° °[facility]     °[facility]   °[address], [place]     °336.271.5959       °       °[facility] (women and children)       °[address] °[place], [place] [address] °336-275-0820 °[email or web address] for application and process °Application Required ° °[facility]   °[address]    °[place] [address]     °[phone]       °             °[facility] °[address] °, [place] [address] °336.273.5572 °336-235-0363(schedule application appt.) °Application Required ° °[facility] (women only)    °[address]     °[place], [place] [address]     °336-884-1039      °  Intake starts 6pm daily °Need valid ID, SSC, & Police report °[facility] [place] °[address] °[place], [place] °336-881-5420 °Application Required ° °[facility] (men only)     °[address]      °[place], [place]     °[phone]      ° °[facility] °(Pregnant women only) °[address] °[place], [place] °336-275-0206 ° °[facility]      °[address]      °[place], [place] [address]     °[phone]      °       °[facility] [facility] °[address] °[place], [place] °336-723-1848 °90 day commitment/SA/Application process ° °[facility](men only)     °[address]     °[place], [place]     °[phone]       °Check-in at 7pm     °       °[facility] °[address] °[place], [place] [address] °336-248-6684 °Men/Women/Women and Children must be there by 7 pm ° °[facility] °[place],  °336-722-8721                ° °

## 2022-04-25 ENCOUNTER — Other Ambulatory Visit (HOSPITAL_COMMUNITY)
Admission: EM | Admit: 2022-04-25 | Discharge: 2022-04-28 | Disposition: A | Discharge status: Home / Self Care | Payer: Medicaid Other | Attending: Psychiatry | Admitting: Psychiatry

## 2022-04-25 DIAGNOSIS — F10129 Alcohol abuse with intoxication, unspecified: Secondary | ICD-10-CM | POA: Insufficient documentation

## 2022-04-25 DIAGNOSIS — Z20822 Contact with and (suspected) exposure to covid-19: Secondary | ICD-10-CM | POA: Insufficient documentation

## 2022-04-25 DIAGNOSIS — F69 Unspecified disorder of adult personality and behavior: Secondary | ICD-10-CM

## 2022-04-25 DIAGNOSIS — F419 Anxiety disorder, unspecified: Secondary | ICD-10-CM

## 2022-04-25 DIAGNOSIS — F1010 Alcohol abuse, uncomplicated: Secondary | ICD-10-CM | POA: Diagnosis present

## 2022-04-25 LAB — POC SARS CORONAVIRUS 2 AG: SARSCOV2ONAVIRUS 2 AG: NEGATIVE

## 2022-04-25 MED ORDER — LORAZEPAM 1 MG PO TABS: 1.0000 mg | ORAL_TABLET | Freq: Four times a day (QID) | ORAL | Status: DC | PRN

## 2022-04-25 MED ORDER — LORAZEPAM 1 MG PO TABS: 1.0000 mg | ORAL_TABLET | Freq: Every day | ORAL | Status: DC

## 2022-04-25 MED ORDER — LOPERAMIDE HCL 2 MG PO CAPS: 2.0000 mg | ORAL_CAPSULE | ORAL | Status: DC | PRN

## 2022-04-25 MED ORDER — THIAMINE HCL 100 MG PO TABS
100.0000 mg | ORAL_TABLET | Freq: Every day | ORAL | Status: DC
  Administered 2022-04-26 – 2022-04-28 (×3): 100 mg via ORAL
  Filled 2022-04-25 (×3): qty 1

## 2022-04-25 MED ORDER — HYDROXYZINE HCL 25 MG PO TABS
25.0000 mg | ORAL_TABLET | Freq: Four times a day (QID) | ORAL | Status: DC | PRN
  Administered 2022-04-26: 25 mg via ORAL
  Filled 2022-04-25: qty 1

## 2022-04-25 MED ORDER — LORAZEPAM 1 MG PO TABS
1.0000 mg | ORAL_TABLET | Freq: Four times a day (QID) | ORAL | Status: DC
  Administered 2022-04-26 (×2): 1 mg via ORAL
  Filled 2022-04-25 (×2): qty 1

## 2022-04-25 MED ORDER — LORAZEPAM 1 MG PO TABS: 1.0000 mg | ORAL_TABLET | Freq: Two times a day (BID) | ORAL | Status: DC

## 2022-04-25 MED ORDER — LORAZEPAM 1 MG PO TABS: 1.0000 mg | ORAL_TABLET | Freq: Three times a day (TID) | ORAL | Status: DC

## 2022-04-25 MED ORDER — ONDANSETRON 4 MG PO TBDP
4.0000 mg | ORAL_TABLET | Freq: Four times a day (QID) | ORAL | Status: DC | PRN
  Administered 2022-04-26: 4 mg via ORAL
  Filled 2022-04-25: qty 1

## 2022-04-25 MED ORDER — MAGNESIUM HYDROXIDE 400 MG/5ML PO SUSP: 30.0000 mL | Freq: Every day | ORAL | Status: DC | PRN

## 2022-04-25 MED ORDER — ALUM & MAG HYDROXIDE-SIMETH 200-200-20 MG/5ML PO SUSP: 30.0000 mL | ORAL | Status: DC | PRN

## 2022-04-25 MED ORDER — THIAMINE HCL 100 MG/ML IJ SOLN
100.0000 mg | Freq: Once | INTRAMUSCULAR | Status: AC
  Administered 2022-04-26: 100 mg via INTRAMUSCULAR
  Filled 2022-04-25: qty 2

## 2022-04-25 MED ORDER — ACETAMINOPHEN 325 MG PO TABS
650.0000 mg | ORAL_TABLET | Freq: Four times a day (QID) | ORAL | Status: DC | PRN
  Administered 2022-04-26: 650 mg via ORAL
  Filled 2022-04-25: qty 2

## 2022-04-25 MED ORDER — ADULT MULTIVITAMIN W/MINERALS CH
1.0000 | ORAL_TABLET | Freq: Every day | ORAL | Status: DC
  Administered 2022-04-26 – 2022-04-28 (×3): 1 via ORAL
  Filled 2022-04-25 (×3): qty 1

## 2022-04-25 NOTE — Progress Notes (Signed)
   04/25/22 2241  [facility] Triage Screening (Walk-ins at [facility] only)  How Did You Hear About [place]? Self  What Is the Reason for Your Visit/Call Today? Tim [name] is a 66 year old male presenting to [facility] due to alcohol intoxication. Patient denied SI, HI, psychosis and drug usage. Patient is intoxicated. Patient reported consuming alcohol prior to arrival and unable to tell how much he drank. Patient speech is slurred. Patient continues to state "I am overwhelmed, I have problems with decision-making". TTS clinician is unable to understand at times what patient is trying to say. At times, it appears that patient is rambling. Patient reported seeing someone for psychiatry and that he is prescribed Vistaril. Patient reported worsening depressive symptoms. No additional information due to patients current presentation. Patient is urgent. Patient was discharged from [facility] on 04/16/2022 after being held overnight for continuous observation.  How Long Has This Been Causing You Problems? > than 6 months  Have You Recently Had Any Thoughts About Hurting Yourself? No  Are You Planning to Commit Suicide/Harm Yourself At This time? No  Have you Recently Had Thoughts About Hurting Someone [name]? No  Are You Planning To Harm Someone At This Time? No  Are you currently experiencing any auditory, visual or other hallucinations? No  Have You Used Any Alcohol or Drugs in the Past 24 Hours? Yes  How long ago did you use Drugs or Alcohol? prior to arrival  What Did You Use and How Much? alcohol "bottle"  Do you have any current medical co-morbidities that require immediate attention? No (none reported)  Clinician description of patient physical appearance/behavior: disheveled / intoxicated  What Do You Feel Would Help You the Most Today? Alcohol or Drug Use Treatment  If access to [facility] was not available, would you have sought care in the Emergency Department?  Yes  Determination of Need Urgent (48 hours)  Options For Referral Facility-Based Crisis;Outpatient Therapy;Medication Management

## 2022-04-25 NOTE — ED Provider Notes (Signed)
[facility] Continuous Assessment Admission H&P  Date: 04/25/22 Patient Name: Tim [name] MRN: [id number] Chief Complaint:  Chief Complaint  Patient presents with   Alcohol Problem      Diagnoses:  Final diagnoses:  Alcohol abuse with intoxication (HCC)  Anxious mood  Behavior concern in adult    HPI: Tim [name],  66 y.o male,  with a history of alcohol abuse and substance abuse, presented to [facility] by [employer].  Patient is currently intoxicated, with speech incoherent.  Patient has word salad and goes off on a tantrum when asked a question at this time patient is not answering questions appropriately because of his intoxication.  Copied from TTS notes,  [name] is a 66 year old male presenting to [facility] due to alcohol intoxication. Patient denied SI, HI, psychosis and drug usage. Patient is intoxicated. Patient reported consuming alcohol prior to arrival and unable to tell how much he drank. Patient speech is slurred. Patient continues to state "I am overwhelmed, I have problems with decision-making". TTS clinician is unable to understand at times what patient is trying to say. At times, it appears that patient is rambling. Patient reported seeing someone for psychiatry and that he is prescribed Vistaril. Patient reported worsening depressive symptoms. No additional information due to patients current presentation. Patient is urgent. Patient was discharged from [facility] on 04/16/2022 after being held overnight for continuous observation.  Face-to-face observation of patient, patient is alert and oriented to himself, maintained eye contact.  Mood is intoxicated and under the influence affect is congruent with mood.  Patient denies SI, HI, AH VH, paranoia.  According to patient he last drank alcohol just before being picked up by the police and brought here.  Patient denies he is taking any medication, he denies he  is using any illicit drugs at this moment.  Patient is cognitively impaired at this moment and therefore cannot provide a proper history of events.   Recommend inpatient observation  PHQ 2-9:   Flowsheet Row ED from 04/15/2022 in [facility] ED from 03/29/2022 in [facility] from 03/12/2022 in [facility]  C-SSRS RISK CATEGORY High Risk No Risk No Risk        Total Time spent with patient: 20 minutes  Musculoskeletal  Strength & Muscle Tone: within normal limits Gait & Station: normal Patient leans: N/A  Psychiatric Specialty Exam  Presentation General Appearance: Bizarre  Eye Contact:Fair  Speech:Garbled  Speech Volume:Decreased  Handedness:Ambidextrous   Mood and Affect  Mood:Anxious  Affect:Flat   Thought Process  Thought Processes:Irrevelant  Descriptions of Associations:Loose  Orientation:Partial  Thought Content:Illogical  Diagnosis of Schizophrenia or Schizoaffective disorder in past: No   Hallucinations:Hallucinations: None  Ideas of Reference:None  Suicidal Thoughts:Suicidal Thoughts: No  Homicidal Thoughts:Homicidal Thoughts: No   Sensorium  Memory:Immediate Fair  Judgment:Poor  Insight:Poor   Executive Functions  Concentration:Poor  Attention Span:Fair  Recall:Poor  Fund of Knowledge:Fair  Language:Fair   Psychomotor Activity  Psychomotor Activity:Psychomotor Activity: Normal   Assets  Assets:Desire for Improvement; Communication Skills   Sleep  Sleep:Sleep: Fair   Nutritional Assessment (For OBS and FBC admissions only) Has the patient had a weight loss or gain of 10 pounds or more in the last 3 months?: No Has the patient had a decrease in food intake/or appetite?: No Does the patient have dental problems?: No Does the patient have eating habits or  behaviors that may be indicators of an eating disorder including  binging or inducing vomiting?: No Has the patient recently lost weight without trying?: 0 Has the patient been eating poorly because of a decreased appetite?: 0 Malnutrition Screening Tool Score: 0    Physical Exam HENT:     Head: Normocephalic.     Nose: Nose normal.  Cardiovascular:     Rate and Rhythm: Normal rate.  Pulmonary:     Effort: Pulmonary effort is normal.  Abdominal:     General: Abdomen is flat.  Musculoskeletal:        General: Normal range of motion.     Cervical back: Normal range of motion.  Skin:    General: Skin is warm.  Neurological:     General: No focal deficit present.     Mental Status: He is alert.  Psychiatric:        Mood and Affect: Mood normal.        Behavior: Behavior normal.        Thought Content: Thought content normal.        Judgment: Judgment normal.    Review of Systems  Constitutional: Negative.   HENT: Negative.    Eyes: Negative.   Respiratory: Negative.    Cardiovascular: Negative.   Gastrointestinal: Negative.   Genitourinary: Negative.   Musculoskeletal: Negative.   Skin: Negative.   Neurological: Negative.   Endo/Heme/Allergies: Negative.   Psychiatric/Behavioral:  Positive for substance abuse. The patient is nervous/anxious.     Blood pressure (!) 143/76, pulse 69, temperature 98.5 F (36.9 C), temperature source Oral, resp. rate 18, SpO2 96 %. There is no height or weight on file to calculate BMI.  Past Psychiatric History: Alcohol abuse, polysubstance abuse  Is the patient at risk to self? No  Has the patient been a risk to self in the past 6 months? No .    Has the patient been a risk to self within the distant past? No   Is the patient a risk to others? No   Has the patient been a risk to others in the past 6 months? No   Has the patient been a risk to others within the distant past? No   Past Medical History:  Past Medical History:  Diagnosis Date   Alcoholism (HCC)    Anxiety    COPD (chronic  obstructive pulmonary disease) (HCC)    Depression    Hep C w/o coma, chronic (HCC)    History of hiatal hernia    hernia rt groin   Mental health disorder    Scoliosis    Thyroid disease     Past Surgical History:  Procedure Laterality Date   INGUINAL HERNIA REPAIR Right 10/09/2021   Procedure: HERNIA REPAIR INGUINAL INCARCERATED;  Surgeon: Harriette Bouillon, MD;  Location: [facility]S;  Service: General;  Laterality: Right;   SKIN CANCER EXCISION     THYROID SURGERY      Family History:  Family History  Problem Relation Age of Onset   Dementia Mother     Social History:  Social History   Socioeconomic History   Marital status: Single    Spouse name: Not on file   Number of children: Not on file   Years of education: Not on file   Highest education level: Not on file  Occupational History   Not on file  Tobacco Use   Smoking status: Every Day    Packs/day: 0.50    Years: 40.00  Total pack years: 20.00    Types: Cigarettes   Smokeless tobacco: Never   Tobacco comments:    Patient refused  Vaping Use   Vaping Use: Never used  Substance and Sexual Activity   Alcohol use: Yes   Drug use: Yes    Types: Marijuana   Sexual activity: Never    Birth control/protection: None  Other Topics Concern   Not on file  Social History Narrative   ** Merged History Encounter **       Social Determinants of Health   Financial Resource Strain: Not on file  Food Insecurity: Not on file  Transportation Needs: Not on file  Physical Activity: Not on file  Stress: Not on file  Social Connections: Not on file  Intimate Partner Violence: Not on file    SDOH:  SDOH Screenings   Alcohol Screen: Medium Risk (02/14/2020)   Alcohol Screen    Last Alcohol Screening Score (AUDIT): 26  Depression (PHQ2-9): Low Risk  (09/01/2021)   Depression (PHQ2-9)    PHQ-2 Score: 0  Financial Resource Strain: Not on file  Food Insecurity: Not on file  Housing: Not on file  Physical Activity:  Not on file  Social Connections: Not on file  Stress: Not on file  Tobacco Use: High Risk (04/16/2022)   Patient History    Smoking Tobacco Use: Every Day    Smokeless Tobacco Use: Never    Passive Exposure: Not on file  Transportation Needs: Not on file    Last Labs:  Admission on 04/15/2022, Discharged on 04/16/2022  Component Date Value Ref Range Status   SARS Coronavirus 2 by RT PCR 04/16/2022 NEGATIVE  NEGATIVE Final   Comment: (NOTE) SARS-CoV-2 target nucleic acids are NOT DETECTED.  The SARS-CoV-2 RNA is generally detectable in upper respiratory specimens during the acute phase of infection. The lowest concentration of SARS-CoV-2 viral copies this assay can detect is 138 copies/mL. A negative result does not preclude SARS-Cov-2 infection and should not be used as the sole basis for treatment or other patient management decisions. A negative result may occur with  improper specimen collection/handling, submission of specimen other than nasopharyngeal swab, presence of viral mutation(s) within the areas targeted by this assay, and inadequate number of viral copies(<138 copies/mL). A negative result must be combined with clinical observations, patient history, and epidemiological information. The expected result is Negative.  Fact Sheet for Patients:  BloggerCourse.comhttps://www.fda.gov/media/152166/download  Fact Sheet for Healthcare Providers:  SeriousBroker.ithttps://www.fda.gov/media/152162/download  This test is no                          t yet approved or cleared by the [place] FDA and  has been authorized for detection and/or diagnosis of SARS-CoV-2 by FDA under an Emergency Use Authorization (EUA). This EUA will remain  in effect (meaning this test can be used) for the duration of the COVID-19 declaration under Section 564(b)(1) of the Act, 21 U.S.C.section 360bbb-3(b)(1), unless the authorization is terminated  or revoked sooner.       Influenza A by PCR 04/16/2022 NEGATIVE   NEGATIVE Final   Influenza B by PCR 04/16/2022 NEGATIVE  NEGATIVE Final   Comment: (NOTE) The Xpert Xpress SARS-CoV-2/FLU/RSV plus assay is intended as an aid in the diagnosis of influenza from Nasopharyngeal swab specimens and should not be used as a sole basis for treatment. Nasal washings and aspirates are unacceptable for Xpert Xpress SARS-CoV-2/FLU/RSV testing.  Fact Sheet for Patients: BloggerCourse.comhttps://www.fda.gov/media/152166/download  Fact Sheet for Healthcare Providers: SeriousBroker.it  This test is not yet approved or cleared by the [place] FDA and has been authorized for detection and/or diagnosis of SARS-CoV-2 by FDA under an Emergency Use Authorization (EUA). This EUA will remain in effect (meaning this test can be used) for the duration of the COVID-19 declaration under Section 564(b)(1) of the Act, 21 U.S.C. section 360bbb-3(b)(1), unless the authorization is terminated or revoked.  Performed at [facility], [address] [place], [place] [address]    WBC 04/16/2022 5.3  4.0 - 10.5 K/uL Final   RBC 04/16/2022 4.96  4.22 - 5.81 MIL/uL Final   Hemoglobin 04/16/2022 15.2  13.0 - 17.0 g/dL Final   HCT 81/19/1478 44.7  39.0 - 52.0 % Final   MCV 04/16/2022 90.1  80.0 - 100.0 fL Final   MCH 04/16/2022 30.6  26.0 - 34.0 pg Final   MCHC 04/16/2022 34.0  30.0 - 36.0 g/dL Final   RDW 29/56/2130 12.7  11.5 - 15.5 % Final   Platelets 04/16/2022 190  150 - 400 K/uL Final   nRBC 04/16/2022 0.0  0.0 - 0.2 % Final   Neutrophils Relative % 04/16/2022 51  % Final   Neutro Abs 04/16/2022 2.7  1.7 - 7.7 K/uL Final   Lymphocytes Relative 04/16/2022 31  % Final   Lymphs Abs 04/16/2022 1.7  0.7 - 4.0 K/uL Final   Monocytes Relative 04/16/2022 10  % Final   Monocytes Absolute 04/16/2022 0.5  0.1 - 1.0 K/uL Final   Eosinophils Relative 04/16/2022 5  % Final   Eosinophils Absolute 04/16/2022 0.3  0.0 - 0.5 K/uL Final   Basophils Relative 04/16/2022 2  %  Final   Basophils Absolute 04/16/2022 0.1  0.0 - 0.1 K/uL Final   Immature Granulocytes 04/16/2022 1  % Final   Abs Immature Granulocytes 04/16/2022 0.03  0.00 - 0.07 K/uL Final   Performed at [facility], [address] [place], [place] [address]   Sodium 04/16/2022 137  135 - 145 mmol/L Final   Potassium 04/16/2022 4.5  3.5 - 5.1 mmol/L Final   Chloride 04/16/2022 104  98 - 111 mmol/L Final   CO2 04/16/2022 27  22 - 32 mmol/L Final   Glucose, Bld 04/16/2022 94  70 - 99 mg/dL Final   Glucose reference range applies only to samples taken after fasting for at least 8 hours.   BUN 04/16/2022 12  8 - 23 mg/dL Final   Creatinine, Ser 04/16/2022 1.08  0.61 - 1.24 mg/dL Final   Calcium 46/96/2952 10.2  8.9 - 10.3 mg/dL Final   Total Protein 84/13/2440 6.4 (L)  6.5 - 8.1 g/dL Final   Albumin 08/13/2535 4.1  3.5 - 5.0 g/dL Final   AST 64/40/3474 22  15 - 41 U/L Final   ALT 04/16/2022 19  0 - 44 U/L Final   Alkaline Phosphatase 04/16/2022 61  38 - 126 U/L Final   Total Bilirubin 04/16/2022 0.7  0.3 - 1.2 mg/dL Final   GFR, Estimated 04/16/2022 >60  >60 mL/min Final   Comment: (NOTE) Calculated using the CKD-EPI Creatinine Equation (2021)    Anion gap 04/16/2022 6  5 - 15 Final   Performed at [facility], [address], [place], [place] [address]   Hgb A1c MFr Bld 04/16/2022 5.6  4.8 - 5.6 % Final   Comment: (NOTE) Pre diabetes:          5.7%-6.4%  Diabetes:              >  6.4%  Glycemic control for   <7.0% adults with diabetes    Mean Plasma Glucose 04/16/2022 114.02  mg/dL Final   Performed at [facility], [address], [place], [place] [address]   Alcohol, Ethyl (B) 04/16/2022 201 (H)  <10 mg/dL Final   Comment: (NOTE) Lowest detectable limit for serum alcohol is 10 mg/dL.  For medical purposes only. Performed at [facility], [address] [place] [address] [place], [place] [address]    TSH 04/16/2022 2.894  0.350 - 4.500 uIU/mL Final   Comment: Performed by a  3rd Generation assay with a functional sensitivity of <=0.01 uIU/mL. Performed at [facility], [address], [place], [place] [address]    POC Amphetamine UR 04/15/2022 None Detected  NONE DETECTED (Cut Off Level 1000 ng/mL) Final   POC Secobarbital (BAR) 04/15/2022 None Detected  NONE DETECTED (Cut Off Level 300 ng/mL) Final   POC Buprenorphine (BUP) 04/15/2022 None Detected  NONE DETECTED (Cut Off Level 10 ng/mL) Final   POC Oxazepam (BZO) 04/15/2022 None Detected  NONE DETECTED (Cut Off Level 300 ng/mL) Final   POC Cocaine UR 04/15/2022 Positive (A)  NONE DETECTED (Cut Off Level 300 ng/mL) Final   POC Methamphetamine UR 04/15/2022 None Detected  NONE DETECTED (Cut Off Level 1000 ng/mL) Final   POC Morphine 04/15/2022 None Detected  NONE DETECTED (Cut Off Level 300 ng/mL) Final   POC Methadone UR 04/15/2022 None Detected  NONE DETECTED (Cut Off Level 300 ng/mL) Final   POC Oxycodone UR 04/15/2022 None Detected  NONE DETECTED (Cut Off Level 100 ng/mL) Final   POC Marijuana UR 04/15/2022 Positive (A)  NONE DETECTED (Cut Off Level 50 ng/mL) Final   SARSCOV2ONAVIRUS 2 AG 04/16/2022 NEGATIVE  NEGATIVE Final   Comment: (NOTE) SARS-CoV-2 antigen NOT DETECTED.   Negative results are presumptive.  Negative results do not preclude SARS-CoV-2 infection and should not be used as the sole basis for treatment or other patient management decisions, including infection  control decisions, particularly in the presence of clinical signs and  symptoms consistent with COVID-19, or in those who have been in contact with the virus.  Negative results must be combined with clinical observations, patient history, and epidemiological information. The expected result is Negative.  Fact Sheet for Patients: https://www.jennings-kim.com/  Fact Sheet for Healthcare Providers: https://alexander-rogers.biz/  This test is not yet approved or cleared by the [place] FDA and  has  been authorized for detection and/or diagnosis of SARS-CoV-2 by FDA under an Emergency Use Authorization (EUA).  This EUA will remain in effect (meaning this test can be used) for the duration of  the COV                          ID-19 declaration under Section 564(b)(1) of the Act, 21 U.S.C. section 360bbb-3(b)(1), unless the authorization is terminated or revoked sooner.     SARSCOV2ONAVIRUS 2 AG 04/16/2022 NEGATIVE  NEGATIVE Final   Comment: (NOTE) SARS-CoV-2 antigen NOT DETECTED.   Negative results are presumptive.  Negative results do not preclude SARS-CoV-2 infection and should not be used as the sole basis for treatment or other patient management decisions, including infection  control decisions, particularly in the presence of clinical signs and  symptoms consistent with COVID-19, or in those who have been in contact with the virus.  Negative results must be combined with clinical observations, patient history, and epidemiological information. The expected result is Negative.  Fact  Sheet for Patients: https://www.jennings-kim.com/  Fact Sheet for Healthcare Providers: https://alexander-rogers.biz/  This test is not yet approved or cleared by the [place] FDA and  has been authorized for detection and/or diagnosis of SARS-CoV-2 by FDA under an Emergency Use Authorization (EUA).  This EUA will remain in effect (meaning this test can be used) for the duration of  the COV                          ID-19 declaration under Section 564(b)(1) of the Act, 21 U.S.C. section 360bbb-3(b)(1), unless the authorization is terminated or revoked sooner.    Admission on 11/10/2021, Discharged on 11/10/2021  Component Date Value Ref Range Status   Sodium 11/10/2021 136  135 - 145 mmol/L Final   Potassium 11/10/2021 4.3  3.5 - 5.1 mmol/L Final   Chloride 11/10/2021 106  98 - 111 mmol/L Final   CO2 11/10/2021 24  22 - 32 mmol/L Final   Glucose, Bld  11/10/2021 92  70 - 99 mg/dL Final   Glucose reference range applies only to samples taken after fasting for at least 8 hours.   BUN 11/10/2021 16  8 - 23 mg/dL Final   Creatinine, Ser 11/10/2021 0.93  0.61 - 1.24 mg/dL Final   Calcium 16/07/9603 9.9  8.9 - 10.3 mg/dL Final   GFR, Estimated 11/10/2021 >60  >60 mL/min Final   Comment: (NOTE) Calculated using the CKD-EPI Creatinine Equation (2021)    Anion gap 11/10/2021 6  5 - 15 Final   Performed at [facility], [address], [place], [place] [address]   WBC 11/10/2021 5.7  4.0 - 10.5 K/uL Final   RBC 11/10/2021 4.78  4.22 - 5.81 MIL/uL Final   Hemoglobin 11/10/2021 15.5  13.0 - 17.0 g/dL Final   HCT 11/91/4782 43.5  39.0 - 52.0 % Final   MCV 11/10/2021 91.0  80.0 - 100.0 fL Final   MCH 11/10/2021 32.4  26.0 - 34.0 pg Final   MCHC 11/10/2021 35.6  30.0 - 36.0 g/dL Final   RDW 95/62/1308 13.2  11.5 - 15.5 % Final   Platelets 11/10/2021 167  150 - 400 K/uL Final   nRBC 11/10/2021 0.0  0.0 - 0.2 % Final   Neutrophils Relative % 11/10/2021 67  % Final   Neutro Abs 11/10/2021 3.8  1.7 - 7.7 K/uL Final   Lymphocytes Relative 11/10/2021 21  % Final   Lymphs Abs 11/10/2021 1.2  0.7 - 4.0 K/uL Final   Monocytes Relative 11/10/2021 7  % Final   Monocytes Absolute 11/10/2021 0.4  0.1 - 1.0 K/uL Final   Eosinophils Relative 11/10/2021 4  % Final   Eosinophils Absolute 11/10/2021 0.2  0.0 - 0.5 K/uL Final   Basophils Relative 11/10/2021 1  % Final   Basophils Absolute 11/10/2021 0.1  0.0 - 0.1 K/uL Final   Immature Granulocytes 11/10/2021 0  % Final   Abs Immature Granulocytes 11/10/2021 0.01  0.00 - 0.07 K/uL Final   Performed at [facility], [address] [place], [place] [address]    Allergies: Patient has no known allergies.  PTA Medications: (Not in a hospital admission)   Medical Decision Making  Inpatient observation Meds ordered this encounter  Medications   acetaminophen (TYLENOL)  tablet 650 mg   alum & mag hydroxide-simeth (MAALOX/MYLANTA) 200-200-20 MG/5ML suspension 30 mL   magnesium hydroxide (MILK OF MAGNESIA) suspension 30 mL   thiamine (B-1) injection  100 mg   thiamine tablet 100 mg   multivitamin with minerals tablet 1 tablet   LORazepam (ATIVAN) tablet 1 mg   hydrOXYzine (ATARAX) tablet 25 mg   loperamide (IMODIUM) capsule 2-4 mg   ondansetron (ZOFRAN-ODT) disintegrating tablet 4 mg   FOLLOWED BY Linked Order Group    LORazepam (ATIVAN) tablet 1 mg    LORazepam (ATIVAN) tablet 1 mg    LORazepam (ATIVAN) tablet 1 mg    LORazepam (ATIVAN) tablet 1 mg   traZODone (DESYREL) tablet 50 mg   hydrOXYzine (ATARAX) tablet 25 mg   LORazepam (ATIVAN) tablet 1 mg    Lab Orders         Resp Panel by RT-PCR (Flu A&B, Covid) Anterior Nasal Swab         TSH         Ethanol         POCT Urine Drug Screen - (I-Screen)         POC SARS Coronavirus 2 Ag        Recommendations  Based on my evaluation the patient does not appear to have an emergency medical condition.  Sindy Guadeloupe, NP 04/25/22  11:03 PM

## 2022-04-26 DIAGNOSIS — F10129 Alcohol abuse with intoxication, unspecified: Secondary | ICD-10-CM | POA: Diagnosis not present

## 2022-04-26 DIAGNOSIS — Z20822 Contact with and (suspected) exposure to covid-19: Secondary | ICD-10-CM | POA: Diagnosis not present

## 2022-04-26 DIAGNOSIS — F1010 Alcohol abuse, uncomplicated: Secondary | ICD-10-CM | POA: Diagnosis present

## 2022-04-26 LAB — POCT URINE DRUG SCREEN - MANUAL ENTRY (I-SCREEN)
POC Amphetamine UR: NOT DETECTED
POC Buprenorphine (BUP): NOT DETECTED
POC Cocaine UR: POSITIVE — AB
POC Marijuana UR: POSITIVE — AB
POC Methadone UR: NOT DETECTED
POC Methamphetamine UR: NOT DETECTED
POC Morphine: NOT DETECTED
POC Oxazepam (BZO): NOT DETECTED
POC Oxycodone UR: NOT DETECTED
POC Secobarbital (BAR): NOT DETECTED

## 2022-04-26 LAB — RESP PANEL BY RT-PCR (FLU A&B, COVID) ARPGX2
Influenza A by PCR: NEGATIVE
Influenza B by PCR: NEGATIVE
SARS Coronavirus 2 by RT PCR: NEGATIVE

## 2022-04-26 LAB — ETHANOL: Alcohol, Ethyl (B): 146 mg/dL — ABNORMAL HIGH (ref <10)

## 2022-04-26 LAB — TSH: TSH: 3.275 u[IU]/mL (ref 0.350–4.500)

## 2022-04-26 MED ORDER — LORAZEPAM 1 MG PO TABS
1.0000 mg | ORAL_TABLET | Freq: Once | ORAL | Status: AC
Start: 2022-04-26 — End: 2022-04-26
  Administered 2022-04-26: 1 mg via ORAL
  Filled 2022-04-26: qty 1

## 2022-04-26 MED ORDER — LIDOCAINE 5 % EX PTCH: 1.0000 | MEDICATED_PATCH | Freq: Two times a day (BID) | CUTANEOUS | Status: DC | PRN

## 2022-04-26 MED ORDER — IBUPROFEN 400 MG PO TABS: 400.0000 mg | ORAL_TABLET | Freq: Three times a day (TID) | ORAL | Status: DC | PRN

## 2022-04-26 MED ORDER — NICOTINE 21 MG/24HR TD PT24
21.0000 mg | MEDICATED_PATCH | Freq: Every day | TRANSDERMAL | Status: DC
  Administered 2022-04-27 – 2022-04-28 (×2): 21 mg via TRANSDERMAL
  Filled 2022-04-26: qty 7
  Filled 2022-04-26 (×3): qty 1

## 2022-04-26 MED ORDER — HYDROXYZINE HCL 50 MG PO TABS
50.0000 mg | ORAL_TABLET | Freq: Three times a day (TID) | ORAL | Status: DC | PRN
  Filled 2022-04-26: qty 10

## 2022-04-26 MED ORDER — HYDROXYZINE HCL 25 MG PO TABS
25.0000 mg | ORAL_TABLET | Freq: Three times a day (TID) | ORAL | Status: DC | PRN
Start: 2022-04-26 — End: 2022-04-26

## 2022-04-26 MED ORDER — ESCITALOPRAM OXALATE 10 MG PO TABS
20.0000 mg | ORAL_TABLET | Freq: Every day | ORAL | Status: DC
  Administered 2022-04-26 – 2022-04-28 (×3): 20 mg via ORAL
  Filled 2022-04-26 (×3): qty 2
  Filled 2022-04-26: qty 14

## 2022-04-26 MED ORDER — MOMETASONE FURO-FORMOTEROL FUM 200-5 MCG/ACT IN AERO
2.0000 | INHALATION_SPRAY | Freq: Two times a day (BID) | RESPIRATORY_TRACT | Status: DC
  Administered 2022-04-26 – 2022-04-28 (×4): 2 via RESPIRATORY_TRACT
  Filled 2022-04-26: qty 8.8

## 2022-04-26 MED ORDER — TRAZODONE HCL 50 MG PO TABS
50.0000 mg | ORAL_TABLET | Freq: Every evening | ORAL | Status: DC | PRN
  Administered 2022-04-26 – 2022-04-27 (×2): 50 mg via ORAL
  Filled 2022-04-26 (×2): qty 1

## 2022-04-26 NOTE — ED Notes (Signed)
Pt sleeping@this time. Breathing even and unlabored. Will continue to monitor for safety 
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
## 2022-04-26 NOTE — Clinical Social Work Psych Note (Signed)
LCSW Initial Note   LCSW met with Tim [name] for introduction and to begin discussions regarding treatment and potential discharge planning. Initially, Tim [name] was hard to wake up, however after multiple attempts, he awoke and was cooperative with this Probation officer.   Tim [name] presented with an euthymic affect, congruent mood. Even denied having any SI, HI or AVH at this time.   Tim [name] shared that he came to the [facility] due to experiencing feelings of being overwhelmed and alcohol intoxication. According to Tim [name]'s initial CCA note, "66 year old male presenting to [facility] due to alcohol intoxication. Patient denied SI, HI, psychosis and drug usage. Patient is intoxicated. Patient reported consuming alcohol prior to arrival and unable to tell how much he drank. Patient speech is slurred. Patient continues to state "I am overwhelmed, I have problems with decision-making". TTS clinician is unable to understand at times what patient is trying to say. At times, it appears that patient is rambling. Patient reported seeing someone for psychiatry and that he is prescribed Vistaril. Patient reported worsening depressive symptoms. No additional information due to patients current presentation. Patient is urgent. Patient was discharged from [facility] on 04/16/2022 after being held overnight for continuous observation".   Tim [name] shared with this Probation officer that he did NOT want any residential substance abuse treatment services/resources at this time, however he is interested in outpatient substance abuse services.   Tim [name] reports that once he completes his detox, he plans to return home. Tim [name] reports he currently lives alone in [place], [place].   Tim [name] denied having any additional questions or concerns at this time.   Radonna Ricker, MSW, LCSW Clinical Education officer, museum ([place]) [facility]

## 2022-04-26 NOTE — ED Provider Notes (Signed)
Facility Based Crisis Admission H&P  Date: 04/26/22 Patient Name: Tim [name] MRN: [id number] Chief Complaint:  Chief Complaint  Patient presents with   Alcohol Problem      Diagnoses:  Final diagnoses:  Alcohol abuse with intoxication ([place])  Anxious mood  Behavior concern in adult    HPI: Tim [name] 66 y.o., male patient who initially presented to [facility] with alcohol abuse and intoxication on 04/25/2022.  He was admitted to the continuous assessment unit.  Tim [name], 66 y.o., male patient seen face to face by this provider, consulted with Dr. Serafina Mitchell; and chart reviewed on 04/26/22.  Per chart review patient has a history of alcohol use disorder, GAD and MDD.  He has multiple encounters with area emergency rooms related to alcohol intoxication.  He recently presented to [facility] for overnight assessment and was discharged on 04/16/2022 for similar symptoms.  He denied substance abuse treatment at that time.  On admission UDS is positive for cocaine and marijuana.  EtOH level is 146  On evaluation Tim [name] reports he is feeling better today.  He is disheveled and makes fair eye contact.  His speech is garbled at times but when asked to repeat a question he can clear his throat and his speech is clear and coherent.  He is alert/oriented x4 and cooperative.  He endorses increased depression with feelings of helplessness, guilt, worthlessness, and decreased motivation.  He has a depressed affect.  Reports being triggered by his mother having to be placed in a nursing home.  Reports yesterday he became overwhelmed and drank too much.  He is vague about the amount of alcohol that he intakes including the types of alcohol.   states he drinks liquor and beer.  He endorses occasional cocaine use.  He endorses withdrawal symptoms that include anxiety and "feeling shaky".  He denies any past history of alcohol withdrawal seizure or delirium tremors.  He denies SI/HI/AVH.  He denies  access to firearms/weapons.  Objectively he does not appear to be responding to internal/external stimuli.  He denies paranoid or delusional thought content.  He is able to answer questions appropriately.  Discussed admission to the facility based crisis unit and patient is in agreement.  Milieu was explained including expectations.  Patient is in agreement.  At this time patient is not interested in residential treatment, he is requesting alcohol detox and upon discharge outpatient substance abuse resources.      PHQ 2-9:   [place] [facility] from 04/25/2022 in [facility] ED from 04/15/2022 in [facility] ED from 03/29/2022 in [place] DEPT  C-SSRS RISK CATEGORY High Risk High Risk No Risk        Total Time spent with patient: 30 minutes  Musculoskeletal  Strength & Muscle Tone: within normal limits Gait & Station: normal Patient leans: N/A  Psychiatric Specialty Exam  Presentation General Appearance: Disheveled  Eye Contact:Fair  Speech:Clear and Coherent; Garbled (garbled at times)  Speech Volume:Normal  Handedness:Right   Mood and Affect  Mood:Anxious; Depressed  Affect:Congruent   Thought Process  Thought Processes:Coherent  Descriptions of Associations:Intact  Orientation:Full (Time, Place and Person)  Thought Content:Logical  Diagnosis of Schizophrenia or Schizoaffective disorder in past: No   Hallucinations:Hallucinations: None  Ideas of Reference:None  Suicidal Thoughts:Suicidal Thoughts: No  Homicidal Thoughts:Homicidal Thoughts: No   Sensorium  Memory:Immediate Good; Recent Good; Remote Good  Judgment:Fair  Insight:Fair   Executive  Functions  Concentration:Good  Attention Span:Good  [place]  Language:Good   Psychomotor Activity  Psychomotor Activity:Psychomotor Activity: Normal   Assets  Assets:Communication  Skills; Desire for Improvement; Financial Resources/Insurance; Housing; Resilience; Physical Health   Sleep  Sleep:Sleep: Fair   Nutritional Assessment (For OBS and FBC admissions only) Has the patient had a weight loss or gain of 10 pounds or more in the last 3 months?: No Has the patient had a decrease in food intake/or appetite?: No Does the patient have dental problems?: No Does the patient have eating habits or behaviors that may be indicators of an eating disorder including binging or inducing vomiting?: No Has the patient recently lost weight without trying?: 0 Has the patient been eating poorly because of a decreased appetite?: 0 Malnutrition Screening Tool Score: 0    Physical Exam Vitals and nursing note reviewed.  Constitutional:      General: He is not in acute distress.    Appearance: Normal appearance. He is well-developed.  HENT:     Head: Normocephalic and atraumatic.     Right Ear: External ear normal.     Left Ear: External ear normal.  Eyes:     General:        Right eye: No discharge.        Left eye: No discharge.     Conjunctiva/sclera: Conjunctivae normal.  Cardiovascular:     Rate and Rhythm: Normal rate and regular rhythm.     Heart sounds: No murmur heard. Pulmonary:     Effort: Pulmonary effort is normal. No respiratory distress.     Breath sounds: Normal breath sounds.  Abdominal:     Palpations: Abdomen is soft.     Tenderness: There is no abdominal tenderness.  Musculoskeletal:        General: No swelling. Normal range of motion.     Cervical back: Normal range of motion and neck supple.  Skin:    General: Skin is warm and dry.     Capillary Refill: Capillary refill takes less than 2 seconds.     Coloration: Skin is not jaundiced or pale.  Neurological:     Mental Status: He is alert and oriented to person, place, and time.  Psychiatric:        Attention and Perception: Attention and perception normal.        Mood and Affect: Mood is  anxious and depressed.        Speech: Speech normal.        Behavior: Behavior normal. Behavior is cooperative.        Thought Content: Thought content normal.        Cognition and Memory: Cognition normal.        Judgment: Judgment normal.    Review of Systems  Constitutional: Negative.   HENT: Negative.    Eyes: Negative.   Respiratory: Negative.    Cardiovascular: Negative.   Musculoskeletal: Negative.   Skin: Negative.   Neurological: Negative.   Psychiatric/Behavioral:  Positive for depression and substance abuse.     Blood pressure 116/77, pulse 66, temperature (!) 97.3 F (36.3 C), temperature source Oral, resp. rate 18, SpO2 95 %. There is no height or weight on file to calculate BMI.  Past Psychiatric History: Alcohol use disorder, GAD and MDD  Is the patient at risk to self? No  Has the patient been a risk to self in the past 6 months? No .    Has the patient been  a risk to self within the distant past? No   Is the patient a risk to others? No   Has the patient been a risk to others in the past 6 months? No   Has the patient been a risk to others within the distant past? No   Past Medical History:  Past Medical History:  Diagnosis Date   Alcoholism (HCC)    Anxiety    COPD (chronic obstructive pulmonary disease) (HCC)    Depression    Hep C w/o coma, chronic (HCC)    History of hiatal hernia    hernia rt groin   Mental health disorder    Scoliosis    Thyroid disease     Past Surgical History:  Procedure Laterality Date   INGUINAL HERNIA REPAIR Right 10/09/2021   Procedure: HERNIA REPAIR INGUINAL INCARCERATED;  Surgeon: Harriette Bouillon, MD;  Location: [facility];  Service: General;  Laterality: Right;   SKIN CANCER EXCISION     THYROID SURGERY      Family History:  Family History  Problem Relation Age of Onset   Dementia Mother     Social History:  Social History   Socioeconomic History   Marital status: Single    Spouse name: Not on file    Number of children: Not on file   Years of education: Not on file   Highest education level: Not on file  Occupational History   Not on file  Tobacco Use   Smoking status: Every Day    Packs/day: 0.50    Years: 40.00    Total pack years: 20.00    Types: Cigarettes   Smokeless tobacco: Never   Tobacco comments:    Patient refused  Vaping Use   Vaping Use: Never used  Substance and Sexual Activity   Alcohol use: Yes   Drug use: Yes    Types: Marijuana   Sexual activity: Never    Birth control/protection: None  Other Topics Concern   Not on file  Social History Narrative   ** Merged History Encounter **       Social Determinants of Health   Financial Resource Strain: Not on file  Food Insecurity: Not on file  Transportation Needs: Not on file  Physical Activity: Not on file  Stress: Not on file  Social Connections: Not on file  Intimate Partner Violence: Not on file    SDOH:  SDOH Screenings   Alcohol Screen: Medium Risk (02/14/2020)   Alcohol Screen    Last Alcohol Screening Score (AUDIT): 26  Depression (PHQ2-9): Low Risk  (09/01/2021)   Depression (PHQ2-9)    PHQ-2 Score: 0  Financial Resource Strain: Not on file  Food Insecurity: Not on file  Housing: Not on file  Physical Activity: Not on file  Social Connections: Not on file  Stress: Not on file  Tobacco Use: High Risk (04/16/2022)   Patient History    Smoking Tobacco Use: Every Day    Smokeless Tobacco Use: Never    Passive Exposure: Not on file  Transportation Needs: Not on file    Last Labs:  Admission on 04/25/2022  Component Date Value Ref Range Status   SARS Coronavirus 2 by RT PCR 04/25/2022 NEGATIVE  NEGATIVE Final   Comment: (NOTE) SARS-CoV-2 target nucleic acids are NOT DETECTED.  The SARS-CoV-2 RNA is generally detectable in upper respiratory specimens during the acute phase of infection. The lowest concentration of SARS-CoV-2 viral copies this assay can detect is 138 copies/mL. A  negative  result does not preclude SARS-Cov-2 infection and should not be used as the sole basis for treatment or other patient management decisions. A negative result may occur with  improper specimen collection/handling, submission of specimen other than nasopharyngeal swab, presence of viral mutation(s) within the areas targeted by this assay, and inadequate number of viral copies(<138 copies/mL). A negative result must be combined with clinical observations, patient history, and epidemiological information. The expected result is Negative.  Fact Sheet for Patients:  EntrepreneurPulse.com.au  Fact Sheet for Healthcare Providers:  IncredibleEmployment.be  This test is no                          t yet approved or cleared by the [place] FDA and  has been authorized for detection and/or diagnosis of SARS-CoV-2 by FDA under an Emergency Use Authorization (EUA). This EUA will remain  in effect (meaning this test can be used) for the duration of the COVID-19 declaration under Section 564(b)(1) of the Act, 21 U.S.C.section 360bbb-3(b)(1), unless the authorization is terminated  or revoked sooner.       Influenza A by PCR 04/25/2022 NEGATIVE  NEGATIVE Final   Influenza B by PCR 04/25/2022 NEGATIVE  NEGATIVE Final   Comment: (NOTE) The Xpert Xpress SARS-CoV-2/FLU/RSV plus assay is intended as an aid in the diagnosis of influenza from Nasopharyngeal swab specimens and should not be used as a sole basis for treatment. Nasal washings and aspirates are unacceptable for Xpert Xpress SARS-CoV-2/FLU/RSV testing.  Fact Sheet for Patients: EntrepreneurPulse.com.au  Fact Sheet for Healthcare Providers: IncredibleEmployment.be  This test is not yet approved or cleared by the [place] FDA and has been authorized for detection and/or diagnosis of SARS-CoV-2 by FDA under an Emergency Use Authorization (EUA). This  EUA will remain in effect (meaning this test can be used) for the duration of the COVID-19 declaration under Section 564(b)(1) of the Act, 21 U.S.C. section 360bbb-3(b)(1), unless the authorization is terminated or revoked.  Performed at [facility], [place] [address], [place], [place] [address]    TSH 04/25/2022 3.275  0.350 - 4.500 uIU/mL Final   Comment: Performed by a 3rd Generation assay with a functional sensitivity of <=0.01 uIU/mL. Performed at [facility], [place] [address], [place], [place] [address]    Alcohol, Ethyl (B) 04/25/2022 146 (H)  <10 mg/dL Final   Comment: (NOTE) Lowest detectable limit for serum alcohol is 10 mg/dL.  For medical purposes only. Performed at [place] [facility], [place] [address] [place], [place] [address]    POC Amphetamine UR 04/26/2022 None Detected  NONE DETECTED (Cut Off Level 1000 ng/mL) Final   POC Secobarbital (BAR) 04/26/2022 None Detected  NONE DETECTED (Cut Off Level 300 ng/mL) Final   POC Buprenorphine (BUP) 04/26/2022 None Detected  NONE DETECTED (Cut Off Level 10 ng/mL) Final   POC Oxazepam (BZO) 04/26/2022 None Detected  NONE DETECTED (Cut Off Level 300 ng/mL) Final   POC Cocaine UR 04/26/2022 Positive (A)  NONE DETECTED (Cut Off Level 300 ng/mL) Final   POC Methamphetamine UR 04/26/2022 None Detected  NONE DETECTED (Cut Off Level 1000 ng/mL) Final   POC Morphine 04/26/2022 None Detected  NONE DETECTED (Cut Off Level 300 ng/mL) Final   POC Methadone UR 04/26/2022 None Detected  NONE DETECTED (Cut Off Level 300 ng/mL) Final   POC Oxycodone UR 04/26/2022 None Detected  NONE DETECTED (Cut Off Level 100 ng/mL) Final   POC Marijuana UR 04/26/2022  Positive (A)  NONE DETECTED (Cut Off Level 50 ng/mL) Final   SARSCOV2ONAVIRUS 2 AG 04/25/2022 NEGATIVE  NEGATIVE Final   Comment: (NOTE) SARS-CoV-2 antigen NOT DETECTED.   Negative results are presumptive.  Negative results do not preclude SARS-CoV-2 infection and should not be used as  the sole basis for treatment or other patient management decisions, including infection  control decisions, particularly in the presence of clinical signs and  symptoms consistent with COVID-19, or in those who have been in contact with the virus.  Negative results must be combined with clinical observations, patient history, and epidemiological information. The expected result is Negative.  Fact Sheet for Patients: HandmadeRecipes.com.cy  Fact Sheet for Healthcare Providers: FuneralLife.at  This test is not yet approved or cleared by the [place] FDA and  has been authorized for detection and/or diagnosis of SARS-CoV-2 by FDA under an Emergency Use Authorization (EUA).  This EUA will remain in effect (meaning this test can be used) for the duration of  the COV                          ID-19 declaration under Section 564(b)(1) of the Act, 21 U.S.C. section 360bbb-3(b)(1), unless the authorization is terminated or revoked sooner.    Admission on 04/15/2022, Discharged on 04/16/2022  Component Date Value Ref Range Status   SARS Coronavirus 2 by RT PCR 04/16/2022 NEGATIVE  NEGATIVE Final   Comment: (NOTE) SARS-CoV-2 target nucleic acids are NOT DETECTED.  The SARS-CoV-2 RNA is generally detectable in upper respiratory specimens during the acute phase of infection. The lowest concentration of SARS-CoV-2 viral copies this assay can detect is 138 copies/mL. A negative result does not preclude SARS-Cov-2 infection and should not be used as the sole basis for treatment or other patient management decisions. A negative result may occur with  improper specimen collection/handling, submission of specimen other than nasopharyngeal swab, presence of viral mutation(s) within the areas targeted by this assay, and inadequate number of viral copies(<138 copies/mL). A negative result must be combined with clinical observations, patient history,  and epidemiological information. The expected result is Negative.  Fact Sheet for Patients:  EntrepreneurPulse.com.au  Fact Sheet for Healthcare Providers:  IncredibleEmployment.be  This test is no                          t yet approved or cleared by the [place] FDA and  has been authorized for detection and/or diagnosis of SARS-CoV-2 by FDA under an Emergency Use Authorization (EUA). This EUA will remain  in effect (meaning this test can be used) for the duration of the COVID-19 declaration under Section 564(b)(1) of the Act, 21 U.S.C.section 360bbb-3(b)(1), unless the authorization is terminated  or revoked sooner.       Influenza A by PCR 04/16/2022 NEGATIVE  NEGATIVE Final   Influenza B by PCR 04/16/2022 NEGATIVE  NEGATIVE Final   Comment: (NOTE) The Xpert Xpress SARS-CoV-2/FLU/RSV plus assay is intended as an aid in the diagnosis of influenza from Nasopharyngeal swab specimens and should not be used as a sole basis for treatment. Nasal washings and aspirates are unacceptable for Xpert Xpress SARS-CoV-2/FLU/RSV testing.  Fact Sheet for Patients: EntrepreneurPulse.com.au  Fact Sheet for Healthcare Providers: IncredibleEmployment.be  This test is not yet approved or cleared by the [place] FDA and has been authorized for detection and/or diagnosis of SARS-CoV-2 by FDA under an Emergency Use Authorization (EUA). This EUA  will remain in effect (meaning this test can be used) for the duration of the COVID-19 declaration under Section 564(b)(1) of the Act, 21 U.S.C. section 360bbb-3(b)(1), unless the authorization is terminated or revoked.  Performed at [facility], [address] [place], [place] [address]    WBC 04/16/2022 5.3  4.0 - 10.5 K/uL Final   RBC 04/16/2022 4.96  4.22 - 5.81 MIL/uL Final   Hemoglobin 04/16/2022 15.2  13.0 - 17.0 g/dL Final   HCT 17/51/0258 44.7  39.0 -  52.0 % Final   MCV 04/16/2022 90.1  80.0 - 100.0 fL Final   MCH 04/16/2022 30.6  26.0 - 34.0 pg Final   MCHC 04/16/2022 34.0  30.0 - 36.0 g/dL Final   RDW 52/77/8242 12.7  11.5 - 15.5 % Final   Platelets 04/16/2022 190  150 - 400 K/uL Final   nRBC 04/16/2022 0.0  0.0 - 0.2 % Final   Neutrophils Relative % 04/16/2022 51  % Final   Neutro Abs 04/16/2022 2.7  1.7 - 7.7 K/uL Final   Lymphocytes Relative 04/16/2022 31  % Final   Lymphs Abs 04/16/2022 1.7  0.7 - 4.0 K/uL Final   Monocytes Relative 04/16/2022 10  % Final   Monocytes Absolute 04/16/2022 0.5  0.1 - 1.0 K/uL Final   Eosinophils Relative 04/16/2022 5  % Final   Eosinophils Absolute 04/16/2022 0.3  0.0 - 0.5 K/uL Final   Basophils Relative 04/16/2022 2  % Final   Basophils Absolute 04/16/2022 0.1  0.0 - 0.1 K/uL Final   Immature Granulocytes 04/16/2022 1  % Final   Abs Immature Granulocytes 04/16/2022 0.03  0.00 - 0.07 K/uL Final   Performed at [facility], [address] [place], [place] [address]   Sodium 04/16/2022 137  135 - 145 mmol/L Final   Potassium 04/16/2022 4.5  3.5 - 5.1 mmol/L Final   Chloride 04/16/2022 104  98 - 111 mmol/L Final   CO2 04/16/2022 27  22 - 32 mmol/L Final   Glucose, Bld 04/16/2022 94  70 - 99 mg/dL Final   Glucose reference range applies only to samples taken after fasting for at least 8 hours.   BUN 04/16/2022 12  8 - 23 mg/dL Final   Creatinine, Ser 04/16/2022 1.08  0.61 - 1.24 mg/dL Final   Calcium 44/31/5400 10.2  8.9 - 10.3 mg/dL Final   Total Protein 86/76/1950 6.4 (L)  6.5 - 8.1 g/dL Final   Albumin 93/26/7124 4.1  3.5 - 5.0 g/dL Final   AST 58/06/9832 22  15 - 41 U/L Final   ALT 04/16/2022 19  0 - 44 U/L Final   Alkaline Phosphatase 04/16/2022 61  38 - 126 U/L Final   Total Bilirubin 04/16/2022 0.7  0.3 - 1.2 mg/dL Final   GFR, Estimated 04/16/2022 >60  >60 mL/min Final   Comment: (NOTE) Calculated using the CKD-EPI Creatinine Equation (2021)    Anion gap 04/16/2022 6  5 - 15  Final   Performed at [facility], [address] [place], [place] [address]   Hgb A1c MFr Bld 04/16/2022 5.6  4.8 - 5.6 % Final   Comment: (NOTE) Pre diabetes:          5.7%-6.4%  Diabetes:              >6.4%  Glycemic control for   <7.0% adults with diabetes    Mean Plasma Glucose 04/16/2022 114.02  mg/dL Final   Performed at  [facility], [place] [address], [place], [place] [address]   Alcohol, Ethyl (B) 04/16/2022 201 (H)  <10 mg/dL Final   Comment: (NOTE) Lowest detectable limit for serum alcohol is 10 mg/dL.  For medical purposes only. Performed at [facility], [place] [address] [place], [place] [address]    TSH 04/16/2022 2.894  0.350 - 4.500 uIU/mL Final   Comment: Performed by a 3rd Generation assay with a functional sensitivity of <=0.01 uIU/mL. Performed at [facility], [place] [address] [place], [place] [address]    POC Amphetamine UR 04/15/2022 None Detected  NONE DETECTED (Cut Off Level 1000 ng/mL) Final   POC Secobarbital (BAR) 04/15/2022 None Detected  NONE DETECTED (Cut Off Level 300 ng/mL) Final   POC Buprenorphine (BUP) 04/15/2022 None Detected  NONE DETECTED (Cut Off Level 10 ng/mL) Final   POC Oxazepam (BZO) 04/15/2022 None Detected  NONE DETECTED (Cut Off Level 300 ng/mL) Final   POC Cocaine UR 04/15/2022 Positive (A)  NONE DETECTED (Cut Off Level 300 ng/mL) Final   POC Methamphetamine UR 04/15/2022 None Detected  NONE DETECTED (Cut Off Level 1000 ng/mL) Final   POC Morphine 04/15/2022 None Detected  NONE DETECTED (Cut Off Level 300 ng/mL) Final   POC Methadone UR 04/15/2022 None Detected  NONE DETECTED (Cut Off Level 300 ng/mL) Final   POC Oxycodone UR 04/15/2022 None Detected  NONE DETECTED (Cut Off Level 100 ng/mL) Final   POC Marijuana UR 04/15/2022 Positive (A)  NONE DETECTED (Cut Off Level 50 ng/mL) Final   SARSCOV2ONAVIRUS 2 AG 04/16/2022 NEGATIVE  NEGATIVE Final   Comment: (NOTE) SARS-CoV-2 antigen NOT DETECTED.   Negative  results are presumptive.  Negative results do not preclude SARS-CoV-2 infection and should not be used as the sole basis for treatment or other patient management decisions, including infection  control decisions, particularly in the presence of clinical signs and  symptoms consistent with COVID-19, or in those who have been in contact with the virus.  Negative results must be combined with clinical observations, patient history, and epidemiological information. The expected result is Negative.  Fact Sheet for Patients: HandmadeRecipes.com.cy  Fact Sheet for Healthcare Providers: FuneralLife.at  This test is not yet approved or cleared by the [place] FDA and  has been authorized for detection and/or diagnosis of SARS-CoV-2 by FDA under an Emergency Use Authorization (EUA).  This EUA will remain in effect (meaning this test can be used) for the duration of  the COV                          ID-19 declaration under Section 564(b)(1) of the Act, 21 U.S.C. section 360bbb-3(b)(1), unless the authorization is terminated or revoked sooner.     SARSCOV2ONAVIRUS 2 AG 04/16/2022 NEGATIVE  NEGATIVE Final   Comment: (NOTE) SARS-CoV-2 antigen NOT DETECTED.   Negative results are presumptive.  Negative results do not preclude SARS-CoV-2 infection and should not be used as the sole basis for treatment or other patient management decisions, including infection  control decisions, particularly in the presence of clinical signs and  symptoms consistent with COVID-19, or in those who have been in contact with the virus.  Negative results must be combined with clinical observations, patient history, and epidemiological information. The expected result is Negative.  Fact Sheet for Patients: HandmadeRecipes.com.cy  Fact Sheet for Healthcare Providers: FuneralLife.at  This test is not yet approved or  cleared by the [place] and  has been authorized for detection and/or diagnosis of SARS-CoV-2 by FDA under an Emergency Use Authorization (EUA).  This EUA will remain in effect (meaning this test can be used) for the duration of  the COV                          ID-19 declaration under Section 564(b)(1) of the Act, 21 U.S.C. section 360bbb-3(b)(1), unless the authorization is terminated or revoked sooner.    Admission on 11/10/2021, Discharged on 11/10/2021  Component Date Value Ref Range Status   Sodium 11/10/2021 136  135 - 145 mmol/L Final   Potassium 11/10/2021 4.3  3.5 - 5.1 mmol/L Final   Chloride 11/10/2021 106  98 - 111 mmol/L Final   CO2 11/10/2021 24  22 - 32 mmol/L Final   Glucose, Bld 11/10/2021 92  70 - 99 mg/dL Final   Glucose reference range applies only to samples taken after fasting for at least 8 hours.   BUN 11/10/2021 16  8 - 23 mg/dL Final   Creatinine, Ser 11/10/2021 0.93  0.61 - 1.24 mg/dL Final   Calcium 97/35/3299 9.9  8.9 - 10.3 mg/dL Final   GFR, Estimated 11/10/2021 >60  >60 mL/min Final   Comment: (NOTE) Calculated using the CKD-EPI Creatinine Equation (2021)    Anion gap 11/10/2021 6  5 - 15 Final   Performed at [facility], [address], [place], [place] [address]   WBC 11/10/2021 5.7  4.0 - 10.5 K/uL Final   RBC 11/10/2021 4.78  4.22 - 5.81 MIL/uL Final   Hemoglobin 11/10/2021 15.5  13.0 - 17.0 g/dL Final   HCT 34/19/6222 43.5  39.0 - 52.0 % Final   MCV 11/10/2021 91.0  80.0 - 100.0 fL Final   MCH 11/10/2021 32.4  26.0 - 34.0 pg Final   MCHC 11/10/2021 35.6  30.0 - 36.0 g/dL Final   RDW 97/98/9211 13.2  11.5 - 15.5 % Final   Platelets 11/10/2021 167  150 - 400 K/uL Final   nRBC 11/10/2021 0.0  0.0 - 0.2 % Final   Neutrophils Relative % 11/10/2021 67  % Final   Neutro Abs 11/10/2021 3.8  1.7 - 7.7 K/uL Final   Lymphocytes Relative 11/10/2021 21  % Final   Lymphs Abs 11/10/2021 1.2  0.7 - 4.0 K/uL Final   Monocytes  Relative 11/10/2021 7  % Final   Monocytes Absolute 11/10/2021 0.4  0.1 - 1.0 K/uL Final   Eosinophils Relative 11/10/2021 4  % Final   Eosinophils Absolute 11/10/2021 0.2  0.0 - 0.5 K/uL Final   Basophils Relative 11/10/2021 1  % Final   Basophils Absolute 11/10/2021 0.1  0.0 - 0.1 K/uL Final   Immature Granulocytes 11/10/2021 0  % Final   Abs Immature Granulocytes 11/10/2021 0.01  0.00 - 0.07 K/uL Final   Performed at [facility], [address], [place], [place] [address]    Allergies: Patient has no known allergies.  PTA Medications: (Not in a hospital admission)   Long Term Goals: Improvement in symptoms so as ready for discharge  Short Term Goals: Patient will verbalize feelings in meetings with treatment team members., Patient will attend at least of 50% of the groups daily., Pt will complete the PHQ9 on admission, day 3 and discharge., Patient will participate in completing the [place] Suicide Severity Rating Scale, Patient will score a low risk of violence for 24 hours prior to discharge, and Patient  will take medications as prescribed daily.  Medical Decision Making  Patient presents to the [facility] with alcohol abuse with intoxication.  He meets the criteria for treatment in the facility based crisis unit.  He is requesting alcohol detox and upon discharge outpatient abuse resources.  Recommendations  Based on my evaluation the patient does not appear to have an emergency medical condition.  Patient meets criteria for treatment in the [facility]  Ativan taper discontinued.  CIWA protocol with Ativan 1 mg every 6 hours for CIWA score greater than 10 initiated.  Meds ordered this encounter   escitalopram (LEXAPRO) tablet 20 mg daily   lidocaine (LIDODERM) 5 % 1 patch q12h PRN shoulder pain.   ibuprofen (ADVIL) tablet 400 mg Q8h PRN pain   hydrOXYzine (VISTARIL) capsule 50 mg TID PRN anxiety   nicotine (NICODERM CQ - dosed in mg/24 hours) patch 21 mg  QD Symbicort BID    Revonda Humphrey, NP 04/26/22  10:24 AM

## 2022-04-26 NOTE — Progress Notes (Signed)
Tim [name] took a nap after lunch, he returned to the dinning room for dinner. He remains calm and cooperative.

## 2022-04-26 NOTE — ED Notes (Signed)
Pt resting quietly with eyes closed.  No pain or discomfort noted/voiced.  Breathing is even and unlabored.  Will continue to monitor for safety.  

## 2022-04-26 NOTE — Plan of Care (Incomplete)
Patient to be transferred from [facility] to [facility]. Please refer to [facility] note for full details.   In brief, Tim [name] is a 66 yo male w/ hx of alcohol use disorder, cocaine use disorder, GAD, MDD, chronic hepatitis C, COPD, right inguinal hernia w/ gangrene s/p repair presents to [facility] and transferred to [facility] for alcohol intoxication seeking alcohol detox and rehab.  Alcohol Use Disorder Last drink 04/25/22  -CIWA w/o ativan -MVI/Thiamine -Naltrexone vs Gabapentin  MDD Anxiety -Continue lexapro 20 mg qd -Continue hydroxyzine 50 mg tid prn  COPD -Continue symbicort 2 puffs bid -Maintain O2 goal >88%

## 2022-04-26 NOTE — ED Notes (Signed)
Tim [name] is a 66 year old male admitted to [facility] due to alcohol intoxication. Patient denies SI, HI, psychosis and drug usage. Patient appears intoxicated. Patient reported consuming alcohol prior to arrival and unable to tell how much he drank.  Patient reported worsening depressive symptoms. Skin check completed now in bed resting.

## 2022-04-26 NOTE — ED Notes (Signed)
Pt sleeping@this  time, but woke up to answer questions for Clinical research associate. Pt does present hard of hearing writer had to speak loudly and slow. He was holding hear and looking at lips while speaking with him. Pt voiced he is not SI,HI or AVH. Will continue to monitor for safety

## 2022-04-26 NOTE — Progress Notes (Signed)
Received Tim [name] on [facility], he was cooperative with the admission process. He was oriented to his new environment and ate lunch. He denied all of the psychiatric symptoms.

## 2022-04-27 ENCOUNTER — Encounter (HOSPITAL_COMMUNITY): Payer: Self-pay

## 2022-04-27 DIAGNOSIS — Z20822 Contact with and (suspected) exposure to covid-19: Secondary | ICD-10-CM | POA: Diagnosis not present

## 2022-04-27 DIAGNOSIS — F10129 Alcohol abuse with intoxication, unspecified: Secondary | ICD-10-CM | POA: Diagnosis not present

## 2022-04-27 LAB — HEPATIC FUNCTION PANEL
ALT: 14 U/L (ref 0–44)
AST: 21 U/L (ref 15–41)
Albumin: 3.5 g/dL (ref 3.5–5.0)
Alkaline Phosphatase: 58 U/L (ref 38–126)
Bilirubin, Direct: 0.2 mg/dL (ref 0.0–0.2)
Indirect Bilirubin: 0.4 mg/dL (ref 0.3–0.9)
Total Bilirubin: 0.6 mg/dL (ref 0.3–1.2)
Total Protein: 5.8 g/dL — ABNORMAL LOW (ref 6.5–8.1)

## 2022-04-27 MED ORDER — NALTREXONE HCL 50 MG PO TABS
50.0000 mg | ORAL_TABLET | Freq: Every day | ORAL | Status: DC
  Administered 2022-04-27 – 2022-04-28 (×2): 50 mg via ORAL
  Filled 2022-04-27: qty 7
  Filled 2022-04-27 (×2): qty 1

## 2022-04-27 MED ORDER — DICLOFENAC SODIUM 1 % EX GEL: 2.0000 g | Freq: Four times a day (QID) | CUTANEOUS | Status: DC | PRN

## 2022-04-27 NOTE — ED Notes (Signed)
Pt in room in no acute distress. Safety maintained.

## 2022-04-27 NOTE — ED Notes (Signed)
Pt received dinner. 

## 2022-04-27 NOTE — ED Notes (Signed)
Pt is in the bed sleeping. Respirations are even and unlabored. No acute distress noted. Will continue to monitor for safety. 

## 2022-04-27 NOTE — ED Notes (Signed)
Pt had breakfast. 

## 2022-04-27 NOTE — BH IP Treatment Plan (Signed)
Interdisciplinary Treatment and Diagnostic Plan Update  04/27/2022 Time of Session: 10:00AM Tim [name] MRN: [id number]  Diagnosis:  Final diagnoses:  Alcohol abuse with intoxication (HCC)  Anxious mood  Behavior concern in adult     Current Medications:  Current Facility-Administered Medications  Medication Dose Route Frequency Provider Last Rate Last Admin   acetaminophen (TYLENOL) tablet 650 mg  650 mg Oral Q6H PRN Sindy Guadeloupe, NP   650 mg at 04/26/22 0239   alum & mag hydroxide-simeth (MAALOX/MYLANTA) 200-200-20 MG/5ML suspension 30 mL  30 mL Oral Q4H PRN Sindy Guadeloupe, NP       diclofenac Sodium (VOLTAREN) 1 % topical gel 2 g  2 g Topical QID PRN Park Pope, MD       escitalopram (LEXAPRO) tablet 20 mg  20 mg Oral Daily Ardis Hughs, NP   20 mg at 04/27/22 9528   hydrOXYzine (VISTARIL) capsule 50 mg  50 mg Oral TID PRN Ardis Hughs, NP       ibuprofen (ADVIL) tablet 400 mg  400 mg Oral Q8H PRN Ardis Hughs, NP       lidocaine (LIDODERM) 5 % 1 patch  1 patch Transdermal Q12H PRN Ardis Hughs, NP       loperamide (IMODIUM) capsule 2-4 mg  2-4 mg Oral PRN Sindy Guadeloupe, NP       magnesium hydroxide (MILK OF MAGNESIA) suspension 30 mL  30 mL Oral Daily PRN Sindy Guadeloupe, NP       mometasone-formoterol (DULERA) 200-5 MCG/ACT inhaler 2 puff  2 puff Inhalation BID Ardis Hughs, NP   2 puff at 04/27/22 0756   multivitamin with minerals tablet 1 tablet  1 tablet Oral Daily Sindy Guadeloupe, NP   1 tablet at 04/27/22 0926   naltrexone (DEPADE) tablet 50 mg  50 mg Oral Daily Park Pope, MD   50 mg at 04/27/22 1107   nicotine (NICODERM CQ - dosed in mg/24 hours) patch 21 mg  21 mg Transdermal Daily Vernard Gambles H, NP   21 mg at 04/27/22 0930   ondansetron (ZOFRAN-ODT) disintegrating tablet 4 mg  4 mg Oral Q6H PRN Sindy Guadeloupe, NP   4 mg at 04/26/22 0239   thiamine tablet 100 mg  100 mg Oral Daily Sindy Guadeloupe, NP   100 mg at 04/27/22 0928   traZODone  (DESYREL) tablet 50 mg  50 mg Oral QHS PRN Sindy Guadeloupe, NP   50 mg at 04/26/22 4132   Current Outpatient Medications  Medication Sig Dispense Refill   escitalopram (LEXAPRO) 20 MG tablet Take 20 mg by mouth daily.     hydrOXYzine (VISTARIL) 50 MG capsule Take 50 mg by mouth 3 (three) times daily as needed for anxiety.     ibuprofen (ADVIL) 200 MG tablet Take 400 mg by mouth every 8 (eight) hours as needed for moderate pain.     SYMBICORT 160-4.5 MCG/ACT inhaler 2 puffs 2 (two) times daily.     PTA Medications: Prior to Admission medications   Medication Sig Start Date End Date Taking? Authorizing Provider  escitalopram (LEXAPRO) 20 MG tablet Take 20 mg by mouth daily. 01/10/22  Yes [provider]  hydrOXYzine (VISTARIL) 50 MG capsule Take 50 mg by mouth 3 (three) times daily as needed for anxiety. 01/10/22  Yes [provider]  ibuprofen (ADVIL) 200 MG tablet Take 400 mg by mouth every 8 (eight) hours as needed for moderate pain.   Yes [provider]  SYMBICORT 160-4.5  MCG/ACT inhaler 2 puffs 2 (two) times daily. 06/01/21  Yes [provider]    Patient Stressors: Medication change or noncompliance   Substance abuse    Patient Strengths: Motivation for treatment/growth   Treatment Modalities: Medication Management, Group therapy, Case management,  1 to 1 session with clinician, Psychoeducation, Recreational therapy.   Physician Treatment Plan for Primary and Secondary Diagnosis:  Final diagnoses:  Alcohol abuse with intoxication (HCC)  Anxious mood  Behavior concern in adult   Long Term Goal(s): Improvement in symptoms so as ready for discharge  Short Term Goals: Patient will verbalize feelings in meetings with treatment team members. Patient will attend at least of 50% of the groups daily. Pt will complete the PHQ9 on admission, day 3 and discharge. Patient will participate in completing the [place] Suicide Severity Rating Scale Patient  will score a low risk of violence for 24 hours prior to discharge Patient will take medications as prescribed daily.  Medication Management: Evaluate patient's response, side effects, and tolerance of medication regimen.  Therapeutic Interventions: 1 to 1 sessions, Unit Group sessions and Medication administration.  Evaluation of Outcomes: Adequate for Discharge  LCSW Treatment Plan for Primary Diagnosis:  Final diagnoses:  Alcohol abuse with intoxication (HCC)  Anxious mood  Behavior concern in adult    Long Term Goal(s): Safe transition to appropriate next level of care at discharge.  Short Term Goals: Facilitate acceptance of mental health diagnosis and concerns through verbal commitment to aftercare plan and appointments at discharge. and Identify minimum of 2 triggers associated with mental health/substance abuse issues with treatment team members.  Therapeutic Interventions: Assess for all discharge needs, 1 to 1 time with Child psychotherapist, Explore available resources and support systems, Assess for adequacy in community support network, Educate family and significant other(s) on suicide prevention, Complete Psychosocial Assessment, Interpersonal group therapy.  Evaluation of Outcomes: Adequate for Discharge   Progress in Treatment: Attending groups: Yes. Participating in groups: Yes. Taking medication as prescribed: Yes. Toleration medication: Yes. Family/Significant other contact made: No, will contact:  no one at this time Patient understands diagnosis: Yes. Discussing patient identified problems/goals with staff: Yes. Medical problems stabilized or resolved: Yes. Denies suicidal/homicidal ideation: Yes. Issues/concerns per patient self-inventory: No. Other: None   New problem(s) identified: No, Describe:  None  New Short Term/Long Term Goal(s): Rambo reports his long-term goal is to cease drinking alcohol and focus on taking care of his home and other  responsibilities.  Patient Goals:  "I needed to get my head together"  Discharge Plan or Barriers: Viraaj reports he plans to return home with outpatient substance abuse treatment/counseling services. Gerome Apley shared that his brother will pick him up when he is ready for discharge.  Reason for Continuation of Hospitalization: Medication stabilization  Estimated Length of Stay: Discharge, Thursday 04/28/22  Last 3 [place] Suicide Severity Risk Score: Flowsheet Row ED from 04/25/2022 in [facility] ED from 04/15/2022 in [facility] ED from 03/29/2022 in [facility]  C-SSRS RISK CATEGORY High Risk High Risk No Risk       Last PHQ 2/9 Scores:    04/27/2022   11:17 AM 09/01/2021    6:34 PM  Depression screen PHQ 2/9  Decreased Interest 3 0  Down, Depressed, Hopeless 3 0  PHQ - 2 Score 6 0  Altered sleeping 3   Tired, decreased energy 3   Change in appetite 3   Feeling bad or failure about yourself  3   Trouble concentrating 3   Moving slowly or fidgety/restless 3   Suicidal thoughts 0   PHQ-9 Score 24     Scribe for Treatment Team: Maeola Sarah, [place] 04/27/2022 3:40 PM

## 2022-04-27 NOTE — ED Notes (Signed)
Pt received lunch 

## 2022-04-27 NOTE — ED Notes (Signed)
Patient A&Ox4. Denies intent to harm self/others when asked. Denies A/VH. Patient denies any physical complaints when asked. Denies withdrawal sx from ETOH. Pt states, "I slept pretty good last night". No acute distress noted. Routine safety checks conducted according to facility protocol. Encouraged patient to notify staff if thoughts of harm toward self or others arise. Patient verbalize understanding and agreement. Will continue to monitor for safety.

## 2022-04-27 NOTE — ED Provider Notes (Signed)
Behavioral Health Progress Note  Date and Time: 04/27/2022 3:23 PM Name: Tim [name] MRN:  [id number]  Subjective:   Tim [name] is a 66 yo male w/ hx of alcohol use disorder, cocaine use disorder, GAD, MDD, chronic hepatitis C, COPD, right inguinal hernia w/ gangrene s/p repair presents to [facility] and transferred to [facility] for alcohol intoxication seeking alcohol detox and outpatient rehab resources.  Seen and assessed at bedside.  Denies any acute complaints at this time.  Denying any acute alcohol withdrawal symptoms including nausea/vomiting, tremors, headache, diarrhea.  Reports mood has been improving and feeling less anxious.  Slept well and ate appropriately.  Denies SI/HI/AVH.  Discussed disposition with patient and patient is amenable to discharging tomorrow with outpatient alcohol rehabilitation resources.  Also amenable to starting naltrexone 50 mg every day to help with alcohol dependence.  Requesting no further changes to psychotropic medications at this time    Diagnosis:  Final diagnoses:  Alcohol abuse with intoxication (HCC)  Anxious mood  Behavior concern in adult    Total Time spent with patient: 1 hour   Labs  Lab Results:     Latest Ref Rng & Units 04/16/2022   12:00 AM 11/10/2021    7:47 PM 10/09/2021    6:21 PM  CBC  WBC 4.0 - 10.5 K/uL 5.3  5.7  10.4   Hemoglobin 13.0 - 17.0 g/dL 37.1  06.2  69.4   Hematocrit 39.0 - 52.0 % 44.7  43.5  51.0   Platelets 150 - 400 K/uL 190  167  210       Latest Ref Rng & Units 04/27/2022   10:51 AM 04/16/2022   12:00 AM 11/10/2021    7:47 PM  CMP  Glucose 70 - 99 mg/dL  94  92   BUN 8 - 23 mg/dL  12  16   Creatinine 8.54 - 1.24 mg/dL  6.27  0.35   Sodium 009 - 145 mmol/L  137  136   Potassium 3.5 - 5.1 mmol/L  4.5  4.3   Chloride 98 - 111 mmol/L  104  106   CO2 22 - 32 mmol/L  27  24   Calcium 8.9 - 10.3 mg/dL  38.1  9.9   Total Protein 6.5 - 8.1 g/dL 5.8  6.4    Total Bilirubin 0.3 - 1.2 mg/dL 0.6  0.7     Alkaline Phos 38 - 126 U/L 58  61    AST 15 - 41 U/L 21  22    ALT 0 - 44 U/L 14  19      Physical Findings   AIMS    Flowsheet Row [facility] Ad[facility] (Discharged) [facility] Admission (Discharged) from OP [facility] Admission (Discharged) [facility] Admission (Discharged) [facility]  AIMS Total Score 0 0 0 0 0      AUDIT    Flowsheet Row Admission (Discharged) from 02/14/2020 [facility] Admission (Discharged) [facility] Admission (Discharged) from [facility]20[facility] Admission (Discharged) from [facility]21[facility] Admission (Discharged) from [facility]  Alcohol Use Disorder Identification Test Final Score (AUDIT) 26 19 20  28  19      PHQ2-9    Flowsheet Row ED from 04/25/2022 in [facility] ED from 09/01/2021 in [facility]  PHQ-2 Total Score 6 0  PHQ-9 Total Score 24 --      Flowsheet Row ED from 04/25/2022 in [facility] ED from 04/15/2022 in [facility] ED from 03/29/2022 in [facility]  C-SSRS RISK CATEGORY High Risk High Risk No Risk        Musculoskeletal  Strength & Muscle Tone: within normal limits Gait & Station: normal Patient leans: N/A  Psychiatric Specialty Exam  Presentation  General Appearance: Disheveled   Eye Contact:Fair   Speech:Clear and Coherent; Garbled (garbled at times)   Speech  Volume:Normal   Handedness:Right    Mood and Affect  Mood:Anxious; Depressed   Affect:Congruent    Thought Process  Thought Processes:Coherent   Descriptions of Associations:Intact   Orientation:Full (Time, Place and Person)   Thought Content:Logical   Diagnosis of Schizophrenia or Schizoaffective disorder in past: No     Hallucinations:Hallucinations: None   Ideas of Reference:None   Suicidal Thoughts:Suicidal Thoughts: No   Homicidal Thoughts:Homicidal Thoughts: No    Sensorium  Memory:Immediate Good; Recent Good; Remote Good   Judgment:Fair   Insight:Fair    Executive Functions  Concentration:Good   Attention Span:Good   Recall:Good   Fund of Knowledge:Fair   Language:Good    Psychomotor Activity  Psychomotor Activity:Psychomotor Activity: Normal    Assets  Assets:Communication Skills; Desire for Improvement; Financial Resources/Insurance; Housing; Resilience; Physical Health    Sleep  Sleep:Sleep: Fair    Physical Exam  Physical Exam Vitals and nursing note reviewed.  Constitutional:      General: He is not in acute distress.    Appearance: He is well-developed.  HENT:     Head: Normocephalic and atraumatic.  Eyes:     Conjunctiva/sclera: Conjunctivae normal.  Cardiovascular:     Rate and Rhythm: Normal rate and regular rhythm.     Heart sounds: No murmur heard. Pulmonary:     Effort: Pulmonary effort is normal. No respiratory distress.     Breath sounds: Normal breath sounds.  Abdominal:     Palpations: Abdomen is soft.     Tenderness: There is no abdominal tenderness.  Musculoskeletal:        General: No swelling.     Cervical back: Neck supple.  Skin:    General: Skin is warm and dry.     Capillary Refill: Capillary refill takes less than 2 seconds.  Neurological:     Mental Status: He is alert.  Psychiatric:        Mood and Affect: Mood normal.    Review of Systems  Respiratory:  Negative for  shortness of breath.   Cardiovascular:  Negative for chest pain.  Gastrointestinal:  Negative for abdominal pain, constipation, diarrhea, heartburn, nausea and vomiting.  Neurological:  Negative for headaches.   Blood pressure 110/70, pulse 66, temperature 98 F (36.7 C), temperature source Temporal, resp. rate 18, weight 156 lb (70.8 kg), SpO2 100 %. Body mass index is 20.03 kg/m.  ASSESSMENT Tim [name] is a 66 yo male w/ hx of alcohol use disorder, cocaine use disorder, GAD, MDD, chronic hepatitis C, COPD, right inguinal hernia c/b gangrene s/p repair presents to [facility] and transferred to [facility] for alcohol intoxication seeking alcohol detox and outpatient rehab resources.  PLAN Alcohol Use Disorder Cocaine use disorder  Cannabis use disorder Alcohol intoxication -CIWA without Ativan -MVI/thiamine -Naltrexone 50 mg daily -Encourage cessation from all substance use  Chronic Hepatitis C Virus Untreated.  Does not appear to be fluid overloaded and does not appear to have ascites.  I am mildly concerned about liver malignancy given recent unintentional 25 pound weight loss in past month.  In addition, recent CT abdomen/pelvis in January 2023 showed fatty liver infiltration and possible 7 mm low-density nodular enhancement in the inferior right lobe as well as splenomegaly -Outpatient PCP/ID follow-up -Outpatient abdominal ultrasound or MRI may be warranted -HCV quant -HCV fibrosure  MDD GAD -Continue Lexapro 20 mg daily -Continue hydroxyzine 50 milligrams 3 times daily as needed  R Shoulder Pain Has had multiple shoulder x-rays including most recently June 2023 which only showed mild degenerative changes -Voltaren gel  -Lidocaine patch  Hx of COPD Unclear if has had actual outpatient PFTs.  -O2 sat goal >88% -Dulera 2 puff bid  Right inguinal hernia s/p repair -Tylenol 650 mg qid prn  Dispo: Discharge home tomorrow   Park Pope, MD 04/27/2022 3:23 PM

## 2022-04-27 NOTE — ED Notes (Signed)
Pt in dayroom, finished dinner in no acute distress. MHT reported pt having a BP 98/56. BP taken manually. BP98/58. Pt denies physical discomfort. Encouraged pt to drink fluids. Informed pt to notify staff immediately with any discomfort or concerns. MD made aware. Will continue to monitor for safety.

## 2022-04-27 NOTE — ED Notes (Signed)
Staff informed RN pt Bp was reading low.

## 2022-04-27 NOTE — ED Notes (Signed)
Pt sleeping@this time. Breathing even and unlabored. Will continue to monitor for safety 

## 2022-04-27 NOTE — ED Notes (Signed)
Blood drawn x 1 stick from R AC. Pt tolerated well. DASH courier called to transport specimen to [facility] lab.

## 2022-04-27 NOTE — ED Notes (Signed)
Pt is sleeping@this time. Breathing even and unlabored. Will continue to monitor for safety 

## 2022-04-27 NOTE — ED Notes (Signed)
Patient is alert and oriented x 4. Patient denies s/s of alcohol detox. Patient compliant with medications now in bd resting quietly with eyes closed, Respirations equal and unlabored, skin warm and dry, NAD. Routine safety checks conducted according to facility protocol. Will continue to monitor for safety.

## 2022-04-27 NOTE — Clinical Social Work Psych Note (Signed)
LCSW Update Note   Tim [name] reports feeling "good" today. Tim [name] reports that he slept well last night. Tim [name] denied having any SI, HI or AVH at this time.   Tim [name] reports he plans to return home with outpatient substance abuse treatment/counseling services. Tim [name] shared that his brother will pick him up when he is ready for discharge.   Tim [name] is tentatively scheduled for discharge tomorrow (Thursday, 04/28/22).   Tim [name] denied having any additional questions or concerns at this time.    LCSW will continue to follow.   Tim [name], MSW, LCSW Clinical Child psychotherapist (Facility Based Crisis) [facility]

## 2022-04-27 NOTE — Group Note (Signed)
Group Topic: Change and Accountability  Group Date: 04/27/2022 Start Time: 1100 End Time: 1130 Facilitators: Asher Muir  Department: [facility]  Number of Participants: 4  Group Focus: social skills Treatment Modality:  Solution-Focused Therapy Interventions utilized were exploration Purpose: explore maladaptive thinking All pt were engaged in group session and was attentive in the group discussion pt thanked staff for conducting group. Staff went over MHT material that staff also gave pt to work on their own time as long.  Name: Tim [name] Date of Birth: February 01, 1956  MR: [id number]    Level of Participation: active Quality of Participation: attentive Interactions with others: gave feedback Mood/Affect: appropriate Triggers (if applicable): N/A Cognition: goal directed Progress: Moderate Response: PT was active in listening during group session Plan: patient will be encouraged to continue to work on self.  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/2018   Cocaine abuse with cocaine-induced mood disorder (HCC) 01/29/2018   Polysubstance dependence (HCC) 01/29/2018   Severe recurrent major depression without psychotic features (HCC) 08/20/2017   Severe episode of recurrent major depressive disorder, without psychotic features (HCC) 07/08/2016   MDD (major depressive disorder), recurrent severe, without psychosis (HCC) 05/23/2016   Hepatitis C 05/23/2016   Alcohol dependence with uncomplicated intoxication (HCC) 11/05/2015   Depression, major, recurrent, moderate (HCC) 11/05/2015   GAD (generalized anxiety  disorder) 11/05/2015   Cellulitis of right lower extremity 03/29/2015

## 2022-04-28 DIAGNOSIS — Z20822 Contact with and (suspected) exposure to covid-19: Secondary | ICD-10-CM | POA: Diagnosis not present

## 2022-04-28 DIAGNOSIS — F10129 Alcohol abuse with intoxication, unspecified: Secondary | ICD-10-CM | POA: Diagnosis not present

## 2022-04-28 LAB — AFP TUMOR MARKER: AFP, Serum, Tumor Marker: 2.1 ng/mL (ref 0.0–8.4)

## 2022-04-28 LAB — HCV RNA QUANT: HCV Quantitative: NOT DETECTED IU/mL (ref >50)

## 2022-04-28 MED ORDER — ESCITALOPRAM OXALATE 20 MG PO TABS: 20.0000 mg | ORAL_TABLET | Freq: Every day | ORAL | 0 refills | Status: AC

## 2022-04-28 MED ORDER — HYDROXYZINE PAMOATE 50 MG PO CAPS: 50.0000 mg | ORAL_CAPSULE | Freq: Three times a day (TID) | ORAL | 0 refills | Status: DC | PRN

## 2022-04-28 MED ORDER — NICOTINE 21 MG/24HR TD PT24: 21.0000 mg | MEDICATED_PATCH | Freq: Every day | TRANSDERMAL | 0 refills | Status: DC

## 2022-04-28 MED ORDER — NALTREXONE HCL 50 MG PO TABS: 50.0000 mg | ORAL_TABLET | Freq: Every day | ORAL | 0 refills | Status: AC

## 2022-04-28 NOTE — Discharge Instructions (Signed)
Dear Tim [name],  It was a pleasure taking care of you [facility].  You were admitted due to alcohol intoxication and wanting to detox off alcohol.  Please continue take your medications as prescribed.  We have started you on a medication to help with your alcohol dependence (naltrexone).  Please continue take this as prescribed.  Please continue to continue to take all your other medications as prescribed.  Please follow-up with your primary care doctor in 1 to 2 weeks for a hospital follow-up visit as well as to get referred for treatment of your hepatitis C.  If you have worsening depression or anxiety or has thoughts of wanting hurt yourself or others, please return to [facility] for reevaluation or go to any ED, or call 911 or 988 (the suicide hotline).  Take care! -Dr. Hazle Quant

## 2022-04-28 NOTE — ED Notes (Signed)
Pt sleeping@this time. Breathign even and unlabored. Will continue to monitor for safety 

## 2022-04-28 NOTE — ED Provider Notes (Signed)
[facility]/OBS ASAP Discharge Summary  Date and Time: 04/28/2022 9:57 AM  Name: Tim [name]  MRN:  [id number]   Discharge Diagnoses:  Final diagnoses:  Alcohol abuse with intoxication (HCC)  Anxious mood  Behavior concern in adult    Subjective:  Patient seen and assessed at bedside.  Denies any acute concerns at this time.  Feels ready to go home and wants to see his dog and take care of his trailer.  Discussed that he needs to follow-up with primary care doctor regarding management of his hepatitis C (unclear whether treated) and unintentional weight loss.  Stay Summary by Problem List Tim [name] is a 66 yo male w/ hx of alcohol use disorder, cocaine use disorder, GAD, MDD, chronic hepatitis C, COPD, right inguinal hernia w/ gangrene s/p repair presents to [facility] and transferred to [facility] for alcohol intoxication seeking alcohol detox and outpatient rehab resources. Hospital course is detailed below:  Alcohol Use Disorder Cocaine use disorder Cannabis use disorder Alcohol intoxication Nicotine use disorder Patient presented intoxicated as well as UDS positive for marijuana and cocaine.  Patient was placed on CIWA protocol upon arrival to his [facility].  Patient CIWA score have rapidly downtrended and were 0 for 24 hours prior to discharge.  Patient was started on multivitamin thiamine for vitamin supplementation.  Patient was amenable to starting naltrexone 50 mg daily to help with alcohol dependence.  Reports he will attempt to refrain from further alcohol and cocaine use.  Chronic Hepatitis C Virus Unclear whether that he has been treated or not.  Appears that at some point his HCV quant was 0 in 2017 but this was repeated to verify.  FibroSure as well as HCV quant are still pending and will follow-up with patient once they have resulted.  Mild concern for malignancy given patient has had January 2023 CT abdomen/pelvis that showed fatty liver infiltration as well as a 7 mm low-density nodular  enhancement in the inferior right lobe of the liver as well as recent 25 pound unintentional weight loss.  Does not appear to have signs of liver cirrhosis or ascites at this time  Other chronic conditions were medically managed with home medications and formulary alternatives as necessary (MDD, GAD, COPD, r inguinal hernia)  PCP Follow-up Recommendations: -PFT if not already performed to confirm COPD -Assess if R inguinal hernia requires further repair -Assess if naltrexone helping with alcohol dependence -Assess for further alcohol, marijuana, and cocaine use and outpatient resources to help with alcohol cessation   Total Time spent with patient: 45 minutes  Past Psychiatric History: Alcohol use disorder, cocaine use disorder, cannabis use disorder, MDD, GAD  Past Medical History:  Past Medical History:  Diagnosis Date   Alcoholism (HCC)    Anxiety    COPD (chronic obstructive pulmonary disease) (HCC)    Depression    Hep C w/o coma, chronic (HCC)    History of hiatal hernia    hernia rt groin   Mental health disorder    Scoliosis    Thyroid disease     Past Surgical History:  Procedure Laterality Date   INGUINAL HERNIA REPAIR Right 10/09/2021   Procedure: HERNIA REPAIR INGUINAL INCARCERATED;  Surgeon: Harriette Bouillon, MD;  Location: [facility];  Service: General;  Laterality: Right;   SKIN CANCER EXCISION     THYROID SURGERY     Family History:  Family History  Problem Relation Age of Onset   Dementia Mother    Family Psychiatric History: see above Social  History:  Social History   Substance and Sexual Activity  Alcohol Use Yes     Social History   Substance and Sexual Activity  Drug Use Yes   Types: Marijuana    Social History   Socioeconomic History   Marital status: Single    Spouse name: Not on file   Number of children: Not on file   Years of education: Not on file   Highest education level: Not on file  Occupational History   Not on file  Tobacco  Use   Smoking status: Every Day    Packs/day: 0.50    Years: 40.00    Total pack years: 20.00    Types: Cigarettes   Smokeless tobacco: Never   Tobacco comments:    Patient refused  Vaping Use   Vaping Use: Never used  Substance and Sexual Activity   Alcohol use: Yes   Drug use: Yes    Types: Marijuana   Sexual activity: Never    Birth control/protection: None  Other Topics Concern   Not on file  Social History Narrative   ** Merged History Encounter **       Social Determinants of Health   Financial Resource Strain: Not on file  Food Insecurity: Not on file  Transportation Needs: Not on file  Physical Activity: Not on file  Stress: Not on file  Social Connections: Not on file   SDOH:  SDOH Screenings   Alcohol Screen: Medium Risk (02/14/2020)   Alcohol Screen    Last Alcohol Screening Score (AUDIT): 26  Depression (PHQ2-9): Medium Risk (04/27/2022)   Depression (PHQ2-9)    PHQ-2 Score: 24  Financial Resource Strain: Not on file  Food Insecurity: Not on file  Housing: Not on file  Physical Activity: Not on file  Social Connections: Not on file  Stress: Not on file  Tobacco Use: High Risk (04/16/2022)   Patient History    Smoking Tobacco Use: Every Day    Smokeless Tobacco Use: Never    Passive Exposure: Not on file  Transportation Needs: Not on file    Tobacco Cessation:  A prescription for an FDA-approved tobacco cessation medication provided at discharge  Current Medications:  Current Facility-Administered Medications  Medication Dose Route Frequency Provider Last Rate Last Admin   acetaminophen (TYLENOL) tablet 650 mg  650 mg Oral Q6H PRN Sindy Guadeloupe, NP   650 mg at 04/26/22 0239   alum & mag hydroxide-simeth (MAALOX/MYLANTA) 200-200-20 MG/5ML suspension 30 mL  30 mL Oral Q4H PRN Sindy Guadeloupe, NP       diclofenac Sodium (VOLTAREN) 1 % topical gel 2 g  2 g Topical QID PRN Park Pope, MD       escitalopram (LEXAPRO) tablet 20 mg  20 mg Oral Daily  Ardis Hughs, NP   20 mg at 04/27/22 0925   hydrOXYzine (VISTARIL) capsule 50 mg  50 mg Oral TID PRN Ardis Hughs, NP       ibuprofen (ADVIL) tablet 400 mg  400 mg Oral Q8H PRN Ardis Hughs, NP       lidocaine (LIDODERM) 5 % 1 patch  1 patch Transdermal Q12H PRN Ardis Hughs, NP       loperamide (IMODIUM) capsule 2-4 mg  2-4 mg Oral PRN Sindy Guadeloupe, NP       magnesium hydroxide (MILK OF MAGNESIA) suspension 30 mL  30 mL Oral Daily PRN Sindy Guadeloupe, NP       mometasone-formoterol (DULERA) 200-5 MCG/ACT inhaler  2 puff  2 puff Inhalation BID Ardis Hughs, NP   2 puff at 04/28/22 0800   multivitamin with minerals tablet 1 tablet  1 tablet Oral Daily Sindy Guadeloupe, NP   1 tablet at 04/27/22 0926   naltrexone (DEPADE) tablet 50 mg  50 mg Oral Daily Park Pope, MD   50 mg at 04/27/22 1107   nicotine (NICODERM CQ - dosed in mg/24 hours) patch 21 mg  21 mg Transdermal Daily Vernard Gambles H, NP   21 mg at 04/27/22 0930   ondansetron (ZOFRAN-ODT) disintegrating tablet 4 mg  4 mg Oral Q6H PRN Sindy Guadeloupe, NP   4 mg at 04/26/22 0239   thiamine tablet 100 mg  100 mg Oral Daily Sindy Guadeloupe, NP   100 mg at 04/27/22 8338   traZODone (DESYREL) tablet 50 mg  50 mg Oral QHS PRN Sindy Guadeloupe, NP   50 mg at 04/27/22 2042   Current Outpatient Medications  Medication Sig Dispense Refill   ibuprofen (ADVIL) 200 MG tablet Take 400 mg by mouth every 8 (eight) hours as needed for moderate pain.     SYMBICORT 160-4.5 MCG/ACT inhaler 2 puffs 2 (two) times daily.     escitalopram (LEXAPRO) 20 MG tablet Take 1 tablet (20 mg total) by mouth daily. 30 tablet 0   hydrOXYzine (VISTARIL) 50 MG capsule Take 1 capsule (50 mg total) by mouth 3 (three) times daily as needed for anxiety. 60 capsule 0   naltrexone (DEPADE) 50 MG tablet Take 1 tablet (50 mg total) by mouth daily. 30 tablet 0   nicotine (NICODERM CQ - DOSED IN MG/24 HOURS) 21 mg/24hr patch Place 1 patch (21 mg total) onto the skin  daily. 28 patch 0    PTA Medications: (Not in a hospital admission)       04/27/2022   11:17 AM 09/01/2021    6:34 PM  Depression screen PHQ 2/9  Decreased Interest 3 0  Down, Depressed, Hopeless 3 0  PHQ - 2 Score 6 0  Altered sleeping 3   Tired, decreased energy 3   Change in appetite 3   Feeling bad or failure about yourself  3   Trouble concentrating 3   Moving slowly or fidgety/restless 3   Suicidal thoughts 0   PHQ-9 Score 24     Flowsheet Row ED from 04/25/2022 in [facility] ED from 04/15/2022 in [facility] ED from 03/29/2022 in [facility]  C-SSRS RISK CATEGORY High Risk High Risk No Risk       Musculoskeletal  Strength & Muscle Tone: within normal limits Gait & Station: normal Patient leans: N/A  Psychiatric Specialty Exam  Presentation  General Appearance: Disheveled   Eye Contact:Fair   Speech:Clear and Coherent; Garbled (garbled at times)   Speech Volume:Normal   Handedness:Right    Mood and Affect  Mood:Anxious; Depressed   Affect:Congruent    Thought Process  Thought Processes:Coherent   Descriptions of Associations:Intact   Orientation:Full (Time, Place and Person)   Thought Content:Logical      Hallucinations:No data recorded  Ideas of Reference:None   Suicidal Thoughts:No data recorded  Homicidal Thoughts:No data recorded   Sensorium  Memory:Immediate Good; Recent Good; Remote Good   Judgment:Fair   Insight:Fair    Executive Functions  Concentration:Good   Attention Span:Good   Recall:Good   Fund of Knowledge:Fair   Language:Good    Psychomotor Activity  Psychomotor Activity:No data recorded  Assets  Assets:Communication Skills; Desire for Improvement; Financial Resources/Insurance; Housing; Resilience; Physical Health    Sleep  Sleep:No data recorded   No data recorded   Physical  Exam  Physical Exam Vitals and nursing note reviewed.  Constitutional:      General: He is not in acute distress.    Appearance: He is well-developed.  HENT:     Head: Normocephalic and atraumatic.  Eyes:     Conjunctiva/sclera: Conjunctivae normal.  Cardiovascular:     Rate and Rhythm: Normal rate and regular rhythm.     Heart sounds: No murmur heard. Pulmonary:     Effort: Pulmonary effort is normal. No respiratory distress.     Breath sounds: Normal breath sounds.  Abdominal:     Palpations: Abdomen is soft.     Tenderness: There is no abdominal tenderness.  Musculoskeletal:        General: No swelling.     Cervical back: Neck supple.  Skin:    General: Skin is warm and dry.     Capillary Refill: Capillary refill takes less than 2 seconds.     Comments: Multiple discolorations in forearms bilaterally secondary to prior per parasuicidal behavior of putting out cigarette on forearms  Neurological:     Mental Status: He is alert.  Psychiatric:        Mood and Affect: Mood normal.    Review of Systems  Respiratory:  Negative for shortness of breath.   Cardiovascular:  Negative for chest pain.  Gastrointestinal:  Negative for abdominal pain, constipation, diarrhea, heartburn, nausea and vomiting.  Neurological:  Negative for headaches.   Blood pressure 98/68, pulse 61, temperature 98.1 F (36.7 C), temperature source Oral, resp. rate 16, weight 156 lb (70.8 kg), SpO2 96 %. Body mass index is 20.03 kg/m.  Demographic Factors:  Male, Caucasian, Living alone, and Unemployed  Loss Factors: Financial problems/change in socioeconomic status  Historical Factors: Impulsivity  Risk Reduction Factors:   Positive therapeutic relationship and Positive coping skills or problem solving skills, has a 6 year old dog  Continued Clinical Symptoms:  Alcohol/Substance Abuse/Dependencies Previous Psychiatric Diagnoses and Treatments  Cognitive Features That Contribute To Risk:   None    Suicide Risk:  Mild:  Suicidal ideation of limited frequency, intensity, duration, and specificity.  There are no identifiable plans, no associated intent, mild dysphoria and related symptoms, good self-control (both objective and subjective assessment), few other risk factors, and identifiable protective factors, including available and accessible social support.  Plan Of Care/Follow-up recommendations:   Follow-up recommendations:   Activity:  as tolerated Diet:  heart healthy   Comments:  Prescriptions were given at discharge.  Patient is agreeable with the discharge plan.  Patient was given an opportunity to ask questions.  Patient appears to feel comfortable with discharge and denies any current suicidal or homicidal thoughts.    Patient is instructed prior to discharge to: Take all medications as prescribed by mental healthcare provider. Report any adverse effects and or reactions from the medicines to outpatient provider promptly. In the event of worsening symptoms, patient is instructed to call the crisis hotline, 911 and or go to the nearest ED for appropriate evaluation and treatment of symptoms. Patient is to follow-up with primary care provider for other medical issues, concerns and or health care needs.   Dispo: home  Park Pope, MD 04/28/2022, 9:57 AM

## 2022-04-28 NOTE — ED Notes (Signed)
Patient remains asleep in bed without distress or complaint.  Will monitor. 

## 2022-04-29 LAB — HCV FIBROSURE
ALPHA 2-MACROGLOBULINS, QN: 131 mg/dL (ref 110–276)
ALT (SGPT) P5P: 14 IU/L (ref 0–55)
Apolipoprotein A-1: 162 mg/dL (ref 101–178)
Bilirubin, Total: 0.3 mg/dL (ref 0.0–1.2)
Fibrosis Score: 0.11 (ref 0.00–0.21)
GGT: 19 IU/L (ref 0–65)
Haptoglobin: 69 mg/dL (ref 32–363)
Necroinflammat Activity Score: 0.03 (ref 0.00–0.17)

## 2022-06-09 ENCOUNTER — Ambulatory Visit (HOSPITAL_COMMUNITY)
Admission: EM | Admit: 2022-06-09 | Discharge: 2022-06-10 | Disposition: A | Discharge status: Home / Self Care | Payer: Medicaid Other | Attending: Psychiatry | Admitting: Psychiatry

## 2022-06-09 DIAGNOSIS — F10129 Alcohol abuse with intoxication, unspecified: Secondary | ICD-10-CM | POA: Insufficient documentation

## 2022-06-09 DIAGNOSIS — Z20822 Contact with and (suspected) exposure to covid-19: Secondary | ICD-10-CM | POA: Insufficient documentation

## 2022-06-09 DIAGNOSIS — F1290 Cannabis use, unspecified, uncomplicated: Secondary | ICD-10-CM | POA: Insufficient documentation

## 2022-06-09 LAB — CBC WITH DIFFERENTIAL/PLATELET
Abs Immature Granulocytes: 0.01 10*3/uL (ref 0.00–0.07)
Basophils Absolute: 0.1 10*3/uL (ref 0.0–0.1)
Basophils Relative: 2 %
Eosinophils Absolute: 0.3 10*3/uL (ref 0.0–0.5)
Eosinophils Relative: 6 %
HCT: 37.4 % — ABNORMAL LOW (ref 39.0–52.0)
Hemoglobin: 13.1 g/dL (ref 13.0–17.0)
Immature Granulocytes: 0 %
Lymphocytes Relative: 34 %
Lymphs Abs: 1.5 10*3/uL (ref 0.7–4.0)
MCH: 31.9 pg (ref 26.0–34.0)
MCHC: 35 g/dL (ref 30.0–36.0)
MCV: 91 fL (ref 80.0–100.0)
Monocytes Absolute: 0.3 10*3/uL (ref 0.1–1.0)
Monocytes Relative: 7 %
Neutro Abs: 2.3 10*3/uL (ref 1.7–7.7)
Neutrophils Relative %: 51 %
Platelets: 222 10*3/uL (ref 150–400)
RBC: 4.11 MIL/uL — ABNORMAL LOW (ref 4.22–5.81)
RDW: 14 % (ref 11.5–15.5)
WBC: 4.5 10*3/uL (ref 4.0–10.5)
nRBC: 0 % (ref 0.0–0.2)

## 2022-06-09 LAB — COMPREHENSIVE METABOLIC PANEL
ALT: 16 U/L (ref 0–44)
AST: 20 U/L (ref 15–41)
Albumin: 3.5 g/dL (ref 3.5–5.0)
Alkaline Phosphatase: 56 U/L (ref 38–126)
Anion gap: 9 (ref 5–15)
BUN: 14 mg/dL (ref 8–23)
CO2: 21 mmol/L — ABNORMAL LOW (ref 22–32)
Calcium: 10.4 mg/dL — ABNORMAL HIGH (ref 8.9–10.3)
Chloride: 107 mmol/L (ref 98–111)
Creatinine, Ser: 1.24 mg/dL (ref 0.61–1.24)
GFR, Estimated: >60 mL/min (ref >60)
Glucose, Bld: 79 mg/dL (ref 70–99)
Potassium: 3.4 mmol/L — ABNORMAL LOW (ref 3.5–5.1)
Sodium: 137 mmol/L (ref 135–145)
Total Bilirubin: 0.3 mg/dL (ref 0.3–1.2)
Total Protein: 5.8 g/dL — ABNORMAL LOW (ref 6.5–8.1)

## 2022-06-09 LAB — POC SARS CORONAVIRUS 2 AG: SARSCOV2ONAVIRUS 2 AG: NEGATIVE

## 2022-06-09 LAB — POCT URINE DRUG SCREEN - MANUAL ENTRY (I-SCREEN)
POC Amphetamine UR: NOT DETECTED
POC Buprenorphine (BUP): NOT DETECTED
POC Cocaine UR: POSITIVE — AB
POC Marijuana UR: POSITIVE — AB
POC Methadone UR: NOT DETECTED
POC Methamphetamine UR: NOT DETECTED
POC Morphine: NOT DETECTED
POC Oxazepam (BZO): NOT DETECTED
POC Oxycodone UR: NOT DETECTED
POC Secobarbital (BAR): NOT DETECTED

## 2022-06-09 LAB — ETHANOL: Alcohol, Ethyl (B): 182 mg/dL — ABNORMAL HIGH (ref <10)

## 2022-06-09 LAB — RESP PANEL BY RT-PCR (FLU A&B, COVID) ARPGX2
Influenza A by PCR: NEGATIVE
Influenza B by PCR: NEGATIVE
SARS Coronavirus 2 by RT PCR: NEGATIVE

## 2022-06-09 LAB — TSH: TSH: 2.313 u[IU]/mL (ref 0.350–4.500)

## 2022-06-09 MED ORDER — HYDROXYZINE HCL 25 MG PO TABS: 25.0000 mg | ORAL_TABLET | Freq: Four times a day (QID) | ORAL | Status: DC | PRN

## 2022-06-09 MED ORDER — LORAZEPAM 1 MG PO TABS: 1.0000 mg | ORAL_TABLET | Freq: Four times a day (QID) | ORAL | Status: DC | PRN

## 2022-06-09 MED ORDER — ALBUTEROL SULFATE HFA 108 (90 BASE) MCG/ACT IN AERS
2.0000 | INHALATION_SPRAY | RESPIRATORY_TRACT | Status: DC | PRN
  Administered 2022-06-09: 2 via RESPIRATORY_TRACT
  Filled 2022-06-09: qty 6.7

## 2022-06-09 MED ORDER — ONDANSETRON 4 MG PO TBDP: 4.0000 mg | ORAL_TABLET | Freq: Four times a day (QID) | ORAL | Status: DC | PRN

## 2022-06-09 MED ORDER — THIAMINE HCL 100 MG/ML IJ SOLN
100.0000 mg | Freq: Once | INTRAMUSCULAR | Status: AC
  Administered 2022-06-09: 100 mg via INTRAMUSCULAR
  Filled 2022-06-09: qty 2

## 2022-06-09 MED ORDER — MAGNESIUM HYDROXIDE 400 MG/5ML PO SUSP: 30.0000 mL | Freq: Every day | ORAL | Status: DC | PRN

## 2022-06-09 MED ORDER — ACETAMINOPHEN 325 MG PO TABS: 650.0000 mg | ORAL_TABLET | Freq: Four times a day (QID) | ORAL | Status: DC | PRN

## 2022-06-09 MED ORDER — ADULT MULTIVITAMIN W/MINERALS CH
1.0000 | ORAL_TABLET | Freq: Every day | ORAL | Status: DC
  Administered 2022-06-09 – 2022-06-10 (×2): 1 via ORAL
  Filled 2022-06-09 (×2): qty 1

## 2022-06-09 MED ORDER — LOPERAMIDE HCL 2 MG PO CAPS: 2.0000 mg | ORAL_CAPSULE | ORAL | Status: DC | PRN

## 2022-06-09 MED ORDER — ALUM & MAG HYDROXIDE-SIMETH 200-200-20 MG/5ML PO SUSP: 30.0000 mL | ORAL | Status: DC | PRN

## 2022-06-09 MED ORDER — THIAMINE HCL 100 MG PO TABS
100.0000 mg | ORAL_TABLET | Freq: Every day | ORAL | Status: DC
  Administered 2022-06-10: 100 mg via ORAL
  Filled 2022-06-09: qty 1

## 2022-06-09 NOTE — ED Notes (Signed)
Mr. Bracewell is currently sleeping no distressed or disturbed sleep patterns noted respirations are easy skin color is appropraite for his ethnicity.

## 2022-06-09 NOTE — BH Assessment (Addendum)
Comprehensive Clinical Assessment (CCA) Screening, Triage and Referral Note  06/09/2022 Tim [name] [id number]  Chief Complaint: Patient appears severely intoxicated evidenced by slurred/garbled speech. No additional information due to patients' current presentation.   Visit Diagnosis: Screening/Triage completed. Patient is Urgent. Patient is in Room 135. MSE to be completed by Darrick Grinder, NP.   Patient Reported Information How did you hear about [place]? Legal System  What Is the Reason for Your Visit/Call Today?  Tim [name] is a 66 year old male presenting to [facility], transported [employer]. Patient denied SI, HI, and AVH's. Patient appears severely intoxicated evidenced by slurred/garbled speech. However, according to the chart review this may be his baseline presentation. Patient reported consuming alcohol prior to arrival, 6 pack of beer. Also, smoked a 3 bowls of marijuana today. He sometimes uses cocaine and last use was 3 days ago.  He reports that his mental health diagnosis is "Anger and anxiety". He is prescribed Vistaril and Trazadone. However, non-compliant since July 2023. No additional information due to patients' current presentation. Patient has presented to the [facility] 04/15/2022-04/16/2022 and 04/25/2022 and 04/28/2022 with similar presentations. Also, in the [facility] 04/15/2022, 02/06/2022, and 02/02/2022 with complaints of alcohol intoxication.  How Long Has This Been Causing You Problems? 1-6 months  What Do You Feel Would Help You the Most Today? Alcohol or Drug Use Treatment   Have You Recently Had Any Thoughts About Hurting Yourself? No  Are You Planning to Commit Suicide/Harm Yourself At This time? No   Have you Recently Had Thoughts About Hurting Someone [name]? No  Are You Planning to Harm Someone at This Time? No  Explanation: No data recorded  Have You Used Any Alcohol or  Drugs in the Past 24 Hours? Yes  How Long Ago Did You Use Drugs or Alcohol? No data recorded What Did You Use and How Much? alcohol "bottle"   Do You Currently Have a Therapist/Psychiatrist? No  Name of Therapist/Psychiatrist: No data recorded  Have You Been Recently Discharged From Any Office Practice or Programs? No  Explanation of Discharge From Practice/Program: No data recorded   CCA Screening Triage Referral Assessment Type of Contact: Face-to-Face   Does Patient Present under Involuntary Commitment? No  IVC Papers Initial File Date: No data recorded  [place] of Residence: [place]   Patient Currently Receiving the Following Services: Medication Management   Determination of Need: Urgent (48 hours)   Options For Referral: Facility-Based Crisis; Medication Management   Discharge Disposition:     Melynda Ripple, Counselor

## 2022-06-09 NOTE — ED Notes (Signed)
Patient present to [facility], transported [employer]. Patient denied SI, HI, and AVH's. Patient appears severely intoxicated evidenced by slurred/garbled speech. Patient denies any physical complaints when asked. No acute distress noted. Patient oriented to unit and meal given. Support and encouragement provided. Routine safety checks conducted according to facility protocol. Encouraged patient to notify staff if thoughts of harm toward self or others arise. Patient verbalize understanding and agreement. Will continue to monitor for safety.

## 2022-06-09 NOTE — ED Provider Notes (Signed)
[facility] Continuous Assessment Admission H&P  Date: 06/09/22 Patient Name: Tim [name] MRN: [id number] Chief Complaint: No chief complaint on file.    Diagnoses:  Final diagnoses:  Alcohol abuse with intoxication ([place])   HPI: Pt presents voluntarily to [facility] behavioral health with police escort. Pt is assessed face-to-face by nurse practitioner.   [name], 66 y.o., male patient seen face to face by this provider, consulted with Dr. Dwyane Dee; and chart reviewed on 06/09/22. On evaluation [facility] reports he called the police today because "need to get back to mental health." He appears to be inebriated, and presents w/ disorganized TP, circumstantial description of associations. His reported mood is euthymic. Affect is animated. His speech is garbled and slurred. He reports he drank 3 quarts of beer prior to arrival at this facility. He states he typically drinks "12 pack (of beer) when I can get it". He reports he also used marijuana today, estimates about 3 bowls. He reports use of cocaine at times, with last use 3 days ago. He reports good appetite, eating at least 3 meals a day. He reports good access to food. He reports he sleeps about 4 hours a night. Pt reports hx of NSSI, burning himself with cigarettes. He reports he last engaged in NSSI 3 years ago. Pt denies SI/VI/HI, AVH, paranoia. He denies hx of seizures or DTs. He reports he is not working right now. He states he receives disability and SSI. When asked about legal hx, pt reports he is a "model citizen". He denies he is taking any daily medications. He tells me he is the "picture of health". He denies access to a firearm or other weapon.  Discussed w/ pt admission to overnight observation w/ reassessment by psychiatry. Pt agrees w/ recommendation.   PHQ 2-9:  [place] ED from 04/25/2022 in [facility]  Thoughts that you would be better off dead, or of hurting yourself in  some way Not at all  PHQ-9 Total Score 22       [place] ED from 04/25/2022 in [facility] ED from 04/15/2022 in [facility] ED from 03/29/2022 in [facility]  C-SSRS RISK CATEGORY High Risk High Risk No Risk        Total Time spent with patient: 20 minutes  Musculoskeletal  Strength & Muscle Tone: within normal limits Gait & Station: normal Patient leans: N/A  Psychiatric Specialty Exam  Presentation General Appearance: Disheveled; Appropriate for Environment  Eye Contact:Fair  Speech:Garbled; Slurred  Speech Volume:Normal  Handedness:Right   Mood and Affect  Mood:Euthymic  Affect:-- (animated)   Thought Process  Thought Processes:Disorganized  Descriptions of Associations:Circumstantial  Orientation:Full (Time, Place and Person)  Thought Content:Logical  Diagnosis of Schizophrenia or Schizoaffective disorder in past: No data recorded  Hallucinations:Hallucinations: None  Ideas of Reference:None  Suicidal Thoughts:Suicidal Thoughts: No  Homicidal Thoughts:Homicidal Thoughts: No   Sensorium  Memory:Immediate Fair  Judgment:Impaired  Insight:Fair   Executive Functions  Concentration:Fair  Attention Span:Fair  [place]  Language:Poor   Psychomotor Activity  Psychomotor Activity:Psychomotor Activity: Normal   Assets  Assets:Communication Skills; Desire for Improvement; Financial Resources/Insurance; Housing   Sleep  Sleep:Sleep: Poor   Nutritional Assessment (For OBS and FBC admissions only) Has the patient had a weight loss or gain of 10 pounds or more in the last 3 months?: No Has the patient had a decrease in food intake/or  appetite?: No Does the patient have dental problems?: No Does the patient have eating habits or behaviors that may be indicators of an eating disorder including binging or inducing  vomiting?: No Has the patient recently lost weight without trying?: 0 Has the patient been eating poorly because of a decreased appetite?: 0 Malnutrition Screening Tool Score: 0    Physical Exam Cardiovascular:     Rate and Rhythm: Normal rate.  Pulmonary:     Effort: Pulmonary effort is normal.  Neurological:     Mental Status: He is alert and oriented to person, place, and time.  Psychiatric:        Mood and Affect: Mood normal.        Speech: Speech is slurred.        Behavior: Behavior is cooperative.        Thought Content: Thought content is not paranoid or delusional. Thought content does not include homicidal or suicidal ideation.    Review of Systems  Constitutional:  Negative for chills and fever.  Respiratory:  Negative for shortness of breath.   Cardiovascular:  Negative for chest pain and palpitations.  Gastrointestinal:  Negative for abdominal pain.  Neurological:  Negative for dizziness and headaches.  Psychiatric/Behavioral:  Positive for substance abuse.     Blood pressure 90/60, pulse 68, temperature 98.8 F (37.1 C), temperature source Oral, resp. rate 18, height 5\' 11"  (1.803 m), weight 170 lb (77.1 kg), SpO2 92 %. Body mass index is 23.71 kg/m.  Past Psychiatric History: Per chart review, hx of alcoholism, anxiety, depression, mental health disorder  Is the patient at risk to self? Yes  Has the patient been a risk to self in the past 6 months? Yes .    Has the patient been a risk to self within the distant past? Yes   Is the patient a risk to others? No   Has the patient been a risk to others in the past 6 months? No   Has the patient been a risk to others within the distant past? No   Past Medical History:  Past Medical History:  Diagnosis Date   Alcoholism (HCC)    Anxiety    COPD (chronic obstructive pulmonary disease) (HCC)    Depression    Hep C w/o coma, chronic (HCC)    History of hiatal hernia    hernia rt groin   Mental health  disorder    Scoliosis    Thyroid disease     Past Surgical History:  Procedure Laterality Date   INGUINAL HERNIA REPAIR Right 10/09/2021   Procedure: HERNIA REPAIR INGUINAL INCARCERATED;  Surgeon: 10/11/2021, MD;  Location: [facility] ORS;  Service: General;  Laterality: Right;   SKIN CANCER EXCISION     THYROID SURGERY      Family History:  Family History  Problem Relation Age of Onset   Dementia Mother     Social History:  Social History   Socioeconomic History   Marital status: Single    Spouse name: Not on file   Number of children: Not on file   Years of education: Not on file   Highest education level: Not on file  Occupational History   Not on file  Tobacco Use   Smoking status: Every Day    Packs/day: 0.50    Years: 40.00    Total pack years: 20.00    Types: Cigarettes   Smokeless tobacco: Never   Tobacco comments:    Patient refused  Vaping Use  Vaping Use: Never used  Substance and Sexual Activity   Alcohol use: Yes   Drug use: Yes    Types: Marijuana   Sexual activity: Never    Birth control/protection: None  Other Topics Concern   Not on file  Social History Narrative   ** Merged History Encounter **       Social Determinants of Health   Financial Resource Strain: Not on file  Food Insecurity: Not on file  Transportation Needs: Not on file  Physical Activity: Not on file  Stress: Not on file  Social Connections: Not on file  Intimate Partner Violence: Not on file    SDOH:  SDOH Screenings   Alcohol Screen: Medium Risk (02/14/2020)   Alcohol Screen    Last Alcohol Screening Score (AUDIT): 26  Depression (PHQ2-9): High Risk (04/28/2022)   Depression (PHQ2-9)    PHQ-2 Score: 22  Financial Resource Strain: Not on file  Food Insecurity: Not on file  Housing: Not on file  Physical Activity: Not on file  Social Connections: Not on file  Stress: Not on file  Tobacco Use: High Risk (04/16/2022)   Patient History    Smoking Tobacco Use:  Every Day    Smokeless Tobacco Use: Never    Passive Exposure: Not on file  Transportation Needs: Not on file    Last Labs:  Admission on 04/25/2022, Discharged on 04/28/2022  Component Date Value Ref Range Status   SARS Coronavirus 2 by RT PCR 04/25/2022 NEGATIVE  NEGATIVE Final   Comment: (NOTE) SARS-CoV-2 target nucleic acids are NOT DETECTED.  The SARS-CoV-2 RNA is generally detectable in upper respiratory specimens during the acute phase of infection. The lowest concentration of SARS-CoV-2 viral copies this assay can detect is 138 copies/mL. A negative result does not preclude SARS-Cov-2 infection and should not be used as the sole basis for treatment or other patient management decisions. A negative result may occur with  improper specimen collection/handling, submission of specimen other than nasopharyngeal swab, presence of viral mutation(s) within the areas targeted by this assay, and inadequate number of viral copies(<138 copies/mL). A negative result must be combined with clinical observations, patient history, and epidemiological information. The expected result is Negative.  Fact Sheet for Patients:  EntrepreneurPulse.com.au  Fact Sheet for Healthcare Providers:  IncredibleEmployment.be  This test is no                          t yet approved or cleared by the [place] FDA and  has been authorized for detection and/or diagnosis of SARS-CoV-2 by FDA under an Emergency Use Authorization (EUA). This EUA will remain  in effect (meaning this test can be used) for the duration of the COVID-19 declaration under Section 564(b)(1) of the Act, 21 U.S.C.section 360bbb-3(b)(1), unless the authorization is terminated  or revoked sooner.       Influenza A by PCR 04/25/2022 NEGATIVE  NEGATIVE Final   Influenza B by PCR 04/25/2022 NEGATIVE  NEGATIVE Final   Comment: (NOTE) The Xpert Xpress SARS-CoV-2/FLU/RSV plus assay is intended as  an aid in the diagnosis of influenza from Nasopharyngeal swab specimens and should not be used as a sole basis for treatment. Nasal washings and aspirates are unacceptable for Xpert Xpress SARS-CoV-2/FLU/RSV testing.  Fact Sheet for Patients: EntrepreneurPulse.com.au  Fact Sheet for Healthcare Providers: IncredibleEmployment.be  This test is not yet approved or cleared by the [place] FDA and has been authorized for detection and/or diagnosis of  SARS-CoV-2 by FDA under an Emergency Use Authorization (EUA). This EUA will remain in effect (meaning this test can be used) for the duration of the COVID-19 declaration under Section 564(b)(1) of the Act, 21 U.S.C. section 360bbb-3(b)(1), unless the authorization is terminated or revoked.  Performed at [facility], [place] [address], [place],  [address]    TSH 04/25/2022 3.275  0.350 - 4.500 uIU/mL Final   Comment: Performed by a 3rd Generation assay with a functional sensitivity of <=0.01 uIU/mL. Performed at [facility], [place] [address], [place], [place] [address]    Alcohol, Ethyl (B) 04/25/2022 146 (H)  <10 mg/dL Final   Comment: (NOTE) Lowest detectable limit for serum alcohol is 10 mg/dL.  For medical purposes only. Performed at [facility], [place] [address] [place], [place] [address]    POC Amphetamine UR 04/26/2022 None Detected  NONE DETECTED (Cut Off Level 1000 ng/mL) Final   POC Secobarbital (BAR) 04/26/2022 None Detected  NONE DETECTED (Cut Off Level 300 ng/mL) Final   POC Buprenorphine (BUP) 04/26/2022 None Detected  NONE DETECTED (Cut Off Level 10 ng/mL) Final   POC Oxazepam (BZO) 04/26/2022 None Detected  NONE DETECTED (Cut Off Level 300 ng/mL) Final   POC Cocaine UR 04/26/2022 Positive (A)  NONE DETECTED (Cut Off Level 300 ng/mL) Final   POC Methamphetamine UR 04/26/2022 None Detected  NONE DETECTED (Cut Off Level 1000 ng/mL) Final   POC Morphine  04/26/2022 None Detected  NONE DETECTED (Cut Off Level 300 ng/mL) Final   POC Methadone UR 04/26/2022 None Detected  NONE DETECTED (Cut Off Level 300 ng/mL) Final   POC Oxycodone UR 04/26/2022 None Detected  NONE DETECTED (Cut Off Level 100 ng/mL) Final   POC Marijuana UR 04/26/2022 Positive (A)  NONE DETECTED (Cut Off Level 50 ng/mL) Final   SARSCOV2ONAVIRUS 2 AG 04/25/2022 NEGATIVE  NEGATIVE Final   Comment: (NOTE) SARS-CoV-2 antigen NOT DETECTED.   Negative results are presumptive.  Negative results do not preclude SARS-CoV-2 infection and should not be used as the sole basis for treatment or other patient management decisions, including infection  control decisions, particularly in the presence of clinical signs and  symptoms consistent with COVID-19, or in those who have been in contact with the virus.  Negative results must be combined with clinical observations, patient history, and epidemiological information. The expected result is Negative.  Fact Sheet for Patients: HandmadeRecipes.com.cy  Fact Sheet for Healthcare Providers: FuneralLife.at  This test is not yet approved or cleared by the [place] FDA and  has been authorized for detection and/or diagnosis of SARS-CoV-2 by FDA under an Emergency Use Authorization (EUA).  This EUA will remain in effect (meaning this test can be used) for the duration of  the COV                          ID-19 declaration under Section 564(b)(1) of the Act, 21 U.S.C. section 360bbb-3(b)(1), unless the authorization is terminated or revoked sooner.     Total Protein 04/27/2022 5.8 (L)  6.5 - 8.1 g/dL Final   Albumin 04/27/2022 3.5  3.5 - 5.0 g/dL Final   AST 04/27/2022 21  15 - 41 U/L Final   ALT 04/27/2022 14  0 - 44 U/L Final   Alkaline Phosphatase 04/27/2022 58  38 - 126 U/L Final   Total Bilirubin 04/27/2022 0.6  0.3 - 1.2 mg/dL Final   Bilirubin, Direct 04/27/2022 0.2  0.0 - 0.2  mg/dL Final   Indirect Bilirubin 04/27/2022 0.4  0.3 - 0.9 mg/dL Final   Performed at [place] [address], [place], [place] [address]   Fibrosis Score 04/27/2022 0.11  0.00 - 0.21 Final   Fibrosis Stage 04/27/2022  No fibrosis   Final   Comment: Comment                    F0     Necroinflammat Activity Score 04/27/2022 0.03  0.00 - 0.17 Final   Necroinflammat Activity Grade 04/27/2022 No activity   Final   Comment: SEE COMMENT RESULT:A0    ALPHA 2-MACROGLOBULINS, QN 04/27/2022 131  110 - 276 mg/dL Final   Haptoglobin 04/27/2022 69  32 - 363 mg/dL Final   Apolipoprotein A-1 04/27/2022 162  101 - 178 mg/dL Final   Bilirubin, Total 04/27/2022 0.3  0.0 - 1.2 mg/dL Final   GGT 04/27/2022 19  0 - 65 IU/L Final   ALT (SGPT) P5P 04/27/2022 14  0 - 55 IU/L Final   Interpretations: 04/27/2022 Comment   Final   Comment: (NOTE) Quantitative results of 6 biochemical tests are analyzed using a computational algorithm to provide a quantitative surrogate marker (0.0-1.0) for liver fibrosis (METAVIR F0-F4) and for necroinflammatory activity (METAVIR A0-A3).    Fibrosis Scoring: 04/27/2022 Comment   Final   Comment: (NOTE)     <=0.21 = Stage F0 - No fibrosis 0.21 - 0.27 = Stage F0 - F1 0.27 - 0.31 = Stage F1 - Portal fibrosis 0.31 - 0.48 = Stage F1 - F2 0.48 - 0.58 = Stage F2 - Bridging fibrosis with few septa 0.58 - 0.72 = Stage F3 - Bridging fibrosis with many septa 0.72 - 0.74 = Stage F3 - F4      >0.74 = Stage F4 - Cirrhosis    Necroinflamm Activity Scoring: 04/27/2022 Comment   Final   Comment: (NOTE)      <0.17 = Grade A0 - No Activity 0.17 - 0.29 = Grade A0 - A1 0.29 - 0.36 = Grade A1 - Minimal activity 0.36 - 0.52 = Grade A1 - A2 0.52 - 0.60 = Grade A2 - Moderate activity 0.60 - 0.62 = Grade A2 - A3      >0.62 = Grade A3 - Severe activity    Limitations: 04/27/2022 Comment   Final   Comment: (NOTE) The negative predictive value of a Fibrotest score <0.31 (absence  of clinically significant fibrosis) was 85% when compared to liver biopsy in 1,270 HCV infected patients with a 38% prevalence of significant liver fibrosis (F2, 3 or 4). The positive predictive value of a Fibro-test score >0.48 (F2, 3, 4) was 61% in that same patient cohort. HCV FibroSURE is not recommended in patients with Rosanna Randy Disease, acute hemolysis (e.g. HCV ribavirin therapy mediated hemolysis) acute hepa-titis of the liver, extra-hepatic cholestasis, transplant patients, and/or renal insufficiency patients.  Any of these clinical situations may lead to inaccurate quantitative predictions of fibrosis and necroinflammatory activity in the liver.    Comment 04/27/2022 Comment   Final   Comment: (NOTE) This test was developed and its performance characteristics determined by [facility].  It has not been cleared or approved by the Food and Drug Administration.  The FDA has determined that such clearance or approval is not necessary. For questions regarding this report please contact customer service at [phone]. Performed At: [facility] [place], [place] [id number] Rush Farmer MD [id number]  HCV Quantitative 04/27/2022 HCV Not Detected  >50 IU/mL Final   Test Information 04/27/2022 Comment   Final   Comment: (NOTE) The quantitative range of this assay is 15 IU/mL to 100 million IU/mL. Performed At: [facility] [place], [place] [id number] Rush Farmer MD [id number]    AFP, Serum, Tumor Marker 04/27/2022 2.1  0.0 - 8.4 ng/mL Final   Comment: (NOTE) Roche Diagnostics Electrochemiluminescence Immunoassay (ECLIA) Values obtained with different assay methods or kits cannot be used interchangeably.  Results cannot be interpreted as absolute evidence of the presence or absence of malignant disease. This test is not interpretable in pregnant females. Performed At: [facility] [place], [place]  [id number] Rush Farmer MD [id number]   Admission on 04/15/2022, Discharged on 04/16/2022  Component Date Value Ref Range Status   SARS Coronavirus 2 by RT PCR 04/16/2022 NEGATIVE  NEGATIVE Final   Comment: (NOTE) SARS-CoV-2 target nucleic acids are NOT DETECTED.  The SARS-CoV-2 RNA is generally detectable in upper respiratory specimens during the acute phase of infection. The lowest concentration of SARS-CoV-2 viral copies this assay can detect is 138 copies/mL. A negative result does not preclude SARS-Cov-2 infection and should not be used as the sole basis for treatment or other patient management decisions. A negative result may occur with  improper specimen collection/handling, submission of specimen other than nasopharyngeal swab, presence of viral mutation(s) within the areas targeted by this assay, and inadequate number of viral copies(<138 copies/mL). A negative result must be combined with clinical observations, patient history, and epidemiological information. The expected result is Negative.  Fact Sheet for Patients:  EntrepreneurPulse.com.au  Fact Sheet for Healthcare Providers:  IncredibleEmployment.be  This test is no                          t yet approved or cleared by the [place] FDA and  has been authorized for detection and/or diagnosis of SARS-CoV-2 by FDA under an Emergency Use Authorization (EUA). This EUA will remain  in effect (meaning this test can be used) for the duration of the COVID-19 declaration under Section 564(b)(1) of the Act, 21 U.S.C.section 360bbb-3(b)(1), unless the authorization is terminated  or revoked sooner.       Influenza A by PCR 04/16/2022 NEGATIVE  NEGATIVE Final   Influenza B by PCR 04/16/2022 NEGATIVE  NEGATIVE Final   Comment: (NOTE) The Xpert Xpress SARS-CoV-2/FLU/RSV plus assay is intended as an aid in the diagnosis of influenza from Nasopharyngeal swab specimens and should  not be used as a sole basis for treatment. Nasal washings and aspirates are unacceptable for Xpert Xpress SARS-CoV-2/FLU/RSV testing.  Fact Sheet for Patients: EntrepreneurPulse.com.au  Fact Sheet for Healthcare Providers: IncredibleEmployment.be  This test is not yet approved or cleared by the [place] FDA and has been authorized for detection and/or diagnosis of SARS-CoV-2 by FDA under an Emergency Use Authorization (EUA). This EUA will remain in effect (meaning this test can be used) for the duration of the COVID-19 declaration under Section 564(b)(1) of the Act, 21 U.S.C. section 360bbb-3(b)(1), unless the authorization is terminated or revoked.  Performed at [facility], [place] [address], [place], [place] [address]    WBC 04/16/2022 5.3  4.0 - 10.5 K/uL Final   RBC 04/16/2022 4.96  4.22 - 5.81 MIL/uL Final   Hemoglobin 04/16/2022 15.2  13.0 - 17.0 g/dL Final   HCT 04/16/2022 44.7  39.0 - 52.0 % Final  MCV 04/16/2022 90.1  80.0 - 100.0 fL Final   MCH 04/16/2022 30.6  26.0 - 34.0 pg Final   MCHC 04/16/2022 34.0  30.0 - 36.0 g/dL Final   RDW 04/16/2022 12.7  11.5 - 15.5 % Final   Platelets 04/16/2022 190  150 - 400 K/uL Final   nRBC 04/16/2022 0.0  0.0 - 0.2 % Final   Neutrophils Relative % 04/16/2022 51  % Final   Neutro Abs 04/16/2022 2.7  1.7 - 7.7 K/uL Final   Lymphocytes Relative 04/16/2022 31  % Final   Lymphs Abs 04/16/2022 1.7  0.7 - 4.0 K/uL Final   Monocytes Relative 04/16/2022 10  % Final   Monocytes Absolute 04/16/2022 0.5  0.1 - 1.0 K/uL Final   Eosinophils Relative 04/16/2022 5  % Final   Eosinophils Absolute 04/16/2022 0.3  0.0 - 0.5 K/uL Final   Basophils Relative 04/16/2022 2  % Final   Basophils Absolute 04/16/2022 0.1  0.0 - 0.1 K/uL Final   Immature Granulocytes 04/16/2022 1  % Final   Abs Immature Granulocytes 04/16/2022 0.03  0.00 - 0.07 K/uL Final   Performed at [facility], [place] [address]  [place], [place] [address]   Sodium 04/16/2022 137  135 - 145 mmol/L Final   Potassium 04/16/2022 4.5  3.5 - 5.1 mmol/L Final   Chloride 04/16/2022 104  98 - 111 mmol/L Final   CO2 04/16/2022 27  22 - 32 mmol/L Final   Glucose, Bld 04/16/2022 94  70 - 99 mg/dL Final   Glucose reference range applies only to samples taken after fasting for at least 8 hours.   BUN 04/16/2022 12  8 - 23 mg/dL Final   Creatinine, Ser 04/16/2022 1.08  0.61 - 1.24 mg/dL Final   Calcium 04/16/2022 10.2  8.9 - 10.3 mg/dL Final   Total Protein 04/16/2022 6.4 (L)  6.5 - 8.1 g/dL Final   Albumin 04/16/2022 4.1  3.5 - 5.0 g/dL Final   AST 04/16/2022 22  15 - 41 U/L Final   ALT 04/16/2022 19  0 - 44 U/L Final   Alkaline Phosphatase 04/16/2022 61  38 - 126 U/L Final   Total Bilirubin 04/16/2022 0.7  0.3 - 1.2 mg/dL Final   GFR, Estimated 04/16/2022 >60  >60 mL/min Final   Comment: (NOTE) Calculated using the CKD-EPI Creatinine Equation (2021)    Anion gap 04/16/2022 6  5 - 15 Final   Performed at [place] [address], [place], [place] [address]   Hgb A1c MFr Bld 04/16/2022 5.6  4.8 - 5.6 % Final   Comment: (NOTE) Pre diabetes:          5.7%-6.4%  Diabetes:              >6.4%  Glycemic control for   <7.0% adults with diabetes    Mean Plasma Glucose 04/16/2022 114.02  mg/dL Final   Performed at [facility], [place] [address], [place], [place] [address]   Alcohol, Ethyl (B) 04/16/2022 201 (H)  <10 mg/dL Final   Comment: (NOTE) Lowest detectable limit for serum alcohol is 10 mg/dL.  For medical purposes only. Performed at [facility], [place] [address] [place], [place] [address]    TSH 04/16/2022 2.894  0.350 - 4.500 uIU/mL Final   Comment: Performed by a 3rd Generation assay with a functional sensitivity of <=0.01 uIU/mL. Performed at [facility], [place] [address] [place], [place] [address]  POC Amphetamine UR 04/15/2022 None Detected  NONE DETECTED (Cut Off Level 1000 ng/mL)  Final   POC Secobarbital (BAR) 04/15/2022 None Detected  NONE DETECTED (Cut Off Level 300 ng/mL) Final   POC Buprenorphine (BUP) 04/15/2022 None Detected  NONE DETECTED (Cut Off Level 10 ng/mL) Final   POC Oxazepam (BZO) 04/15/2022 None Detected  NONE DETECTED (Cut Off Level 300 ng/mL) Final   POC Cocaine UR 04/15/2022 Positive (A)  NONE DETECTED (Cut Off Level 300 ng/mL) Final   POC Methamphetamine UR 04/15/2022 None Detected  NONE DETECTED (Cut Off Level 1000 ng/mL) Final   POC Morphine 04/15/2022 None Detected  NONE DETECTED (Cut Off Level 300 ng/mL) Final   POC Methadone UR 04/15/2022 None Detected  NONE DETECTED (Cut Off Level 300 ng/mL) Final   POC Oxycodone UR 04/15/2022 None Detected  NONE DETECTED (Cut Off Level 100 ng/mL) Final   POC Marijuana UR 04/15/2022 Positive (A)  NONE DETECTED (Cut Off Level 50 ng/mL) Final   SARSCOV2ONAVIRUS 2 AG 04/16/2022 NEGATIVE  NEGATIVE Final   Comment: (NOTE) SARS-CoV-2 antigen NOT DETECTED.   Negative results are presumptive.  Negative results do not preclude SARS-CoV-2 infection and should not be used as the sole basis for treatment or other patient management decisions, including infection  control decisions, particularly in the presence of clinical signs and  symptoms consistent with COVID-19, or in those who have been in contact with the virus.  Negative results must be combined with clinical observations, patient history, and epidemiological information. The expected result is Negative.  Fact Sheet for Patients: https://www.jennings-kim.com/  Fact Sheet for Healthcare Providers: https://alexander-rogers.biz/  This test is not yet approved or cleared by the [place] FDA and  has been authorized for detection and/or diagnosis of SARS-CoV-2 by FDA under an Emergency Use Authorization (EUA).  This EUA will remain in effect (meaning this test can be used) for the duration of  the COV                           ID-19 declaration under Section 564(b)(1) of the Act, 21 U.S.C. section 360bbb-3(b)(1), unless the authorization is terminated or revoked sooner.     SARSCOV2ONAVIRUS 2 AG 04/16/2022 NEGATIVE  NEGATIVE Final   Comment: (NOTE) SARS-CoV-2 antigen NOT DETECTED.   Negative results are presumptive.  Negative results do not preclude SARS-CoV-2 infection and should not be used as the sole basis for treatment or other patient management decisions, including infection  control decisions, particularly in the presence of clinical signs and  symptoms consistent with COVID-19, or in those who have been in contact with the virus.  Negative results must be combined with clinical observations, patient history, and epidemiological information. The expected result is Negative.  Fact Sheet for Patients: https://www.jennings-kim.com/  Fact Sheet for Healthcare Providers: https://alexander-rogers.biz/  This test is not yet approved or cleared by the [place] FDA and  has been authorized for detection and/or diagnosis of SARS-CoV-2 by FDA under an Emergency Use Authorization (EUA).  This EUA will remain in effect (meaning this test can be used) for the duration of  the COV                          ID-19 declaration under Section 564(b)(1) of the Act, 21 U.S.C. section 360bbb-3(b)(1), unless the authorization is terminated or revoked sooner.      Allergies: Patient has no known allergies.  PTA Medications: (Not  in a hospital admission)   Medical Decision Making  -Pt is voluntary -Pt admitted to overnight observation w/ reassessment by psychiatry  Labs: -CBC w/ differential/platelet -CMP -Ethanol -Hepatic function panel -TSH  -EKG 12-Lead  -Resp panel by RT-PCR (Flu A&B, Covid) Anterior Nasal Swab  -POCT Urine Drug Screen - (I-Screen)  -POC SARS CORONAVIRUS 2 AG Nasal Swab   Medications: -Tylenol 650mg , oral, every 6 hours PRN, mild  pain -Maalox/Mylanta 10mL, oral, every 4 hours PRN, indigestion -Atarax 25mg , oral, every 6 hours PRN, anxiety/agitation or CIWA < or =10 -Imodium, 2-4mg , oral, as needed, diarrhea or loose stools -Ativan 1mg , oral, every 6 hours PRN, CIWA >10 -Milk of Magnesia, 32mL, oral, daily PRN, mild constipation -Multivitamin with minerals 1 tablet, oral, daily -Zofran-ODT 4mg , oral, every 6 hours PRN, nausea, vomiting -Vitamin B1 100mg , IM, once -Vitamin B1 100mg , oral, daily   Recommendations  Based on my evaluation the patient does not appear to have an emergency medical condition.  Tharon Aquas, NP 06/09/22  5:41 PM
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
## 2022-06-10 ENCOUNTER — Telehealth (HOSPITAL_COMMUNITY): Payer: Self-pay | Admitting: Licensed Clinical Social Worker

## 2022-06-10 NOTE — ED Provider Notes (Signed)
FBC/OBS ASAP Discharge Summary  Date and Time: 06/10/2022 10:32 AM  Name: Tim [name]  MRN:  [id number]   Discharge Diagnoses:  Final diagnoses:  Alcohol abuse with intoxication (HCC)   Subjective:  "Ready to go home"  Pt reports he is "ready to go home". He states he needs to take care of his 64 y/o dog and his home. He reports euthymic mood. He reports sleeping well last night. He denies SI/VI/HI, AVH, paranoia. Discussed concerns regarding continued substance use and multiple presentations to the ED/UC. Pt reports he's "not ready" to decrease substance use, although does recognize the need to do so. Pt appears to be in contemplation stage of change. Discussed risks of continued substance use on health. Pt verbalized understanding. Pt agrees for substance use treatment resources to be provided for him. He states he is planning on following up on AA groups which he has attended in the past and have been helpful for him. He states his church group also has meetings that he is thinking about attending. When asked about family psychiatric hx, he states his great grandfather was in a psychiatric hospital for his entire life, although is not sure reason for this. Discussed following up with outpatient psychiatry and counseling. Pt agrees w/ recommendation and to do so. Pt states he is connected w/ a PCP who he last saw several months ago. He agrees to follow up with his PCP for medical concerns.   Stay Summary:  Pt is a 66 y/o male presenting to [facility] on 06/09/22 w/ alcohol abuse with intoxication. On reassessment today, pt reports he is "ready to go home". He denies SI/VI/HI, AVH, paranoia. He has been discharged w/ outpatient resources.  Total Time spent with patient: 20 minutes  Past Psychiatric History:  Past Medical History:  Past Medical History:  Diagnosis Date   Alcoholism (HCC)    Anxiety    COPD (chronic obstructive pulmonary disease) (HCC)    Depression    Hep C w/o coma, chronic  (HCC)    History of hiatal hernia    hernia rt groin   Mental health disorder    Scoliosis    Thyroid disease     Past Surgical History:  Procedure Laterality Date   INGUINAL HERNIA REPAIR Right 10/09/2021   Procedure: HERNIA REPAIR INGUINAL INCARCERATED;  Surgeon: Harriette Bouillon, MD;  Location: [facility];  Service: General;  Laterality: Right;   SKIN CANCER EXCISION     THYROID SURGERY     Family History:  Family History  Problem Relation Age of Onset   Dementia Mother    Family Psychiatric History: Pt states his great grandfather was in a psychiatric hospital for his entire life, although is not sure reason for this.  Social History:  Social History   Substance and Sexual Activity  Alcohol Use Yes     Social History   Substance and Sexual Activity  Drug Use Yes   Types: Marijuana    Social History   Socioeconomic History   Marital status: Single    Spouse name: Not on file   Number of children: Not on file   Years of education: Not on file   Highest education level: Not on file  Occupational History   Not on file  Tobacco Use   Smoking status: Every Day    Packs/day: 0.50    Years: 40.00    Total pack years: 20.00    Types: Cigarettes   Smokeless tobacco: Never   Tobacco comments:  Patient refused  Vaping Use   Vaping Use: Never used  Substance and Sexual Activity   Alcohol use: Yes   Drug use: Yes    Types: Marijuana   Sexual activity: Never    Birth control/protection: None  Other Topics Concern   Not on file  Social History Narrative   ** Merged History Encounter **       Social Determinants of Health   Financial Resource Strain: Not on file  Food Insecurity: Not on file  Transportation Needs: Not on file  Physical Activity: Not on file  Stress: Not on file  Social Connections: Not on file   SDOH:  SDOH Screenings   Alcohol Screen: Medium Risk (02/14/2020)   Alcohol Screen    Last Alcohol Screening Score (AUDIT): 26  Depression  (PHQ2-9): High Risk (04/28/2022)   Depression (PHQ2-9)    PHQ-2 Score: 22  Financial Resource Strain: Not on file  Food Insecurity: Not on file  Housing: Not on file  Physical Activity: Not on file  Social Connections: Not on file  Stress: Not on file  Tobacco Use: High Risk (04/16/2022)   Patient History    Smoking Tobacco Use: Every Day    Smokeless Tobacco Use: Never    Passive Exposure: Not on file  Transportation Needs: Not on file    Tobacco Cessation:  A prescription for an FDA-approved tobacco cessation medication was offered at discharge and the patient refused  Current Medications:  Current Facility-Administered Medications  Medication Dose Route Frequency Provider Last Rate Last Admin   acetaminophen (TYLENOL) tablet 650 mg  650 mg Oral Q6H PRN Tharon Aquas, NP       albuterol (VENTOLIN HFA) 108 (90 Base) MCG/ACT inhaler 2 puff  2 puff Inhalation Q4H PRN Onuoha, Chinwendu V, NP   2 puff at 06/09/22 2029   alum & mag hydroxide-simeth (MAALOX/MYLANTA) 200-200-20 MG/5ML suspension 30 mL  30 mL Oral Q4H PRN Tharon Aquas, NP       hydrOXYzine (ATARAX) tablet 25 mg  25 mg Oral Q6H PRN Tharon Aquas, NP       loperamide (IMODIUM) capsule 2-4 mg  2-4 mg Oral PRN Tharon Aquas, NP       LORazepam (ATIVAN) tablet 1 mg  1 mg Oral Q6H PRN Tharon Aquas, NP       magnesium hydroxide (MILK OF MAGNESIA) suspension 30 mL  30 mL Oral Daily PRN Tharon Aquas, NP       multivitamin with minerals tablet 1 tablet  1 tablet Oral Daily Tharon Aquas, NP   1 tablet at 06/10/22 0900   ondansetron (ZOFRAN-ODT) disintegrating tablet 4 mg  4 mg Oral Q6H PRN Tharon Aquas, NP       thiamine (VITAMIN B1) tablet 100 mg  100 mg Oral Daily Tharon Aquas, NP   100 mg at 06/10/22 0900   Current Outpatient Medications  Medication Sig Dispense Refill   escitalopram (LEXAPRO) 20 MG tablet Take 1 tablet (20 mg total) by mouth daily. 30 tablet 0    hydrOXYzine (VISTARIL) 50 MG capsule Take 1 capsule (50 mg total) by mouth 3 (three) times daily as needed for anxiety. 60 capsule 0   ibuprofen (ADVIL) 200 MG tablet Take 400 mg by mouth every 8 (eight) hours as needed for moderate pain.     SYMBICORT 160-4.5 MCG/ACT inhaler 2 puffs 2 (two) times daily.     nicotine (NICODERM CQ - DOSED IN MG/24 HOURS)  21 mg/24hr patch Place 1 patch (21 mg total) onto the skin daily. (Patient not taking: Reported on 06/10/2022) 28 patch 0    PTA Medications: (Not in a hospital admission)      04/28/2022   11:36 AM 04/27/2022   11:17 AM 09/01/2021    6:34 PM  Depression screen PHQ 2/9  Decreased Interest 3 3 0  Down, Depressed, Hopeless 3 3 0  PHQ - 2 Score 6 6 0  Altered sleeping 3 3   Tired, decreased energy 3 3   Change in appetite 1 3   Feeling bad or failure about yourself  3 3   Trouble concentrating 3 3   Moving slowly or fidgety/restless 3 3   Suicidal thoughts 0 0   PHQ-9 Score 22 24     Flowsheet Row ED from 06/09/2022 in [facility] ED from 04/25/2022 in [facility] ED from 04/15/2022 in [place] Error: Q3, 4, or 5 should not be populated when Q2 is No High Risk High Risk       Musculoskeletal  Strength & Muscle Tone: within normal limits Gait & Station: normal Patient leans: N/A  Psychiatric Specialty Exam  Presentation  General Appearance: Disheveled  Eye Contact:Fair  Speech:-- (garbled at times, clear and coherent at times, overall improved from yesterday)  Speech Volume:Normal  Handedness:Right   Mood and Affect  Mood:Euthymic  Affect:Congruent   Thought Process  Thought Processes:Coherent  Descriptions of Associations:Intact  Orientation:Full (Time, Place and Person)  Thought Content:Logical  Diagnosis of Schizophrenia or Schizoaffective disorder in past: No    Hallucinations:Hallucinations:  None  Ideas of Reference:None  Suicidal Thoughts:Suicidal Thoughts: No  Homicidal Thoughts:Homicidal Thoughts: No   Sensorium  Memory:Immediate Fair  Judgment:Fair  Insight:Fair   Executive Functions  Concentration:Fair  Attention Span:Fair  [place]   Psychomotor Activity  Psychomotor Activity:Psychomotor Activity: Normal   Assets  Assets:Communication Skills; Desire for Improvement; Financial Resources/Insurance; Housing; Social Support   Sleep  Sleep:Sleep: Fair   Nutritional Assessment (For OBS and FBC admissions only) Has the patient had a weight loss or gain of 10 pounds or more in the last 3 months?: No Has the patient had a decrease in food intake/or appetite?: No Does the patient have dental problems?: No Does the patient have eating habits or behaviors that may be indicators of an eating disorder including binging or inducing vomiting?: No Has the patient recently lost weight without trying?: 0 Has the patient been eating poorly because of a decreased appetite?: 0 Malnutrition Screening Tool Score: 0    Physical Exam  Physical Exam Cardiovascular:     Rate and Rhythm: Bradycardia present.  Pulmonary:     Effort: Pulmonary effort is normal.  Neurological:     Mental Status: He is alert and oriented to person, place, and time.  Psychiatric:        Attention and Perception: Attention and perception normal.        Mood and Affect: Mood and affect normal.        Behavior: Behavior normal. Behavior is cooperative.        Thought Content: Thought content normal.    Review of Systems  Constitutional:  Negative for chills and fever.  Respiratory:  Negative for shortness of breath.   Cardiovascular:  Negative for chest pain and palpitations.  Gastrointestinal:  Negative for abdominal pain.  Neurological:  Negative for  dizziness and headaches.  Psychiatric/Behavioral:  Negative for depression,  hallucinations and suicidal ideas. The patient is not nervous/anxious.    Blood pressure 125/85, pulse (!) 59, temperature 97.9 F (36.6 C), temperature source Oral, resp. rate 18, height 5\' 11"  (1.803 m), weight 170 lb (77.1 kg), SpO2 98 %. Body mass index is 23.71 kg/m.  Demographic Factors:  Male  Loss Factors: NA  Historical Factors: Family history of mental illness or substance abuse  Risk Reduction Factors:   Sense of responsibility to family, Religious beliefs about death, and Positive social support  Continued Clinical Symptoms:  Alcohol/Substance Abuse/Dependencies Previous Psychiatric Diagnoses and Treatments  Cognitive Features That Contribute To Risk:  None    Suicide Risk:  Minimal: No identifiable suicidal ideation.  Patients presenting with no risk factors but with morbid ruminations; may be classified as minimal risk based on the severity of the depressive symptoms  Plan Of Care/Follow-up recommendations:  Follow up with provided outpatient resources  Disposition:  Discharge  , NP 06/10/2022, 10:32 AM

## 2022-06-10 NOTE — Telephone Encounter (Signed)
Clinician called client at [phone] and this is not a valid number. Clinician called client at [phone] and this number only rang and voicemail was not available. Clinician will attempt to contact client again on 06/13/2022.

## 2022-06-10 NOTE — ED Notes (Signed)
Pt denies SI/HI/AVH. Stated that his brother will be picking him up around 11:30-12 today. Safety maintained and will continue to monitor.

## 2022-06-10 NOTE — Discharge Instructions (Addendum)
Good morning, Tim [name]!  A referral to the CD-IOP program has been made to Tim [name], [facility].  The program operates on the second floor above the [facility] from where you have been discharged.  Tim [name] will be giving you a call either today or early next week to schedule an appointment for intake and assessment, but also to talk to you about the program and where you are in your motivation and drive to engage in treatment to start your sobriety.    In case of an urgent emergency, you have the option of contacting the Mobile Crisis Unit with [facility] at [phone].     SUBSTANCE USE TREATMENT for Medicaid and State Funded/IPRS  [facility] [address], [place], [address] [phone] phone NOTE: [facility] is no longer offering IOP services.  Serves those who are low-income or have no insurance.  [facility] [address], [place], [place], [address] [phone] phone [phone] fax NOTE: Does have Substance Abuse-Intensive Outpatient Program [facility]) as well as transitional housing if eligible.  [facility] [address] [place], [place], [address] [phone] phone [phone] fax  [facility] [phone] [place], [place], [address] [phone] phone [phone] fax         Outpatient Services for Therapy and Medication Management for [facility] [address], [place], [address] [phone] phone   New Patient Assessment/Therapy Walk-Ins:  Monday and Wednesday: 8 am until slots are full. Every 1st and 2nd Fridays of the month: 1 pm - 5 pm.  NO ASSESSMENT/THERAPY WALK-INS ON TUESDAYS OR THURSDAYS  New Patient Assessment/Medication Management Walk-Ins:  Monday - Friday:  8 am - 11 am.  For all walk-ins, we ask that you arrive by 7:30 am because patients will be seen in the order of arrival.  Availability is limited; therefore, you may  not be seen on the same day that you walk-in.  Our goal is to serve and meet the needs of our community to the best of our ability.   [facility] [address] [place], [place], [address] [phone] phone  [facility] [address] [place], [place], [address] [phone] phone  [facility] [address], [address] [place], [place], [address] [phone] phone  [facility] [address], [place], [address] [phone] phone  [facility] [address], [place], [address] [phone] phone  [facility] [address] [place], [place], [address] [phone] phone [phone] fax  [facility] [address] [place], [place], [address] [phone] phone [phone] fax  [facility] [phone] [address] [place], [place], [address] [phone] phone  Jovita Kussmaul [address] [place], [place], [address]  [phone] phone  [facility] [address] [place], [place], [address]  [phone] phone [phone] fax

## 2022-06-10 NOTE — ED Notes (Signed)
Discharge instructions provided and Pt stated understanding. Pt alert, orient and ambulatory prior to d/c from facility. Personal belongings returned from locker number 75. Pt escorted to the front lobby to d/c from facility w/his brother. Safety maintained.

## 2022-06-10 NOTE — ED Notes (Signed)
Tim [name] remains asleep with no signs of distress or difficulty breathing respirations are unlabored and skin color appropriate for ethnicity.

## 2022-06-10 NOTE — BH Assessment (Signed)
LCSW Progress Note   Per Kelle Darting, NP, this pt does not require psychiatric hospitalization at this time.  Pt is psychiatrically cleared.  Discharge instructions include several resources for substance use treatment and other outpatient resources for therapy and medication management.  A referral to CD-IOP at [facility] was made to Maeola Sarah, [facility].  EDP Kelle Darting, NP, has been notified.  Hansel Starling, MSW, LCSW [facility] [phone] or [phone]

## 2022-07-10 ENCOUNTER — Encounter (HOSPITAL_COMMUNITY): Payer: Self-pay

## 2022-07-10 ENCOUNTER — Other Ambulatory Visit: Payer: Self-pay

## 2022-07-10 ENCOUNTER — Emergency Department (HOSPITAL_COMMUNITY)
Admission: EM | Admit: 2022-07-10 | Discharge: 2022-07-11 | Disposition: A | Discharge status: Home / Self Care | Payer: Medicaid Other | Attending: Emergency Medicine | Admitting: Emergency Medicine

## 2022-07-10 DIAGNOSIS — F1090 Alcohol use, unspecified, uncomplicated: Secondary | ICD-10-CM

## 2022-07-10 DIAGNOSIS — F1020 Alcohol dependence, uncomplicated: Secondary | ICD-10-CM | POA: Insufficient documentation

## 2022-07-10 DIAGNOSIS — F10129 Alcohol abuse with intoxication, unspecified: Secondary | ICD-10-CM | POA: Diagnosis not present

## 2022-07-10 DIAGNOSIS — F32A Depression, unspecified: Secondary | ICD-10-CM | POA: Diagnosis not present

## 2022-07-10 DIAGNOSIS — Y901 Blood alcohol level of 20-39 mg/100 ml: Secondary | ICD-10-CM | POA: Diagnosis not present

## 2022-07-10 DIAGNOSIS — S46911A Strain of unspecified muscle, fascia and tendon at shoulder and upper arm level, right arm, initial encounter: Secondary | ICD-10-CM

## 2022-07-10 DIAGNOSIS — X58XXXA Exposure to other specified factors, initial encounter: Secondary | ICD-10-CM | POA: Insufficient documentation

## 2022-07-10 DIAGNOSIS — Z79899 Other long term (current) drug therapy: Secondary | ICD-10-CM | POA: Diagnosis not present

## 2022-07-10 DIAGNOSIS — S4991XA Unspecified injury of right shoulder and upper arm, initial encounter: Secondary | ICD-10-CM | POA: Diagnosis present

## 2022-07-10 NOTE — ED Triage Notes (Addendum)
Pt BIB EMS with depression from lack of motivation. ETOH. Pt reports drinking 15-20 beers today.

## 2022-07-11 ENCOUNTER — Emergency Department (HOSPITAL_COMMUNITY): Payer: Medicaid Other

## 2022-07-11 LAB — CBC WITH DIFFERENTIAL/PLATELET
Abs Immature Granulocytes: 0.02 10*3/uL (ref 0.00–0.07)
Basophils Absolute: 0.1 10*3/uL (ref 0.0–0.1)
Basophils Relative: 1 %
Eosinophils Absolute: 0.2 10*3/uL (ref 0.0–0.5)
Eosinophils Relative: 6 %
HCT: 41.4 % (ref 39.0–52.0)
Hemoglobin: 14.1 g/dL (ref 13.0–17.0)
Immature Granulocytes: 1 %
Lymphocytes Relative: 31 %
Lymphs Abs: 1.2 10*3/uL (ref 0.7–4.0)
MCH: 31.5 pg (ref 26.0–34.0)
MCHC: 34.1 g/dL (ref 30.0–36.0)
MCV: 92.6 fL (ref 80.0–100.0)
Monocytes Absolute: 0.3 10*3/uL (ref 0.1–1.0)
Monocytes Relative: 8 %
Neutro Abs: 2.1 10*3/uL (ref 1.7–7.7)
Neutrophils Relative %: 53 %
Platelets: 202 10*3/uL (ref 150–400)
RBC: 4.47 MIL/uL (ref 4.22–5.81)
RDW: 13.6 % (ref 11.5–15.5)
WBC: 3.9 10*3/uL — ABNORMAL LOW (ref 4.0–10.5)
nRBC: 0 % (ref 0.0–0.2)

## 2022-07-11 LAB — ETHANOL: Alcohol, Ethyl (B): 33 mg/dL — ABNORMAL HIGH (ref <10)

## 2022-07-11 LAB — COMPREHENSIVE METABOLIC PANEL
ALT: 14 U/L (ref 0–44)
AST: 18 U/L (ref 15–41)
Albumin: 3.6 g/dL (ref 3.5–5.0)
Alkaline Phosphatase: 56 U/L (ref 38–126)
Anion gap: 7 (ref 5–15)
BUN: 11 mg/dL (ref 8–23)
CO2: 21 mmol/L — ABNORMAL LOW (ref 22–32)
Calcium: 10.8 mg/dL — ABNORMAL HIGH (ref 8.9–10.3)
Chloride: 111 mmol/L (ref 98–111)
Creatinine, Ser: 1.14 mg/dL (ref 0.61–1.24)
GFR, Estimated: >60 mL/min (ref >60)
Glucose, Bld: 89 mg/dL (ref 70–99)
Potassium: 3.6 mmol/L (ref 3.5–5.1)
Sodium: 139 mmol/L (ref 135–145)
Total Bilirubin: 0.4 mg/dL (ref 0.3–1.2)
Total Protein: 6.5 g/dL (ref 6.5–8.1)

## 2022-07-11 LAB — RAPID URINE DRUG SCREEN, HOSP PERFORMED
Amphetamines: NOT DETECTED
Barbiturates: NOT DETECTED
Benzodiazepines: NOT DETECTED
Cocaine: NOT DETECTED
Opiates: NOT DETECTED
Tetrahydrocannabinol: POSITIVE — AB

## 2022-07-11 NOTE — ED Notes (Signed)
Pt ambulatory to restroom w/out assistance. Pt given specimen cup and asked to provide sample. Pt agreeable.

## 2022-07-11 NOTE — ED Provider Notes (Signed)
[facility]EM[facility]GENCY DEPT Provider Note   CSN: [id number] Arrival date & time: 07/10/22  2052     History  Chief Complaint  Patient presents with   Alcohol Intoxication   Depression    Tim [name] is a 66 y.o. male.  The history is provided by the patient and medical records.  Alcohol Intoxication  Depression  Tim [name] is a 66 y.o. male who presents to the Emergency Department complaining of shoulder pain and drink too much.  In terms of his shoulder pain that started about a week ago after he hurt himself while weed eating.  Pain is worse with range of motion.  He is right-hand dominant.  He also states that he drank too much alcohol today and felt like he was losing himself.  He states that he wanted to leave the house so he was not home alone with a dog.  He has no SI.  He does not desire to stop drinking.  He is currently in the process of planning to move closer to family in [place] but this will not take place for a couple of weeks.  No associated fever, chest pain, nausea, vomiting, diarrhea.   No fever, chest pain, N/V/D.    Drinks alcohol - 12 pack daily, drank about 15 today.  Smokes tobacco and marijuana.    Lives with his dog.       Home Medications Prior to Admission medications   Medication Sig Start Date End Date Taking? Authorizing Provider  escitalopram (LEXAPRO) 20 MG tablet Take 1 tablet (20 mg total) by mouth daily. 04/28/22 06/10/22  Park Pope, MD  hydrOXYzine (VISTARIL) 50 MG capsule Take 1 capsule (50 mg total) by mouth 3 (three) times daily as needed for anxiety. 04/28/22   Park Pope, MD  ibuprofen (ADVIL) 200 MG tablet Take 400 mg by mouth every 8 (eight) hours as needed for moderate pain.    [provider]  nicotine (NICODERM CQ - DOSED IN MG/24 HOURS) 21 mg/24hr patch Place 1 patch (21 mg total) onto the skin daily. Patient not taking: Reported on 06/10/2022 04/28/22   Park Pope, MD  [facility] 160-4.5  MCG/ACT inhaler 2 puffs 2 (two) times daily. 06/01/21   [provider]      Allergies    Patient has no known allergies.    Review of Systems   Review of Systems  Psychiatric/Behavioral:  Positive for depression.   All other systems reviewed and are negative.   Physical Exam Updated Vital Signs BP 102/65   Pulse (!) 57   Temp 97.7 F (36.5 C) (Oral)   Resp 20   Ht 6\' 2"  (1.88 m)   Wt 81.6 kg   SpO2 93%   BMI 23.11 kg/m  Physical Exam Vitals and nursing note reviewed.  Constitutional:      Appearance: He is well-developed.  HENT:     Head: Normocephalic and atraumatic.  Cardiovascular:     Rate and Rhythm: Normal rate and regular rhythm.     Heart sounds: No murmur heard. Pulmonary:     Effort: Pulmonary effort is normal. No respiratory distress.     Breath sounds: Normal breath sounds.  Abdominal:     Palpations: Abdomen is soft.     Tenderness: There is no abdominal tenderness. There is no guarding or rebound.  Musculoskeletal:        General: No swelling or tenderness.     Comments: Full range of motion in the  right shoulder.  Skin:    General: Skin is warm and dry.  Neurological:     Mental Status: He is alert and oriented to person, place, and time.  Psychiatric:     Comments: Flat affect.  No active SI, HI.     ED Results / Procedures / Treatments   Labs (all labs ordered are listed, but only abnormal results are displayed) Labs Reviewed  COMPREHENSIVE METABOLIC PANEL - Abnormal; Notable for the following components:      Result Value   CO2 21 (*)    Calcium 10.8 (*)    All other components within normal limits  ETHANOL - Abnormal; Notable for the following components:   Alcohol, Ethyl (B) 33 (*)    All other components within normal limits  CBC WITH DIFFERENTIAL/PLATELET - Abnormal; Notable for the following components:   WBC 3.9 (*)    All other components within normal limits  RAPID URINE DRUG SCREEN, HOSP PERFORMED - Abnormal;  Notable for the following components:   Tetrahydrocannabinol POSITIVE (*)    All other components within normal limits    EKG EKG Interpretation  Date/Time:  Monday July 11 2022 02:55:47 EDT Ventricular Rate:  61 PR Interval:  157 QRS Duration: 87 QT Interval:  423 QTC Calculation: 427 R Axis:   86 Text Interpretation: Sinus rhythm Borderline right axis deviation RSR' in V1 or V2, probably normal variant Confirmed by Tilden Fossa [phone]) on 07/11/2022 4:24:13 AM  Radiology DG Shoulder Right  Result Date: 07/11/2022 CLINICAL DATA:  Right shoulder injury EXAM: RIGHT SHOULDER - 2+ VIEW COMPARISON:  None Available. FINDINGS: There is superior subluxation of the distal right clavicle in relation to the acromion. Corticated ossific density seen dorsal to the periarticular acromion at the acromioclavicular joint may represent a remote nonunited fracture fragment. Mild acromioclavicular degenerative arthritis. No acute fracture or dislocation. Glenohumeral joint space is preserved. Limited evaluation of the right hemithorax is unremarkable save for multiple healed right rib fractures. IMPRESSION: Superior subluxation of the distal right clavicle in relation to the acromion. This may be related to remote trauma. Superimposed mild acromioclavicular degenerative arthritis. Electronically Signed   By: Helyn Numbers M.D.   On: 07/11/2022 03:23    Procedures Procedures    Medications Ordered in ED Medications - No data to display  ED Course/ Medical Decision Making/ A&P                           Medical Decision Making Amount and/or Complexity of Data Reviewed Labs: ordered. Radiology: ordered.   Patient here for evaluation of right shoulder pain for 1 week as well as drinking too much alcohol today.  In terms of his shoulder pain he does have pain with range of motion but is able to fully range the shoulder.  X-ray with degenerative changes and superior clavicular subluxation.   Discussed with patient home care with OTC analgesics and orthopedics follow-up as needed.  In terms of his drinking too much, at time of assessment patient's alcohol level is very low and he is clinically sober.  He does not have any active SI, HI.  Offered outpatient resources.  Patient declined alcohol treatment at this time.       Final Clinical Impression(s) / ED Diagnoses Final diagnoses:  Strain of right shoulder, initial encounter  Alcohol use disorder    Rx / DC Orders ED Discharge Orders     None  Quintella Reichert, MD 07/11/22 [phone]

## 2022-07-19 ENCOUNTER — Ambulatory Visit (HOSPITAL_COMMUNITY)
Admission: EM | Admit: 2022-07-19 | Discharge: 2022-07-19 | Disposition: A | Discharge status: Home / Self Care | Payer: Medicaid Other | Attending: Nurse Practitioner | Admitting: Nurse Practitioner

## 2022-07-19 DIAGNOSIS — F1010 Alcohol abuse, uncomplicated: Secondary | ICD-10-CM | POA: Insufficient documentation

## 2022-07-19 DIAGNOSIS — F411 Generalized anxiety disorder: Secondary | ICD-10-CM | POA: Insufficient documentation

## 2022-07-19 DIAGNOSIS — R4781 Slurred speech: Secondary | ICD-10-CM | POA: Insufficient documentation

## 2022-07-19 DIAGNOSIS — F1414 Cocaine abuse with cocaine-induced mood disorder: Secondary | ICD-10-CM | POA: Insufficient documentation

## 2022-07-19 DIAGNOSIS — F329 Major depressive disorder, single episode, unspecified: Secondary | ICD-10-CM | POA: Insufficient documentation

## 2022-07-19 DIAGNOSIS — F1990 Other psychoactive substance use, unspecified, uncomplicated: Secondary | ICD-10-CM

## 2022-07-19 DIAGNOSIS — Z91148 Patient's other noncompliance with medication regimen for other reason: Secondary | ICD-10-CM | POA: Insufficient documentation

## 2022-07-19 DIAGNOSIS — F1210 Cannabis abuse, uncomplicated: Secondary | ICD-10-CM | POA: Insufficient documentation

## 2022-07-19 DIAGNOSIS — R45851 Suicidal ideations: Secondary | ICD-10-CM | POA: Insufficient documentation

## 2022-07-19 DIAGNOSIS — F1410 Cocaine abuse, uncomplicated: Secondary | ICD-10-CM | POA: Insufficient documentation

## 2022-07-19 DIAGNOSIS — F17200 Nicotine dependence, unspecified, uncomplicated: Secondary | ICD-10-CM | POA: Insufficient documentation

## 2022-07-19 NOTE — Discharge Instructions (Addendum)
  Discharge recommendations:  Patient is to take medications as prescribed. Please see information for follow-up appointment with psychiatry and therapy. Please follow up with your primary care provider for all medical related needs.   Therapy: We recommend that patient participate in individual therapy to address mental health concerns.  Medications: The parent/guardian is to contact a medical professional and/or outpatient provider to address any new side effects that develop. Parent/guardian should update outpatient providers of any new medications and/or medication changes.  Safety:  The patient should abstain from use of illicit substances/drugs and abuse of any medications. If symptoms worsen or do not continue to improve or if the patient becomes actively suicidal or homicidal then it is recommended that the patient return to the closest hospital emergency department, the [facility], or call 911 for further evaluation and treatment. National Suicide Prevention Lifeline 1-800-SUICIDE or 1-800-273-8255.  About 988 988 offers 24/7 access to trained crisis counselors who can help people experiencing mental health-related distress. People can call or text 988 or chat 988lifeline.org for themselves or if they are worried about a loved one who may need crisis support.  Crisis Mobile: [facility]:                     1-877-626-1772 (for crisis response 24 hours a day) [facility] Hotline:                                            1-800-256-2452  

## 2022-07-19 NOTE — Progress Notes (Signed)
Tim [name] was discharged with the sheriff to return him home. He received his AVS and personal belongings. He was discharged without incident.

## 2022-07-19 NOTE — ED Provider Notes (Signed)
Behavioral Health Urgent Care Medical Screening Exam  Patient Name: Tim [name] MRN: [id number] Date of Evaluation: 07/19/22 Chief Complaint:   Diagnosis:  Final diagnoses:  Alcohol abuse    History of Present illness: Tim [name] is a 66 y.o. male. ***  Psychiatric Specialty Exam  Presentation  General Appearance:Disheveled  Eye Contact:Fair  Speech:Normal Rate  Speech Volume:Normal  Handedness:Right   Mood and Affect  Mood: Euthymic  Affect: Congruent   Thought Process  Thought Processes: Coherent  Descriptions of Associations:Intact  Orientation:Full (Time, Place and Person)  Thought Content:WDL  Diagnosis of Schizophrenia or Schizoaffective disorder in past: No   Hallucinations:None  Ideas of Reference:None  Suicidal Thoughts:No  Homicidal Thoughts:No   Sensorium  Memory: Immediate Fair  Judgment: Intact  Insight: Present   Executive Functions  Concentration: Fair  Attention Span: Fair  Recall: [employer] of Knowledge: Fair  Language: Fair   Psychomotor Activity  Psychomotor Activity: Normal   Assets  Assets: Armed forces logistics/support/administrative officer; Desire for Improvement; Housing   Sleep  Sleep: Fair  Number of hours: No data recorded  Nutritional Assessment (For OBS and FBC admissions only) Has the patient had a weight loss or gain of 10 pounds or more in the last 3 months?: No Has the patient had a decrease in food intake/or appetite?: No Does the patient have dental problems?: No Does the patient have eating habits or behaviors that may be indicators of an eating disorder including binging or inducing vomiting?: No Has the patient recently lost weight without trying?: 0 Has the patient been eating poorly because of a decreased appetite?: 0 Malnutrition Screening Tool Score: 0    Physical Exam: Physical Exam ROS Blood pressure 96/71, pulse 72, temperature 98.2 F (36.8 C), temperature source Tympanic, SpO2 96  %. There is no height or weight on file to calculate BMI.  Musculoskeletal: Strength & Muscle Tone: {desc; muscle tone:32375} Gait & Station: {PE GAIT ED NATL:22525} Patient leans: {Patient Leans:21022755}   [place] MSE Discharge Disposition for Follow up and Recommendations: {BHUC MSE Recommendations:24277}   Lilana Blasko Carolan Shiver, NP 07/19/2022, 8:31 PM

## 2022-07-19 NOTE — Progress Notes (Addendum)
Addendum, patient denied SI, HI and hallucinations. Mortimer Fries, RN and [place], NP present during patient report. Patient routine.   07/19/22 1930  [place] Triage Screening (Walk-ins at [facility] only)  How Did You Hear About [place]? Self  What Is the Reason for Your Visit/Call Today? Tim [name] is a 66 year old male presenting voluntary, brought in by [employer], to [facility] due to [facility] with plan. Patient is intoxicated evidenced by garbled/slurred speech and self report. Patient reports drinking a 12 pack of beer approximately 3 hours ago. Patient is laying on floor during triage. Patient is difficult to understand. Patient is requesting medications. Patient also reports SI with plan to put a bullet in his brain. Patient also requesting to take a nap. Patient denied access to guns. Patient has presented to [facility] 06/09/22, 04/25/22, 04/15/22 with similar presentation. Patient unable to contract for safety. Patient is urgent.  How Long Has This Been Causing You Problems? 1-6 months  Have You Recently Had Any Thoughts About Hurting Yourself? Yes  How long ago did you have thoughts about hurting yourself? today  Are You Planning to [place] At This time? Yes  Have you Recently Had Thoughts About Hurting Someone Guadalupe Dawn? No  Are You Planning To Harm Someone At This Time? No  Are you currently experiencing any auditory, visual or other hallucinations? No  Have You Used Any Alcohol or Drugs in the Past 24 Hours? Yes  How long ago did you use Drugs or Alcohol? alcohol  What Did You Use and How Much? 12 pack of alcohol  Do you have any current medical co-morbidities that require immediate attention?  [name])  Clinician description of patient physical appearance/behavior: discheveled / intoxicated  What Do You Feel Would Help You the Most Today? Alcohol or Drug Use Treatment;Treatment for Depression or other mood problem  If access to [facility]  Urgent Care was not available, would you have sought care in the Emergency Department? Yes  Determination of Need Urgent (48 hours)  Options For Referral Inpatient Hospitalization;Medication Management;Outpatient Therapy;Other: Comment (Observation)

## 2022-08-16 ENCOUNTER — Other Ambulatory Visit: Payer: Self-pay

## 2022-08-16 ENCOUNTER — Ambulatory Visit (HOSPITAL_COMMUNITY)
Admission: EM | Admit: 2022-08-16 | Discharge: 2022-08-16 | Disposition: A | Discharge status: Home / Self Care | Payer: Medicaid Other | Attending: Psychiatry | Admitting: Psychiatry

## 2022-08-16 DIAGNOSIS — F1910 Other psychoactive substance abuse, uncomplicated: Secondary | ICD-10-CM | POA: Diagnosis not present

## 2022-08-16 DIAGNOSIS — Z1152 Encounter for screening for COVID-19: Secondary | ICD-10-CM | POA: Insufficient documentation

## 2022-08-16 DIAGNOSIS — Z79899 Other long term (current) drug therapy: Secondary | ICD-10-CM | POA: Diagnosis not present

## 2022-08-16 DIAGNOSIS — R4589 Other symptoms and signs involving emotional state: Secondary | ICD-10-CM

## 2022-08-16 DIAGNOSIS — F10129 Alcohol abuse with intoxication, unspecified: Secondary | ICD-10-CM | POA: Diagnosis not present

## 2022-08-16 LAB — LIPID PANEL
Cholesterol: 178 mg/dL (ref 0–200)
HDL: 64 mg/dL (ref >40)
LDL Cholesterol: 86 mg/dL (ref 0–99)
Total CHOL/HDL Ratio: 2.8 RATIO
Triglycerides: 138 mg/dL (ref <150)
VLDL: 28 mg/dL (ref 0–40)

## 2022-08-16 LAB — POCT URINE DRUG SCREEN - MANUAL ENTRY (I-SCREEN)
POC Amphetamine UR: NOT DETECTED
POC Buprenorphine (BUP): NOT DETECTED
POC Cocaine UR: NOT DETECTED
POC Marijuana UR: POSITIVE — AB
POC Methadone UR: NOT DETECTED
POC Methamphetamine UR: NOT DETECTED
POC Morphine: NOT DETECTED
POC Oxazepam (BZO): NOT DETECTED
POC Oxycodone UR: NOT DETECTED
POC Secobarbital (BAR): NOT DETECTED

## 2022-08-16 LAB — CBC WITH DIFFERENTIAL/PLATELET
Abs Immature Granulocytes: 0.01 10*3/uL (ref 0.00–0.07)
Basophils Absolute: 0.1 10*3/uL (ref 0.0–0.1)
Basophils Relative: 2 %
Eosinophils Absolute: 0.2 10*3/uL (ref 0.0–0.5)
Eosinophils Relative: 4 %
HCT: 47.5 % (ref 39.0–52.0)
Hemoglobin: 16.3 g/dL (ref 13.0–17.0)
Immature Granulocytes: 0 %
Lymphocytes Relative: 27 %
Lymphs Abs: 1.5 10*3/uL (ref 0.7–4.0)
MCH: 31.7 pg (ref 26.0–34.0)
MCHC: 34.3 g/dL (ref 30.0–36.0)
MCV: 92.4 fL (ref 80.0–100.0)
Monocytes Absolute: 0.4 10*3/uL (ref 0.1–1.0)
Monocytes Relative: 8 %
Neutro Abs: 3.4 10*3/uL (ref 1.7–7.7)
Neutrophils Relative %: 59 %
Platelets: 201 10*3/uL (ref 150–400)
RBC: 5.14 MIL/uL (ref 4.22–5.81)
RDW: 13.4 % (ref 11.5–15.5)
WBC: 5.7 10*3/uL (ref 4.0–10.5)
nRBC: 0 % (ref 0.0–0.2)

## 2022-08-16 LAB — COMPREHENSIVE METABOLIC PANEL
ALT: 16 U/L (ref 0–44)
AST: 22 U/L (ref 15–41)
Albumin: 3.8 g/dL (ref 3.5–5.0)
Alkaline Phosphatase: 60 U/L (ref 38–126)
Anion gap: 10 (ref 5–15)
BUN: 11 mg/dL (ref 8–23)
CO2: 26 mmol/L (ref 22–32)
Calcium: 10.4 mg/dL — ABNORMAL HIGH (ref 8.9–10.3)
Chloride: 101 mmol/L (ref 98–111)
Creatinine, Ser: 1.17 mg/dL (ref 0.61–1.24)
GFR, Estimated: >60 mL/min (ref >60)
Glucose, Bld: 114 mg/dL — ABNORMAL HIGH (ref 70–99)
Potassium: 4 mmol/L (ref 3.5–5.1)
Sodium: 137 mmol/L (ref 135–145)
Total Bilirubin: 0.4 mg/dL (ref 0.3–1.2)
Total Protein: 6.2 g/dL — ABNORMAL LOW (ref 6.5–8.1)

## 2022-08-16 LAB — RESP PANEL BY RT-PCR (FLU A&B, COVID) ARPGX2
Influenza A by PCR: NEGATIVE
Influenza B by PCR: NEGATIVE
SARS Coronavirus 2 by RT PCR: NEGATIVE

## 2022-08-16 LAB — ETHANOL: Alcohol, Ethyl (B): 218 mg/dL — ABNORMAL HIGH (ref <10)

## 2022-08-16 LAB — POC SARS CORONAVIRUS 2 AG: SARSCOV2ONAVIRUS 2 AG: NEGATIVE

## 2022-08-16 LAB — HEMOGLOBIN A1C
Hgb A1c MFr Bld: 4.9 % (ref 4.8–5.6)
Mean Plasma Glucose: 93.93 mg/dL

## 2022-08-16 MED ORDER — ALUM & MAG HYDROXIDE-SIMETH 200-200-20 MG/5ML PO SUSP: 30.0000 mL | ORAL | Status: DC | PRN

## 2022-08-16 MED ORDER — LORAZEPAM 1 MG PO TABS
1.0000 mg | ORAL_TABLET | Freq: Four times a day (QID) | ORAL | Status: DC
  Administered 2022-08-16: 1 mg via ORAL
  Filled 2022-08-16: qty 1

## 2022-08-16 MED ORDER — ACETAMINOPHEN 325 MG PO TABS: 650.0000 mg | ORAL_TABLET | Freq: Four times a day (QID) | ORAL | Status: DC | PRN

## 2022-08-16 MED ORDER — LORAZEPAM 1 MG PO TABS: 1.0000 mg | ORAL_TABLET | Freq: Three times a day (TID) | ORAL | Status: DC

## 2022-08-16 MED ORDER — LORAZEPAM 1 MG PO TABS: 1.0000 mg | ORAL_TABLET | Freq: Two times a day (BID) | ORAL | Status: DC

## 2022-08-16 MED ORDER — HYDROXYZINE HCL 25 MG PO TABS: 25.0000 mg | ORAL_TABLET | Freq: Four times a day (QID) | ORAL | Status: DC | PRN

## 2022-08-16 MED ORDER — THIAMINE MONONITRATE 100 MG PO TABS: 100.0000 mg | ORAL_TABLET | Freq: Every day | ORAL | Status: DC

## 2022-08-16 MED ORDER — ADULT MULTIVITAMIN W/MINERALS CH
1.0000 | ORAL_TABLET | Freq: Every day | ORAL | Status: DC
  Administered 2022-08-16: 1 via ORAL
  Filled 2022-08-16: qty 1

## 2022-08-16 MED ORDER — MAGNESIUM HYDROXIDE 400 MG/5ML PO SUSP: 30.0000 mL | Freq: Every day | ORAL | Status: DC | PRN

## 2022-08-16 MED ORDER — LORAZEPAM 1 MG PO TABS: 1.0000 mg | ORAL_TABLET | Freq: Every day | ORAL | Status: DC

## 2022-08-16 MED ORDER — ONDANSETRON 4 MG PO TBDP: 4.0000 mg | ORAL_TABLET | Freq: Four times a day (QID) | ORAL | Status: DC | PRN

## 2022-08-16 MED ORDER — THIAMINE HCL 100 MG/ML IJ SOLN
100.0000 mg | Freq: Once | INTRAMUSCULAR | Status: AC
  Administered 2022-08-16: 100 mg via INTRAMUSCULAR
  Filled 2022-08-16: qty 2

## 2022-08-16 MED ORDER — LOPERAMIDE HCL 2 MG PO CAPS: 2.0000 mg | ORAL_CAPSULE | ORAL | Status: DC | PRN

## 2022-08-16 MED ORDER — LORAZEPAM 1 MG PO TABS: 1.0000 mg | ORAL_TABLET | Freq: Four times a day (QID) | ORAL | Status: DC | PRN

## 2022-08-16 NOTE — ED Notes (Signed)
Pt is in the bed sleeping. Respirations are even and unlabored. No acute distress noted. Will continue to monitor for safety. 

## 2022-08-16 NOTE — ED Notes (Signed)
Pt is A & O x 2. Pt is oriented to the unit, refused meal. pt is lying on his bed. Pt denies SI/HI/AVH. Will continue to monitor.

## 2022-08-16 NOTE — ED Notes (Signed)
Snack given.

## 2022-08-16 NOTE — ED Notes (Signed)
Discharge instructions provided and Pt stated understanding. Pt alert, orient and ambulatory prior to d/c from facility. Personal belongings returned from locker number 46. Pt escorted to the lobby to d/c from facility. Taxi voucher provided and signed by driver. Original placed on board in chart room. Copy given to taxi driver. Safety maintained.

## 2022-08-16 NOTE — BH Assessment (Signed)
Comprehensive Clinical Assessment (CCA) Note  08/16/2022 Tim [name] [id number]  Disposition: Evette Georges, NP recommends pt to be admitted to [facility] for Continuous Assessment.   [place] ED from 08/16/2022 in [facility] ED from 07/10/2022 in [place] DEPT ED from 06/09/2022 in [place] No Risk No Risk Error: Q3, 4, or 5 should not be populated when Q2 is No      The patient demonstrates the following risk factors for suicide: Chronic risk factors for suicide include: substance use disorder. Acute risk factors for suicide include:  Pt denies, SI . Protective factors for this patient include:  Pt denies, SI . Considering these factors, the overall suicide risk at this point appears to be no risk. Patient is not appropriate for outpatient follow up.  Tim [name] is a 66 year old male who presents voluntary and unaccompanied to [place]. Clinician asked the pt, "what type of help are you seeking?" Pt replied, "I don't know." Pt reports, having access to knives. Pt denies, SI, HI, AVH, self-injurious behaviors.   Pt reports, drinking 24 cans of beer. Per chart, pt's UDS is positive for Marijuana. Pt's BA  is 218 at 0233.  During the assessment pt was a poor historian due to intoxication. Pt presents alert, in disheveled clothing with slurred, garbled speech. Pt's attempted to engage in assessment however due to his intoxication pt's responses to questions asked were unclear. Pt's mood was pleasant. Pt's affect was congruent. Pt's insight was lacking. Pt's judgement was impaired.   Diagnosis: Alcohol use Disorder, severe.  Chief Complaint: No chief complaint on file.  Visit Diagnosis:     CCA Screening, Triage and Referral (STR)  Patient Reported Information How did you hear about [place]? Self  What Is the Reason for Your Visit/Call Today? Pt presents to [facility]  voluntarily, accompanied by [facility] sherriff department. Patient is intoxicated evidenced by garbled/slurred speech and self report. Patient reports drinking a 15 pack of beer earlier tonight. Patient is difficult to understand due to current condition. Pt did deny SI, HI at this time.  How Long Has This Been Causing You Problems? <Week  What Do You Feel Would Help You the Most Today? Treatment for Depression or other mood problem; Alcohol or Drug Use Treatment   Have You Recently Had Any Thoughts About Hurting Yourself? No  Are You Planning to Commit Suicide/Harm Yourself At This time? No   Have you Recently Had Thoughts About [place]? No  Are You Planning to Harm Someone at This Time? No  Explanation: No data recorded  Have You Used Any Alcohol or Drugs in the Past 24 Hours? Yes  How Long Ago Did You Use Drugs or Alcohol? No data recorded What Did You Use and How Much? beer (12 or 15 pk)   Do You Currently Have a Therapist/Psychiatrist? No  Name of Therapist/Psychiatrist: No data recorded  Have You Been Recently Discharged From Any Office Practice or Programs? No  Explanation of Discharge From Practice/Program: No data recorded    CCA Screening Triage Referral Assessment Type of Contact: Face-to-Face  Telemedicine Service Delivery:   Is this Initial or Reassessment? No data recorded Date Telepsych consult ordered in CHL:  No data recorded Time Telepsych consult ordered in CHL:  No data recorded Location of Assessment: [facility] Assessment Services  Provider Location: [facility] Assessment Services   Collateral Involvement: none reported   Does Patient  Have a Automotive engineer Guardian? No  Legal Guardian Contact Information: No data recorded Copy of Legal Guardianship Form: No data recorded Legal Guardian Notified of Arrival: No data recorded Legal Guardian Notified of Pending Discharge: No data recorded If Minor and Not Living with Parent(s), Who has  Custody? No data recorded Is CPS involved or ever been involved? No data recorded Is APS involved or ever been involved? No data recorded  Patient Determined To Be At Risk for Harm To Self or Others Based on Review of Patient Reported Information or Presenting Complaint? No data recorded Method: No data recorded Availability of Means: No data recorded Intent: No data recorded Notification Required: No data recorded Additional Information for Danger to Others Potential: No data recorded Additional Comments for Danger to Others Potential: No data recorded Are There Guns or Other Weapons in Your Home? No data recorded Types of Guns/Weapons: No data recorded Are These Weapons Safely Secured?                            No data recorded Who Could Verify You Are Able To Have These Secured: No data recorded Do You Have any Outstanding Charges, Pending Court Dates, Parole/Probation? No data recorded Contacted To Inform of Risk of Harm To Self or Others: No data recorded   Does Patient Present under Involuntary Commitment? No  IVC Papers Initial File Date: No data recorded  [place] of Residence: [place]   Patient Currently Receiving the Following Services: Medication Management   Determination of Need: Urgent (48 hours)   Options For Referral: Other: Comment; BH Urgent Care; Outpatient Therapy; Medication Management; Chemical Dependency Intensive Outpatient Therapy (CDIOP)     CCA Biopsychosocial Patient Reported Schizophrenia/Schizoaffective Diagnosis in Past: No   Strengths: Pt advocates from himself.   Mental Health Symptoms Depression:   Difficulty Concentrating; Increase/decrease in appetite   Duration of Depressive symptoms:    Mania:   -- (UTA due to intoxication.)   Anxiety:    -- (UTA due to intoxication.)   Psychosis:   None   Duration of Psychotic symptoms:    Trauma:   -- (UTA due to intoxication.)   Obsessions:   None   Compulsions:   -- (UTA due  to intoxication.)   Inattention:   -- (UTA due to intoxication.)   Hyperactivity/Impulsivity:   -- (UTA due to intoxication.)   Oppositional/Defiant Behaviors:   -- (UTA due to intoxication.)   Emotional Irregularity:   -- (UTA due to intoxication.)   Other Mood/Personality Symptoms:   None    Mental Status Exam Appearance and self-care  Stature:   Tall   Weight:   Average weight   Clothing:   Disheveled   Grooming:   Neglected   Cosmetic use:   None   Posture/gait:   Normal   Motor activity:   Not Remarkable   Sensorium  Attention:   Confused   Concentration:   Focuses on irrelevancies   Orientation:   Person; Place   Recall/memory:   Defective in Immediate   Affect and Mood  Affect:   Appropriate; Depressed   Mood:   Depressed   Relating  Eye contact:   Normal   Facial expression:   Responsive   Attitude toward examiner:   Cooperative   Thought and Language  Speech flow:  Slurred; Garbled   Thought content:   Appropriate to Mood and Circumstances   Preoccupation:   None  Hallucinations:   None   Organization:   Circumstantial   Company secretary of Knowledge:   Poor   Intelligence:   Average   Abstraction:   Armed forces technical officer:   Impaired   Reality Testing:   -- (UTA due to intoxication.)   Insight:   Lacking   Decision Making:   Impulsive   Social Functioning  Social Maturity:   -- (UTA due to intoxication.)   Social Judgement:   "Chief of Staff"   Stress  Stressors:   Other (Comment) (UTA due to intoxication.)   Coping Ability:   Exhausted; Overwhelmed; Deficient supports   Skill Deficits:   Communication; Decision making (UTA due to intoxication.)   Supports:   -- (UTA due to intoxication.)     Religion: Religion/Spirituality Are You A Religious Person?:  (UTA due to intoxication.)  Leisure/Recreation: Leisure / Recreation Do You Have Hobbies?:  (UTA due to  intoxication.)  Exercise/Diet: Exercise/Diet Have You Gained or Lost A Significant Amount of Weight in the Past Six Months?:  (UTA due to intoxication.) Do You Follow a Special Diet?:  (UTA due to intoxication.) Do You Have Any Trouble Sleeping?:  (Pt reports, not sleeping.)   CCA Employment/Education Employment/Work Situation: Employment / Work Psychologist, occupational Employment Situation: Retired Passenger transport manager has Been Impacted by Current Illness: No Has Patient ever Been in Equities trader?: No  Education: Education Is Patient Currently Attending School?: No Last Grade Completed:  (GED.) Did You Attend College?: Yes 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 Did You Have An Individualized Education Program (IIEP):  (UTA due to intoxication.) Did You Have Any Difficulty At School?:  (UTA due to intoxication.) Patient's Education Has Been Impacted by Current Illness:  (UTA due to intoxication.)   CCA Family/Childhood History Family and Relationship History: Family history Marital status:  (UTA due to intoxication.) Does patient have children?:  (UTA due to intoxication.)  Childhood History:  Childhood History By whom was/is the patient raised?:  (UTA due to intoxication.) Did patient suffer any verbal/emotional/physical/sexual abuse as a child?:  (UTA due to intoxication.) Did patient suffer from severe childhood neglect?:  (UTA due to intoxication.) Has patient ever been sexually abused/assaulted/raped as an adolescent or adult?:  (UTA due to intoxication.) Was the patient ever a victim of a crime or a disaster?:  (UTA due to intoxication.) Witnessed domestic violence?:  (UTA due to intoxication.) Has patient been affected by domestic violence as an adult?:  (UTA due to intoxication.)  Child/Adolescent Assessment:     CCA Substance Use Alcohol/Drug Use: Alcohol / Drug Use Pain Medications: See MAR Prescriptions: See MAR Over the Counter: See MAR History of alcohol /  drug use?: Yes Longest period of sobriety (when/how long): UTA due to intoxication. Negative Consequences of Use:  (UTA due to intoxication.) Withdrawal Symptoms: Other (Comment) (UTA due to intoxication.) Substance #1 Name of Substance 1: Alcohol. 1 - Age of First Use: UTA 1 - Amount (size/oz): Pt reports, drinking 24 beers. 1 - Frequency: Ongoing. 1 - Duration: Ongoing. 1 - Last Use / Amount: Tonight. 1 - Method of Aquiring: UTA 1- Route of Use: Oral.    ASAM's:  Six Dimensions of Multidimensional Assessment  Dimension 1:  Acute Intoxication and/or Withdrawal Potential:   Dimension 1:  Description of individual's past and current experiences of substance use and withdrawal: During the assessment pt did not disclose experiencing withdrawal symptoms.  Dimension 2:  Biomedical Conditions and Complications:   Dimension 2:  Description of patient's biomedical conditions and  complications: Per chart, pt has the following medical concerns: Incarcerated right inguinal hernia, Inguinal hernia with gangrene, Hepatitis C.  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  Dimension 4:  Readiness to Change:  Dimension 4:  Description of Readiness to Change criteria: During the assessment pt did not disclose wanting to be sober.  Dimension 5:  Relapse, Continued use, or Continued Problem Potential:  Dimension 5:  Relapse, continued use, or continued problem potential critiera description: Pt has ongoing substance use including Alcohol, Marijuana, Cocaine.   Dimension 6:  Recovery/Living Environment:  Dimension 6:  Recovery/Iiving environment criteria description: Pt reports, he lives alone.  ASAM Severity Score:    ASAM Recommended Level of Treatment:     Substance use Disorder (SUD) Substance Use Disorder (SUD)  Checklist Symptoms of Substance Use: Continued use despite having a persistent/recurrent physical/psychological problem caused/exacerbated by use, Evidence of tolerance, Continued use despite persistent or recurrent social, interpersonal problems, caused or exacerbated by use, Presence of craving or strong urge to use  Recommendations for Services/Supports/Treatments: Recommendations for Services/Supports/Treatments Recommendations For Services/Supports/Treatments: Other (Comment) ([facility]  Discharge Disposition:    DSM5 Diagnoses: Patient Active Problem List   Diagnosis Date Noted   Alcohol abuse 04/26/2022   Incarcerated right inguinal hernia 10/10/2021   Inguinal hernia with gangrene 10/09/2021   Substance induced mood disorder (HCC) 02/14/2020   MDD (major depressive disorder), severe (HCC) 04/10/2019   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/2018   Cocaine abuse with cocaine-induced mood disorder (HCC) 01/29/2018   Polysubstance dependence (HCC) 01/29/2018   Severe recurrent major depression without psychotic features (HCC) 08/20/2017   Severe episode of recurrent major depressive disorder, without psychotic features (HCC) 07/08/2016   MDD (major depressive disorder), recurrent severe, without psychosis (HCC) 05/23/2016   Hepatitis C 05/23/2016   Alcohol dependence with uncomplicated intoxication (HCC) 11/05/2015   Depression, major, recurrent, moderate (HCC) 11/05/2015   GAD (generalized anxiety disorder) 11/05/2015   Cellulitis of right lower extremity 03/29/2015     Referrals to Alternative Service(s): Referred to Alternative  Service(s):   Place:   Date:   Time:    Referred to Alternative Service(s):   Place:   Date:   Time:    Referred to Alternative Service(s):   Place:   Date:   Time:    Referred to Alternative Service(s):   Place:   Date:   Time:     Redmond Pullingreylese D Glendola Friedhoff, [facility] Comprehensive Clinical Assessment (CCA) Screening, Triage and Referral Note  08/16/2022 Tim LandauJeffrey L [name] [id number]  Chief Complaint: No chief complaint on file.  Visit Diagnosis:   Patient Reported Information How did you hear about us? Self  What Is the Reason for Your Visit/Call Today? [facility]. Patient is intoxicated evidenced by garbled/slurred speech and self report. Patient reports drinking a 15 pack of beer earlier tonight. Patient is difficult to understand due to current condition. Pt did deny SI, HI at this time.  How Long Has This Been Causing You Problems? <Week  What Do You Feel Would Help  You the Most Today? Treatment for Depression or other mood problem; Alcohol or Drug Use Treatment   Have You Recently Had Any Thoughts About Hurting Yourself? No  Are You Planning to Commit Suicide/Harm Yourself At This time? No   Have you Recently Had Thoughts About Hurting Someone [name]? No  Are You Planning to Harm Someone at This Time? No  Explanation: No data recorded  Have You Used Any Alcohol or Drugs in the Past 24 Hours? Yes  How Long Ago Did You Use Drugs or Alcohol? No data recorded What Did You Use and How Much? beer (12 or 15 pk)   Do You Currently Have a Therapist/Psychiatrist? No  Name of Therapist/Psychiatrist: No data recorded  Have You Been Recently Discharged From Any Office Practice or Programs? No  Explanation of Discharge From Practice/Program: No data recorded   CCA Screening Triage Referral Assessment Type of Contact: Face-to-Face  Telemedicine Service Delivery:   Is this Initial or Reassessment? No data recorded Date Telepsych  consult ordered in CHL:  No data recorded Time Telepsych consult ordered in CHL:  No data recorded Location of Assessment: [facility] Assessment Services  Provider Location: [facility] Assessment Services   Collateral Involvement: none reported   Does Patient Have a Automotive engineer Guardian? No data recorded Name and Contact of Legal Guardian: No data recorded If Minor and Not Living with Parent(s), Who has Custody? No data recorded Is CPS involved or ever been involved? No data recorded Is APS involved or ever been involved? No data recorded  Patient Determined To Be At Risk for Harm To Self or Others Based on Review of Patient Reported Information or Presenting Complaint? No data recorded Method: No data recorded Availability of Means: No data recorded Intent: No data recorded Notification Required: No data recorded Additional Information for Danger to Others Potential: No data recorded Additional Comments for Danger to Others Potential: No data recorded Are There Guns or Other Weapons in Your Home? No data recorded Types of Guns/Weapons: No data recorded Are These Weapons Safely Secured?                            No data recorded Who Could Verify You Are Able To Have These Secured: No data recorded Do You Have any Outstanding Charges, Pending Court Dates, Parole/Probation? No data recorded Contacted To Inform of Risk of Harm To Self or Others: No data recorded  Does Patient Present under Involuntary Commitment? No  IVC Papers Initial File Date: No data recorded  [place] of Residence: [place]   Patient Currently Receiving the Following Services: Medication Management   Determination of Need: Urgent (48 hours)   Options For Referral: Other: Comment; BH Urgent Care; Outpatient Therapy; Medication Management; Chemical Dependency Intensive Outpatient Therapy (CDIOP)   Discharge Disposition:     Redmond Pulling, [facility]       Redmond Pulling, MS, [facility], [facility] Triage  Specialist [phone]

## 2022-08-16 NOTE — Discharge Instructions (Addendum)
Dear [name],  We attempted to schedule you for the substance abuse treatment program for medicaid patients at the [facility]. They only offer in person groups. Because you have said that you do not have access to transportation, we did not schedule you for an intake since this does not seem to be an option. We encourage you to attempt to get transportation via medicaid by calling your medicaid LME. Please call [phone] to get scheduled for the substance abuse program at the [facility] when ready.   You can always walk in to this facility ([facility]) or call 911 or go to any emergency department for mental health help.   Patient is instructed prior to discharge to:  Take all medications as prescribed by his/her mental healthcare provider. Report any adverse effects and or reactions from the medicines to his/her outpatient provider promptly. Keep all scheduled appointments, to ensure that you are getting refills on time and to avoid any interruption in your medication.  If you are unable to keep an appointment call to reschedule.  Be sure to follow-up with resources and follow-up appointments provided.  Patient has been instructed & cautioned: To not engage in alcohol and or illegal drug use while on prescription medicines. In the event of worsening symptoms, patient is instructed to call the crisis hotline, 911 and or go to the nearest ED for appropriate evaluation and treatment of symptoms. To follow-up with his/her primary care provider for your other medical issues, concerns and or health care needs.

## 2022-08-16 NOTE — ED Provider Notes (Signed)
[facility] Continuous Assessment Admission H&P  Date: 08/16/22 Patient Name: Tim LandauJeffrey L [name] MRN: [id number] Chief Complaint: No chief complaint on file.     Diagnoses:  Final diagnoses:  Alcohol abuse with intoxication (HCC)  Anxious appearance  Substance abuse (HCC)    HPI: Tim HoopsJeffrey [name], 66 y.o male with a history of MDD, GAD, polysubstance use and alcohol abuse [facility].  Patient is clearly intoxicated.  The patient he drank 1 case before cans of beer.  Chart reviewed so patient was 07/19/22 for alcohol intoxication.  Copied from triage note: [facility]. Patient is intoxicated evidenced by garbled/slurred speech and self report. Patient reports drinking a 15 pack of beer earlier tonight. Patient is difficult to understand due to current condition. Pt did deny SI, HI at this time.  Face-to-face observation of patient, patient is alert and oriented to person upon entering the room patient is observed in the chair.  Patient's speech is garbled, patient is intoxicated at this time and a complete assessment cannot be done because of patient application.Marland Kitchen.  However patient was able to answer the following question, patient denies SI, HI, AVH or paranoia.  Patient reports he drank alcohol tonight 1 case of beer, smoking.  Recommend inpatient observation with reevaluation when patient is more sober.  PHQ 2-9:  [facility]  Thoughts that you would be better off dead, or of hurting yourself in some way Not at all  PHQ-9 Total Score 22       Flowsheet Row [facility]  C-SSRS RISK CATEGORY No Risk No Risk Error: Q3, 4, or 5 should not be populated when Q2 is No        Total  Time spent with patient: 20 minutes  Musculoskeletal  Strength & Muscle Tone: within normal limits Gait & Station: normal Patient leans: N/A  Psychiatric Specialty Exam  Presentation General Appearance:  Bizarre  Eye Contact: Fair  Speech: Garbled  Speech Volume: Decreased  Handedness: Right   Mood and Affect  Mood: Anxious  Affect: Flat   Thought Process  Thought Processes: Disorganized  Descriptions of Associations:Circumstantial  Orientation:Full (Time, Place and Person)  Thought Content:Tangential  Diagnosis of Schizophrenia or Schizoaffective disorder in past: No   Hallucinations:Hallucinations: None  Ideas of Reference:None  Suicidal Thoughts:Suicidal Thoughts: No  Homicidal Thoughts:Homicidal Thoughts: No   Sensorium  Memory: Immediate Fair  Judgment: Poor  Insight: Fair   Chartered certified accountantxecutive Functions  Concentration: Fair  Attention Span: Fair  Recall: Fair  Fund of Knowledge: Fair  Language: Fair   Psychomotor Activity  Psychomotor Activity: Psychomotor Activity: Normal   Assets  Assets: Desire for Improvement   Sleep  Sleep: Sleep: Poor Number of Hours of Sleep: 5   Nutritional Assessment (For OBS and FBC admissions only) Has the patient had a weight loss or gain of 10 pounds or more in the last 3 months?: No Has the patient had a decrease in food intake/or appetite?: No Does the patient have dental problems?: No Does the patient have eating habits or behaviors that may be indicators of an eating disorder including binging or inducing vomiting?: No Has the patient recently lost weight without trying?: 0 Has the patient been eating poorly because of a decreased appetite?: 0  Malnutrition Screening Tool Score: 0    Physical Exam HENT:     Head: Normocephalic.     Nose: Nose normal.  Cardiovascular:     Rate and Rhythm: Normal rate.  Musculoskeletal:        General: Normal range of motion.     Cervical  back: Normal range of motion.  Neurological:     General: No focal deficit present.     Mental Status: He is alert.  Psychiatric:        Mood and Affect: Mood normal.        Behavior: Behavior normal.        Thought Content: Thought content normal.        Judgment: Judgment normal.    Review of Systems  Constitutional: Negative.   HENT: Negative.    Eyes: Negative.   Respiratory: Negative.    Cardiovascular: Negative.   Gastrointestinal: Negative.   Genitourinary: Negative.   Musculoskeletal: Negative.   Skin: Negative.   Neurological: Negative.   Endo/Heme/Allergies: Negative.   Psychiatric/Behavioral:  Positive for substance abuse. The patient is nervous/anxious.     Blood pressure 98/68, pulse 62, temperature 98.9 F (37.2 C), temperature source Oral, resp. rate 20, SpO2 97 %. There is no height or weight on file to calculate BMI.  Past Psychiatric History: Alcohol abuse, MDD, GAD,  Is the patient at risk to self? No  Has the patient been a risk to self in the past 6 months? No .    Has the patient been a risk to self within the distant past? No   Is the patient a risk to others? No   Has the patient been a risk to others in the past 6 months? No   Has the patient been a risk to others within the distant past? No   Past Medical History:  Past Medical History:  Diagnosis Date   Alcoholism (HCC)    Anxiety    COPD (chronic obstructive pulmonary disease) (HCC)    Depression    Hep C w/o coma, chronic (HCC)    History of hiatal hernia    hernia rt groin   Mental health disorder    Scoliosis    Thyroid disease     Past Surgical History:  Procedure Laterality Date   INGUINAL HERNIA REPAIR Right 10/09/2021   Procedure: HERNIA REPAIR INGUINAL INCARCERATED;  Surgeon: Harriette Bouillon, MD;  Location: [facility];  Service: General;  Laterality: Right;   SKIN CANCER EXCISION     THYROID SURGERY      Family History:  Family History  Problem Relation Age of Onset    Dementia Mother     Social History:  Social History   Socioeconomic History   Marital status: Single    Spouse name: Not on file   Number of children: Not on file   Years of education: Not on file   Highest education level: Not on file  Occupational History   Not on file  Tobacco Use   Smoking status: Every Day    Packs/day: 0.50    Years: 40.00    Total pack years: 20.00    Types: Cigarettes   Smokeless tobacco: Never   Tobacco comments:    Patient refused  Vaping Use   Vaping Use: Never used  Substance and Sexual Activity   Alcohol use: Yes   Drug use: Yes    Types: Marijuana   Sexual activity: Never    Birth control/protection: None  Other Topics  Concern   Not on file  Social History Narrative   ** Merged History Encounter **       Social Determinants of Health   Financial Resource Strain: Not on file  Food Insecurity: Not on file  Transportation Needs: Not on file  Physical Activity: Not on file  Stress: Not on file  Social Connections: Not on file  Intimate Partner Violence: Not on file    SDOH:  SDOH Screenings   Alcohol Screen: Medium Risk (02/14/2020)  Depression (PHQ2-9): High Risk (04/28/2022)  Tobacco Use: High Risk (07/10/2022)    Last Labs:  Admission on 08/16/2022  Component Date Value Ref Range Status   Alcohol, Ethyl (B) 08/16/2022 218 (H)  <10 mg/dL Final   Comment: (NOTE) Lowest detectable limit for serum alcohol is 10 mg/dL.  For medical purposes only. Performed at [facility], [address], [place], [place] [address]    POC Amphetamine UR 08/16/2022 None Detected  NONE DETECTED (Cut Off Level 1000 ng/mL) Final   POC Secobarbital (BAR) 08/16/2022 None Detected  NONE DETECTED (Cut Off Level 300 ng/mL) Final   POC Buprenorphine (BUP) 08/16/2022 None Detected  NONE DETECTED (Cut Off Level 10 ng/mL) Final   POC Oxazepam (BZO) 08/16/2022 None Detected  NONE DETECTED (Cut Off Level 300 ng/mL) Final   POC Cocaine UR 08/16/2022  None Detected  NONE DETECTED (Cut Off Level 300 ng/mL) Final   POC Methamphetamine UR 08/16/2022 None Detected  NONE DETECTED (Cut Off Level 1000 ng/mL) Final   POC Morphine 08/16/2022 None Detected  NONE DETECTED (Cut Off Level 300 ng/mL) Final   POC Methadone UR 08/16/2022 None Detected  NONE DETECTED (Cut Off Level 300 ng/mL) Final   POC Oxycodone UR 08/16/2022 None Detected  NONE DETECTED (Cut Off Level 100 ng/mL) Final   POC Marijuana UR 08/16/2022 Positive (A)  NONE DETECTED (Cut Off Level 50 ng/mL) Final   SARS Coronavirus 2 by RT PCR 08/16/2022 NEGATIVE  NEGATIVE Final   Comment: (NOTE) SARS-CoV-2 target nucleic acids are NOT DETECTED.  The SARS-CoV-2 RNA is generally detectable in upper respiratory specimens during the acute phase of infection. The lowest concentration of SARS-CoV-2 viral copies this assay can detect is 138 copies/mL. A negative result does not preclude SARS-Cov-2 infection and should not be used as the sole basis for treatment or other patient management decisions. A negative result may occur with  improper specimen collection/handling, submission of specimen other than nasopharyngeal swab, presence of viral mutation(s) within the areas targeted by this assay, and inadequate number of viral copies(<138 copies/mL). A negative result must be combined with clinical observations, patient history, and epidemiological information. The expected result is Negative.  Fact Sheet for Patients:  BloggerCourse.com  Fact Sheet for Healthcare Providers:  SeriousBroker.it  This test is no                          t yet approved or cleared by the [place] FDA and  has been authorized for detection and/or diagnosis of SARS-CoV-2 by FDA under an Emergency Use Authorization (EUA). This EUA will remain  in effect (meaning this test can be used) for the duration of the COVID-19 declaration under Section 564(b)(1) of the Act,  21 U.S.C.section 360bbb-3(b)(1), unless the authorization is terminated  or revoked sooner.       Influenza A by PCR 08/16/2022 NEGATIVE  NEGATIVE Final   Influenza B by PCR 08/16/2022 NEGATIVE  NEGATIVE Final  Comment: (NOTE) The Xpert Xpress SARS-CoV-2/FLU/RSV plus assay is intended as an aid in the diagnosis of influenza from Nasopharyngeal swab specimens and should not be used as a sole basis for treatment. Nasal washings and aspirates are unacceptable for Xpert Xpress SARS-CoV-2/FLU/RSV testing.  Fact Sheet for Patients: BloggerCourse.com  Fact Sheet for Healthcare Providers: SeriousBroker.it  This test is not yet approved or cleared by the [place] FDA and has been authorized for detection and/or diagnosis of SARS-CoV-2 by FDA under an Emergency Use Authorization (EUA). This EUA will remain in effect (meaning this test can be used) for the duration of the COVID-19 declaration under Section 564(b)(1) of the Act, 21 U.S.C. section 360bbb-3(b)(1), unless the authorization is terminated or revoked.  Performed at [facility], [address], [place], [place] [address]    WBC 08/16/2022 5.7  4.0 - 10.5 K/uL Final   RBC 08/16/2022 5.14  4.22 - 5.81 MIL/uL Final   Hemoglobin 08/16/2022 16.3  13.0 - 17.0 g/dL Final   HCT 60/45/4098 47.5  39.0 - 52.0 % Final   MCV 08/16/2022 92.4  80.0 - 100.0 fL Final   MCH 08/16/2022 31.7  26.0 - 34.0 pg Final   MCHC 08/16/2022 34.3  30.0 - 36.0 g/dL Final   RDW 11/91/4782 13.4  11.5 - 15.5 % Final   Platelets 08/16/2022 201  150 - 400 K/uL Final   nRBC 08/16/2022 0.0  0.0 - 0.2 % Final   Neutrophils Relative % 08/16/2022 59  % Final   Neutro Abs 08/16/2022 3.4  1.7 - 7.7 K/uL Final   Lymphocytes Relative 08/16/2022 27  % Final   Lymphs Abs 08/16/2022 1.5  0.7 - 4.0 K/uL Final   Monocytes Relative 08/16/2022 8  % Final   Monocytes Absolute 08/16/2022 0.4  0.1 - 1.0 K/uL Final    Eosinophils Relative 08/16/2022 4  % Final   Eosinophils Absolute 08/16/2022 0.2  0.0 - 0.5 K/uL Final   Basophils Relative 08/16/2022 2  % Final   Basophils Absolute 08/16/2022 0.1  0.0 - 0.1 K/uL Final   Immature Granulocytes 08/16/2022 0  % Final   Abs Immature Granulocytes 08/16/2022 0.01  0.00 - 0.07 K/uL Final   Performed at [facility], [address] [place], [place] [address]   Sodium 08/16/2022 137  135 - 145 mmol/L Final   Potassium 08/16/2022 4.0  3.5 - 5.1 mmol/L Final   Chloride 08/16/2022 101  98 - 111 mmol/L Final   CO2 08/16/2022 26  22 - 32 mmol/L Final   Glucose, Bld 08/16/2022 114 (H)  70 - 99 mg/dL Final   Glucose reference range applies only to samples taken after fasting for at least 8 hours.   BUN 08/16/2022 11  8 - 23 mg/dL Final   Creatinine, Ser 08/16/2022 1.17  0.61 - 1.24 mg/dL Final   Calcium 30/86/5784 10.4 (H)  8.9 - 10.3 mg/dL Final   Total Protein 69/62/9528 6.2 (L)  6.5 - 8.1 g/dL Final   Albumin 41/32/4401 3.8  3.5 - 5.0 g/dL Final   AST 02/72/5366 22  15 - 41 U/L Final   ALT 08/16/2022 16  0 - 44 U/L Final   Alkaline Phosphatase 08/16/2022 60  38 - 126 U/L Final   Total Bilirubin 08/16/2022 0.4  0.3 - 1.2 mg/dL Final   GFR, Estimated 08/16/2022 >60  >60 mL/min Final   Comment: (NOTE) Calculated using the CKD-EPI Creatinine Equation (2021)    Anion gap 08/16/2022  10  5 - 15 Final   Performed at [facility], [address] [place], [place] [address]   Hgb A1c MFr Bld 08/16/2022 4.9  4.8 - 5.6 % Final   Comment: (NOTE) Pre diabetes:          5.7%-6.4%  Diabetes:              >6.4%  Glycemic control for   <7.0% adults with diabetes    Mean Plasma Glucose 08/16/2022 93.93  mg/dL Final   Performed at [facility], [address], [place], [place] [address]   SARSCOV2ONAVIRUS 2 AG 08/16/2022 NEGATIVE  NEGATIVE Final   Comment: (NOTE) SARS-CoV-2 antigen NOT DETECTED.   Negative results are presumptive.  Negative results  do not preclude SARS-CoV-2 infection and should not be used as the sole basis for treatment or other patient management decisions, including infection  control decisions, particularly in the presence of clinical signs and  symptoms consistent with COVID-19, or in those who have been in contact with the virus.  Negative results must be combined with clinical observations, patient history, and epidemiological information. The expected result is Negative.  Fact Sheet for Patients: https://www.jennings-kim.com/  Fact Sheet for Healthcare Providers: https://alexander-rogers.biz/  This test is not yet approved or cleared by the [place] FDA and  has been authorized for detection and/or diagnosis of SARS-CoV-2 by FDA under an Emergency Use Authorization (EUA).  This EUA will remain in effect (meaning this test can be used) for the duration of  the COV                          ID-19 declaration under Section 564(b)(1) of the Act, 21 U.S.C. section 360bbb-3(b)(1), unless the authorization is terminated or revoked sooner.     Cholesterol 08/16/2022 178  0 - 200 mg/dL Final   Triglycerides 08/65/7846 138  <150 mg/dL Final   HDL 96/29/5284 64  >40 mg/dL Final   Total CHOL/HDL Ratio 08/16/2022 2.8  RATIO Final   VLDL 08/16/2022 28  0 - 40 mg/dL Final   LDL Cholesterol 08/16/2022 86  0 - 99 mg/dL Final   Comment:        Total Cholesterol/HDL:CHD Risk Coronary Heart Disease Risk Table                     Men   Women  1/2 Average Risk   3.4   3.3  Average Risk       5.0   4.4  2 X Average Risk   9.6   7.1  3 X Average Risk  23.4   11.0        Use the calculated Patient Ratio above and the CHD Risk Table to determine the patient's CHD Risk.        ATP III CLASSIFICATION (LDL):  <100     mg/dL   Optimal  132-440  mg/dL   Near or Above                    Optimal  130-159  mg/dL   Borderline  102-725  mg/dL   High  >366     mg/dL   Very High Performed at  [facility], [address], [place], [place] [address]   Admission on 07/10/2022, Discharged on 07/11/2022  Component Date Value Ref Range Status   Sodium 07/11/2022 139  135 - 145 mmol/L Final  Potassium 07/11/2022 3.6  3.5 - 5.1 mmol/L Final   Chloride 07/11/2022 111  98 - 111 mmol/L Final   CO2 07/11/2022 21 (L)  22 - 32 mmol/L Final   Glucose, Bld 07/11/2022 89  70 - 99 mg/dL Final   Glucose reference range applies only to samples taken after fasting for at least 8 hours.   BUN 07/11/2022 11  8 - 23 mg/dL Final   Creatinine, Ser 07/11/2022 1.14  0.61 - 1.24 mg/dL Final   Calcium 32/44/0102 10.8 (H)  8.9 - 10.3 mg/dL Final   Total Protein 72/53/6644 6.5  6.5 - 8.1 g/dL Final   Albumin 03/47/4259 3.6  3.5 - 5.0 g/dL Final   AST 56/38/7564 18  15 - 41 U/L Final   ALT 07/11/2022 14  0 - 44 U/L Final   Alkaline Phosphatase 07/11/2022 56  38 - 126 U/L Final   Total Bilirubin 07/11/2022 0.4  0.3 - 1.2 mg/dL Final   GFR, Estimated 07/11/2022 >60  >60 mL/min Final   Comment: (NOTE) Calculated using the CKD-EPI Creatinine Equation (2021)    Anion gap 07/11/2022 7  5 - 15 Final   Performed at [facility], [address], [place], [place] [address]   Alcohol, Ethyl (B) 07/11/2022 33 (H)  <10 mg/dL Final   Comment: (NOTE) Lowest detectable limit for serum alcohol is 10 mg/dL.  For medical purposes only. Performed at [facility], [address] [place], [place] [address]    WBC 07/11/2022 3.9 (L)  4.0 - 10.5 K/uL Final   RBC 07/11/2022 4.47  4.22 - 5.81 MIL/uL Final   Hemoglobin 07/11/2022 14.1  13.0 - 17.0 g/dL Final   HCT 66/03/3015 41.4  39.0 - 52.0 % Final   MCV 07/11/2022 92.6  80.0 - 100.0 fL Final   MCH 07/11/2022 31.5  26.0 - 34.0 pg Final   MCHC 07/11/2022 34.1  30.0 - 36.0 g/dL Final   RDW 10/25/3233 13.6  11.5 - 15.5 % Final   Platelets 07/11/2022 202  150 - 400 K/uL Final   nRBC 07/11/2022 0.0  0.0 - 0.2 % Final    Neutrophils Relative % 07/11/2022 53  % Final   Neutro Abs 07/11/2022 2.1  1.7 - 7.7 K/uL Final   Lymphocytes Relative 07/11/2022 31  % Final   Lymphs Abs 07/11/2022 1.2  0.7 - 4.0 K/uL Final   Monocytes Relative 07/11/2022 8  % Final   Monocytes Absolute 07/11/2022 0.3  0.1 - 1.0 K/uL Final   Eosinophils Relative 07/11/2022 6  % Final   Eosinophils Absolute 07/11/2022 0.2  0.0 - 0.5 K/uL Final   Basophils Relative 07/11/2022 1  % Final   Basophils Absolute 07/11/2022 0.1  0.0 - 0.1 K/uL Final   Immature Granulocytes 07/11/2022 1  % Final   Abs Immature Granulocytes 07/11/2022 0.02  0.00 - 0.07 K/uL Final   Performed at [facility], [address], [place], [place] [address]   Opiates 07/11/2022 NONE DETECTED  NONE DETECTED Final   Cocaine 07/11/2022 NONE DETECTED  NONE DETECTED Final   Benzodiazepines 07/11/2022 NONE DETECTED  NONE DETECTED Final   Amphetamines 07/11/2022 NONE DETECTED  NONE DETECTED Final   Tetrahydrocannabinol 07/11/2022 POSITIVE (A)  NONE DETECTED Final   Barbiturates 07/11/2022 NONE DETECTED  NONE DETECTED Final   Comment: (NOTE) DRUG SCREEN FOR MEDICAL PURPOSES ONLY.  IF CONFIRMATION IS NEEDED FOR ANY PURPOSE, NOTIFY LAB WITHIN 5 DAYS.  LOWEST DETECTABLE LIMITS FOR  URINE DRUG SCREEN Drug Class                     Cutoff (ng/mL) Amphetamine and metabolites    1000 Barbiturate and metabolites    200 Benzodiazepine                 200 Tricyclics and metabolites     300 Opiates and metabolites        300 Cocaine and metabolites        300 THC                            50 Performed at  [facility], [address] [place], [place] [address]   Admission on 06/09/2022, Discharged on 06/10/2022  Component Date Value Ref Range Status   SARS Coronavirus 2 by RT PCR 06/09/2022 NEGATIVE  NEGATIVE Final   Comment: (NOTE) SARS-CoV-2 target nucleic acids are NOT DETECTED.  The SARS-CoV-2 RNA is generally detectable in upper  respiratory specimens during the acute phase of infection. The lowest concentration of SARS-CoV-2 viral copies this assay can detect is 138 copies/mL. A negative result does not preclude SARS-Cov-2 infection and should not be used as the sole basis for treatment or other patient management decisions. A negative result may occur with  improper specimen collection/handling, submission of specimen other than nasopharyngeal swab, presence of viral mutation(s) within the areas targeted by this assay, and inadequate number of viral copies(<138 copies/mL). A negative result must be combined with clinical observations, patient history, and epidemiological information. The expected result is Negative.  Fact Sheet for Patients:  BloggerCourse.com  Fact Sheet for Healthcare Providers:  SeriousBroker.it  This test is no                          t yet approved or cleared by the [place] FDA and  has been authorized for detection and/or diagnosis of SARS-CoV-2 by FDA under an Emergency Use Authorization (EUA). This EUA will remain  in effect (meaning this test can be used) for the duration of the COVID-19 declaration under Section 564(b)(1) of the Act, 21 U.S.C.section 360bbb-3(b)(1), unless the authorization is terminated  or revoked sooner.       Influenza A by PCR 06/09/2022 NEGATIVE  NEGATIVE Final   Influenza B by PCR 06/09/2022 NEGATIVE  NEGATIVE Final   Comment: (NOTE) The Xpert Xpress SARS-CoV-2/FLU/RSV plus assay is intended as an aid in the diagnosis of influenza from Nasopharyngeal swab specimens and should not be used as a sole basis for treatment. Nasal washings and aspirates are unacceptable for Xpert Xpress SARS-CoV-2/FLU/RSV testing.  Fact Sheet for Patients: BloggerCourse.com  Fact Sheet for Healthcare Providers: SeriousBroker.it  This test is not yet approved or  cleared by the [place] FDA and has been authorized for detection and/or diagnosis of SARS-CoV-2 by FDA under an Emergency Use Authorization (EUA). This EUA will remain in effect (meaning this test can be used) for the duration of the COVID-19 declaration under Section 564(b)(1) of the Act, 21 U.S.C. section 360bbb-3(b)(1), unless the authorization is terminated or revoked.  Performed at [facility], [address] [place], [place] [address]    WBC 06/09/2022 4.5  4.0 - 10.5 K/uL Final   RBC 06/09/2022 4.11 (L)  4.22 - 5.81 MIL/uL Final   Hemoglobin 06/09/2022 13.1  13.0 - 17.0 g/dL Final   HCT 18/56/3149 37.4 (  L)  39.0 - 52.0 % Final   MCV 06/09/2022 91.0  80.0 - 100.0 fL Final   MCH 06/09/2022 31.9  26.0 - 34.0 pg Final   MCHC 06/09/2022 35.0  30.0 - 36.0 g/dL Final   RDW 06/09/2022 14.0  11.5 - 15.5 % Final   Platelets 06/09/2022 222  150 - 400 K/uL Final   nRBC 06/09/2022 0.0  0.0 - 0.2 % Final   Neutrophils Relative % 06/09/2022 51  % Final   Neutro Abs 06/09/2022 2.3  1.7 - 7.7 K/uL Final   Lymphocytes Relative 06/09/2022 34  % Final   Lymphs Abs 06/09/2022 1.5  0.7 - 4.0 K/uL Final   Monocytes Relative 06/09/2022 7  % Final   Monocytes Absolute 06/09/2022 0.3  0.1 - 1.0 K/uL Final   Eosinophils Relative 06/09/2022 6  % Final   Eosinophils Absolute 06/09/2022 0.3  0.0 - 0.5 K/uL Final   Basophils Relative 06/09/2022 2  % Final   Basophils Absolute 06/09/2022 0.1  0.0 - 0.1 K/uL Final   Immature Granulocytes 06/09/2022 0  % Final   Abs Immature Granulocytes 06/09/2022 0.01  0.00 - 0.07 K/uL Final   Performed at [facility], [address] [place], [place] [address]   Sodium 06/09/2022 137  135 - 145 mmol/L Final   Potassium 06/09/2022 3.4 (L)  3.5 - 5.1 mmol/L Final   Chloride 06/09/2022 107  98 - 111 mmol/L Final   CO2 06/09/2022 21 (L)  22 - 32 mmol/L Final   Glucose, Bld 06/09/2022 79  70 - 99 mg/dL Final   Glucose reference range applies only to  samples taken after fasting for at least 8 hours.   BUN 06/09/2022 14  8 - 23 mg/dL Final   Creatinine, Ser 06/09/2022 1.24  0.61 - 1.24 mg/dL Final   Calcium 06/09/2022 10.4 (H)  8.9 - 10.3 mg/dL Final   Total Protein 06/09/2022 5.8 (L)  6.5 - 8.1 g/dL Final   Albumin 06/09/2022 3.5  3.5 - 5.0 g/dL Final   AST 06/09/2022 20  15 - 41 U/L Final   ALT 06/09/2022 16  0 - 44 U/L Final   Alkaline Phosphatase 06/09/2022 56  38 - 126 U/L Final   Total Bilirubin 06/09/2022 0.3  0.3 - 1.2 mg/dL Final   GFR, Estimated 06/09/2022 >60  >60 mL/min Final   Comment: (NOTE) Calculated using the CKD-EPI Creatinine Equation (2021)    Anion gap 06/09/2022 9  5 - 15 Final   Performed at [facility] [address] [place], [place] [address]   Alcohol, Ethyl (B) 06/09/2022 182 (H)  <10 mg/dL Final   Comment: (NOTE) Lowest detectable limit for serum alcohol is 10 mg/dL.  For medical purposes only. Performed at [place] [facility], [place] [address], [place], [place] [address]    TSH 06/09/2022 2.313  0.350 - 4.500 uIU/mL Final   Comment: Performed by a 3rd Generation assay with a functional sensitivity of <=0.01 uIU/mL. Performed at [facility], [place] [address] [place], [place] [address]    POC Amphetamine UR 06/09/2022 None Detected  NONE DETECTED (Cut Off Level 1000 ng/mL) Final   POC Secobarbital (BAR) 06/09/2022 None Detected  NONE DETECTED (Cut Off Level 300 ng/mL) Final   POC Buprenorphine (BUP) 06/09/2022 None Detected  NONE DETECTED (Cut Off Level 10 ng/mL) Final   POC Oxazepam (BZO) 06/09/2022 None Detected  NONE DETECTED (Cut Off Level 300 ng/mL) Final  POC Cocaine UR 06/09/2022 Positive (A)  NONE DETECTED (Cut Off Level 300 ng/mL) Final   POC Methamphetamine UR 06/09/2022 None Detected  NONE DETECTED (Cut Off Level 1000 ng/mL) Final   POC Morphine 06/09/2022 None Detected  NONE DETECTED (Cut Off Level 300 ng/mL) Final   POC Methadone UR 06/09/2022 None Detected  NONE DETECTED (Cut  Off Level 300 ng/mL) Final   POC Oxycodone UR 06/09/2022 None Detected  NONE DETECTED (Cut Off Level 100 ng/mL) Final   POC Marijuana UR 06/09/2022 Positive (A)  NONE DETECTED (Cut Off Level 50 ng/mL) Final   SARSCOV2ONAVIRUS 2 AG 06/09/2022 NEGATIVE  NEGATIVE Final   Comment: (NOTE) SARS-CoV-2 antigen NOT DETECTED.   Negative results are presumptive.  Negative results do not preclude SARS-CoV-2 infection and should not be used as the sole basis for treatment or other patient management decisions, including infection  control decisions, particularly in the presence of clinical signs and  symptoms consistent with COVID-19, or in those who have been in contact with the virus.  Negative results must be combined with clinical observations, patient history, and epidemiological information. The expected result is Negative.  Fact Sheet for Patients: https://www.jennings-kim.com/  Fact Sheet for Healthcare Providers: https://alexander-rogers.biz/  This test is not yet approved or cleared by the [place] FDA and  has been authorized for detection and/or diagnosis of SARS-CoV-2 by FDA under an Emergency Use Authorization (EUA).  This EUA will remain in effect (meaning this test can be used) for the duration of  the COV                          ID-19 declaration under Section 564(b)(1) of the Act, 21 U.S.C. section 360bbb-3(b)(1), unless the authorization is terminated or revoked sooner.    Admission on 04/25/2022, Discharged on 04/28/2022  Component Date Value Ref Range Status   SARS Coronavirus 2 by RT PCR 04/25/2022 NEGATIVE  NEGATIVE Final   Comment: (NOTE) SARS-CoV-2 target nucleic acids are NOT DETECTED.  The SARS-CoV-2 RNA is generally detectable in upper respiratory specimens during the acute phase of infection. The lowest concentration of SARS-CoV-2 viral copies this assay can detect is 138 copies/mL. A negative result does not preclude  SARS-Cov-2 infection and should not be used as the sole basis for treatment or other patient management decisions. A negative result may occur with  improper specimen collection/handling, submission of specimen other than nasopharyngeal swab, presence of viral mutation(s) within the areas targeted by this assay, and inadequate number of viral copies(<138 copies/mL). A negative result must be combined with clinical observations, patient history, and epidemiological information. The expected result is Negative.  Fact Sheet for Patients:  BloggerCourse.com  Fact Sheet for Healthcare Providers:  SeriousBroker.it  This test is no                          t yet approved or cleared by the [place] FDA and  has been authorized for detection and/or diagnosis of SARS-CoV-2 by FDA under an Emergency Use Authorization (EUA). This EUA will remain  in effect (meaning this test can be used) for the duration of the COVID-19 declaration under Section 564(b)(1) of the Act, 21 U.S.C.section 360bbb-3(b)(1), unless the authorization is terminated  or revoked sooner.       Influenza A by PCR 04/25/2022 NEGATIVE  NEGATIVE Final   Influenza B by PCR 04/25/2022 NEGATIVE  NEGATIVE Final   Comment: (NOTE) The  Xpert Xpress SARS-CoV-2/FLU/RSV plus assay is intended as an aid in the diagnosis of influenza from Nasopharyngeal swab specimens and should not be used as a sole basis for treatment. Nasal washings and aspirates are unacceptable for Xpert Xpress SARS-CoV-2/FLU/RSV testing.  Fact Sheet for Patients: BloggerCourse.com  Fact Sheet for Healthcare Providers: SeriousBroker.it  This test is not yet approved or cleared by the [place] FDA and has been authorized for detection and/or diagnosis of SARS-CoV-2 by FDA under an Emergency Use Authorization (EUA). This EUA will remain in effect  (meaning this test can be used) for the duration of the COVID-19 declaration under Section 564(b)(1) of the Act, 21 U.S.C. section 360bbb-3(b)(1), unless the authorization is terminated or revoked.  Performed at [facility], [address] [place], [place] [address]    TSH 04/25/2022 3.275  0.350 - 4.500 uIU/mL Final   Comment: Performed by a 3rd Generation assay with a functional sensitivity of <=0.01 uIU/mL. Performed at [facility], [address] [place], [place] [address]    Alcohol, Ethyl (B) 04/25/2022 146 (H)  <10 mg/dL Final   Comment: (NOTE) Lowest detectable limit for serum alcohol is 10 mg/dL.  For medical purposes only. Performed at [facility], [address], [place], [place] [address]    POC Amphetamine UR 04/26/2022 None Detected  NONE DETECTED (Cut Off Level 1000 ng/mL) Final   POC Secobarbital (BAR) 04/26/2022 None Detected  NONE DETECTED (Cut Off Level 300 ng/mL) Final   POC Buprenorphine (BUP) 04/26/2022 None Detected  NONE DETECTED (Cut Off Level 10 ng/mL) Final   POC Oxazepam (BZO) 04/26/2022 None Detected  NONE DETECTED (Cut Off Level 300 ng/mL) Final   POC Cocaine UR 04/26/2022 Positive (A)  NONE DETECTED (Cut Off Level 300 ng/mL) Final   POC Methamphetamine UR 04/26/2022 None Detected  NONE DETECTED (Cut Off Level 1000 ng/mL) Final   POC Morphine 04/26/2022 None Detected  NONE DETECTED (Cut Off Level 300 ng/mL) Final   POC Methadone UR 04/26/2022 None Detected  NONE DETECTED (Cut Off Level 300 ng/mL) Final   POC Oxycodone UR 04/26/2022 None Detected  NONE DETECTED (Cut Off Level 100 ng/mL) Final   POC Marijuana UR 04/26/2022 Positive (A)  NONE DETECTED (Cut Off Level 50 ng/mL) Final   SARSCOV2ONAVIRUS 2 AG 04/25/2022 NEGATIVE  NEGATIVE Final   Comment: (NOTE) SARS-CoV-2 antigen NOT DETECTED.   Negative results are presumptive.  Negative results do not preclude SARS-CoV-2 infection and should not be used as the sole basis  for treatment or other patient management decisions, including infection  control decisions, particularly in the presence of clinical signs and  symptoms consistent with COVID-19, or in those who have been in contact with the virus.  Negative results must be combined with clinical observations, patient history, and epidemiological information. The expected result is Negative.  Fact Sheet for Patients: https://www.jennings-kim.com/  Fact Sheet for Healthcare Providers: https://alexander-rogers.biz/  This test is not yet approved or cleared by the [place] FDA and  has been authorized for detection and/or diagnosis of SARS-CoV-2 by FDA under an Emergency Use Authorization (EUA).  This EUA will remain in effect (meaning this test can be used) for the duration of  the COV                          ID-19 declaration under Section 564(b)(1) of the Act, 21 U.S.C. section 360bbb-3(b)(1), unless the authorization is terminated or revoked sooner.  Total Protein 04/27/2022 5.8 (L)  6.5 - 8.1 g/dL Final   Albumin 16/07/9603 3.5  3.5 - 5.0 g/dL Final   AST 54/06/8118 21  15 - 41 U/L Final   ALT 04/27/2022 14  0 - 44 U/L Final   Alkaline Phosphatase 04/27/2022 58  38 - 126 U/L Final   Total Bilirubin 04/27/2022 0.6  0.3 - 1.2 mg/dL Final   Bilirubin, Direct 04/27/2022 0.2  0.0 - 0.2 mg/dL Final   Indirect Bilirubin 04/27/2022 0.4  0.3 - 0.9 mg/dL Final   Performed at [facility], [address], [place], [place] [address]   Fibrosis Score 04/27/2022 0.11  0.00 - 0.21 Final   Fibrosis Stage 04/27/2022  No fibrosis   Final   Comment: Comment                    F0     Necroinflammat Activity Score 04/27/2022 0.03  0.00 - 0.17 Final   Necroinflammat Activity Grade 04/27/2022 No activity   Final   Comment: SEE COMMENT RESULT:A0    ALPHA 2-MACROGLOBULINS, QN 04/27/2022 131  110 - 276 mg/dL Final   Haptoglobin 95/62/1308 69  32 - 363 mg/dL Final    Apolipoprotein A-1 04/27/2022 162  101 - 178 mg/dL Final   Bilirubin, Total 04/27/2022 0.3  0.0 - 1.2 mg/dL Final   GGT 65/78/4696 19  0 - 65 IU/L Final   ALT (SGPT) P5P 04/27/2022 14  0 - 55 IU/L Final   Interpretations: 04/27/2022 Comment   Final   Comment: (NOTE) Quantitative results of 6 biochemical tests are analyzed using a computational algorithm to provide a quantitative surrogate marker (0.0-1.0) for liver fibrosis (METAVIR F0-F4) and for necroinflammatory activity (METAVIR A0-A3).    Fibrosis Scoring: 04/27/2022 Comment   Final   Comment: (NOTE)     <=0.21 = Stage F0 - No fibrosis 0.21 - 0.27 = Stage F0 - F1 0.27 - 0.31 = Stage F1 - Portal fibrosis 0.31 - 0.48 = Stage F1 - F2 0.48 - 0.58 = Stage F2 - Bridging fibrosis with few septa 0.58 - 0.72 = Stage F3 - Bridging fibrosis with many septa 0.72 - 0.74 = Stage F3 - F4      >0.74 = Stage F4 - Cirrhosis    Necroinflamm Activity Scoring: 04/27/2022 Comment   Final   Comment: (NOTE)      <0.17 = Grade A0 - No Activity 0.17 - 0.29 = Grade A0 - A1 0.29 - 0.36 = Grade A1 - Minimal activity 0.36 - 0.52 = Grade A1 - A2 0.52 - 0.60 = Grade A2 - Moderate activity 0.60 - 0.62 = Grade A2 - A3      >0.62 = Grade A3 - Severe activity    Limitations: 04/27/2022 Comment   Final   Comment: (NOTE) The negative predictive value of a Fibrotest score <0.31 (absence of clinically significant fibrosis) was 85% when compared to liver biopsy in 1,270 HCV infected patients with a 38% prevalence of significant liver fibrosis (F2, 3 or 4). The positive predictive value of a Fibro-test score >0.48 (F2, 3, 4) was 61% in that same patient cohort. HCV FibroSURE is not recommended in patients with [place] Disease, acute hemolysis (e.g. HCV ribavirin therapy mediated hemolysis) acute hepa-titis of the liver, extra-hepatic cholestasis, transplant patients, and/or renal insufficiency patients.  Any of these clinical situations may lead to inaccurate  quantitative predictions of fibrosis and necroinflammatory activity in the liver.    Comment  04/27/2022 Comment   Final   Comment: (NOTE) This test was developed and its performance characteristics determined by [facility].  It has not been cleared or approved by the Food and Drug Administration.  The FDA has determined that such clearance or approval is not necessary. For questions regarding this report please contact customer service at [phone]. Performed At: [name] [address] [place], [place] [id number] Jolene Schimke MD GN:[id number]    HCV Quantitative 04/27/2022 HCV Not Detected  >50 IU/mL Final   Test Information 04/27/2022 Comment   Final   Comment: (NOTE) The quantitative range of this assay is 15 IU/mL to 100 million IU/mL. Performed At: [facility] [address] [place], [place] [id number] Jolene Schimke MD [id number]:[id number]    AFP, Serum, Tumor Marker 04/27/2022 2.1  0.0 - 8.4 ng/mL Final   Comment: (NOTE) Roche Diagnostics Electrochemiluminescence Immunoassay (ECLIA) Values obtained with different assay methods or kits cannot be used interchangeably.  Results cannot be interpreted as absolute evidence of the presence or absence of malignant disease. This test is not interpretable in pregnant females. Performed At: [facility] [address] [place], [place] [id number] Jolene Schimke MD [id number]:[id number]   Admission on 04/15/2022, Discharged on 04/16/2022  Component Date Value Ref Range Status   SARS Coronavirus 2 by RT PCR 04/16/2022 NEGATIVE  NEGATIVE Final   Comment: (NOTE) SARS-CoV-2 target nucleic acids are NOT DETECTED.  The SARS-CoV-2 RNA is generally detectable in upper respiratory specimens during the acute phase of infection. The lowest concentration of SARS-CoV-2 viral copies this assay can detect is 138 copies/mL. A negative result does not preclude SARS-Cov-2 infection and should not be used as the sole basis for  treatment or other patient management decisions. A negative result may occur with  improper specimen collection/handling, submission of specimen other than nasopharyngeal swab, presence of viral mutation(s) within the areas targeted by this assay, and inadequate number of viral copies(<138 copies/mL). A negative result must be combined with clinical observations, patient history, and epidemiological information. The expected result is Negative.  Fact Sheet for Patients:  BloggerCourse.com  Fact Sheet for Healthcare Providers:  SeriousBroker.it  This test is no                          t yet approved or cleared by the [place] FDA and  has been authorized for detection and/or diagnosis of SARS-CoV-2 by FDA under an Emergency Use Authorization (EUA). This EUA will remain  in effect (meaning this test can be used) for the duration of the COVID-19 declaration under Section 564(b)(1) of the Act, 21 U.S.C.section 360bbb-3(b)(1), unless the authorization is terminated  or revoked sooner.       Influenza A by PCR 04/16/2022 NEGATIVE  NEGATIVE Final   Influenza B by PCR 04/16/2022 NEGATIVE  NEGATIVE Final   Comment: (NOTE) The Xpert Xpress SARS-CoV-2/FLU/RSV plus assay is intended as an aid in the diagnosis of influenza from Nasopharyngeal swab specimens and should not be used as a sole basis for treatment. Nasal washings and aspirates are unacceptable for Xpert Xpress SARS-CoV-2/FLU/RSV testing.  Fact Sheet for Patients: BloggerCourse.com  Fact Sheet for Healthcare Providers: SeriousBroker.it  This test is not yet approved or cleared by the [place] FDA and has been authorized for detection and/or diagnosis of SARS-CoV-2 by FDA under an Emergency Use Authorization (EUA). This EUA will remain in effect (meaning this test can be used) for the duration of the COVID-19  declaration  under Section 564(b)(1) of the Act, 21 U.S.C. section 360bbb-3(b)(1), unless the authorization is terminated or revoked.  Performed at [facility], [address], [place], [place] [address]    WBC 04/16/2022 5.3  4.0 - 10.5 K/uL Final   RBC 04/16/2022 4.96  4.22 - 5.81 MIL/uL Final   Hemoglobin 04/16/2022 15.2  13.0 - 17.0 g/dL Final   HCT 19/14/7829 44.7  39.0 - 52.0 % Final   MCV 04/16/2022 90.1  80.0 - 100.0 fL Final   MCH 04/16/2022 30.6  26.0 - 34.0 pg Final   MCHC 04/16/2022 34.0  30.0 - 36.0 g/dL Final   RDW 56/21/3086 12.7  11.5 - 15.5 % Final   Platelets 04/16/2022 190  150 - 400 K/uL Final   nRBC 04/16/2022 0.0  0.0 - 0.2 % Final   Neutrophils Relative % 04/16/2022 51  % Final   Neutro Abs 04/16/2022 2.7  1.7 - 7.7 K/uL Final   Lymphocytes Relative 04/16/2022 31  % Final   Lymphs Abs 04/16/2022 1.7  0.7 - 4.0 K/uL Final   Monocytes Relative 04/16/2022 10  % Final   Monocytes Absolute 04/16/2022 0.5  0.1 - 1.0 K/uL Final   Eosinophils Relative 04/16/2022 5  % Final   Eosinophils Absolute 04/16/2022 0.3  0.0 - 0.5 K/uL Final   Basophils Relative 04/16/2022 2  % Final   Basophils Absolute 04/16/2022 0.1  0.0 - 0.1 K/uL Final   Immature Granulocytes 04/16/2022 1  % Final   Abs Immature Granulocytes 04/16/2022 0.03  0.00 - 0.07 K/uL Final   Performed at [facility], [address], [place], [place] [address]   Sodium 04/16/2022 137  135 - 145 mmol/L Final   Potassium 04/16/2022 4.5  3.5 - 5.1 mmol/L Final   Chloride 04/16/2022 104  98 - 111 mmol/L Final   CO2 04/16/2022 27  22 - 32 mmol/L Final   Glucose, Bld 04/16/2022 94  70 - 99 mg/dL Final   Glucose reference range applies only to samples taken after fasting for at least 8 hours.   BUN 04/16/2022 12  8 - 23 mg/dL Final   Creatinine, Ser 04/16/2022 1.08  0.61 - 1.24 mg/dL Final   Calcium 96/29/5284 10.2  8.9 - 10.3 mg/dL Final   Total Protein 13/24/4010 6.4 (L)  6.5 - 8.1 g/dL Final   Albumin  27/25/3664 4.1  3.5 - 5.0 g/dL Final   AST 40/34/7425 22  15 - 41 U/L Final   ALT 04/16/2022 19  0 - 44 U/L Final   Alkaline Phosphatase 04/16/2022 61  38 - 126 U/L Final   Total Bilirubin 04/16/2022 0.7  0.3 - 1.2 mg/dL Final   GFR, Estimated 04/16/2022 >60  >60 mL/min Final   Comment: (NOTE) Calculated using the CKD-EPI Creatinine Equation (2021)    Anion gap 04/16/2022 6  5 - 15 Final   Performed at [facility], [address], [place], [place] [address]   Hgb A1c MFr Bld 04/16/2022 5.6  4.8 - 5.6 % Final   Comment: (NOTE) Pre diabetes:          5.7%-6.4%  Diabetes:              >6.4%  Glycemic control for   <7.0% adults with diabetes    Mean Plasma Glucose 04/16/2022 114.02  mg/dL Final   Performed at [facility], [address], [place], [place] [address]   Alcohol, Ethyl (B) 04/16/2022 201 (  H)  <10 mg/dL Final   Comment: (NOTE) Lowest detectable limit for serum alcohol is 10 mg/dL.  For medical purposes only. Performed at [facility], [address], [place], [place] [address]    TSH 04/16/2022 2.894  0.350 - 4.500 uIU/mL Final   Comment: Performed by a 3rd Generation assay with a functional sensitivity of <=0.01 uIU/mL. Performed at [facility], [address] [place], [place] [address]    POC Amphetamine UR 04/15/2022 None Detected  NONE DETECTED (Cut Off Level 1000 ng/mL) Final   POC Secobarbital (BAR) 04/15/2022 None Detected  NONE DETECTED (Cut Off Level 300 ng/mL) Final   POC Buprenorphine (BUP) 04/15/2022 None Detected  NONE DETECTED (Cut Off Level 10 ng/mL) Final   POC Oxazepam (BZO) 04/15/2022 None Detected  NONE DETECTED (Cut Off Level 300 ng/mL) Final   POC Cocaine UR 04/15/2022 Positive (A)  NONE DETECTED (Cut Off Level 300 ng/mL) Final   POC Methamphetamine UR 04/15/2022 None Detected  NONE DETECTED (Cut Off Level 1000 ng/mL) Final   POC Morphine 04/15/2022 None Detected  NONE DETECTED (Cut Off Level 300 ng/mL) Final   POC  Methadone UR 04/15/2022 None Detected  NONE DETECTED (Cut Off Level 300 ng/mL) Final   POC Oxycodone UR 04/15/2022 None Detected  NONE DETECTED (Cut Off Level 100 ng/mL) Final   POC Marijuana UR 04/15/2022 Positive (A)  NONE DETECTED (Cut Off Level 50 ng/mL) Final   SARSCOV2ONAVIRUS 2 AG 04/16/2022 NEGATIVE  NEGATIVE Final   Comment: (NOTE) SARS-CoV-2 antigen NOT DETECTED.   Negative results are presumptive.  Negative results do not preclude SARS-CoV-2 infection and should not be used as the sole basis for treatment or other patient management decisions, including infection  control decisions, particularly in the presence of clinical signs and  symptoms consistent with COVID-19, or in those who have been in contact with the virus.  Negative results must be combined with clinical observations, patient history, and epidemiological information. The expected result is Negative.  Fact Sheet for Patients: https://www.jennings-kim.com/  Fact Sheet for Healthcare Providers: https://alexander-rogers.biz/  This test is not yet approved or cleared by the [place] FDA and  has been authorized for detection and/or diagnosis of SARS-CoV-2 by FDA under an Emergency Use Authorization (EUA).  This EUA will remain in effect (meaning this test can be used) for the duration of  the COV                          ID-19 declaration under Section 564(b)(1) of the Act, 21 U.S.C. section 360bbb-3(b)(1), unless the authorization is terminated or revoked sooner.     SARSCOV2ONAVIRUS 2 AG 04/16/2022 NEGATIVE  NEGATIVE Final   Comment: (NOTE) SARS-CoV-2 antigen NOT DETECTED.   Negative results are presumptive.  Negative results do not preclude SARS-CoV-2 infection and should not be used as the sole basis for treatment or other patient management decisions, including infection  control decisions, particularly in the presence of clinical signs and  symptoms consistent with  COVID-19, or in those who have been in contact with the virus.  Negative results must be combined with clinical observations, patient history, and epidemiological information. The expected result is Negative.  Fact Sheet for Patients: https://www.jennings-kim.com/  Fact Sheet for Healthcare Providers: https://alexander-rogers.biz/  This test is not yet approved or cleared by the [place] FDA and  has been authorized for detection and/or diagnosis of SARS-CoV-2 by FDA under an Emergency Use Authorization (EUA).  This EUA will remain in effect (meaning this test can be used) for the duration of  the COV                          ID-19 declaration under Section 564(b)(1) of the Act, 21 U.S.C. section 360bbb-3(b)(1), unless the authorization is terminated or revoked sooner.      Allergies: Patient has no known allergies.  PTA Medications: (Not in a hospital admission)   Medical Decision Making  Inpatient observation  Lab Orders         Resp Panel by RT-PCR (Flu A&B, Covid) Anterior Nasal Swab         Ethanol         CBC with Differential/Platelet         Comprehensive metabolic panel         Hemoglobin A1c         Lipid panel         POCT Urine Drug Screen - (I-Screen)         POC SARS Coronavirus 2 Ag      Meds ordered this encounter  Medications   DISCONTD: acetaminophen (TYLENOL) tablet 650 mg   DISCONTD: alum & mag hydroxide-simeth (MAALOX/MYLANTA) 200-200-20 MG/5ML suspension 30 mL   DISCONTD: magnesium hydroxide (MILK OF MAGNESIA) suspension 30 mL   acetaminophen (TYLENOL) tablet 650 mg   alum & mag hydroxide-simeth (MAALOX/MYLANTA) 200-200-20 MG/5ML suspension 30 mL   thiamine (VITAMIN B1) injection 100 mg   thiamine (VITAMIN B1) tablet 100 mg   multivitamin with minerals tablet 1 tablet   LORazepam (ATIVAN) tablet 1 mg   hydrOXYzine (ATARAX) tablet 25 mg   loperamide (IMODIUM) capsule 2-4 mg   ondansetron (ZOFRAN-ODT) disintegrating  tablet 4 mg   FOLLOWED BY Linked Order Group    LORazepam (ATIVAN) tablet 1 mg    LORazepam (ATIVAN) tablet 1 mg    LORazepam (ATIVAN) tablet 1 mg    LORazepam (ATIVAN) tablet 1 mg     Recommendations  Based on my evaluation the patient appears to have an emergency medical condition for which I recommend the patient be transferred to the emergency department for further evaluation.  Sindy Guadeloupe, NP 08/16/22  4:59 AM

## 2022-08-16 NOTE — ED Triage Notes (Signed)
Pt presents to [facility] voluntarily, accompanied by [facility] sherriff department. Patient is intoxicated evidenced by garbled/slurred speech and self report. Patient reports drinking a 15 pack of beer earlier tonight. Patient is difficult to understand due to current condition. Pt did deny SI, HI at this time.

## 2022-08-16 NOTE — ED Provider Notes (Signed)
FBC/OBS ASAP Discharge Summary  Date and Time: 08/16/2022 3:24 PM  Name: Tim [name]  MRN:  [id number]   Discharge Diagnoses:  Final diagnoses:  Alcohol abuse with intoxication (HCC)  Anxious appearance  Substance abuse (HCC)    Subjective:  Tim [name] is a 66 year old male with a history of alcohol use disorder and major depressive disorder. He presented voluntarily to the [facility] with [employer] for unknown reasons. He was noted to appear inebriated and was found to have a BAL of 218. Review of his history is notable for the above diagnoses, several recent appearances to the [facility] which ended without patient engagement in a care plan. His last hospitalization for mental health reasons was in 2021.   Stay Summary:  On interview this morning the patient reports that he is unsure why he called the police. With prompting he reports he felt as though he needed some help. He requests discharge. He is noted to have a linear and logical thought process. He states that he is in the process of moving to [place] to be with his family and therefore cannot engage in residential rehab. (Note that a note from several months ago cites the same excuse). When outpatient options are discussed the patient expresses some interest. Attempted to set up an intake for [facility] at [facility] with Maeola Sarah. Unfortunately was informed that the group sessions all occur in person. The patient has no mode of transportation. There is no other medicaid accessible intensive substance abuse treatment option. Discussed with the patient obtaining medicaid transport rides and then following up to engage in the [facility] program.   The patient denied feeling depressed or down recently despite calling the police for help while inebriated. He denies ever attempting suicide and appears to have no wish for self- harm. Appropriate for D/C at this time in accordance with his wishes. He denies access to firearms.   Total Time spent with  patient: 30 minutes  Past Psychiatric History: as above Past Medical History:  Past Medical History:  Diagnosis Date   Alcoholism (HCC)    Anxiety    COPD (chronic obstructive pulmonary disease) (HCC)    Depression    Hep C w/o coma, chronic (HCC)    History of hiatal hernia    hernia rt groin   Mental health disorder    Scoliosis    Thyroid disease     Past Surgical History:  Procedure Laterality Date   INGUINAL HERNIA REPAIR Right 10/09/2021   Procedure: HERNIA REPAIR INGUINAL INCARCERATED;  Surgeon: Harriette Bouillon, MD;  Location: [facility];  Service: General;  Laterality: Right;   SKIN CANCER EXCISION     THYROID SURGERY     Family History:  Family History  Problem Relation Age of Onset   Dementia Mother    Family Psychiatric History: per H and P Social History:  Social History   Substance and Sexual Activity  Alcohol Use Yes     Social History   Substance and Sexual Activity  Drug Use Yes   Types: Marijuana    Social History   Socioeconomic History   Marital status: Single    Spouse name: Not on file   Number of children: Not on file   Years of education: Not on file   Highest education level: Not on file  Occupational History   Not on file  Tobacco Use   Smoking status: Every Day    Packs/day: 0.50    Years: 40.00  Total pack years: 20.00    Types: Cigarettes   Smokeless tobacco: Never   Tobacco comments:    Patient refused  Vaping Use   Vaping Use: Never used  Substance and Sexual Activity   Alcohol use: Yes   Drug use: Yes    Types: Marijuana   Sexual activity: Never    Birth control/protection: None  Other Topics Concern   Not on file  Social History Narrative   ** Merged History Encounter **       Social Determinants of Health   Financial Resource Strain: Not on file  Food Insecurity: Not on file  Transportation Needs: Not on file  Physical Activity: Not on file  Stress: Not on file  Social Connections: Not on file   SDOH:   SDOH Screenings   Alcohol Screen: Medium Risk (02/14/2020)  Depression (PHQ2-9): High Risk (04/28/2022)  Tobacco Use: High Risk (07/10/2022)    Tobacco Cessation:  N/A, patient does not currently use tobacco products  Current Medications:  Current Facility-Administered Medications  Medication Dose Route Frequency Provider Last Rate Last Admin   acetaminophen (TYLENOL) tablet 650 mg  650 mg Oral Q6H PRN Sindy Guadeloupe, NP       alum & mag hydroxide-simeth (MAALOX/MYLANTA) 200-200-20 MG/5ML suspension 30 mL  30 mL Oral Q4H PRN Sindy Guadeloupe, NP       hydrOXYzine (ATARAX) tablet 25 mg  25 mg Oral Q6H PRN Sindy Guadeloupe, NP       loperamide (IMODIUM) capsule 2-4 mg  2-4 mg Oral PRN Sindy Guadeloupe, NP       LORazepam (ATIVAN) tablet 1 mg  1 mg Oral Q6H PRN Sindy Guadeloupe, NP       LORazepam (ATIVAN) tablet 1 mg  1 mg Oral QID Sindy Guadeloupe, NP   1 mg at 08/16/22 3716   Followed by   Melene Muller ON 08/17/2022] LORazepam (ATIVAN) tablet 1 mg  1 mg Oral TID Sindy Guadeloupe, NP       Followed by   Melene Muller ON 08/18/2022] LORazepam (ATIVAN) tablet 1 mg  1 mg Oral BID Sindy Guadeloupe, NP       Followed by   Melene Muller ON 08/20/2022] LORazepam (ATIVAN) tablet 1 mg  1 mg Oral Daily Sindy Guadeloupe, NP       multivitamin with minerals tablet 1 tablet  1 tablet Oral Daily Sindy Guadeloupe, NP   1 tablet at 08/16/22 0906   ondansetron (ZOFRAN-ODT) disintegrating tablet 4 mg  4 mg Oral Q6H PRN Sindy Guadeloupe, NP       Melene Muller ON 08/17/2022] thiamine (VITAMIN B1) tablet 100 mg  100 mg Oral Daily Sindy Guadeloupe, NP       Current Outpatient Medications  Medication Sig Dispense Refill   aspirin 81 MG chewable tablet Chew 324 mg by mouth daily as needed for headache.     escitalopram (LEXAPRO) 20 MG tablet Take 1 tablet (20 mg total) by mouth daily. 30 tablet 0   hydrOXYzine (ATARAX) 50 MG tablet Take 50 mg by mouth 3 (three) times daily.     SYMBICORT 160-4.5 MCG/ACT inhaler 2 puffs 2 (two) times daily.     traZODone (DESYREL) 100 MG  tablet Take 100 mg by mouth at bedtime.     VENTOLIN HFA 108 (90 Base) MCG/ACT inhaler Inhale 2 puffs into the lungs every 4 (four) hours as needed for wheezing or shortness of breath.      PTA Medications: (Not in a hospital admission)      04/28/2022  11:36 AM 04/27/2022   11:17 AM 09/01/2021    6:34 PM  Depression screen PHQ 2/9  Decreased Interest 3 3 0  Down, Depressed, Hopeless 3 3 0  PHQ - 2 Score 6 6 0  Altered sleeping 3 3   Tired, decreased energy 3 3   Change in appetite 1 3   Feeling bad or failure about yourself  3 3   Trouble concentrating 3 3   Moving slowly or fidgety/restless 3 3   Suicidal thoughts 0 0   PHQ-9 Score 22 24     Flowsheet Row ED from 08/16/2022 in [facility] ED from 07/10/2022 in [facility] from 06/09/2022 in [facility]  C-SSRS RISK CATEGORY No Risk No Risk Error: Q3, 4, or 5 should not be populated when Q2 is No       Musculoskeletal  Strength & Muscle Tone: within normal limits Gait & Station: normal Patient leans: N/A  Psychiatric Specialty Exam  Presentation  General Appearance:  Casual  Eye Contact: Fair  Speech: Clear and Coherent  Speech Volume: normal  Mood and Affect  Mood: Euthymic  Affect: Appropriate; Congruent   Thought Process  Thought Processes: Coherent; Goal Directed  Descriptions of Associations:Intact  Orientation:Full (Time, Place and Person)  Thought Content:Logical; WDL  Diagnosis of Schizophrenia or Schizoaffective disorder in past: No    Hallucinations:none Ideas of Reference:None  Suicidal Thoughts: none Homicidal Thoughts: none  Sensorium  Memory: Immediate Fair; Recent Fair  Judgment: poor  Insight: fair   Executive Functions  Concentration: Fair  Attention Span: Fair  Recall: [employer] of Knowledge: Fair  Language: Fair   Psychomotor Activity  Psychomotor  Activity: normal  Assets  Assets: Desire for Improvement; Resilience   Sleep  Sleep: fair    Nutritional Assessment (For OBS and FBC admissions only) Has the patient had a weight loss or gain of 10 pounds or more in the last 3 months?: No Has the patient had a decrease in food intake/or appetite?: No Does the patient have dental problems?: No Does the patient have eating habits or behaviors that may be indicators of an eating disorder including binging or inducing vomiting?: No Has the patient recently lost weight without trying?: 0 Has the patient been eating poorly because of a decreased appetite?: 0 Malnutrition Screening Tool Score: 0    Physical Exam Constitutional:      Appearance: the patient is not toxic-appearing.  Pulmonary:     Effort: Pulmonary effort is normal.  Neurological:     General: No focal deficit present.     Mental Status: the patient is alert and oriented to person, place, and time.   Review of Systems  Respiratory:  Negative for shortness of breath.   Cardiovascular:  Negative for chest pain.  Gastrointestinal:  Negative for abdominal pain, constipation, diarrhea, nausea and vomiting.  Neurological:  Negative for headaches.   Blood pressure 102/69, pulse (!) 123, temperature 98.6 F (37 C), temperature source Oral, resp. rate 16, SpO2 99 %. There is no height or weight on file to calculate BMI.  Demographic Factors:  Male, Caucasian, and Low socioeconomic status  Loss Factors: NA  Historical Factors: NA  Risk Reduction Factors:   Positive social support, Positive therapeutic relationship, and Positive coping skills or problem solving skills  Continued Clinical Symptoms:  Alcohol/Substance Abuse/Dependencies  Cognitive Features That Contribute To Risk:  None    Suicide Risk:  The patient  denied feeling depressed or down recently despite calling the police for help while inebriated. He denies ever attempting suicide and appears to  have no wish for self- harm. Appropriate for D/C at this time in accordance with his wishes.   Plan Of Care/Follow-up recommendations:  Activity: as tolerated  Diet: heart healthy  Other: -Follow-up with your outpatient psychiatric provider - information provided in the discharge instructions.   -Take your psychiatric medications as prescribed at discharge - instructions are provided to you in the discharge paperwork.  -Follow-up with outpatient primary care doctor and other specialists -for management of preventative medicine and chronic medical disease.   -Recommend abstinence from alcohol, tobacco, and other illicit drug use at discharge.   -If your psychiatric symptoms recur, worsen, or if you have side effects to your psychiatric medications, call your outpatient psychiatric provider, 911, [phone] or go to the nearest emergency department.   Disposition: self-care  Corky Sox, MD 08/16/2022, 3:24 PM

## 2022-08-16 NOTE — ED Notes (Signed)
Phone DASH pickup called and notified of pending collected STAT Labs to be transported to [facility] 

## 2022-09-08 ENCOUNTER — Emergency Department (HOSPITAL_COMMUNITY)
Admission: EM | Admit: 2022-09-08 | Discharge: 2022-09-08 | Disposition: A | Discharge status: Home / Self Care | Payer: Medicaid Other | Attending: Emergency Medicine | Admitting: Emergency Medicine

## 2022-09-08 ENCOUNTER — Other Ambulatory Visit: Payer: Self-pay

## 2022-09-08 DIAGNOSIS — Z7982 Long term (current) use of aspirin: Secondary | ICD-10-CM | POA: Diagnosis not present

## 2022-09-08 DIAGNOSIS — F1910 Other psychoactive substance abuse, uncomplicated: Secondary | ICD-10-CM | POA: Diagnosis not present

## 2022-09-08 DIAGNOSIS — Z0279 Encounter for issue of other medical certificate: Secondary | ICD-10-CM | POA: Diagnosis present

## 2022-09-08 LAB — COMPREHENSIVE METABOLIC PANEL
ALT: 22 U/L (ref 0–44)
AST: 40 U/L (ref 15–41)
Albumin: 3.7 g/dL (ref 3.5–5.0)
Alkaline Phosphatase: 65 U/L (ref 38–126)
Anion gap: 11 (ref 5–15)
BUN: 9 mg/dL (ref 8–23)
CO2: 20 mmol/L — ABNORMAL LOW (ref 22–32)
Calcium: 10.1 mg/dL (ref 8.9–10.3)
Chloride: 108 mmol/L (ref 98–111)
Creatinine, Ser: 1.04 mg/dL (ref 0.61–1.24)
GFR, Estimated: >60 mL/min (ref >60)
Glucose, Bld: 91 mg/dL (ref 70–99)
Potassium: 3.7 mmol/L (ref 3.5–5.1)
Sodium: 139 mmol/L (ref 135–145)
Total Bilirubin: 0.5 mg/dL (ref 0.3–1.2)
Total Protein: 6.2 g/dL — ABNORMAL LOW (ref 6.5–8.1)

## 2022-09-08 LAB — RAPID URINE DRUG SCREEN, HOSP PERFORMED
Amphetamines: NOT DETECTED
Barbiturates: NOT DETECTED
Benzodiazepines: NOT DETECTED
Cocaine: POSITIVE — AB
Opiates: NOT DETECTED
Tetrahydrocannabinol: POSITIVE — AB

## 2022-09-08 LAB — CBC
HCT: 44.3 % (ref 39.0–52.0)
Hemoglobin: 15.2 g/dL (ref 13.0–17.0)
MCH: 31.3 pg (ref 26.0–34.0)
MCHC: 34.3 g/dL (ref 30.0–36.0)
MCV: 91.3 fL (ref 80.0–100.0)
Platelets: 235 10*3/uL (ref 150–400)
RBC: 4.85 MIL/uL (ref 4.22–5.81)
RDW: 13.2 % (ref 11.5–15.5)
WBC: 5 10*3/uL (ref 4.0–10.5)
nRBC: 0 % (ref 0.0–0.2)

## 2022-09-08 LAB — ETHANOL: Alcohol, Ethyl (B): 15 mg/dL — ABNORMAL HIGH (ref <10)

## 2022-09-08 NOTE — Discharge Instructions (Addendum)
Substance Abuse Treatment Programs ° °Intensive Outpatient Programs °[facility]     °[address]      °[place], [place]                   °[phone]      ° °[facility] °[address] °[place], [place] °[phone] ° °[facility]     °(Inpatient and outpatient)     °[address]           °[phone]   ° °[facility] °[phone] (Suboxone and Methadone) ° °[address]      °[place], [place] [address]      °[phone]      ° °[address] °[place], [place] °852-3033 ° °[facility] (Outpatient/Inpatient, [facility])    °(insurance only) [phone]      °       °[facility] (Groups & Residential) °[place], [place] °[phone] ° °   °[facility]     °[address]     °[place], [place]      °[phone]      ° °[facility] (for caregivers and family) °[address] °[place], [place] °336-299-4655 ° ° ° ° ° °Residential Treatment Programs °[facility]      °[address], [place], [place] [address]  °[phone]      ° °[facility] °[address] [place], [place] [address] °[phone] ° °[facility]        °[phone]      ° °[facility] °[phone] ° °[facility]             °[address]                                         °[place], [place]                                                °[phone] or [phone]                              ° °[facility] [place] °[address] °[place], [address] °[phone] ° °[facility]    °[address]      °[place], [place]     °[phone]      ° °[facility] °[address] °[place], [place] °336-285-9073 ° °[facility]   °[address]     °[place], [place] [address]     °[phone]      °Admissions: 8am-3pm M-F ° °[facility] °[address] °[place],  [place] °336-227-7417 ° °[facility]: Residential Program (90 Days)   °[place], [place]      °336-725-8389 or [phone]    ° °[facility] °[place], [place] °(Walk in Hours over the weekend or by referral) ° °[facility] °[address], [place],  [address] °([phone] ° °Crisis Mobile: [facility]:  1-877-626-1772 (for crisis response 24 hours a day) °[facility] Hotline:      1-800-256-2452 °Outpatient Psychiatry and Counseling ° °[facility]: Mobile Crisis   Management 24 hours:  1-877-626-1772 ° °[facility] sliding scale fee and walk in schedule: M-F 8am-12pm/1pm-3pm °[address]  °[place], [place] [address] °[phone] ° °[facility] °[address] °[place], [place] [address] °[phone] ° °[facility] ([facility] known [facility])- new patient walk-in appointments available Monday - Friday 8am -3pm.          °[address] °[place], [place] [address] °336-676-6840 or crisis line- 336-676-6905 ° °[facility] Intensive Outpatient Therapy Program °[address] °[place], [place] [address] °336-832-9804 ° °[facility]                  °Crisis Services      °[phone]      °[address]     °[place], [place] [address]                ° °[facility]   °[facility] °[phone] °[address] °[place], [place] [address] ° ° °[facility]          °[address]  °[place], [place] [address]       °[phone] ° °[facility] °[address] °[place], [place] [address] °336-292-1510 ° °[facility]    °[address]    °[place], [place] [address]     °[phone]      ° °Parish McKinney, MD     °[address]     °[place] [address]     °336-282-1251     °  °[facility] °[address] °[place] [address] ° °[facility]     °[address]     °[place], [place]      °336-542-2076      ° °[facility] °Shamsher Ahluwalia, MD °[address] °[place], [place] [address] °[phone] ° °[facility]     °[address]     °[place], [place] [address]     °336-274-1237      ° °[facility] °[address] °[place], [place] [address] °336-854-4450 °Resources for Temporary Residential Assistance/Crisis Centers ° °DAY CENTERS °[facility] °M-F 8am-3pm   °[address] [place], [place] [address]   336-332-0824 °Services include: laundry, barbering, support groups, case management, phone  & computer access, showers, AA/NA mtgs, mental health/substance abuse nurse, job skills class, disability information, VA assistance, spiritual classes, etc.  ° °HOMELESS SHELTERS ° °[facility]     °[facility]   °[address], [place]     °336.271.5959       °       °[facility] (women and children)       °[address] °[place], [place] [address] °336-275-0820 °[email or web address] for application and process °Application Required ° °[facility]   °[address]    °[place] [address]     °[phone]       °             °[facility] °[address] °,  [address] °336.273.5572 °336-235-0363(schedule application appt.) °Application Required ° °[facility] (women only)    °[address]     °[place],  [address]     °336-884-1039      °  Intake starts 6pm daily Need valid ID, SSC, & Police report [employer] [address] [place], [place] [phone] Application Required  [facility] (men only)     [address]      [place], [place]     [phone]       [facility] (Pregnant women only) [address] [place], [place] [phone]  [facility]      [address]      [place], [place] [address]     [phone]             [facility] [address] [place], [place] [phone] 90 day commitment/SA/Application process  [facility](men only)     [address]     [place], [place]     [phone]       Check-in at [facility] [address] [place], [place] [address] [phone] Men/Women/Women and Children must be there by 7 pm  [facility] [place], [place] [phone]                  Your labs today looked okay. We have printed them out for you.

## 2022-09-08 NOTE — ED Provider Notes (Signed)
[facility] EMERGENCY DEPARTMENT Provider Note   CSN: [id number] Arrival date & time: 09/08/22  1735     History  Chief Complaint  Patient presents with   Medical Clearance    Tim [name] is a 66 y.o. male.  HPI Past medical history significant for polysubstance use, daily alcohol use, here with desire for assistance with detox.  Patient reports that he "is ready," and that he needs support.  Patient states he drinks approximately 15 beers daily.  Last drink this morning.  He denies anxiety, tremors, hallucinations.  He denies nausea, vomiting.  He denies suicidal ideation, homicidal ideation.  He reports he recently has also been using crack cocaine.    Home Medications Prior to Admission medications   Medication Sig Start Date End Date Taking? Authorizing Provider  aspirin 81 MG chewable tablet Chew 324 mg by mouth daily as needed for headache.    [provider]  escitalopram (LEXAPRO) 20 MG tablet Take 1 tablet (20 mg total) by mouth daily. 04/28/22 08/16/22  Park Pope, MD  hydrOXYzine (ATARAX) 50 MG tablet Take 50 mg by mouth 3 (three) times daily. 08/09/22   [provider]  SYMBICORT 160-4.5 MCG/ACT inhaler 2 puffs 2 (two) times daily. 06/01/21   [provider]  traZODone (DESYREL) 100 MG tablet Take 100 mg by mouth at bedtime. 06/15/22   [provider]  VENTOLIN HFA 108 (90 Base) MCG/ACT inhaler Inhale 2 puffs into the lungs every 4 (four) hours as needed for wheezing or shortness of breath. 06/15/22   [provider]      Allergies    Patient has no known allergies.    Review of Systems   Review of Systems  Physical Exam Updated Vital Signs BP 132/77   Pulse 74   Temp 98.2 F (36.8 C)   Resp 18   SpO2 97%  Physical Exam Vitals and nursing note reviewed.  Constitutional:      General: He is not in acute distress.    Appearance: He is well-developed.     Comments: Patient is alert, comfortable  appearing reclining on stretcher eating  HENT:     Head: Normocephalic and atraumatic.     Nose: Nose normal.     Mouth/Throat:     Mouth: Mucous membranes are moist.     Pharynx: Oropharynx is clear.  Eyes:     General: No scleral icterus.    Conjunctiva/sclera: Conjunctivae normal.  Cardiovascular:     Rate and Rhythm: Normal rate and regular rhythm.     Pulses: Normal pulses.     Heart sounds: Normal heart sounds. No murmur heard. Pulmonary:     Effort: Pulmonary effort is normal. No respiratory distress.     Breath sounds: Normal breath sounds.  Abdominal:     General: There is no distension.     Palpations: Abdomen is soft.     Tenderness: There is no abdominal tenderness. There is no guarding or rebound.  Musculoskeletal:        General: No swelling.     Cervical back: Neck supple.     Right lower leg: No edema.     Left lower leg: No edema.  Skin:    General: Skin is warm and dry.     Capillary Refill: Capillary refill takes less than 2 seconds.  Neurological:     Mental Status: He is alert and oriented to person, place, and time.     Sensory: No sensory  deficit.     Motor: No tremor or abnormal muscle tone.     Coordination: Coordination normal.  Psychiatric:        Mood and Affect: Mood and affect normal.        Speech: Speech normal.        Behavior: Behavior is cooperative.        Thought Content: Thought content is not paranoid. Thought content does not include homicidal or suicidal ideation.     ED Results / Procedures / Treatments   Labs (all labs ordered are listed, but only abnormal results are displayed) Labs Reviewed  COMPREHENSIVE METABOLIC PANEL - Abnormal; Notable for the following components:      Result Value   CO2 20 (*)    Total Protein 6.2 (*)    All other components within normal limits  ETHANOL - Abnormal; Notable for the following components:   Alcohol, Ethyl (B) 15 (*)    All other components within normal limits  RAPID URINE DRUG  SCREEN, HOSP PERFORMED - Abnormal; Notable for the following components:   Cocaine POSITIVE (*)    Tetrahydrocannabinol POSITIVE (*)    All other components within normal limits  CBC    EKG None  Radiology No results found.  Procedures Procedures    Medications Ordered in ED Medications - No data to display  ED Course/ Medical Decision Making/ A&P                           Medical Decision Making Patient is a 66 year old male with past medical history significant for polysubstance use, daily alcohol use presenting with desire for assistance with detoxification.  He does not have any signs or symptoms of acute withdrawal.  He denies suicidal thoughts, suicidal ideation.  His vitals are reassuring.  Plan for labs to assist with placement in rehab facility.  Amount and/or Complexity of Data Reviewed External Data Reviewed: notes. Labs: ordered. Decision-making details documented in ED Course.  Labs notable for: EtOH 15, CBC within normal limits, CMP with slightly low bicarb at 20, otherwise unremarkable.  UDS positive for cocaine and THC.  Patient's labs are overall reassuring.  He does not appear clinically intoxicated.  He is not showing signs or symptoms of acute withdrawal.  He continues to deny suicidal ideation.  He is stable for discharge.  Resources given for rehab.  Return precautions given.  I provided patient with part of his labs for assistance in admission to a rehab facility.       Social determinants of health affecting patient's care include housing instability, impaired access to primary care.  Final Clinical Impression(s) / ED Diagnoses Final diagnoses:  Polysubstance abuse [facility])    Rx / DC Orders ED Discharge Orders     None         Linward Foster, MD 09/09/22 [name]    [name], MD 09/10/22 1053

## 2022-09-08 NOTE — ED Triage Notes (Addendum)
Pt presents after losing his place to live.  States he has a brother nearby but no wife/children.  Pt reports he has been drinking daily and is "coming off a crack binge "  Pt reports he needs some "mental health help"   Denies SI or HI

## 2023-07-05 IMAGING — CR DG SHOULDER 2+V*R*
3 series · 3 of 3 positions shown · non-contrast
Comparison: January 10, 2022.

CLINICAL DATA: Right shoulder pain after fall 2 days ago.

EXAM:
RIGHT SHOULDER - 2+ VIEW

[w shoulder external right]
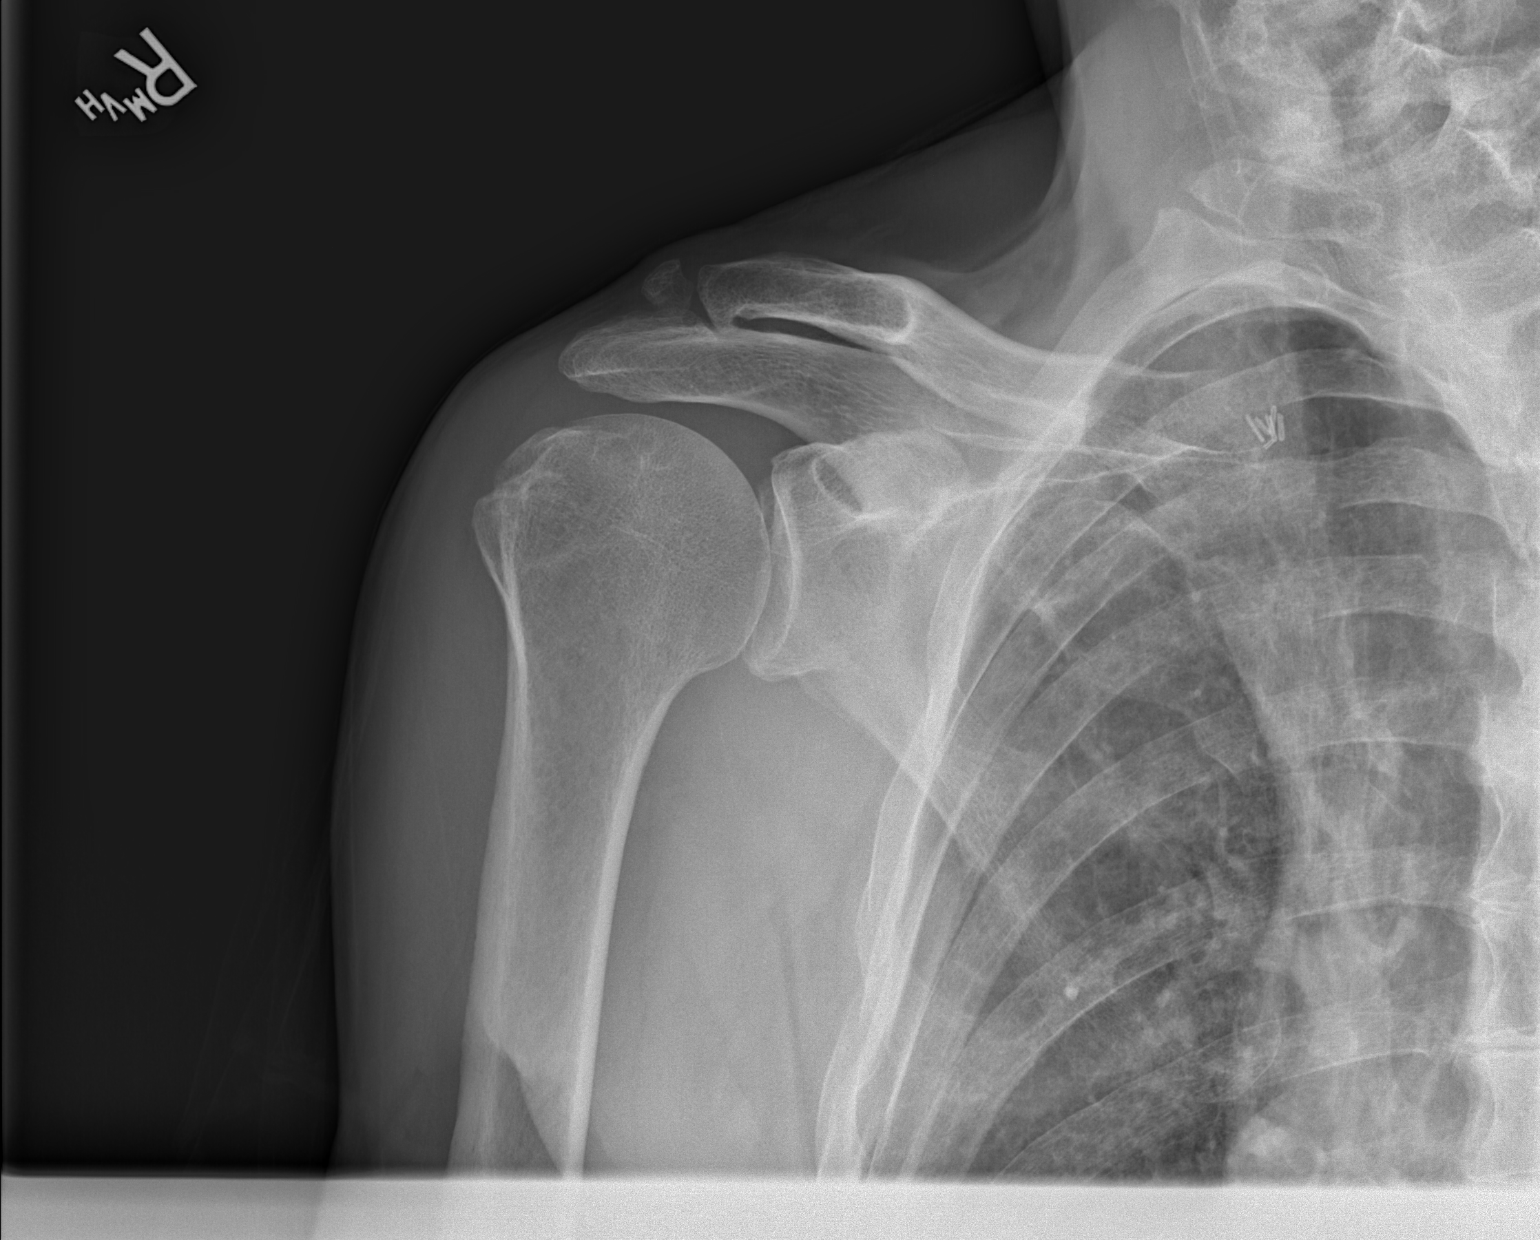

[w shoulder y-view right]
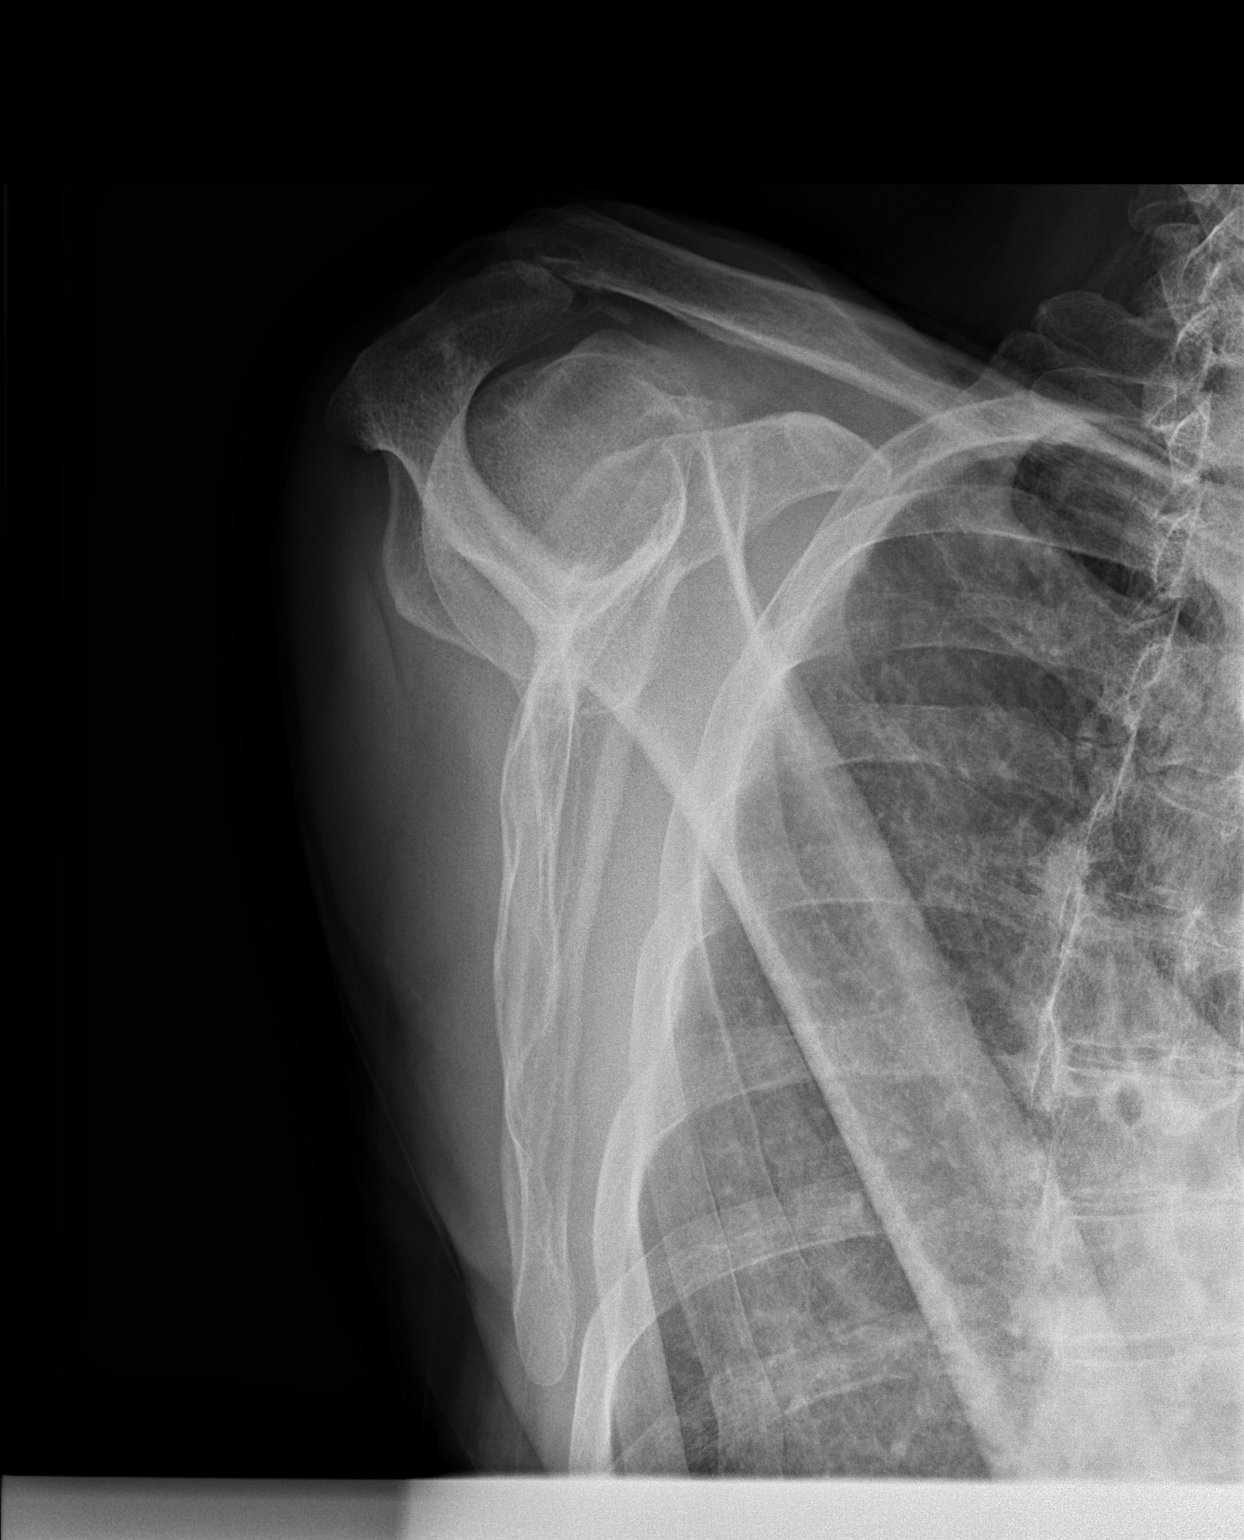

[x shoulder axillary right]
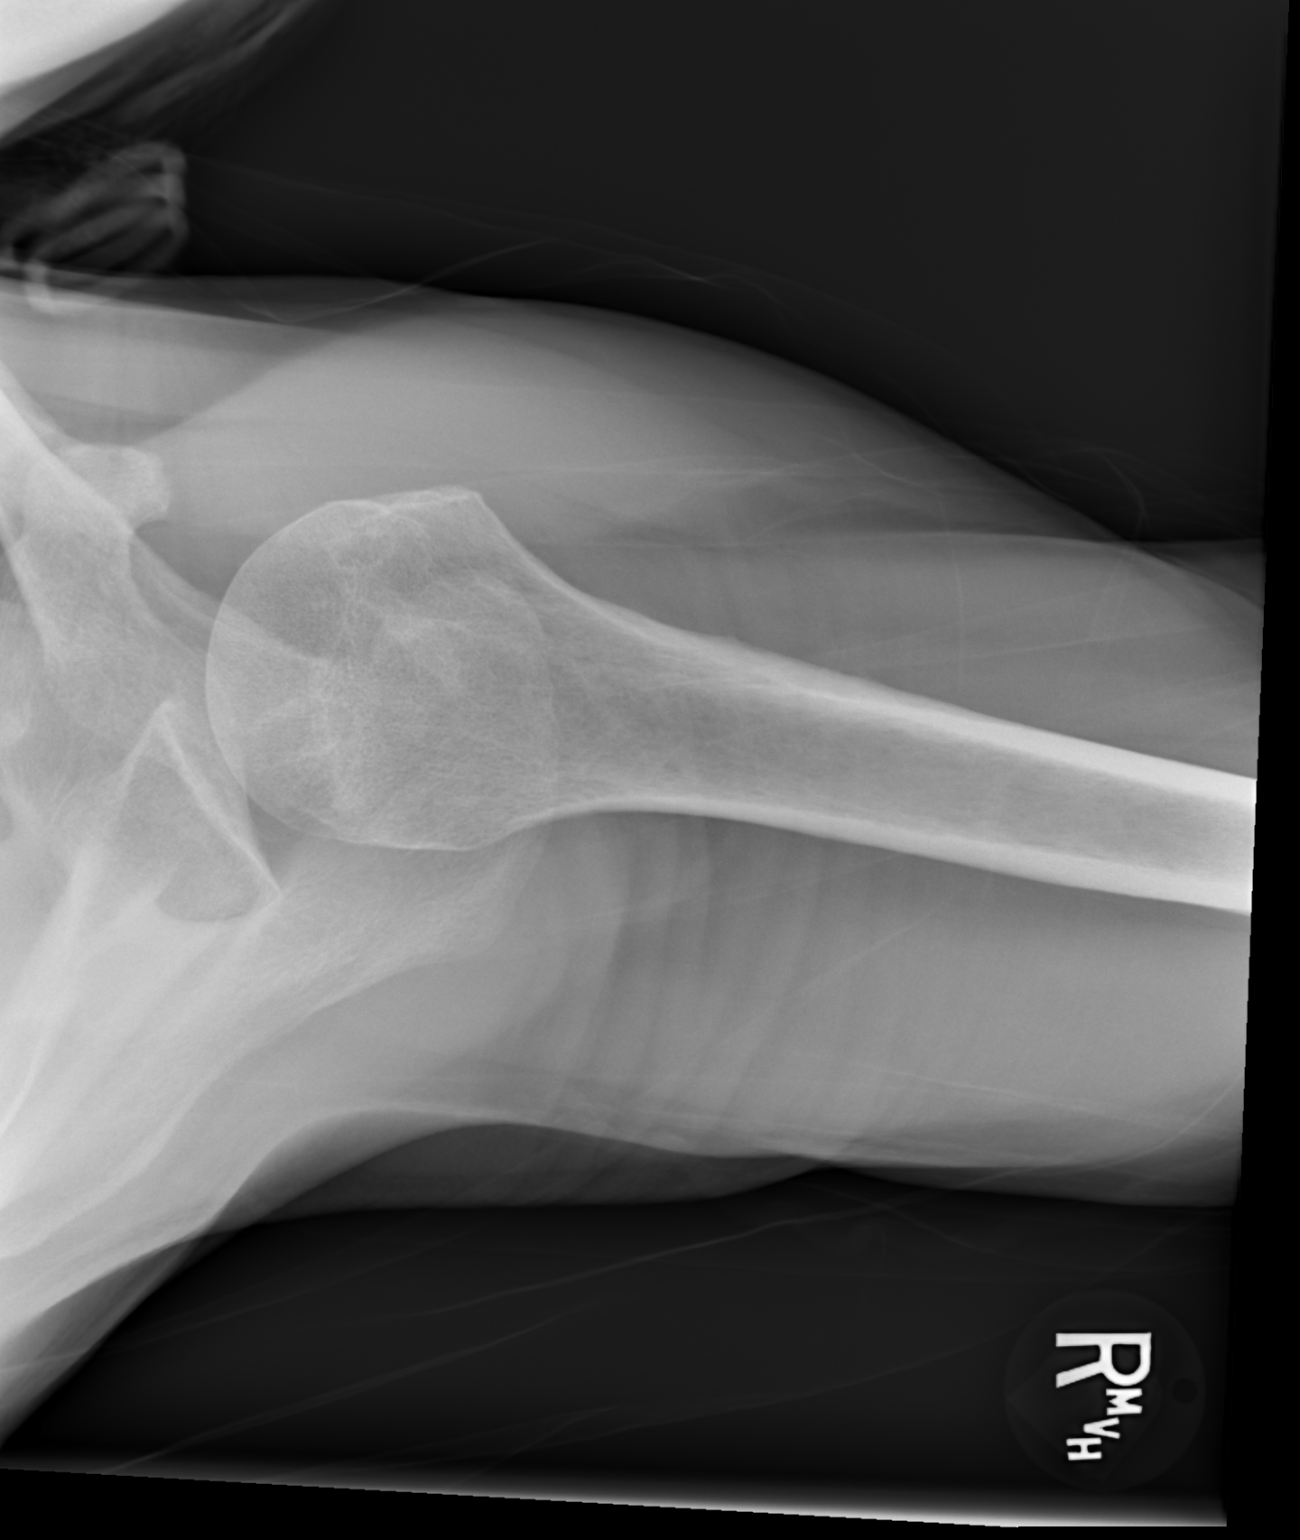

[3 of 3 positions shown; findings below may reference images not displayed]

FINDINGS: There is no evidence of fracture or dislocation. Mild degenerative
change is seen involving the right acromioclavicular joint. Soft
tissues are unremarkable.
IMPRESSION: Mild degenerative joint disease of right acromioclavicular joint. No
acute abnormality is noted.

## 2024-03-15 ENCOUNTER — Other Ambulatory Visit: Payer: Self-pay | Admitting: Pharmacist

## 2024-03-15 ENCOUNTER — Encounter: Payer: Self-pay | Admitting: Oncology

## 2024-03-15 DIAGNOSIS — C8470 Anaplastic large cell lymphoma, ALK-negative, unspecified site: Secondary | ICD-10-CM | POA: Insufficient documentation

## 2024-03-18 ENCOUNTER — Encounter: Payer: Self-pay | Admitting: Oncology

## 2024-03-19 ENCOUNTER — Telehealth: Payer: Self-pay | Admitting: Oncology

## 2024-03-19 NOTE — Telephone Encounter (Signed)
 03/19/24 Patient has decided to stay at [facility] for treatment.All appts cancelled.

## 2024-03-20 ENCOUNTER — Encounter: Payer: Self-pay | Admitting: Oncology

## 2024-03-21 ENCOUNTER — Inpatient Hospital Stay: Admitting: Oncology

## 2024-03-21 ENCOUNTER — Inpatient Hospital Stay

## 2024-03-22 ENCOUNTER — Ambulatory Visit

## 2024-03-22 ENCOUNTER — Inpatient Hospital Stay

## 2024-03-25 ENCOUNTER — Inpatient Hospital Stay

## 2024-11-17 DEATH — deceased
